# Patient Record
Sex: Male | Born: 1975
Health system: Southern US, Community
[De-identification: ages and names within clinical notes are randomized; demographics above are authoritative.]

## PROBLEM LIST (undated history)

## (undated) ENCOUNTER — Emergency Department (HOSPITAL_COMMUNITY): Admission: EM | Payer: 59 | Source: Home / Self Care

## (undated) DIAGNOSIS — C801 Malignant (primary) neoplasm, unspecified: Secondary | ICD-10-CM

## (undated) DIAGNOSIS — E119 Type 2 diabetes mellitus without complications: Secondary | ICD-10-CM

## (undated) HISTORY — DX: Type 2 diabetes mellitus without complications: E11.9

## (undated) HISTORY — PX: LEG SURGERY: SHX1003

## (undated) HISTORY — DX: Malignant (primary) neoplasm, unspecified: C80.1

---

## 2013-05-28 DIAGNOSIS — D1809 Hemangioma of other sites: Secondary | ICD-10-CM

## 2013-05-28 DIAGNOSIS — E119 Type 2 diabetes mellitus without complications: Secondary | ICD-10-CM

## 2013-05-28 HISTORY — DX: Hemangioma of other sites: D18.09

## 2013-05-28 HISTORY — DX: Type 2 diabetes mellitus without complications: E11.9

## 2014-01-03 ENCOUNTER — Emergency Department (HOSPITAL_COMMUNITY)
Admission: EM | Admit: 2014-01-03 | Discharge: 2014-01-04 | Disposition: A | Discharge status: Home / Self Care | Payer: 59 | Attending: Emergency Medicine | Admitting: Emergency Medicine

## 2014-01-03 ENCOUNTER — Encounter (HOSPITAL_COMMUNITY): Payer: Self-pay | Admitting: Emergency Medicine

## 2014-01-03 DIAGNOSIS — R200 Anesthesia of skin: Secondary | ICD-10-CM

## 2014-01-03 DIAGNOSIS — R209 Unspecified disturbances of skin sensation: Secondary | ICD-10-CM | POA: Diagnosis not present

## 2014-01-03 DIAGNOSIS — M545 Low back pain, unspecified: Secondary | ICD-10-CM

## 2014-01-03 LAB — CBC WITH DIFFERENTIAL/PLATELET
BASOS ABS: 0 10*3/uL (ref 0.0–0.1)
Basophils Relative: 0 % (ref 0–1)
EOS ABS: 0.1 10*3/uL (ref 0.0–0.7)
Eosinophils Relative: 1 % (ref 0–5)
HCT: 42 % (ref 39.0–52.0)
HEMOGLOBIN: 14.1 g/dL (ref 13.0–17.0)
LYMPHS PCT: 22 % (ref 12–46)
Lymphs Abs: 2.2 10*3/uL (ref 0.7–4.0)
MCH: 24.6 pg — ABNORMAL LOW (ref 26.0–34.0)
MCHC: 33.6 g/dL (ref 30.0–36.0)
MCV: 73.3 fL — ABNORMAL LOW (ref 78.0–100.0)
Monocytes Absolute: 0.5 10*3/uL (ref 0.1–1.0)
Monocytes Relative: 5 % (ref 3–12)
Neutro Abs: 7 10*3/uL (ref 1.7–7.7)
Neutrophils Relative %: 72 % (ref 43–77)
PLATELETS: 173 10*3/uL (ref 150–400)
RBC: 5.73 MIL/uL (ref 4.22–5.81)
RDW: 15 % (ref 11.5–15.5)
WBC: 9.8 10*3/uL (ref 4.0–10.5)

## 2014-01-03 LAB — COMPREHENSIVE METABOLIC PANEL
ALBUMIN: 4.1 g/dL (ref 3.5–5.2)
ALK PHOS: 57 U/L (ref 39–117)
ALT: 33 U/L (ref 0–53)
AST: 33 U/L (ref 0–37)
Anion gap: 14 (ref 5–15)
BUN: 11 mg/dL (ref 6–23)
CO2: 26 mEq/L (ref 19–32)
Calcium: 9.4 mg/dL (ref 8.4–10.5)
Chloride: 100 mEq/L (ref 96–112)
Creatinine, Ser: 0.94 mg/dL (ref 0.50–1.35)
GFR calc Af Amer: >90 mL/min (ref >90)
GFR calc non Af Amer: >90 mL/min (ref >90)
Glucose, Bld: 109 mg/dL — ABNORMAL HIGH (ref 70–99)
POTASSIUM: 4.6 meq/L (ref 3.7–5.3)
SODIUM: 140 meq/L (ref 137–147)
TOTAL PROTEIN: 8.3 g/dL (ref 6.0–8.3)
Total Bilirubin: 0.3 mg/dL (ref 0.3–1.2)

## 2014-01-03 NOTE — ED Notes (Signed)
Pt. reports low back pain and bilateral leg numbness for 2 weeks , denies injury / ambulatory . No urinary discomfort / no fever or chills.

## 2014-01-04 ENCOUNTER — Ambulatory Visit (HOSPITAL_COMMUNITY)
Admission: RE | Admit: 2014-01-04 | Discharge: 2014-01-04 | Disposition: A | Discharge status: Home / Self Care | Payer: 59 | Source: Ambulatory Visit | Attending: Emergency Medicine | Admitting: Emergency Medicine

## 2014-01-04 ENCOUNTER — Other Ambulatory Visit (HOSPITAL_COMMUNITY): Payer: Self-pay | Admitting: Emergency Medicine

## 2014-01-04 DIAGNOSIS — M545 Low back pain, unspecified: Secondary | ICD-10-CM | POA: Insufficient documentation

## 2014-01-04 DIAGNOSIS — R209 Unspecified disturbances of skin sensation: Secondary | ICD-10-CM | POA: Diagnosis not present

## 2014-01-04 MED ORDER — NAPROXEN 500 MG PO TABS: 500.0000 mg | ORAL_TABLET | Freq: Two times a day (BID) | ORAL | Status: DC

## 2014-01-04 NOTE — Discharge Instructions (Signed)
Back Pain: ° ° °Your back pain should be treated with medicines such as ibuprofen or aleve and this back pain should get better over the next 2 weeks.  However if you develop severe or worsening pain, low back pain with fever, numbness, weakness or inability to walk or urinate, you should return to the ER immediately.  Please follow up with your doctor this week for a recheck if still having symptoms. °Low back pain is discomfort in the lower back that may be due to injuries to muscles and ligaments around the spine.  Occasionally, it may be caused by a a problem to a part of the spine called a disc.  The pain may last several days or a week;  However, most patients get completely well in 4 weeks. ° °Self - care:  The application of heat can help soothe the pain.  Maintaining your daily activities, including walking, is encourged, as it will help you get better faster than just staying in bed. ° °Medications are also useful to help with pain control.  A commonly prescribed medications includes acetaminophen.  This medication is generally safe, though you should not take more than 8 of the extra strength (500mg) pills a day. ° °Non steroidal anti inflammatory medications including Ibuprofen and naproxen;  These medications help both pain and swelling and are very useful in treating back pain.  They should be taken with food, as they can cause stomach upset, and more seriously, stomach bleeding.   ° °Muscle relaxants:  These medications can help with muscle tightness that is a cause of lower back pain.  Most of these medications can cause drowsiness, and it is not safe to drive or use dangerous machinery while taking them. ° °You will need to follow up with  Your primary healthcare provider in 1-2 weeks for reassessment. ° °Be aware that if you develop new symptoms, such as a fever, leg weakness, difficulty with or loss of control of your urine or bowels, abdominal pain, or more severe pain, you will need to seek  medical attention and  / or return to the Emergency department. ° °If you do not have a doctor see the list below. ° °RESOURCE GUIDE ° °Chronic Pain Problems: °Contact [facility]  [phone] °Patients need to be referred by their primary care doctor. ° °Insufficient Money for Medicine: °Contact United Way:  call "211" or [facility] 271-5999. ° °No Primary Care Doctor: °- Call [facility]  832-8000 - can help you locate a primary care doctor that  accepts your insurance, provides certain services, etc. °- Physician Referral Service- 1-800-533-3463 ° °Agencies that provide inexpensive medical care: °- [facility]  832-8035 °- [facility]  832-7272 °- [facility]  [phone] °- [facility]  832-4777 °- [facility]  373-0678 °- [facility]  272-1050 ° °Medicaid-accepting [place] Providers: °- [facility]- [address] ° [phone], Mon-Fri 9am-7pm, Sat 9am-1pm °- [facility]- [address], [address] ° [phone] °- [facility]- [address], [address] ° [phone] °- [facility]- [address] [place] [address] ° [phone] °- [facility]- [address], [address], [phone] ° Only accepts [place] Medicaid patients after they have their name  applied to their card ° °Self Pay (no insurance) in [place]: °- Sickle Cell Patients: Dr Eric Dean, [facility] °   [address], [phone] °- [facility]- [address] ° [phone] °      -     [facility]- [address] °      -     [facility]- see information above (Speak to Pam H if you do not have insurance) °      -  [facility]- [address], [phone] °      -  [facility] [place]- [address],  [phone] °      -  [facility]- [address] [place] [address],  [phone] °      -  Dr Osei-Bonsu-  [address], [place], [phone] °      -  [facility]- [address], [phone] °      -  [facility]- [address] [place] [address], [phone], also [address], [phone] °      -    [facility]- [address], [phone], 1st & 3rd Saturday   every month, 10am-1pm ° °1) Find a Doctor and Pay Out of Pocket °Although you won't have to find out who is covered by your insurance plan, it is a good idea to ask around and get recommendations. You will then need to call the office and see if the doctor you have chosen will accept you as a new patient and what types of options they offer for patients who are self-pay. Some doctors offer discounts or will set up payment plans for their patients who do not have insurance, but you will need to ask so you aren't surprised when you get to your appointment. ° °2) Contact Your Local Health Department °Not all health departments have doctors that can see patients for sick visits, but many do, so it is worth a call to see if yours does. If you don't know where your local health department is, you can check in your phone book. The CDC also has a tool to help you locate your state's health department, and many state websites also have listings of all of their local health departments. ° °3) Find a Walk-in Clinic °If your illness is not likely to be very severe or complicated, you may want to try a walk in clinic. These are popping up all over the country in pharmacies, drugstores, and shopping centers. They're usually staffed by nurse practitioners or physician assistants that have been trained to treat common illnesses and complaints. They're usually fairly quick and inexpensive. However, if you have serious medical issues or chronic medical problems, these are probably not your best option ° °STD Testing °- [place] [facility] [place], [facility], [address], [place], phone [phone] or 1-877-539-9860.  Monday - Friday, call for an appointment. °- [place] [facility] [place], [facility], [address], [place], phone [phone] or 1-877-539-9860.  Monday - Friday, call for an appointment. ° °Abuse/Neglect: °- [place] Child Abuse Hotline (336) 641-3795 °- [place] Child Abuse Hotline 800-378-5315 (After Hours) ° °Emergency Shelter:  [facility] (336) 271-5985 ° °Maternity Homes: °- [facility] (336) 275-9566 °- [facility] (704) 372-4663 ° °MRSA Hotline #:   832-7006 ° °[place] Resources ° °[facility] [place]  [employer] [place] [facility] °[address]                 [address]         [address]  °[place]                                               [place]                              [place] °Phone:  349-3220                                  Phone:  [phone]                   Phone:  342-8140 ° °[facility], 342-8316 °- [place] [facility] - [facility]- 1-888-581-9988 °      -     [facility] in [place], [address],                                  336-349-4454, Insurance ° °[place] Child Abuse Hotline °(336) 342-1394 or (336) 342-3537 (After Hours) ° ° °Behavioral Health Services ° °Substance Abuse Resources: °- [facility]  336-882-2125 °- [facility] 336-784-9470 °- [facility] 336-285-9073 °- [facility] 336-845-3988 °- Residential & Outpatient Substance Abuse Program  800-659-3381 ° °Psychological Services: °- [facility]  832-9600 °- [facility]  378-7881 °- [facility], [address], [place], ACCESS LINE: 1-800-853-5163 or 336-641-4981, Http://www.[facility]/services/adult.htm ° °Dental Assistance ° °If unable to pay or  uninsured, contact:  [facility] or [facility] to become qualified for the adult dental clinic. ° °Patients with Medicaid: [facility] °[address], [phone] °[address], [phone] ° °If unable to pay, or uninsured, contact [facility] (271-5999) or [place] [facility] (641-3152 in [place], [phone] in [place]) to become qualified for the adult dental clinic ° °Other Low-Cost Community Dental Services: °- [facility]- [address], [place], [place], [address], 723-1848, Ext. 123, 2nd and 4th Thursday of the month at 6:30am.  10 clients each day by appointment, can sometimes see walk-in patients if someone does not show for an appointment. °- [facility]- [address], [place], [place], [address], [phone] °- [facility]- [address], [place], [place], [address], [phone] °- [facility]- 342-8273 °- [facility]- [phone] °- [facility]- [phone] ° ° ° ° ° ° °

## 2014-01-04 NOTE — ED Provider Notes (Signed)
CSN: [id number]     Arrival date & time 01/03/14  2113 History   First MD Initiated Contact with Patient 01/03/14 2349     Chief Complaint  Patient presents with  . Back Pain  . Numbness     (Consider location/radiation/quality/duration/timing/severity/associated sxs/prior Treatment) HPI Comments: 38 year old male, history of no past medical history, no past surgical history other than a remote leg surgery, does not smoke cigarettes. He has no history of IV drug use, no recent fevers, no history of cancer. He reports that over the last one or 2 weeks he has had the lower back tightness which becomes much worse when he gets up and walks and is associated with a gradual onset of bilateral numbness of the thighs anteriorly and medially which is persistent, nothing makes it better or worse including laying down and is not associated with weakness of the legs, urinary retention or incontinence or any other upper extremity or coordination problems. He has never had these symptoms before, nothing seems to make this better or worse, he has had a normal appetite with no abdominal distention, nausea, vomiting, fever, chills, cough, shortness of breath, diarrhea, constipation or blood in his stools. He has been seen at the urgent care for these symptoms, lab work done, patient given reassurance prior to his evaluation today. He does lift 50-75 pound boxes at work regularly, he does not recall an injury to his back  Patient is a 37 y.o. male presenting with back pain. The history is provided by the patient.  Back Pain   History reviewed. No pertinent past medical history. Past Surgical History  Procedure Laterality Date  . Leg surgery     No family history on file. History  Substance Use Topics  . Smoking status: Never Smoker   . Smokeless tobacco: Not on file  . Alcohol Use: Yes    Review of Systems  Musculoskeletal: Positive for back pain.  All other systems reviewed and are  negative.     Allergies  Review of patient's allergies indicates no known allergies.  Home Medications   Prior to Admission medications   Medication Sig Start Date End Date Taking? Authorizing Provider  naproxen (NAPROSYN) 500 MG tablet Take 1 tablet (500 mg total) by mouth 2 (two) times daily with a meal. 01/04/14   Johnna Acosta, MD   BP 137/96  Pulse 107  Temp(Src) 98.5 F (36.9 C) (Oral)  Resp 27  SpO2 98% Physical Exam  Nursing note and vitals reviewed. Constitutional: He appears well-developed and well-nourished. No distress.  HENT:  Head: Normocephalic and atraumatic.  Mouth/Throat: Oropharynx is clear and moist. No oropharyngeal exudate.  Eyes: Conjunctivae and EOM are normal. Pupils are equal, round, and reactive to light. Right eye exhibits no discharge. Left eye exhibits no discharge. No scleral icterus.  Neck: Normal range of motion. Neck supple. No JVD present. No thyromegaly present.  Cardiovascular: Normal rate, regular rhythm, normal heart sounds and intact distal pulses.  Exam reveals no gallop and no friction rub.   No murmur heard. Pulmonary/Chest: Effort normal and breath sounds normal. No respiratory distress. He has no wheezes. He has no rales.  Abdominal: Soft. Bowel sounds are normal. He exhibits no distension and no mass. There is no tenderness.  Musculoskeletal: Normal range of motion. He exhibits no edema and no tenderness.  Lymphadenopathy:    He has no cervical adenopathy.  Neurological: He is alert. Coordination normal.  Neurologic exam:  Speech clear, pupils equal round reactive to  light, extraocular movements intact  Normal peripheral visual fields Cranial nerves III through XII normal including no facial droop Follows commands, moves all extremities x4, normal strength to bilateral upper and lower extremities at all major muscle groups including grip Sensation normal to light touch and pinprick except over the proximal and mid thighs  bilaterally, normal sensation laterally and posteriorly, normal sensation below the knees, normal position sense Coordination intact, no limb ataxia,Rapid alternating movements normal No pronator drift Gait normal   Skin: Skin is warm and dry. No rash noted. No erythema.  Psychiatric: He has a normal mood and affect. His behavior is normal.    ED Course  Procedures (including critical care time) Labs Review Labs Reviewed  CBC WITH DIFFERENTIAL - Abnormal; Notable for the following:    MCV 73.3 (*)    MCH 24.6 (*)    All other components within normal limits  COMPREHENSIVE METABOLIC PANEL - Abnormal; Notable for the following:    Glucose, Bld 109 (*)    All other components within normal limits    Imaging Review No results found.    MDM   Final diagnoses:  Bilateral low back pain without sciatica  Bilateral leg numbness    The patient has no reducible tenderness over his back, he has not use IV drugs, he has normal exam of his lower extremities except for a component of numbness bilaterally. Would consider MRI of the back as an outpatient though emergent imaging is not indicated. The symptoms have been present for 2 weeks. Lab show no anemia, no rectal abnormalities, the patient can followup outpatient and has been given an MRI ordered for the morning and a referral for family doctors.  Pt expresses his understanding  Johnna Acosta, MD 01/04/14 [phone]

## 2014-01-12 ENCOUNTER — Telehealth (HOSPITAL_BASED_OUTPATIENT_CLINIC_OR_DEPARTMENT_OTHER): Payer: Self-pay | Admitting: Emergency Medicine

## 2014-01-22 ENCOUNTER — Institutional Professional Consult (permissible substitution): Payer: Self-pay | Admitting: Medical

## 2014-03-22 ENCOUNTER — Inpatient Hospital Stay (HOSPITAL_COMMUNITY)
Admission: AD | Admit: 2014-03-22 | Discharge: 2014-03-27 | DRG: 029 | Disposition: A | Discharge status: Home Health | Payer: 59 | Source: Ambulatory Visit | Attending: Internal Medicine | Admitting: Internal Medicine

## 2014-03-22 ENCOUNTER — Ambulatory Visit (INDEPENDENT_AMBULATORY_CARE_PROVIDER_SITE_OTHER): Payer: 59 | Admitting: Diagnostic Neuroimaging

## 2014-03-22 ENCOUNTER — Encounter: Payer: Self-pay | Admitting: Diagnostic Neuroimaging

## 2014-03-22 ENCOUNTER — Other Ambulatory Visit: Payer: Self-pay | Admitting: Diagnostic Neuroimaging

## 2014-03-22 ENCOUNTER — Encounter (HOSPITAL_COMMUNITY)
Admission: AD | Disposition: A | Discharge status: Home Health | Payer: Self-pay | Source: Ambulatory Visit | Attending: Internal Medicine

## 2014-03-22 ENCOUNTER — Encounter (HOSPITAL_COMMUNITY): Payer: 59 | Admitting: Certified Registered Nurse Anesthetist

## 2014-03-22 ENCOUNTER — Inpatient Hospital Stay (HOSPITAL_COMMUNITY): Payer: 59 | Admitting: Certified Registered Nurse Anesthetist

## 2014-03-22 VITALS — BP 130/87 | HR 112 | Temp 98.0°F | Resp 22 | Ht 68.0 in | Wt 210.0 lb

## 2014-03-22 DIAGNOSIS — R200 Anesthesia of skin: Secondary | ICD-10-CM

## 2014-03-22 DIAGNOSIS — R202 Paresthesia of skin: Secondary | ICD-10-CM

## 2014-03-22 DIAGNOSIS — C7949 Secondary malignant neoplasm of other parts of nervous system: Secondary | ICD-10-CM | POA: Diagnosis present

## 2014-03-22 DIAGNOSIS — D72829 Elevated white blood cell count, unspecified: Secondary | ICD-10-CM | POA: Diagnosis present

## 2014-03-22 DIAGNOSIS — Z803 Family history of malignant neoplasm of breast: Secondary | ICD-10-CM | POA: Diagnosis not present

## 2014-03-22 DIAGNOSIS — G893 Neoplasm related pain (acute) (chronic): Secondary | ICD-10-CM

## 2014-03-22 DIAGNOSIS — M6281 Muscle weakness (generalized): Secondary | ICD-10-CM | POA: Diagnosis present

## 2014-03-22 DIAGNOSIS — R739 Hyperglycemia, unspecified: Secondary | ICD-10-CM

## 2014-03-22 DIAGNOSIS — T380X5A Adverse effect of glucocorticoids and synthetic analogues, initial encounter: Secondary | ICD-10-CM | POA: Diagnosis present

## 2014-03-22 DIAGNOSIS — E119 Type 2 diabetes mellitus without complications: Secondary | ICD-10-CM | POA: Diagnosis present

## 2014-03-22 DIAGNOSIS — E669 Obesity, unspecified: Secondary | ICD-10-CM | POA: Diagnosis present

## 2014-03-22 DIAGNOSIS — E1169 Type 2 diabetes mellitus with other specified complication: Secondary | ICD-10-CM | POA: Diagnosis present

## 2014-03-22 DIAGNOSIS — M4714 Other spondylosis with myelopathy, thoracic region: Secondary | ICD-10-CM

## 2014-03-22 DIAGNOSIS — G992 Myelopathy in diseases classified elsewhere: Secondary | ICD-10-CM | POA: Diagnosis present

## 2014-03-22 DIAGNOSIS — Z6831 Body mass index (BMI) 31.0-31.9, adult: Secondary | ICD-10-CM

## 2014-03-22 DIAGNOSIS — D1809 Hemangioma of other sites: Secondary | ICD-10-CM | POA: Diagnosis present

## 2014-03-22 DIAGNOSIS — C801 Malignant (primary) neoplasm, unspecified: Secondary | ICD-10-CM

## 2014-03-22 DIAGNOSIS — D334 Benign neoplasm of spinal cord: Secondary | ICD-10-CM | POA: Diagnosis present

## 2014-03-22 DIAGNOSIS — E1165 Type 2 diabetes mellitus with hyperglycemia: Secondary | ICD-10-CM | POA: Diagnosis present

## 2014-03-22 DIAGNOSIS — R29898 Other symptoms and signs involving the musculoskeletal system: Secondary | ICD-10-CM | POA: Insufficient documentation

## 2014-03-22 DIAGNOSIS — IMO0002 Reserved for concepts with insufficient information to code with codable children: Secondary | ICD-10-CM

## 2014-03-22 DIAGNOSIS — G8220 Paraplegia, unspecified: Secondary | ICD-10-CM

## 2014-03-22 HISTORY — DX: Other spondylosis with myelopathy, thoracic region: M47.14

## 2014-03-22 HISTORY — DX: Paraplegia, unspecified: G82.20

## 2014-03-22 HISTORY — DX: Anesthesia of skin: R20.0

## 2014-03-22 HISTORY — PX: LAMINECTOMY: SHX219

## 2014-03-22 LAB — SURGICAL PCR SCREEN
MRSA, PCR: NEGATIVE
Staphylococcus aureus: NEGATIVE

## 2014-03-22 SURGERY — THORACIC LAMINECTOMY FOR TUMOR
Anesthesia: General | Laterality: Bilateral

## 2014-03-22 SURGERY — THORACIC LAMINECTOMY FOR TUMOR
Anesthesia: General | Site: Back

## 2014-03-22 MED ORDER — CEFAZOLIN SODIUM-DEXTROSE 2-3 GM-% IV SOLR
2.0000 g | INTRAVENOUS | Status: DC
  Filled 2014-03-22: qty 50

## 2014-03-22 MED ORDER — GLYCOPYRROLATE 0.2 MG/ML IJ SOLN
INTRAMUSCULAR | Status: AC
  Filled 2014-03-22: qty 3

## 2014-03-22 MED ORDER — ARTIFICIAL TEARS OP OINT
TOPICAL_OINTMENT | OPHTHALMIC | Status: AC
  Filled 2014-03-22: qty 3.5

## 2014-03-22 MED ORDER — ROCURONIUM BROMIDE 50 MG/5ML IV SOLN
INTRAVENOUS | Status: AC
  Filled 2014-03-22: qty 1

## 2014-03-22 MED ORDER — SUCCINYLCHOLINE CHLORIDE 20 MG/ML IJ SOLN
INTRAMUSCULAR | Status: AC
  Filled 2014-03-22: qty 1

## 2014-03-22 MED ORDER — FENTANYL CITRATE 0.05 MG/ML IJ SOLN
INTRAMUSCULAR | Status: AC
  Filled 2014-03-22: qty 5

## 2014-03-22 MED ORDER — DEXAMETHASONE SODIUM PHOSPHATE 10 MG/ML IJ SOLN
10.0000 mg | INTRAMUSCULAR | Status: DC
  Filled 2014-03-22: qty 1

## 2014-03-22 MED ORDER — PROPOFOL 10 MG/ML IV BOLUS
INTRAVENOUS | Status: AC
  Filled 2014-03-22: qty 20

## 2014-03-22 MED ORDER — MIDAZOLAM HCL 2 MG/2ML IJ SOLN
INTRAMUSCULAR | Status: AC
  Filled 2014-03-22: qty 2

## 2014-03-22 MED ORDER — NEOSTIGMINE METHYLSULFATE 10 MG/10ML IV SOLN
INTRAVENOUS | Status: AC
  Filled 2014-03-22: qty 1

## 2014-03-22 MED ORDER — ONDANSETRON HCL 4 MG/2ML IJ SOLN
INTRAMUSCULAR | Status: AC
  Filled 2014-03-22: qty 2

## 2014-03-22 SURGICAL SUPPLY — 71 items
BAG DECANTER FOR FLEXI CONT (MISCELLANEOUS) ×3 IMPLANT
BENZOIN TINCTURE PRP APPL 2/3 (GAUZE/BANDAGES/DRESSINGS) ×3 IMPLANT
BLADE CLIPPER SURG (BLADE) IMPLANT
BLADE SURG 11 STRL SS (BLADE) IMPLANT
BRUSH SCRUB EZ PLAIN DRY (MISCELLANEOUS) ×3 IMPLANT
BUR CUTTER 7.0 ROUND (BURR) ×3 IMPLANT
BUR MATCHSTICK NEURO 3.0 LAGG (BURR) ×3 IMPLANT
CANISTER SUCT 3000ML (MISCELLANEOUS) ×3 IMPLANT
CLOSURE WOUND 1/2 X4 (GAUZE/BANDAGES/DRESSINGS) ×1
CONT SPEC 4OZ CLIKSEAL STRL BL (MISCELLANEOUS) ×6 IMPLANT
DERMABOND ADVANCED (GAUZE/BANDAGES/DRESSINGS) ×2
DERMABOND ADVANCED .7 DNX12 (GAUZE/BANDAGES/DRESSINGS) ×1 IMPLANT
DRAPE LAPAROTOMY 100X72X124 (DRAPES) ×3 IMPLANT
DRAPE LAPAROTOMY T 102X78X121 (DRAPES) IMPLANT
DRAPE MICROSCOPE LEICA (MISCELLANEOUS) ×3 IMPLANT
DRAPE POUCH INSTRU U-SHP 10X18 (DRAPES) ×3 IMPLANT
DRAPE SURG 17X23 STRL (DRAPES) ×6 IMPLANT
DRSG OPSITE POSTOP 4X8 (GAUZE/BANDAGES/DRESSINGS) ×3 IMPLANT
ELECT REM PT RETURN 9FT ADLT (ELECTROSURGICAL) ×3
ELECTRODE REM PT RTRN 9FT ADLT (ELECTROSURGICAL) ×1 IMPLANT
GAUZE SPONGE 4X4 12PLY STRL (GAUZE/BANDAGES/DRESSINGS) ×3 IMPLANT
GAUZE SPONGE 4X4 16PLY XRAY LF (GAUZE/BANDAGES/DRESSINGS) IMPLANT
GLOVE BIO SURGEON STRL SZ 6.5 (GLOVE) ×4 IMPLANT
GLOVE BIO SURGEON STRL SZ7 (GLOVE) ×3 IMPLANT
GLOVE BIO SURGEON STRL SZ7.5 (GLOVE) ×6 IMPLANT
GLOVE BIO SURGEONS STRL SZ 6.5 (GLOVE) ×2
GLOVE ECLIPSE 9.0 STRL (GLOVE) ×3 IMPLANT
GLOVE EXAM NITRILE LRG STRL (GLOVE) IMPLANT
GLOVE EXAM NITRILE MD LF STRL (GLOVE) ×3 IMPLANT
GLOVE EXAM NITRILE XL STR (GLOVE) IMPLANT
GLOVE EXAM NITRILE XS STR PU (GLOVE) IMPLANT
GLOVE INDICATOR 6.5 STRL GRN (GLOVE) ×3 IMPLANT
GOWN STRL REUS W/ TWL LRG LVL3 (GOWN DISPOSABLE) ×2 IMPLANT
GOWN STRL REUS W/ TWL XL LVL3 (GOWN DISPOSABLE) ×1 IMPLANT
GOWN STRL REUS W/TWL 2XL LVL3 (GOWN DISPOSABLE) IMPLANT
GOWN STRL REUS W/TWL LRG LVL3 (GOWN DISPOSABLE) ×4
GOWN STRL REUS W/TWL XL LVL3 (GOWN DISPOSABLE) ×2
HEMOSTAT SURGICEL 2X14 (HEMOSTASIS) IMPLANT
KIT BASIN OR (CUSTOM PROCEDURE TRAY) ×3 IMPLANT
KIT ROOM TURNOVER OR (KITS) ×3 IMPLANT
NEEDLE HYPO 22GX1.5 SAFETY (NEEDLE) ×3 IMPLANT
NEEDLE HYPO 25X1 1.5 SAFETY (NEEDLE) ×3 IMPLANT
NEEDLE SPNL 20GX3.5 QUINCKE YW (NEEDLE) IMPLANT
NS IRRIG 1000ML POUR BTL (IV SOLUTION) ×3 IMPLANT
PACK LAMINECTOMY NEURO (CUSTOM PROCEDURE TRAY) ×3 IMPLANT
PAD EYE OVAL STERILE LF (GAUZE/BANDAGES/DRESSINGS) IMPLANT
PATTIES SURGICAL .5 X.5 (GAUZE/BANDAGES/DRESSINGS) IMPLANT
PATTIES SURGICAL .5 X3 (DISPOSABLE) ×3 IMPLANT
RUBBERBAND STERILE (MISCELLANEOUS) ×6 IMPLANT
SPONGE LAP 4X18 X RAY DECT (DISPOSABLE) ×3 IMPLANT
SPONGE SURGIFOAM ABS GEL 100 (HEMOSTASIS) ×3 IMPLANT
STAPLER VISISTAT 35W (STAPLE) IMPLANT
STRIP CLOSURE SKIN 1/2X4 (GAUZE/BANDAGES/DRESSINGS) ×2 IMPLANT
SUT BONE WAX W31G (SUTURE) IMPLANT
SUT ETHILON 4 0 PS 2 18 (SUTURE) ×3 IMPLANT
SUT NURALON 4 0 TR CR/8 (SUTURE) IMPLANT
SUT PROLENE 6 0 BV (SUTURE) IMPLANT
SUT VIC AB 0 CT1 18XCR BRD8 (SUTURE) ×2 IMPLANT
SUT VIC AB 0 CT1 8-18 (SUTURE) ×4
SUT VIC AB 2-0 CT1 18 (SUTURE) ×3 IMPLANT
SUT VIC AB 3-0 SH 8-18 (SUTURE) ×3 IMPLANT
SUT VICRYL 4-0 PS2 18IN ABS (SUTURE) IMPLANT
SYR 20ML ECCENTRIC (SYRINGE) IMPLANT
SYR CONTROL 10ML LL (SYRINGE) IMPLANT
TAPE STRIPS DRAPE STRL (GAUZE/BANDAGES/DRESSINGS) ×3 IMPLANT
TOWEL OR 17X24 6PK STRL BLUE (TOWEL DISPOSABLE) ×3 IMPLANT
TOWEL OR 17X26 10 PK STRL BLUE (TOWEL DISPOSABLE) ×3 IMPLANT
TRAY FOLEY CATH 14FRSI W/METER (CATHETERS) ×3 IMPLANT
TUBE CONNECTING 12'X1/4 (SUCTIONS)
TUBE CONNECTING 12X1/4 (SUCTIONS) IMPLANT
WATER STERILE IRR 1000ML POUR (IV SOLUTION) ×3 IMPLANT

## 2014-03-22 NOTE — Progress Notes (Addendum)
[facility]  PATIENT: Gary Conway DOB: 13-Oct-1975  REFERRING CLINICIAN: Dumonski HISTORY FROM: patient  REASON FOR VISIT: new consult    HISTORICAL  CHIEF COMPLAINT:  Chief Complaint  Patient presents with  . Establish Care    leg weakness    HISTORY OF PRESENT ILLNESS:   38 year old right-handed male here for evaluation of lower extremity weakness.  Beginning to mid August 2015 patient had onset of numbness and tingling from his mid thoracic region down to his legs. Within 1-2 weeks he had significant numbness and weakness in his lower extremities. His legs have been getting stiffer with more cramps, weakness, spasms. He is having significant difficulty with walking. Patient has been out of work since 01/26/2014. Patient went to the emergency room 8/101/15 and had MRI of the lumbar spine which was notable for mild degenerative changes. Patient followed up with orthopedic surgery, who tried epidural steroid injection, without relief. Patient referred to me for further evaluation of other etiologies of lower extremity weakness.  No bowel or bladder difficulty, upper extremity problems, headache, vision changes, slurred speech, facial numbness. No prodromal infections, vaccines, trauma. Symptoms are progressively worsening.   REVIEW OF SYSTEMS: Full 14 system review of systems performed and notable only for snoring cramps swelling in legs.  ALLERGIES: No Known Allergies  HOME MEDICATIONS: Outpatient Prescriptions Prior to Visit  Medication Sig Dispense Refill  . naproxen (NAPROSYN) 500 MG tablet Take 1 tablet (500 mg total) by mouth 2 (two) times daily with a meal.  30 tablet  0   No facility-administered medications prior to visit.    PAST MEDICAL HISTORY: History reviewed. No pertinent past medical history.  PAST SURGICAL HISTORY: Past Surgical History  Procedure Laterality Date  . Leg surgery      FAMILY HISTORY: Family History  Problem  Relation Age of Onset  . Breast cancer Mother     SOCIAL HISTORY:  History   Social History  . Marital Status: Single    Spouse Name: N/A    Number of Children: N/A  . Years of Education: N/A   Occupational History  . Not on file.   Social History Main Topics  . Smoking status: Never Smoker   . Smokeless tobacco: Not on file  . Alcohol Use: Yes  . Drug Use: No  . Sexual Activity: Not on file   Other Topics Concern  . Not on file   Social History Narrative  . No narrative on file     PHYSICAL EXAM  Filed Vitals:   03/22/14 0900  BP: 130/87  Pulse: 112  Temp: 98 F (36.7 C)  TempSrc: Oral  Resp: 22  Height: 5\' 8"  (1.727 m)  Weight: 210 lb (95.255 kg)    Not recorded   No exam data present   Body mass index is 31.94 kg/(m^2).  GENERAL EXAM: Patient is in no distress; well developed, nourished and groomed; neck is supple; NEG LHERMITTES  CARDIOVASCULAR: Regular rate and rhythm, no murmurs, no carotid bruits  NEUROLOGIC: MENTAL STATUS: awake, alert, oriented to person, place and time, recent and remote memory intact, normal attention and concentration, language fluent, comprehension intact, naming intact, fund of knowledge appropriate CRANIAL NERVE: no papilledema on fundoscopic exam, pupils equal and reactive to light, visual fields full to confrontation, extraocular muscles intact, no nystagmus, facial sensation and strength symmetric, hearing intact, palate elevates symmetrically, uvula midline, shoulder shrug symmetric, tongue midline. MOTOR: normal bulk and tone in BUE; BLE HAVE SIG INCREASED TOE (LEFT  WORSE THAN RIGHT) WITH SIG WEAKNESS (HF 3, KE 4, KF 3, DF RIGHT 3 LEFT 2).  SENSORY: T6 SENSORY LEVEL (DECR PP BELOW T6); DECR IN BLE TO ALL MODALITIES, LEFT LEG MORE AFFECT THAN RIGHT COORDINATION: finger-nose-finger, fine finger movements normal REFLEXES: BUE 2, KNEES 3+ WITH SUPRAPATELLAR AND LEFT CROSSED ADDUCTOR, ANKLES 2, RIGHT TOE UPGOING, LEFT UP  AND DOWN GOING GAIT/STATION: SPASTIC, PARAPARETIC GAIT    DIAGNOSTIC DATA (LABS, IMAGING, TESTING) - I reviewed patient records, labs, notes, testing and imaging myself where available.  Lab Results  Component Value Date   WBC 9.8 01/03/2014   HGB 14.1 01/03/2014   HCT 42.0 01/03/2014   MCV 73.3* 01/03/2014   PLT 173 01/03/2014      Component Value Date/Time   NA 140 01/03/2014 2152   K 4.6 01/03/2014 2152   CL 100 01/03/2014 2152   CO2 26 01/03/2014 2152   GLUCOSE 109* 01/03/2014 2152   BUN 11 01/03/2014 2152   CREATININE 0.94 01/03/2014 2152   CALCIUM 9.4 01/03/2014 2152   PROT 8.3 01/03/2014 2152   ALBUMIN 4.1 01/03/2014 2152   AST 33 01/03/2014 2152   ALT 33 01/03/2014 2152   ALKPHOS 57 01/03/2014 2152   BILITOT 0.3 01/03/2014 2152   GFRNONAA >90 01/03/2014 2152   GFRAA >90 01/03/2014 2152   No results found for this basename: CHOL, HDL, LDLCALC, LDLDIRECT, TRIG, CHOLHDL   No results found for this basename: HGBA1C   No results found for this basename: VITAMINB12   No results found for this basename: TSH    I reviewed images myself and agree with interpretation. Also there is mild biforaminal stenosis at L4-5 and L5-S1. -VRP  01/04/14 MRI lumbar spine - Mild degenerative disc disease at L4-5 with a mild broad-based disc osteophyte complex.    ASSESSMENT AND PLAN  38 y.o. year old male here with 2-3 months progressive numbness and weakness in legs (starting early August 2015), with T6 sensory level. Exam shows increased tone in legs, paraplegia, hyperreflexia, and T6 sensory level. Suspect upper/mid thoracic cord lesion.  Localization: upper thoracic myelopathy  Ddx: CNS autoimmune/inflamm, demyelinating, compressive  PLAN: - stat MRI studies (MRI thoracic; if abnl, then also cervical and brain studies) - possible IV steroids pending results - will consider hospital admission due to severe weakness  Orders Placed This Encounter  Procedures  . MR Brain W Wo Contrast  . MR Cervical Spine W Wo  Contrast  . MR Thoracic Spine W Wo Contrast   Return later this week after MRI scans.  I reviewed images, labs, notes, records myself. I summarized findings and reviewed with patient, for this high risk condition (suspected thoracic myelopathy; transverse myelitis vs compressive lesion ) requiring high complexity decision making. I setup STAT MRI studies and will follow up.   Penni Bombard, MD 02/54/2706, 23:76 AM Certified in Neurology, Neurophysiology and Neuroimaging  [facility] [address] [place], [place] [address] [phone]    ADDENDUM:  I reviewed MRI imaging myself. See reports in EPIC: "At T3 level, there is an isolated, abnormal vertebral body lesion (with enhancement and edema) with extension along the right pedicle and epidural extension encircling the spinal cord. There is resultant severe spinal cord compression (with mild cord edema). Findings concerning for a neoplastic (likely metastatic) process. Infectious process considered but less likely." MRI brain and cervical spine unremarkable.   I spoke with Dr. Annette Stable (Neurosurgery on call) and he agreed with plan. I spoke  with Dr. Shanon Brow who kindly agreed to admit to [facility] service. Patient will need primary malignancy workup (such as CT chest/abdomen/pelvis) and IV steroids. Dr. Annette Stable will see patient and determine need for surgical decompression. Finally I spoke with patient and his girlfriend about hte plan. They have copies of the MRI imaging and will bring to the hospital.    Penni Bombard, MD 99/24/2683, 4:19 PM Certified in Neurology, Neurophysiology and Neuroimaging  [facility] [address] [place], [place] [address] [phone]

## 2014-03-22 NOTE — Anesthesia Preprocedure Evaluation (Signed)
Anesthesia Evaluation  Patient identified by MRN, date of birth, ID band Patient awake    Reviewed: Allergy & Precautions, H&P , NPO status , Patient's Chart, lab work & pertinent test results, Unable to perform ROS - Chart review onlyPreop documentation limited or incomplete due to emergent nature of procedure.  Airway Mallampati: II       Dental  (+) Teeth Intact, Loose,    Pulmonary  breath sounds clear to auscultation        Cardiovascular Rhythm:Regular Rate:Normal     Neuro/Psych    GI/Hepatic   Endo/Other    Renal/GU      Musculoskeletal   Abdominal   Peds  Hematology   Anesthesia Other Findings   Reproductive/Obstetrics                             Anesthesia Physical Anesthesia Plan  ASA: IV and emergent  Anesthesia Plan: General   Post-op Pain Management:    Induction: Intravenous  Airway Management Planned: Oral ETT  Additional Equipment: Arterial line  Intra-op Plan:   Post-operative Plan: Extubation in OR  Informed Consent: I have reviewed the patients History and Physical, chart, labs and discussed the procedure including the risks, benefits and alternatives for the proposed anesthesia with the patient or authorized representative who has indicated his/her understanding and acceptance.   Dental advisory given  Plan Discussed with: CRNA and Anesthesiologist  Anesthesia Plan Comments:         Anesthesia Quick Evaluation

## 2014-03-22 NOTE — Consult Note (Addendum)
Reason for Consult: Thoracic 3 neoplasm with compressive myelopathy Referring Physician: Hospitalist  Gary Conway is an 38 y.o. male.  HPI: A 38 year old male otherwise healthy with a 2 month history of progressive bilateral lower extremity weakness, sensory loss, and incoordination. No history of trauma. No history of malignancy. No history of infectious etiology. Patient with minimal if any pain. Patient has become increasingly bothered by lower extremity spasms and incoordination. He has essentially become nonambulatory independently but still can walk a little bit with a walker as an aide. He denies bowel or bladder dysfunction. He has relative sensory loss from his chest distally.  No past medical history on file.  Past Surgical History  Procedure Laterality Date  . Leg surgery      Family History  Problem Relation Age of Onset  . Breast cancer Mother     Social History:  reports that he has never smoked. He does not have any smokeless tobacco history on file. He reports that he drinks alcohol. He reports that he does not use illicit drugs.  Allergies: No Known Allergies  Medications: I have reviewed the patient's current medications.  No results found for this or any previous visit (from the past 48 hour(s)).  Mr Gary Conway UU Contrast  03/22/2014   [facility]  NEUROIMAGING REPORT   STUDY DATE: 03/22/14 PATIENT NAME: Gary Conway DOB: February 27, 1976 MRN: [id number]  ORDERING CLINICIAN: Andrey Spearman, MD  CLINICAL HISTORY: 38 year old male with lower extremity weakness and hyperreflexia.  EXAM: MRI brain (with and without)  TECHNIQUE: MRI of the brain with and without contrast was obtained utilizing 5 mm axial slices with T1, T2, T2 flair, T2 star gradient echo and diffusion weighted views.  T1 sagittal, T2 coronal and postcontrast views in the axial and coronal plane were obtained. CONTRAST: 29ml gadavist IMAGING SITE: [place] (1.2 Tesla  MRI)   FINDINGS:  No abnormal lesions are seen on diffusion-weighted views to suggest acute ischemia. The cortical sulci, fissures and cisterns are normal in size and appearance. Lateral, third and fourth ventricle are normal in size and appearance. No extra-axial fluid collections are seen. No evidence of mass effect or midline shift.    No abnormal lesions are seen on post contrast views.  On sagittal views the posterior fossa, pituitary gland and corpus callosum are unremarkable. No evidence of intracranial hemorrhage on gradient-echo views. The orbits and their contents, paranasal sinuses and calvarium are unremarkable.  Intracranial flow voids are present.   IMPRESSION:  Normal MRI brain (with and without).     INTERPRETING PHYSICIAN:  Gary Bombard, MD Certified in Neurology, Neurophysiology and Neuroimaging  [facility] [address], [place], [place] [address] [phone]   Mr Cervical Spine Gary Conway Contrast  03/22/2014   [facility]  NEUROIMAGING REPORT   STUDY DATE: 03/22/14 PATIENT NAME: Gary Conway DOB: July 19, 1975 MRN: [id number]  ORDERING CLINICIAN: Andrey Spearman, MD  CLINICAL HISTORY: 38 year old male with lower extremity weakness and hyperreflexia.   EXAM: MRI cervical spine (with and without)  TECHNIQUE: MRI of the cervical spine was obtained utilizing 3 mm sagittal slices from the posterior fossa down to the T3-4 level with T1, T2 and inversion recovery views. In addition 4 mm axial slices from Z6-6 down to T1-2 level were included with T2 and gradient echo views. CONTRAST: 19ml gadavist  IMAGING SITE: [place] (1.2 Tesla MRI)   FINDINGS:  On sagittal views the vertebral  bodies have normal height and alignment.  Again noted is a T3 vertebral body lesion (with edema and enhancement) as noted on MRI thoracic spine study from same day. The spinal cord is notable for severe cord compression at T3 with spinal cord edema at T2-T3.  Remainder of visualized spinal cord is normal in size and appearance. The posterior fossa, pituitary gland and paraspinal soft tissues are unremarkable.    On axial views there is no spinal stenosis or foraminal narrowing. The T3 level is not visualized on this study, but is seen on corresponding thoracic MRI from same day. Limited views of the soft tissues of the head and neck are unremarkable. No abnormal enhancing lesions (except for T3 level abnormalities).    IMPRESSION:  Abnormal MRI cervical spine (with and without) demonstrating: 1. Again seen is the abnormal T3 vertebral body lesion (with edema and enhancement) with resultant severe cord compression at T3 with spinal cord edema at T2-T3. Suspicious for neoplastic (metastatic) etiology. See MRI thoracic spine for further details.  2. The remaining cervical spine levels are unremarkable.     INTERPRETING PHYSICIAN:  Gary Bombard, MD Certified in Neurology, Neurophysiology and Neuroimaging  [facility] [address] [place], [place] [address] [phone]   Mr Thoracic Spine Gary Conway Contrast  03/22/2014   [facility]  NEUROIMAGING REPORT   STUDY DATE: 03/22/14 PATIENT NAME: Gary Conway DOB: 1975/09/10 MRN: [id number]  ORDERING CLINICIAN: Andrey Spearman, MD  CLINICAL HISTORY: 38 year old male with lower extremity weakness, numbness, hyperreflexia.  EXAM: MRI thoracic spine (with and without)  TECHNIQUE: MRI of the thoracic spine was obtained utilizing 3 mm sagittal slices from G3-8 down to the L1-2 level with T1, T2 and inversion recovery views. In addition 4 mm axial slices from V5-I4 down to T12-L1 level were included with T1, T2 and gradient echo views.   CONTRAST: 11ml gadavist  IMAGING SITE: [place] (1.2 Tesla MRI)   FINDINGS:  On sagittal views the vertebral bodies have normal height and alignment.    There is abnormal T2 and STIR hyperintense signal with T3 vertebral body,  extending in the right pedicle. This hypointense on T1 with avid enhancement. There is extension of abnormal enhancement encircling the spinal cord at this level, resulting in severe cord compression. There is subtle increased T2 hyperintense signal within the spinal cord at this level (especially on sagittal T2 and STIR views) suggesting spinal cord edema. Findings concerning for a neoplastic (metastatic) process. Infectious process considered but less likely.   Elsewhere the spinal cord is normal in size and appearance. The paraspinal soft tissues are unremarkable.    On axial views again demonstrating at T3 level, the enhancing vertebral body abnormality with adjacent severe spinal cord compression. At other levels, there is no spinal stenosis or foraminal narrowing. Limited views of the aorta, kidneys, liver, lungs and paraspinal muscles are unremarkable.   IMPRESSION:  Abnormal MRI thoracic spine (with and without) demonstrating: 1. At T3 level, there is an isolated, abnormal vertebral body lesion (with enhancement and edema) with extension along the right pedicle and epidural extension encircling the spinal cord. There is resultant severe spinal cord compression (with mild cord edema). Findings concerning for a neoplastic (likely metastatic) process. Infectious process considered but less likely.  2. Remaining thoracic spine levels unremarkable.     INTERPRETING PHYSICIAN:  Gary Bombard, MD Certified in Neurology, Neurophysiology and Neuroimaging  [facility] [address], [address]  [place], [place] [address] [phone]     Blood pressure 156/105, pulse 123, temperature 98.9 F (37.2 C), temperature source Oral, resp. rate 20, height 5\' 8"  (1.727 m), weight 94.3 kg (207 lb 14.3 oz), SpO2 97.00%. the patient is awake and alert. He is oriented and appropriate. Cranial nerve function is intact. Speech is fluent. Upper extremity strength 5/5 bilaterally. Lower extremity strength  with marked increased tone left greater than right. Right lower extremity strength 4/5. Left lower extremity strength 4 minus/5. Toes upgoing to plantar stimulation. Patient normal reflexive in both upper extremities.hyperreflexia with clonus both lower extremities. Examination head ears eyes and throat is unremarkable neck is supple. Chest is clear. Abdomen benign.  Assessment/Plan: unusual T3 neoplastic lesion in an otherwise healthy 38 year old male. Patient with a significant thoracic myelopathy secondary to epidural tumor spread and compression. I have recommended the patient undergo urgent decompressive surgery both for hopeful preservation and/or improvement of neurologic function and establishment of diagnosis of tumor type. I discussed the risks and benefits involved with surgery including but not limited to the risk of anesthesia, bleeding, infection, CSF leak, nerve root injury, spinal cord injury, later and stability, tumor recurrence, and non-benefit. The patient is aware the risks and wishes to proceed. We will proceed urgently tonight Kirtis Challis A 03/22/2014, 9:46 PM

## 2014-03-22 NOTE — Patient Instructions (Signed)
I will check MRI studies.

## 2014-03-23 ENCOUNTER — Inpatient Hospital Stay (HOSPITAL_COMMUNITY): Payer: 59

## 2014-03-23 ENCOUNTER — Encounter (HOSPITAL_COMMUNITY): Payer: Self-pay | Admitting: *Deleted

## 2014-03-23 DIAGNOSIS — D72829 Elevated white blood cell count, unspecified: Secondary | ICD-10-CM | POA: Diagnosis present

## 2014-03-23 DIAGNOSIS — C7949 Secondary malignant neoplasm of other parts of nervous system: Principal | ICD-10-CM

## 2014-03-23 DIAGNOSIS — R739 Hyperglycemia, unspecified: Secondary | ICD-10-CM

## 2014-03-23 DIAGNOSIS — D1809 Hemangioma of other sites: Secondary | ICD-10-CM | POA: Diagnosis present

## 2014-03-23 DIAGNOSIS — R202 Paresthesia of skin: Secondary | ICD-10-CM

## 2014-03-23 DIAGNOSIS — G893 Neoplasm related pain (acute) (chronic): Secondary | ICD-10-CM

## 2014-03-23 DIAGNOSIS — C801 Malignant (primary) neoplasm, unspecified: Secondary | ICD-10-CM

## 2014-03-23 HISTORY — DX: Hemangioma of other sites: D18.09

## 2014-03-23 LAB — URINALYSIS, ROUTINE W REFLEX MICROSCOPIC
BILIRUBIN URINE: NEGATIVE
GLUCOSE, UA: 100 mg/dL — AB
Ketones, ur: NEGATIVE mg/dL
Nitrite: NEGATIVE
PROTEIN: NEGATIVE mg/dL
SPECIFIC GRAVITY, URINE: 1.015 (ref 1.005–1.030)
UROBILINOGEN UA: 0.2 mg/dL (ref 0.0–1.0)
pH: 6 (ref 5.0–8.0)

## 2014-03-23 LAB — CBC WITH DIFFERENTIAL/PLATELET
BASOS ABS: 0 10*3/uL (ref 0.0–0.1)
Basophils Relative: 0 % (ref 0–1)
EOS PCT: 0 % (ref 0–5)
Eosinophils Absolute: 0 10*3/uL (ref 0.0–0.7)
HCT: 37.6 % — ABNORMAL LOW (ref 39.0–52.0)
HEMOGLOBIN: 13.1 g/dL (ref 13.0–17.0)
LYMPHS ABS: 0.5 10*3/uL — AB (ref 0.7–4.0)
Lymphocytes Relative: 4 % — ABNORMAL LOW (ref 12–46)
MCH: 26.1 pg (ref 26.0–34.0)
MCHC: 34.8 g/dL (ref 30.0–36.0)
MCV: 74.9 fL — ABNORMAL LOW (ref 78.0–100.0)
MONOS PCT: 1 % — AB (ref 3–12)
Monocytes Absolute: 0.2 10*3/uL (ref 0.1–1.0)
NEUTROS ABS: 11.5 10*3/uL — AB (ref 1.7–7.7)
Neutrophils Relative %: 95 % — ABNORMAL HIGH (ref 43–77)
Platelets: 141 10*3/uL — ABNORMAL LOW (ref 150–400)
RBC: 5.02 MIL/uL (ref 4.22–5.81)
RDW: 15.6 % — ABNORMAL HIGH (ref 11.5–15.5)
WBC: 12.2 10*3/uL — AB (ref 4.0–10.5)

## 2014-03-23 LAB — COMPREHENSIVE METABOLIC PANEL
ALT: 25 U/L (ref 0–53)
ANION GAP: 14 (ref 5–15)
AST: 23 U/L (ref 0–37)
Albumin: 3.6 g/dL (ref 3.5–5.2)
Alkaline Phosphatase: 47 U/L (ref 39–117)
BILIRUBIN TOTAL: 0.5 mg/dL (ref 0.3–1.2)
BUN: 11 mg/dL (ref 6–23)
CHLORIDE: 99 meq/L (ref 96–112)
CO2: 23 mEq/L (ref 19–32)
CREATININE: 0.92 mg/dL (ref 0.50–1.35)
Calcium: 8.7 mg/dL (ref 8.4–10.5)
GFR calc Af Amer: >90 mL/min (ref >90)
GFR calc non Af Amer: >90 mL/min (ref >90)
Glucose, Bld: 252 mg/dL — ABNORMAL HIGH (ref 70–99)
POTASSIUM: 5.1 meq/L (ref 3.7–5.3)
Sodium: 136 mEq/L — ABNORMAL LOW (ref 137–147)
TOTAL PROTEIN: 6.9 g/dL (ref 6.0–8.3)

## 2014-03-23 LAB — MAGNESIUM: Magnesium: 1.8 mg/dL (ref 1.5–2.5)

## 2014-03-23 LAB — HEMOGLOBIN A1C
Hgb A1c MFr Bld: 7 % — ABNORMAL HIGH (ref <5.7)
MEAN PLASMA GLUCOSE: 154 mg/dL — AB (ref <117)

## 2014-03-23 LAB — IRON AND TIBC
IRON: 171 ug/dL — AB (ref 42–135)
SATURATION RATIOS: 63 % — AB (ref 20–55)
TIBC: 271 ug/dL (ref 215–435)
UIBC: 100 ug/dL — AB (ref 125–400)

## 2014-03-23 LAB — GLUCOSE, CAPILLARY
Glucose-Capillary: 221 mg/dL — ABNORMAL HIGH (ref 70–99)
Glucose-Capillary: 229 mg/dL — ABNORMAL HIGH (ref 70–99)
Glucose-Capillary: 297 mg/dL — ABNORMAL HIGH (ref 70–99)

## 2014-03-23 LAB — POCT I-STAT 4, (NA,K, GLUC, HGB,HCT)
Glucose, Bld: 151 mg/dL — ABNORMAL HIGH (ref 70–99)
HEMATOCRIT: 26 % — AB (ref 39.0–52.0)
Hemoglobin: 8.8 g/dL — ABNORMAL LOW (ref 13.0–17.0)
Potassium: 3.8 mEq/L (ref 3.7–5.3)
Sodium: 139 mEq/L (ref 137–147)

## 2014-03-23 LAB — URINE MICROSCOPIC-ADD ON

## 2014-03-23 LAB — ABO/RH: ABO/RH(D): O POS

## 2014-03-23 LAB — BLOOD PRODUCT ORDER (VERBAL) VERIFICATION

## 2014-03-23 LAB — FERRITIN: FERRITIN: 84 ng/mL (ref 22–322)

## 2014-03-23 LAB — VITAMIN B12: Vitamin B-12: 382 pg/mL (ref 211–911)

## 2014-03-23 LAB — FOLATE: FOLATE: 18 ng/mL

## 2014-03-23 MED ORDER — SODIUM CHLORIDE 0.9 % IV SOLN
INTRAVENOUS | Status: DC | PRN
  Administered 2014-03-22 – 2014-03-23 (×2): via INTRAVENOUS

## 2014-03-23 MED ORDER — GLYCOPYRROLATE 0.2 MG/ML IJ SOLN
INTRAMUSCULAR | Status: AC
  Filled 2014-03-23: qty 3

## 2014-03-23 MED ORDER — HYDROCODONE-ACETAMINOPHEN 5-325 MG PO TABS: 1.0000 | ORAL_TABLET | ORAL | Status: DC | PRN

## 2014-03-23 MED ORDER — SODIUM CHLORIDE 0.9 % IJ SOLN
3.0000 mL | INTRAMUSCULAR | Status: DC | PRN
  Administered 2014-03-24: 3 mL via INTRAVENOUS

## 2014-03-23 MED ORDER — SODIUM CHLORIDE 0.9 % IR SOLN
Status: DC | PRN
  Administered 2014-03-22

## 2014-03-23 MED ORDER — FENTANYL CITRATE 0.05 MG/ML IJ SOLN
INTRAMUSCULAR | Status: AC
  Filled 2014-03-23: qty 5

## 2014-03-23 MED ORDER — HYDROMORPHONE HCL 1 MG/ML IJ SOLN
0.2500 mg | INTRAMUSCULAR | Status: DC | PRN
  Administered 2014-03-23 (×4): 0.5 mg via INTRAVENOUS

## 2014-03-23 MED ORDER — GLYCOPYRROLATE 0.2 MG/ML IJ SOLN
INTRAMUSCULAR | Status: DC | PRN
  Administered 2014-03-23: .7 mg via INTRAVENOUS

## 2014-03-23 MED ORDER — BISACODYL 10 MG RE SUPP
10.0000 mg | Freq: Every day | RECTAL | Status: DC | PRN
  Filled 2014-03-23: qty 1

## 2014-03-23 MED ORDER — FLEET ENEMA 7-19 GM/118ML RE ENEM
1.0000 | ENEMA | Freq: Once | RECTAL | Status: AC | PRN
  Filled 2014-03-23: qty 1

## 2014-03-23 MED ORDER — ACETAMINOPHEN 325 MG PO TABS: 650.0000 mg | ORAL_TABLET | ORAL | Status: DC | PRN

## 2014-03-23 MED ORDER — ALUM & MAG HYDROXIDE-SIMETH 200-200-20 MG/5ML PO SUSP
30.0000 mL | Freq: Four times a day (QID) | ORAL | Status: DC | PRN
Start: 2014-03-23 — End: 2014-03-27
  Administered 2014-03-24: 30 mL via ORAL
  Filled 2014-03-23: qty 30

## 2014-03-23 MED ORDER — DEXAMETHASONE SODIUM PHOSPHATE 4 MG/ML IJ SOLN
INTRAMUSCULAR | Status: DC | PRN
Start: 2014-03-22 — End: 2014-03-23
  Administered 2014-03-22: 10 mg via INTRAVENOUS

## 2014-03-23 MED ORDER — HYDROMORPHONE HCL 1 MG/ML IJ SOLN
INTRAMUSCULAR | Status: AC
  Filled 2014-03-23: qty 1

## 2014-03-23 MED ORDER — ALBUMIN HUMAN 5 % IV SOLN
INTRAVENOUS | Status: DC | PRN
  Administered 2014-03-23: 01:00:00 via INTRAVENOUS

## 2014-03-23 MED ORDER — OXYCODONE-ACETAMINOPHEN 5-325 MG PO TABS: 1.0000 | ORAL_TABLET | ORAL | Status: DC | PRN

## 2014-03-23 MED ORDER — DEXAMETHASONE SODIUM PHOSPHATE 4 MG/ML IJ SOLN
4.0000 mg | Freq: Four times a day (QID) | INTRAMUSCULAR | Status: DC
  Administered 2014-03-23 – 2014-03-26 (×7): 4 mg via INTRAVENOUS
  Filled 2014-03-23 (×17): qty 1

## 2014-03-23 MED ORDER — SODIUM CHLORIDE 0.9 % IJ SOLN
3.0000 mL | Freq: Two times a day (BID) | INTRAMUSCULAR | Status: DC
  Administered 2014-03-23 – 2014-03-26 (×7): 3 mL via INTRAVENOUS

## 2014-03-23 MED ORDER — 0.9 % SODIUM CHLORIDE (POUR BTL) OPTIME
TOPICAL | Status: DC | PRN
  Administered 2014-03-22: 1000 mL

## 2014-03-23 MED ORDER — PANTOPRAZOLE SODIUM 40 MG PO TBEC
40.0000 mg | DELAYED_RELEASE_TABLET | Freq: Every day | ORAL | Status: DC
  Administered 2014-03-23 – 2014-03-27 (×5): 40 mg via ORAL
  Filled 2014-03-23 (×5): qty 1

## 2014-03-23 MED ORDER — ROCURONIUM BROMIDE 100 MG/10ML IV SOLN
INTRAVENOUS | Status: DC | PRN
  Administered 2014-03-22: 50 mg via INTRAVENOUS
  Administered 2014-03-23 (×4): 10 mg via INTRAVENOUS

## 2014-03-23 MED ORDER — MIDAZOLAM HCL 5 MG/5ML IJ SOLN
INTRAMUSCULAR | Status: DC | PRN
  Administered 2014-03-22: 2 mg via INTRAVENOUS

## 2014-03-23 MED ORDER — ONDANSETRON HCL 4 MG/2ML IJ SOLN: 4.0000 mg | Freq: Once | INTRAMUSCULAR | Status: DC | PRN

## 2014-03-23 MED ORDER — LACTATED RINGERS IV SOLN
INTRAVENOUS | Status: DC | PRN
  Administered 2014-03-22 – 2014-03-23 (×2): via INTRAVENOUS

## 2014-03-23 MED ORDER — PROPOFOL 10 MG/ML IV BOLUS
INTRAVENOUS | Status: DC | PRN
  Administered 2014-03-22: 100 mg via INTRAVENOUS
  Administered 2014-03-22: 40 mg via INTRAVENOUS
  Administered 2014-03-22: 200 mg via INTRAVENOUS

## 2014-03-23 MED ORDER — ONDANSETRON HCL 4 MG/2ML IJ SOLN
4.0000 mg | INTRAMUSCULAR | Status: DC | PRN
  Administered 2014-03-23 (×3): 4 mg via INTRAVENOUS
  Filled 2014-03-23 (×3): qty 2

## 2014-03-23 MED ORDER — ARTIFICIAL TEARS OP OINT
TOPICAL_OINTMENT | OPHTHALMIC | Status: DC | PRN
Start: 2014-03-22 — End: 2014-03-23
  Administered 2014-03-22: 1 via OPHTHALMIC

## 2014-03-23 MED ORDER — THROMBIN 5000 UNITS EX SOLR
OROMUCOSAL | Status: DC | PRN
  Administered 2014-03-23 (×2): via TOPICAL

## 2014-03-23 MED ORDER — BUPIVACAINE HCL (PF) 0.25 % IJ SOLN
INTRAMUSCULAR | Status: DC | PRN
  Administered 2014-03-23: 20 mL

## 2014-03-23 MED ORDER — DEXAMETHASONE 4 MG PO TABS
4.0000 mg | ORAL_TABLET | Freq: Four times a day (QID) | ORAL | Status: DC
  Administered 2014-03-23 – 2014-03-26 (×7): 4 mg via ORAL
  Filled 2014-03-23 (×17): qty 1

## 2014-03-23 MED ORDER — LIDOCAINE HCL (CARDIAC) 20 MG/ML IV SOLN
INTRAVENOUS | Status: DC | PRN
  Administered 2014-03-22: 50 mg via INTRAVENOUS

## 2014-03-23 MED ORDER — FENTANYL CITRATE 0.05 MG/ML IJ SOLN
INTRAMUSCULAR | Status: DC | PRN
  Administered 2014-03-22 (×5): 50 ug via INTRAVENOUS
  Administered 2014-03-22: 100 ug via INTRAVENOUS
  Administered 2014-03-23 (×3): 50 ug via INTRAVENOUS

## 2014-03-23 MED ORDER — ONDANSETRON HCL 4 MG/2ML IJ SOLN
INTRAMUSCULAR | Status: DC | PRN
  Administered 2014-03-23: 4 mg via INTRAVENOUS

## 2014-03-23 MED ORDER — CEFAZOLIN SODIUM-DEXTROSE 2-3 GM-% IV SOLR
INTRAVENOUS | Status: DC | PRN
  Administered 2014-03-22: 2 g via INTRAVENOUS

## 2014-03-23 MED ORDER — PHENOL 1.4 % MT LIQD: 1.0000 | OROMUCOSAL | Status: DC | PRN

## 2014-03-23 MED ORDER — NEOSTIGMINE METHYLSULFATE 10 MG/10ML IV SOLN
INTRAVENOUS | Status: AC
  Filled 2014-03-23: qty 1

## 2014-03-23 MED ORDER — MENTHOL 3 MG MT LOZG: 1.0000 | LOZENGE | OROMUCOSAL | Status: DC | PRN

## 2014-03-23 MED ORDER — SODIUM CHLORIDE 0.9 % IV SOLN
250.0000 mL | INTRAVENOUS | Status: DC
  Administered 2014-03-23: 250 mL via INTRAVENOUS

## 2014-03-23 MED ORDER — INSULIN ASPART 100 UNIT/ML ~~LOC~~ SOLN
0.0000 [IU] | Freq: Three times a day (TID) | SUBCUTANEOUS | Status: DC
  Administered 2014-03-23 (×2): 7 [IU] via SUBCUTANEOUS
  Administered 2014-03-24: 15 [IU] via SUBCUTANEOUS

## 2014-03-23 MED ORDER — METOPROLOL TARTRATE 1 MG/ML IV SOLN
INTRAVENOUS | Status: DC | PRN
Start: 2014-03-22 — End: 2014-03-23
  Administered 2014-03-22: 2 mg via INTRAVENOUS

## 2014-03-23 MED ORDER — KETOROLAC TROMETHAMINE 30 MG/ML IJ SOLN
30.0000 mg | Freq: Four times a day (QID) | INTRAMUSCULAR | Status: DC
  Administered 2014-03-23 – 2014-03-27 (×17): 30 mg via INTRAVENOUS
  Filled 2014-03-23 (×26): qty 1

## 2014-03-23 MED ORDER — HYDROMORPHONE HCL 1 MG/ML IJ SOLN: 0.5000 mg | INTRAMUSCULAR | Status: DC | PRN

## 2014-03-23 MED ORDER — IOHEXOL 300 MG/ML  SOLN
25.0000 mL | INTRAMUSCULAR | Status: AC
  Administered 2014-03-23 (×2): 25 mL via ORAL

## 2014-03-23 MED ORDER — THROMBIN 20000 UNITS EX SOLR
CUTANEOUS | Status: DC | PRN
  Administered 2014-03-22: via TOPICAL

## 2014-03-23 MED ORDER — CEFAZOLIN SODIUM 1-5 GM-% IV SOLN
1.0000 g | Freq: Three times a day (TID) | INTRAVENOUS | Status: AC
  Administered 2014-03-23 (×2): 1 g via INTRAVENOUS
  Filled 2014-03-23 (×3): qty 50

## 2014-03-23 MED ORDER — ACETAMINOPHEN 650 MG RE SUPP: 650.0000 mg | RECTAL | Status: DC | PRN

## 2014-03-23 MED ORDER — SENNA 8.6 MG PO TABS
1.0000 | ORAL_TABLET | Freq: Two times a day (BID) | ORAL | Status: DC
  Administered 2014-03-23 – 2014-03-25 (×5): 8.6 mg via ORAL
  Filled 2014-03-23 (×10): qty 1

## 2014-03-23 MED ORDER — PANTOPRAZOLE SODIUM 40 MG IV SOLR
40.0000 mg | Freq: Every day | INTRAVENOUS | Status: DC
  Filled 2014-03-23: qty 40

## 2014-03-23 MED ORDER — DOCUSATE SODIUM 100 MG PO CAPS
100.0000 mg | ORAL_CAPSULE | Freq: Two times a day (BID) | ORAL | Status: DC
  Administered 2014-03-23 – 2014-03-26 (×8): 100 mg via ORAL
  Filled 2014-03-23 (×13): qty 1

## 2014-03-23 MED ORDER — POLYETHYLENE GLYCOL 3350 17 G PO PACK
17.0000 g | PACK | Freq: Every day | ORAL | Status: DC | PRN
  Filled 2014-03-23: qty 1

## 2014-03-23 MED ORDER — NEOSTIGMINE METHYLSULFATE 10 MG/10ML IV SOLN
INTRAVENOUS | Status: DC | PRN
  Administered 2014-03-23: 5 mg via INTRAVENOUS

## 2014-03-23 MED ORDER — SUCCINYLCHOLINE CHLORIDE 20 MG/ML IJ SOLN
INTRAMUSCULAR | Status: DC | PRN
Start: 2014-03-22 — End: 2014-03-23
  Administered 2014-03-22: 100 mg via INTRAVENOUS

## 2014-03-23 MED ORDER — IOHEXOL 300 MG/ML  SOLN
100.0000 mL | Freq: Once | INTRAMUSCULAR | Status: AC | PRN
  Administered 2014-03-23: 100 mL via INTRAVENOUS

## 2014-03-23 MED ORDER — DIAZEPAM 5 MG PO TABS
5.0000 mg | ORAL_TABLET | Freq: Four times a day (QID) | ORAL | Status: DC | PRN
Start: 2014-03-23 — End: 2014-03-27
  Administered 2014-03-24 (×3): 5 mg via ORAL
  Administered 2014-03-25: 10 mg via ORAL
  Filled 2014-03-23 (×3): qty 1
  Filled 2014-03-23: qty 2

## 2014-03-23 NOTE — Anesthesia Postprocedure Evaluation (Signed)
  Anesthesia Post-op Note  Patient: Animal nutritionist  Procedure(s) Performed: Procedure(s) with comments: THORACIC two - four  LAMINECTOMY FOR TUMOR (N/A) - Throacic two  - four laminectomy for tumor  Patient Location: PACU  Anesthesia Type:General  Level of Consciousness: awake, alert  and oriented  Airway and Oxygen Therapy: Patient Spontanous Breathing and Patient connected to nasal cannula oxygen  Post-op Pain: mild  Post-op Assessment: Post-op Vital signs reviewed, Patient's Cardiovascular Status Stable, Respiratory Function Stable, Patent Airway, No signs of Nausea or vomiting and Pain level controlled  Post-op Vital Signs: stable  Last Vitals:  Filed Vitals:   03/23/14 0525  BP: 127/90  Pulse: 105  Temp: 37.2 C  Resp: 18    Complications: No apparent anesthesia complications

## 2014-03-23 NOTE — Anesthesia Procedure Notes (Signed)
Procedure Name: Intubation Date/Time: 03/22/2014 11:25 PM Performed by: Hollie Salk Z Pre-anesthesia Checklist: Patient identified, Timeout performed, Emergency Drugs available, Suction available and Patient being monitored Patient Re-evaluated:Patient Re-evaluated prior to inductionOxygen Delivery Method: Circle system utilized Preoxygenation: Pre-oxygenation with 100% oxygen Intubation Type: IV induction, Rapid sequence and Cricoid Pressure applied Laryngoscope Size: Mac and 4 Grade View: Grade I Tube type: Subglottic suction tube Tube size: 8.0 mm Number of attempts: 1 Airway Equipment and Method: Stylet Placement Confirmation: ETT inserted through vocal cords under direct vision,  breath sounds checked- equal and bilateral and positive ETCO2 Secured at: 23 cm Tube secured with: Tape Dental Injury: Teeth and Oropharynx as per pre-operative assessment

## 2014-03-23 NOTE — Progress Notes (Signed)
Postoperative day 1. Patient's pain is well-controlled. States lower extremities feel stronger with better sensation.  Afebrile. Vital stable. Awake and alert. Motor significantly better. 4+ over 5 right lower extremity 4 over 5 left lower extremity. Sensation improved bilaterally. Wound clean and dry.  CT scan chest and abdomen negative except for the previously discovered tumor of T3. Pathology remains pending. Begin efforts at rehabilitation. Awaiting pathology with regard to further treatment.

## 2014-03-23 NOTE — Progress Notes (Signed)
PT Cancellation Note  Patient Details Name: Gary Conway MRN: [id number] DOB: [birth year]   Cancelled Treatment:    Reason Eval/Treat Not Completed:  (pt c/o nausea).  Pt declined all mobility 2/2 nausea.  Will try another time.     [name], Thornton Papas 03/23/2014, 12:09 PM

## 2014-03-23 NOTE — Brief Op Note (Signed)
03/22/2014 - 03/23/2014  1:52 AM  PATIENT:  Margretta Ditty  38 y.o. male  PRE-OPERATIVE DIAGNOSIS:  Thoracic Tumor  POST-OPERATIVE DIAGNOSIS:  Thoracic Tumor  PROCEDURE:  Procedure(s) with comments: THORACIC two - four  LAMINECTOMY FOR TUMOR (N/A) - Throacic two  - four laminectomy for tumor  SURGEON:  Surgeon(s) and Role:    * Charlie Pitter, MD - Primary  PHYSICIAN ASSISTANT:   ASSISTANTS:    ANESTHESIA:   general  EBL:  Total I/O In: 3920 [I.V.:3000; Blood:670; IV Piggyback:250] Out: 1300 [Urine:350; Blood:950]  BLOOD ADMINISTERED:none  DRAINS: (Large) Hemovact drain(s) in the Epidural space with  Suction Open   LOCAL MEDICATIONS USED:  MARCAINE     SPECIMEN:  Source of Specimen:  Thoracic epidural space  DISPOSITION OF SPECIMEN:  PATHOLOGY  COUNTS:  YES  TOURNIQUET:  * No tourniquets in log *  DICTATION: .Dragon Dictation  PLAN OF CARE: Admit to inpatient   PATIENT DISPOSITION:  PACU - hemodynamically stable.   Delay start of Pharmacological VTE agent (>24hrs) due to surgical blood loss or risk of bleeding: yes

## 2014-03-23 NOTE — Progress Notes (Signed)
Utilization review completed.  

## 2014-03-23 NOTE — Progress Notes (Signed)
Inpatient Diabetes Program Recommendations  AACE/ADA: New Consensus Statement on Inpatient Glycemic Control (2013)  Target Ranges:  Prepandial:   less than 140 mg/dL      Peak postprandial:   less than 180 mg/dL (1-2 hours)      Critically ill patients:  140 - 180 mg/dL   Results for KEKAI, GETER (MRN [id number]) as of 03/23/2014 09:54  Ref. Range 03/23/2014 01:23 03/23/2014 08:25  Glucose Latest Range: 70-99 mg/dL 151 (H) 252 (H)   Diabetes history: No Outpatient Diabetes medications: NA Current orders for Inpatient glycemic control: None  Inpatient Diabetes Program Recommendations Correction (SSI): Patient has no history of diabetes. Noted fasting lab glucose of 252 mg/dl at 8:25 this morning likely related to Decadron. While inpatient and ordered Decadron, please consider ordering CBGs with Novolog correction scale.  Thanks, Barnie Alderman, RN, MSN, CCRN Diabetes Coordinator Inpatient Diabetes Program [phone] (Team Pager) [phone] ([facility] office) [phone] [facility] office)

## 2014-03-23 NOTE — H&P (Signed)
HPI: A 38 year old male otherwise healthy with a 2 month history of progressive bilateral lower extremity weakness, sensory loss, and incoordination. No history of trauma. No history of malignancy. No history of infectious etiology. Patient with minimal if any pain. Patient has become increasingly bothered by lower extremity spasms and incoordination. He has essentially become nonambulatory independently but still can walk a little bit with a walker as an aide. He denies bowel or bladder dysfunction. He has relative sensory loss from his chest distally.  No past medical history on file.  Past Surgical History  Procedure Laterality Date  . Leg surgery      Family History  Problem Relation Age of Onset  . Breast cancer Mother     Social History:  reports that he has never smoked. He does not have any smokeless tobacco history on file. He reports that he drinks alcohol. He reports that he does not use illicit drugs.  Allergies: No Known Allergies  Medications: I have reviewed the patient's current medications.  No results found for this or any previous visit (from the past 48 hour(s)).  Mr Gary Conway GE Conway  03/22/2014   [facility]  NEUROIMAGING REPORT   STUDY DATE: 03/22/14 PATIENT NAME: Gary Conway DOB: 04/03/1976 MRN: [id number]  ORDERING CLINICIAN: Andrey Spearman, MD  CLINICAL HISTORY: 38 year old male with lower extremity weakness and hyperreflexia.  EXAM: MRI brain (with and without)  TECHNIQUE: MRI of the brain with and without Conway was obtained utilizing 5 mm axial slices with T1, T2, T2 flair, T2 star gradient echo and diffusion weighted views.  T1 sagittal, T2 coronal and postcontrast views in the axial and coronal plane were obtained. Conway: 45ml gadavist IMAGING SITE: [place] (1.2 Tesla MRI)   FINDINGS:  No abnormal lesions are seen on diffusion-weighted views to suggest acute ischemia. The cortical sulci, fissures and cisterns are  normal in size and appearance. Lateral, third and fourth ventricle are normal in size and appearance. No extra-axial fluid collections are seen. No evidence of mass effect or midline shift.    No abnormal lesions are seen on post Conway views.  On sagittal views the posterior fossa, pituitary gland and corpus callosum are unremarkable. No evidence of intracranial hemorrhage on gradient-echo views. The orbits and their contents, paranasal sinuses and calvarium are unremarkable.  Intracranial flow voids are present.   IMPRESSION:  Normal MRI brain (with and without).     INTERPRETING PHYSICIAN:  Penni Bombard, MD Certified in Neurology, Neurophysiology and Neuroimaging  [facility] [address] [place], [place] [address] [phone]   Mr Gary Conway  03/22/2014   [facility]  NEUROIMAGING REPORT   STUDY DATE: 03/22/14 PATIENT NAME: Gary Conway DOB: May 23, 1976 MRN: [id number]  ORDERING CLINICIAN: Andrey Spearman, MD  CLINICAL HISTORY: 38 year old male with lower extremity weakness and hyperreflexia.   EXAM: MRI Gary spine (with and without)  TECHNIQUE: MRI of the Gary spine was obtained utilizing 3 mm sagittal slices from the posterior fossa down to the T3-4 level with T1, T2 and inversion recovery views. In addition 4 mm axial slices from W9-6 down to T1-2 level were included with T2 and gradient echo views. Conway: 46ml gadavist  IMAGING SITE: [place] (1.2 Tesla MRI)   FINDINGS:  On sagittal views the vertebral bodies have normal height and alignment.  Again noted is a T3 vertebral body lesion (with edema and enhancement) as noted  on MRI Gary spine study from same day. The spinal cord is notable for severe cord compression at T3 with spinal cord edema at T2-T3. Remainder of visualized spinal cord is normal in size and appearance. The posterior fossa, pituitary gland and paraspinal soft tissues are  unremarkable.    On axial views there is no spinal stenosis or foraminal narrowing. The T3 level is not visualized on this study, but is seen on corresponding Gary MRI from same day. Limited views of the soft tissues of the head and neck are unremarkable. No abnormal enhancing lesions (except for T3 level abnormalities).    IMPRESSION:  Abnormal MRI Gary spine (with and without) demonstrating: 1. Again seen is the abnormal T3 vertebral body lesion (with edema and enhancement) with resultant severe cord compression at T3 with spinal cord edema at T2-T3. Suspicious for neoplastic (metastatic) etiology. See MRI Gary spine for further details.  2. The remaining Gary spine levels are unremarkable.     INTERPRETING PHYSICIAN:  Penni Bombard, MD Certified in Neurology, Neurophysiology and Neuroimaging  [facility] [address], [place], [place] [address] [phone]   Mr Gary Conway  03/22/2014   [facility]  NEUROIMAGING REPORT   STUDY DATE: 03/22/14 PATIENT NAME: Gary Conway DOB: [birth year] MRN: [id number]  ORDERING CLINICIAN: Andrey Spearman, MD  CLINICAL HISTORY: 38 year old male with lower extremity weakness, numbness, hyperreflexia.  EXAM: MRI Gary spine (with and without)  TECHNIQUE: MRI of the Gary spine was obtained utilizing 3 mm sagittal slices from L4-6 down to the L1-2 level with T1, T2 and inversion recovery views. In addition 4 mm axial slices from T0-P5 down to T12-L1 level were included with T1, T2 and gradient echo views.   Conway: 93ml gadavist  IMAGING SITE: [place] (1.2 Tesla MRI)   FINDINGS:  On sagittal views the vertebral bodies have normal height and alignment.    There is abnormal T2 and STIR hyperintense signal with T3 vertebral body, extending in the right pedicle. This hypointense on T1 with avid enhancement. There is extension of abnormal enhancement encircling the spinal  cord at this level, resulting in severe cord compression. There is subtle increased T2 hyperintense signal within the spinal cord at this level (especially on sagittal T2 and STIR views) suggesting spinal cord edema. Findings concerning for a neoplastic (metastatic) process. Infectious process considered but less likely.   Elsewhere the spinal cord is normal in size and appearance. The paraspinal soft tissues are unremarkable.    On axial views again demonstrating at T3 level, the enhancing vertebral body abnormality with adjacent severe spinal cord compression. At other levels, there is no spinal stenosis or foraminal narrowing. Limited views of the aorta, kidneys, liver, lungs and paraspinal muscles are unremarkable.   IMPRESSION:  Abnormal MRI Gary spine (with and without) demonstrating: 1. At T3 level, there is an isolated, abnormal vertebral body lesion (with enhancement and edema) with extension along the right pedicle and epidural extension encircling the spinal cord. There is resultant severe spinal cord compression (with mild cord edema). Findings concerning for a neoplastic (likely metastatic) process. Infectious process considered but less likely.  2. Remaining Gary spine levels unremarkable.     INTERPRETING PHYSICIAN:  Penni Bombard, MD Certified in Neurology, Neurophysiology and Neuroimaging  [facility] [address] [place], [place] [address] [phone]     Blood pressure 156/105, pulse 123, temperature 98.9 F (37.2 C), temperature source  Oral, resp. rate 20, height 5\' 8"  (1.727 m), weight 94.3 kg (207 lb 14.3 oz), SpO2 97.00%. the patient is awake and alert. He is oriented and appropriate. Cranial nerve function is intact. Speech is fluent. Upper extremity strength 5/5 bilaterally. Lower extremity strength with marked increased tone left greater than right. Right lower extremity strength 4/5. Left lower extremity strength 4 minus/5. Toes upgoing to  plantar stimulation. Patient normal reflexive in both upper extremities.hyperreflexia with clonus both lower extremities. Examination head ears eyes and throat is unremarkable neck is supple. Chest is clear. Abdomen benign.  Assessment/Plan: unusual T3 neoplastic lesion in an otherwise healthy 38 year old male. Patient with a significant Gary myelopathy secondary to epidural tumor spread and compression. I have recommended the patient undergo urgent decompressive surgery both for hopeful preservation and/or improvement of neurologic function and establishment of diagnosis of tumor type. I discussed the risks and benefits involved with surgery including but not limited to the risk of anesthesia, bleeding, infection, CSF leak, nerve root injury, spinal cord injury, later and stability, tumor recurrence, and non-benefit. The patient is aware the risks and wishes to proceed. We will proceed urgently tonight Dasja Brase A 03/22/2014, 9:46 PM

## 2014-03-23 NOTE — Op Note (Signed)
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  Asst. Surgeon: None  Anesthesia: General  Indication: 38 year old male with increasing bilateral lower extremity spastic weakness and sensory changes consistent with a thoracic myelopathy. Workup demonstrates evidence of an enhancing tumor involving the T3 vertebral body extending into the pedicles bilaterally right greater than left with epidural spinal cord compression. Patient presents now for emergent decompressive surgery and biopsy of lesion.  Operative note: After induction of anesthesia, patient position prone onto bolsters with his head fixed in [place] pin headrest in a neutral position. Patient's upper thoracic region was prepped and draped sterilely. Incision was then made overlying T2-T4. This carried down sharply in the midline. Subperiosteal section of performed bilaterally. Retractor placed. X-ray taken. Level was confirmed. Thoracic laminectomy performed using high-speed drill and Kerrison rongeurs to remove the entire lamina of L2, L3, L4. There is no significant dorsal epidural tumor as was expected on imaging. The pedicle on the right at L3 was partially involved with tumor. Tumor was dissected off the pedicle and specimens were obtained. The gutters of the T3 level were cleaned bilaterally and foraminotomies performed on the course of the exiting T3 nerve roots bilaterally. There was no evidence of pathologic instability. Hemostasis was then achieved using Surgifoam. Surgifoam was evacuated and Gelfoam was placed over the laminectomy defect. Wound is then closed in layers with Vicryl sutures. A medium Hemovac drain was left in the epidural space. Steri-Strips and sterile dressing were applied. No  apparent complications. Pathology due to the small amount of tumor was only sent for permanent evaluation.

## 2014-03-23 NOTE — H&P (Signed)
[facility] History and Physical  Gary Conway EXH:[id number] DOB: Jun 03, 1975 DOA: 03/22/2014  Referring physician: Dr Leta Baptist PCP: Elizabeth Palau, MD   Chief Complaint: Lower extremity weakness and numbness  HPI: Gary Conway is a 38 y.o. male  With no significant past medical history who had presented to neurologist office for further evaluation of lower extremity weakness on 03/22/2014. It was noted that patient had been having a two-month history of progressive bilateral lower extremity weakness, decreased sensation and gait abnormalities. Patient noted onset of numbness and tingling in his midthoracic region in the middle of August 2005 which went down to his legs. Within 1-2 weeks of onset it noticed significant numbness and weakness in his lower extremities with his legs getting more stiffer with more cramps weakness and spasms as well as gait difficulty. Patient had presented to the emergency room in January 04 2014 had a MRI of the lumbar spine done which showed degenerative changes. Patient subsequently followed up with orthopedics who tried epidural steroid injection without significant improvement. Patient subsequently presented to neurology for further evaluation. Patient denied any fever, no chills, no nausea, no vomiting, no abdominal pain, no chest pain, no shortness of breath, no diarrhea, no constipation, no bowel or bladder incontinence, no visual changes, no slurred speech, no headaches. Patient was seen in neurologist office further evaluation was done with MRI of the brain which was negative, MRI of the C-spine and T-spine. MRI of the T-spine and C-spine did show an isolated abnormal vertebral body lesion at T3 with enhancement and edema with extension along the right pedicle and epidural extension encircling the spinal cord resulting in severe spinal cord compression. Concern for metastatic disease. Patient was subsequently admitted as a direct admit and  consultation with neurosurgery was obtained. It was recommended per neurosurgery, Dr. pull the patient undergo urgent decompressive surgery which was done on 03/22/2014. Patient currently stable. Patient states he's feeling better. Patient states improved sensation in his lower extremities with improved strength in his lower extremities.   Review of Systems: As per history of present illness otherwise negative. Constitutional:  No weight loss, night sweats, Fevers, chills, fatigue.  HEENT:  No headaches, Difficulty swallowing,Tooth/dental problems,Sore throat,  No sneezing, itching, ear ache, nasal congestion, post nasal drip,  Cardio-vascular:  No chest pain, Orthopnea, PND, swelling in lower extremities, anasarca, dizziness, palpitations  GI:  No heartburn, indigestion, abdominal pain, nausea, vomiting, diarrhea, change in bowel habits, loss of appetite  Resp:  No shortness of breath with exertion or at rest. No excess mucus, no productive cough, No non-productive cough, No coughing up of blood.No change in color of mucus.No wheezing.No chest wall deformity  Skin:  no rash or lesions.  GU:  no dysuria, change in color of urine, no urgency or frequency. No flank pain.  Musculoskeletal:  No joint pain or swelling. No decreased range of motion. No back pain.  Psych:  No change in mood or affect. No depression or anxiety. No memory loss.   History reviewed. No pertinent past medical history. Past Surgical History  Procedure Laterality Date  . Leg surgery     Social History:  reports that he has never smoked. He does not have any smokeless tobacco history on file. He reports that he drinks alcohol. He reports that he does not use illicit drugs.  No Known Allergies  Family History  Problem Relation Age of Onset  . Breast cancer Mother      Prior to Admission medications   Medication Sig  Start Date End Date Taking? Authorizing Provider  cyclobenzaprine (FLEXERIL) 5 MG tablet Take  5 mg by mouth 3 (three) times daily as needed for muscle spasms.   Yes Historical Provider, MD  ibuprofen (ADVIL,MOTRIN) 200 MG tablet Take 200 mg by mouth every 6 (six) hours as needed for mild pain.   Yes Historical Provider, MD  naproxen (NAPROSYN) 500 MG tablet Take 1 tablet (500 mg total) by mouth 2 (two) times daily with a meal. 01/04/14   Johnna Acosta, MD   Physical Exam: Filed Vitals:   03/23/14 0425 03/23/14 0525 03/23/14 0625 03/23/14 1006  BP: 127/82 127/90 122/62   Pulse: 91 105 105 105  Temp: 98.7 F (37.1 C) 99 F (37.2 C) 97.9 F (36.6 C) 98.7 F (37.1 C)  TempSrc: Oral Oral Oral Oral  Resp: 20 18 18 16   Height:      Weight:      SpO2: 94% 97% 99% 95%    Wt Readings from Last 3 Encounters:  03/22/14 94.3 kg (207 lb 14.3 oz)  03/22/14 95.255 kg (210 lb)  03/22/14 94.3 kg (207 lb 14.3 oz)    General:  Well-developed well-nourished in no acute cardiopulmonary distress. Speaking in full sentences. Eyes: PERRLA, EOMI, normal lids, irises & conjunctiva ENT: grossly normal hearing, lips & tongue Neck: no LAD, masses or thyromegaly. Patient with a drain noted in the posterior neck region. Cardiovascular: RRR, no m/r/g. No LE edema. Respiratory: CTA bilaterally, no w/r/r. Normal respiratory effort. Abdomen: soft, ntnd, positive bowel sounds, no rebound, no guarding Skin: no rash or induration seen on limited exam Musculoskeletal: 5 out of 5 bilateral upper extremity strength. 4-5/5 bilateral lower extremity strength. Psychiatric: grossly normal mood and affect, speech fluent and appropriate Neurologic: Alert and oriented 3. Cranial nerves II through XII are grossly intact. Sensation is intact. Visual fields are intact. Unable to elicit reflexes symmetrically and diffusely. Finger-to-nose is intact. Gait not tested secondary to safety.           Labs on Admission:  Basic Metabolic Panel:  Recent Labs Lab 03/23/14 0123 03/23/14 0825  NA 139 136*  K 3.8 5.1  CL   --  99  CO2  --  23  GLUCOSE 151* 252*  BUN  --  11  CREATININE  --  0.92  CALCIUM  --  8.7  MG  --  1.8   Liver Function Tests:  Recent Labs Lab 03/23/14 0825  AST 23  ALT 25  ALKPHOS 47  BILITOT 0.5  PROT 6.9  ALBUMIN 3.6   No results found for this basename: LIPASE, AMYLASE,  in the last 168 hours No results found for this basename: AMMONIA,  in the last 168 hours CBC:  Recent Labs Lab 03/23/14 0123 03/23/14 0825  WBC  --  12.2*  NEUTROABS  --  11.5*  HGB 8.8* 13.1  HCT 26.0* 37.6*  MCV  --  74.9*  PLT  --  141*   Cardiac Enzymes: No results found for this basename: CKTOTAL, CKMB, CKMBINDEX, TROPONINI,  in the last 168 hours  BNP (last 3 results) No results found for this basename: PROBNP,  in the last 8760 hours CBG: No results found for this basename: GLUCAP,  in the last 168 hours  Radiological Exams on Admission: Mr [name] Wo Contrast  03/22/2014   [facility]  NEUROIMAGING REPORT   STUDY DATE: 03/22/14 PATIENT NAME: Floy Riegler DOB: Sep 06, 1975 MRN: [id number]  ORDERING CLINICIAN: Andrey Spearman, MD  CLINICAL HISTORY: 38 year old male with lower extremity weakness and hyperreflexia.  EXAM: MRI brain (with and without)  TECHNIQUE: MRI of the brain with and without contrast was obtained utilizing 5 mm axial slices with T1, T2, T2 flair, T2 star gradient echo and diffusion weighted views.  T1 sagittal, T2 coronal and postcontrast views in the axial and coronal plane were obtained. CONTRAST: 42ml gadavist IMAGING SITE: [facility] (1.2 Tesla MRI)   FINDINGS:  No abnormal lesions are seen on diffusion-weighted views to suggest acute ischemia. The cortical sulci, fissures and cisterns are normal in size and appearance. Lateral, third and fourth ventricle are normal in size and appearance. No extra-axial fluid collections are seen. No evidence of mass effect or midline shift.    No abnormal lesions are seen on post contrast views.   On sagittal views the posterior fossa, pituitary gland and corpus callosum are unremarkable. No evidence of intracranial hemorrhage on gradient-echo views. The orbits and their contents, paranasal sinuses and calvarium are unremarkable.  Intracranial flow voids are present.   IMPRESSION:  Normal MRI brain (with and without).     INTERPRETING PHYSICIAN:  Penni Bombard, MD Certified in Neurology, Neurophysiology and Neuroimaging  [facility] [address] [place], [place] [address] [phone]   Mr Cervical Spine [name] Contrast  03/22/2014   [facility]  NEUROIMAGING REPORT   STUDY DATE: 03/22/14 PATIENT NAME: Natalio Salois DOB: 10/14/1975 MRN: [id number]  ORDERING CLINICIAN: Andrey Spearman, MD  CLINICAL HISTORY: 38 year old male with lower extremity weakness and hyperreflexia.   EXAM: MRI cervical spine (with and without)  TECHNIQUE: MRI of the cervical spine was obtained utilizing 3 mm sagittal slices from the posterior fossa down to the T3-4 level with T1, T2 and inversion recovery views. In addition 4 mm axial slices from O1-3 down to T1-2 level were included with T2 and gradient echo views. CONTRAST: 87ml gadavist  IMAGING SITE: [place] (1.2 Tesla MRI)   FINDINGS:  On sagittal views the vertebral bodies have normal height and alignment.  Again noted is a T3 vertebral body lesion (with edema and enhancement) as noted on MRI thoracic spine study from same day. The spinal cord is notable for severe cord compression at T3 with spinal cord edema at T2-T3. Remainder of visualized spinal cord is normal in size and appearance. The posterior fossa, pituitary gland and paraspinal soft tissues are unremarkable.    On axial views there is no spinal stenosis or foraminal narrowing. The T3 level is not visualized on this study, but is seen on corresponding thoracic MRI from same day. Limited views of the soft tissues of the head and neck are  unremarkable. No abnormal enhancing lesions (except for T3 level abnormalities).    IMPRESSION:  Abnormal MRI cervical spine (with and without) demonstrating: 1. Again seen is the abnormal T3 vertebral body lesion (with edema and enhancement) with resultant severe cord compression at T3 with spinal cord edema at T2-T3. Suspicious for neoplastic (metastatic) etiology. See MRI thoracic spine for further details.  2. The remaining cervical spine levels are unremarkable.     INTERPRETING PHYSICIAN:  Penni Bombard, MD Certified in Neurology, Neurophysiology and Neuroimaging  [facility] [address] [place], [place] [address] [phone]   Mr Thoracic Spine [name] Contrast  03/22/2014   [facility]  NEUROIMAGING REPORT   STUDY DATE: 03/22/14 PATIENT NAME: Mikail Goostree DOB: Jul 03, 1975 MRN:  [id number]  ORDERING CLINICIAN: Andrey Spearman, MD  CLINICAL HISTORY: 37 year old male with lower extremity weakness, numbness, hyperreflexia.  EXAM: MRI thoracic spine (with and without)  TECHNIQUE: MRI of the thoracic spine was obtained utilizing 3 mm sagittal slices from O3-5 down to the L1-2 level with T1, T2 and inversion recovery views. In addition 4 mm axial slices from K0-X3 down to T12-L1 level were included with T1, T2 and gradient echo views.   CONTRAST: 84ml gadavist  IMAGING SITE: [place] (1.2 Tesla MRI)   FINDINGS:  On sagittal views the vertebral bodies have normal height and alignment.    There is abnormal T2 and STIR hyperintense signal with T3 vertebral body, extending in the right pedicle. This hypointense on T1 with avid enhancement. There is extension of abnormal enhancement encircling the spinal cord at this level, resulting in severe cord compression. There is subtle increased T2 hyperintense signal within the spinal cord at this level (especially on sagittal T2 and STIR views) suggesting spinal cord edema. Findings concerning for a  neoplastic (metastatic) process. Infectious process considered but less likely.   Elsewhere the spinal cord is normal in size and appearance. The paraspinal soft tissues are unremarkable.    On axial views again demonstrating at T3 level, the enhancing vertebral body abnormality with adjacent severe spinal cord compression. At other levels, there is no spinal stenosis or foraminal narrowing. Limited views of the aorta, kidneys, liver, lungs and paraspinal muscles are unremarkable.   IMPRESSION:  Abnormal MRI thoracic spine (with and without) demonstrating: 1. At T3 level, there is an isolated, abnormal vertebral body lesion (with enhancement and edema) with extension along the right pedicle and epidural extension encircling the spinal cord. There is resultant severe spinal cord compression (with mild cord edema). Findings concerning for a neoplastic (likely metastatic) process. Infectious process considered but less likely.  2. Remaining thoracic spine levels unremarkable.     INTERPRETING PHYSICIAN:  Penni Bombard, MD Certified in Neurology, Neurophysiology and Neuroimaging  [facility] [address], [place], [place] [address] [phone]   Dg Thoracic Spine 1 View  03/23/2014   CLINICAL DATA:  Thoracic 2 through 4 laminectomy for tumor; history of thoracic metastatic disease.  EXAM: OPERATIVE THORACIC SPINE  VIEW(S)  COMPARISON:  None.  FINDINGS: Limited radiograph of the upper thoracic and lower cervical spine. Extensive hardware overlies the upper chest resulting in obscuration, surgical instruments at the level of T3/4. Endotracheal tube tip projects immediately above the carina.  IMPRESSION: Limited intraoperative radiograph, surgical instruments at the level of T3-4.   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 Patient with clinical improvement. Patient states sensation is improving as well as strengthening lower extremities. PT/OT. Continue IV Decadron. MRI of head was negative. CT of the chest, abdomen and pelvis is pending. Per neurosurgery.  #2 hyperglycemia Likely secondary to steroids. Patient denies any history of diabetes. Check a hemoglobin A1c. Place on sliding scale insulin.  #3 leukocytosis Likely a reactive leukocytosis in addition to steroid induced. CT chest is pending. Check a UA with cultures and sensitivities. Follow.  No need for antibiotics at this time.  #4 bilateral lower extremity weakness/numbness Secondary to problem #1.  #5 prophylaxis PPI for GI prophylaxis. SCDs for DVT prophylaxis.   Code Status: Full DVT Prophylaxis: SCDs Family Communication: Updated patient and girlfriend at bedside. Disposition Plan: Remain inpatient.  Time spent: 2 minutes  THOMPSON,DANIEL M.D. [facility] Pager [phone]

## 2014-03-23 NOTE — Transfer of Care (Signed)
Immediate Anesthesia Transfer of Care Note  Patient: Gary Conway  Procedure(s) Performed: Procedure(s) with comments: THORACIC two - four  LAMINECTOMY FOR TUMOR (N/A) - Throacic two  - four laminectomy for tumor  Patient Location: PACU  Anesthesia Type:General  Level of Consciousness: awake and patient cooperative  Airway & Oxygen Therapy: Patient Spontanous Breathing and Patient connected to face mask oxygen  Post-op Assessment: Report given to PACU RN and Post -op Vital signs reviewed and stable  Post vital signs: Reviewed and stable  Complications: No apparent anesthesia complications

## 2014-03-24 ENCOUNTER — Encounter (HOSPITAL_COMMUNITY): Payer: Self-pay | Admitting: Neurosurgery

## 2014-03-24 ENCOUNTER — Ambulatory Visit: Payer: 59 | Admitting: Diagnostic Neuroimaging

## 2014-03-24 DIAGNOSIS — IMO0002 Reserved for concepts with insufficient information to code with codable children: Secondary | ICD-10-CM | POA: Diagnosis present

## 2014-03-24 DIAGNOSIS — E669 Obesity, unspecified: Secondary | ICD-10-CM | POA: Diagnosis present

## 2014-03-24 DIAGNOSIS — R29898 Other symptoms and signs involving the musculoskeletal system: Secondary | ICD-10-CM

## 2014-03-24 DIAGNOSIS — E1169 Type 2 diabetes mellitus with other specified complication: Secondary | ICD-10-CM | POA: Diagnosis present

## 2014-03-24 DIAGNOSIS — E1165 Type 2 diabetes mellitus with hyperglycemia: Secondary | ICD-10-CM

## 2014-03-24 DIAGNOSIS — E119 Type 2 diabetes mellitus without complications: Secondary | ICD-10-CM | POA: Diagnosis present

## 2014-03-24 LAB — CBC WITH DIFFERENTIAL/PLATELET
BASOS ABS: 0 10*3/uL (ref 0.0–0.1)
BASOS PCT: 0 % (ref 0–1)
EOS ABS: 0 10*3/uL (ref 0.0–0.7)
Eosinophils Relative: 0 % (ref 0–5)
HCT: 35.8 % — ABNORMAL LOW (ref 39.0–52.0)
Hemoglobin: 12 g/dL — ABNORMAL LOW (ref 13.0–17.0)
Lymphocytes Relative: 4 % — ABNORMAL LOW (ref 12–46)
Lymphs Abs: 0.6 10*3/uL — ABNORMAL LOW (ref 0.7–4.0)
MCH: 25.7 pg — AB (ref 26.0–34.0)
MCHC: 33.5 g/dL (ref 30.0–36.0)
MCV: 76.7 fL — ABNORMAL LOW (ref 78.0–100.0)
Monocytes Absolute: 0.7 10*3/uL (ref 0.1–1.0)
Monocytes Relative: 4 % (ref 3–12)
Neutro Abs: 15.7 10*3/uL — ABNORMAL HIGH (ref 1.7–7.7)
Neutrophils Relative %: 92 % — ABNORMAL HIGH (ref 43–77)
PLATELETS: 146 10*3/uL — AB (ref 150–400)
RBC: 4.67 MIL/uL (ref 4.22–5.81)
RDW: 16.2 % — ABNORMAL HIGH (ref 11.5–15.5)
WBC: 17.1 10*3/uL — ABNORMAL HIGH (ref 4.0–10.5)

## 2014-03-24 LAB — COMPREHENSIVE METABOLIC PANEL
ALBUMIN: 3.3 g/dL — AB (ref 3.5–5.2)
ALK PHOS: 51 U/L (ref 39–117)
ALT: 21 U/L (ref 0–53)
ANION GAP: 13 (ref 5–15)
AST: 17 U/L (ref 0–37)
BUN: 20 mg/dL (ref 6–23)
CHLORIDE: 100 meq/L (ref 96–112)
CO2: 22 mEq/L (ref 19–32)
Calcium: 8.9 mg/dL (ref 8.4–10.5)
Creatinine, Ser: 1.07 mg/dL (ref 0.50–1.35)
GFR calc Af Amer: >90 mL/min (ref >90)
GFR calc non Af Amer: 85 mL/min — ABNORMAL LOW (ref >90)
Glucose, Bld: 320 mg/dL — ABNORMAL HIGH (ref 70–99)
Potassium: 4.7 mEq/L (ref 3.7–5.3)
SODIUM: 135 meq/L — AB (ref 137–147)
TOTAL PROTEIN: 6.5 g/dL (ref 6.0–8.3)

## 2014-03-24 LAB — GLUCOSE, CAPILLARY
GLUCOSE-CAPILLARY: 316 mg/dL — AB (ref 70–99)
Glucose-Capillary: 396 mg/dL — ABNORMAL HIGH (ref 70–99)
Glucose-Capillary: 294 mg/dL — ABNORMAL HIGH (ref 70–99)

## 2014-03-24 MED ORDER — INSULIN ASPART 100 UNIT/ML ~~LOC~~ SOLN
0.0000 [IU] | Freq: Every day | SUBCUTANEOUS | Status: DC
  Administered 2014-03-24 – 2014-03-25 (×2): 3 [IU] via SUBCUTANEOUS
  Administered 2014-03-26: 5 [IU] via SUBCUTANEOUS

## 2014-03-24 MED ORDER — LIVING WELL WITH DIABETES BOOK
Freq: Once | Status: AC
  Administered 2014-03-24: 17:00:00
  Filled 2014-03-24: qty 1

## 2014-03-24 MED ORDER — INSULIN ASPART 100 UNIT/ML ~~LOC~~ SOLN
0.0000 [IU] | Freq: Three times a day (TID) | SUBCUTANEOUS | Status: DC
  Administered 2014-03-24: 11 [IU] via SUBCUTANEOUS
  Administered 2014-03-24: 20 [IU] via SUBCUTANEOUS
  Administered 2014-03-25 (×2): 15 [IU] via SUBCUTANEOUS
  Administered 2014-03-25: 7 [IU] via SUBCUTANEOUS
  Administered 2014-03-26: 11 [IU] via SUBCUTANEOUS
  Administered 2014-03-26: 4 [IU] via SUBCUTANEOUS
  Administered 2014-03-26: 15 [IU] via SUBCUTANEOUS
  Administered 2014-03-27: 4 [IU] via SUBCUTANEOUS

## 2014-03-24 MED ORDER — INSULIN GLARGINE 100 UNIT/ML ~~LOC~~ SOLN
5.0000 [IU] | Freq: Every day | SUBCUTANEOUS | Status: DC
  Administered 2014-03-24: 5 [IU] via SUBCUTANEOUS
  Filled 2014-03-24 (×2): qty 0.05

## 2014-03-24 NOTE — Evaluation (Signed)
Occupational Therapy Evaluation Patient Details Name: Gary Conway MRN: [id number] DOB: Jan 08, 1976 Today's Date: 03/24/2014    History of Present Illness Pt admitted for progressive weakness and parasthesias in bil LE's.  Images showed  thoracic mass in area of T3., pt s/p laminectomy and microdiscectomy with resection of tumor at T3.   Clinical Impression   Pt admitted with above. He demonstrates the below listed deficits and will benefit from continued OT to maximize safety and independence with BADLs.  Pt presents to OT with impaired balance and LE spasms.  Currently, he requires supervision with BADLs, anticipate he will progress quickly to mod I with RW.  Will follow acutely       Follow Up Recommendations  No OT follow up;Supervision - Intermittent    Equipment Recommendations  Tub/shower bench    Recommendations for Other Services       Precautions / Restrictions Precautions Precautions: Fall;Back Precaution Booklet Issued: No      Mobility Bed Mobility Overal bed mobility: Needs Assistance Bed Mobility: Sidelying to Sit;Rolling Rolling: Min guard Sidelying to sit: Min guard       General bed mobility comments: cues for best technigue and minimal assist  Transfers Overall transfer level: Needs assistance Equipment used: Rolling walker (2 wheeled) Transfers: Sit to/from [facility] Sit to Stand: Supervision Stand pivot transfers: Supervision       General transfer comment: requires cues for hand placement     Balance Overall balance assessment: Needs assistance Sitting-balance support: Feet supported Sitting balance-Leahy Scale: Good     Standing balance support: Single extremity supported Standing balance-Leahy Scale: Fair                              ADL Overall ADL's : Needs assistance/impaired Eating/Feeding: Independent;Sitting   Grooming: Wash/dry hands;Wash/dry face;Oral care;Brushing  hair;Supervision/safety;Standing   Upper Body Bathing: Set up;Sitting   Lower Body Bathing: Supervison/ safety;Sit to/from stand   Upper Body Dressing : Set up;Sitting   Lower Body Dressing: Supervision/safety;Sit to/from stand   Toilet Transfer: Supervision/safety;Ambulation;Comfort height toilet   Toileting- Clothing Manipulation and Hygiene: Supervision/safety;Sit to/from stand       Functional mobility during ADLs: Supervision/safety;Rolling walker General ADL Comments: Pt able to cross ankles over knees to access feet for LB ADLs.  He requires supervision due to balance secondary to LE spasms.      Vision                     Perception     Praxis      Pertinent Vitals/Pain Pain Assessment: No/denies pain Faces Pain Scale: Hurts little more Pain Location: bil legs--hams and quads Pain Descriptors / Indicators: Cramping;Spasm Pain Intervention(s): Monitored during session     Hand Dominance     Extremity/Trunk Assessment Upper Extremity Assessment Upper Extremity Assessment: Defer to OT evaluation   Lower Extremity Assessment Lower Extremity Assessment: Generalized weakness;Overall WFL for tasks assessed       Communication Communication Communication: No difficulties   Cognition Arousal/Alertness: Awake/alert Behavior During Therapy: WFL for tasks assessed/performed Overall Cognitive Status: Within Functional Limits for tasks assessed                     General Comments       Exercises       Shoulder Instructions      Home Living Family/patient expects to be discharged to:: Other (Comment) (likely going to his  girlfriends home  ) Living Arrangements: Spouse/significant other                               Additional Comments: pt will have assist from girlfriends grown children while she works.      Prior Functioning/Environment Level of Independence: Independent with assistive device(s)        Comments: has  use RW recently, with occasional furniture cruising.    OT Diagnosis:     OT Problem List:     OT Treatment/Interventions:      OT Goals(Current goals can be found in the care plan section) Acute Rehab OT Goals Patient Stated Goal: Dance and play basketball  ADL Goals Pt Will Perform Grooming: with modified independence;standing Pt Will Perform Upper Body Bathing: with modified independence;sitting Pt Will Perform Lower Body Bathing: with modified independence;sit to/from stand Pt Will Perform Upper Body Dressing: with modified independence;sitting Pt Will Perform Lower Body Dressing: with modified independence;sit to/from stand Pt Will Transfer to Toilet: with modified independence;ambulating;regular height toilet;bedside commode;grab bars Pt Will Perform Toileting - Clothing Manipulation and hygiene: with modified independence;sit to/from stand Pt Will Perform Tub/Shower Transfer: Tub transfer;with supervision;ambulating;tub bench;3 in 1;rolling walker  OT Frequency: Min 2X/week   Barriers to D/C:            Co-evaluation              End of Session Equipment Utilized During Treatment: Rolling walker Nurse Communication: Mobility status  Activity Tolerance: Patient tolerated treatment well Patient left: in chair;with call bell/phone within reach;with family/visitor present   Time: 8309-4076 OT Time Calculation (min): 26 min Charges:  OT General Charges $OT Visit: 1 Procedure OT Evaluation $Initial OT Evaluation Tier I: 1 Procedure OT Treatments $Self Care/Home Management : 8-22 mins G-Codes:    Gary Conway 03/25/14, 2:21 PM

## 2014-03-24 NOTE — Evaluation (Signed)
Physical Therapy Evaluation Patient Details Name: Gary Conway MRN: [id number] DOB: [birth year] Today's Date: 03/24/2014   History of Present Illness  Pt admitted for progressive weakness and parasthesias in bil LE's.  Images showed  thoracic mass in area of T3., pt s/p laminectomy and microdiscectomy with resection of tumor at T3.  Clinical Impression  Pt admitted with/for above problems leading to thoracic surger..  Pt currently limited functionally due to the problems listed below.  (see problems list.)  Pt will benefit from PT to maximize function and safety to be able to get home safely with available assist  Of family .     Follow Up Recommendations Supervision for mobility/OOB    Equipment Recommendations  Rolling walker with 5" wheels;3in1 (PT)    Recommendations for Other Services Rehab consult     Precautions / Restrictions Precautions Precautions: Fall;Back Precaution Booklet Issued: No      Mobility  Bed Mobility Overal bed mobility: Needs Assistance Bed Mobility: Sidelying to Sit;Rolling Rolling: Min guard Sidelying to sit: Min guard       General bed mobility comments: cues for best technigue and minimal assist  Transfers Overall transfer level: Needs assistance   Transfers: Sit to/from Stand Sit to Stand: Min assist         General transfer comment: initally pt with difficulty getting upright.  Ambulation/Gait Ambulation/Gait assistance: Min guard Ambulation Distance (Feet): 180 Feet Assistive device: Rolling walker (2 wheeled) Gait Pattern/deviations: Step-through pattern;Decreased step length - right;Decreased step length - left Gait velocity: slow   General Gait Details: stiff-legged gait with heavy use of arms, but smoother gait as progressed.  Stairs            Wheelchair Mobility    Modified Rankin (Stroke Patients Only)       Balance Overall balance assessment: Needs assistance Sitting-balance support: No upper  extremity supported Sitting balance-Leahy Scale: Fair     Standing balance support: Single extremity supported;Bilateral upper extremity supported Standing balance-Leahy Scale: Fair                               Pertinent Vitals/Pain Pain Assessment: Faces Faces Pain Scale: Hurts little more Pain Location: bil legs--hams and quads Pain Descriptors / Indicators: Cramping;Spasm Pain Intervention(s): Monitored during session    Home Living Family/patient expects to be discharged to:: Other (Comment) (likely going to his girlfriends home  ) Living Arrangements: Spouse/significant other               Additional Comments: pt will have assist from girlfriends grown children while she works.    Prior Function Level of Independence: Independent with assistive device(s)         Comments: has use RW recently, with occasional furniture cruising.     Hand Dominance        Extremity/Trunk Assessment   Upper Extremity Assessment: Defer to OT evaluation           Lower Extremity Assessment: Generalized weakness;Overall WFL for tasks assessed         Communication   Communication: No difficulties  Cognition Arousal/Alertness: Awake/alert Behavior During Therapy: WFL for tasks assessed/performed Overall Cognitive Status: Within Functional Limits for tasks assessed                      General Comments General comments (skin integrity, edema, etc.): pt, Mom and girlfriend instructed in advised back care and precautions, log roll, lifting  restrictions and progression of activity.    Exercises        Assessment/Plan    PT Assessment Patient needs continued PT services  PT Diagnosis Difficulty walking;Acute pain   PT Problem List Decreased strength;Decreased activity tolerance;Decreased balance;Decreased mobility;Decreased coordination;Decreased knowledge of use of DME;Decreased knowledge of precautions;Pain  PT Treatment Interventions DME  instruction;Gait training;Stair training;Functional mobility training;Therapeutic activities;Balance training;Patient/family education   PT Goals (Current goals can be found in the Care Plan section) Acute Rehab PT Goals Patient Stated Goal: Get back to normal life, walking well. PT Goal Formulation: With patient Time For Goal Achievement: 04/07/14 Potential to Achieve Goals: Good    Frequency Min 5X/week   Barriers to discharge        Co-evaluation               End of Session   Activity Tolerance: Patient tolerated treatment well Patient left: in chair;with call bell/phone within reach;with family/visitor present Nurse Communication: Mobility status         Time: 1229-1300 PT Time Calculation (min): 31 min   Charges:   PT Evaluation $Initial PT Evaluation Tier I: 1 Procedure PT Treatments $Gait Training: 8-22 mins $Therapeutic Activity: 8-22 mins   PT G Codes:          Gary Conway, Tessie Fass 03/24/2014, 2:04 PM 03/24/2014  Donnella Sham, PT [phone]  (pager)

## 2014-03-24 NOTE — Progress Notes (Signed)
PROGRESS NOTE  Prateek Knipple GEX:[id number] DOB: 12-02-1975 DOA: 03/22/2014 PCP: Elizabeth Palau, MD  HPI/Recap of past 51 hours: 38 year old obese male with no past medical history presented to the hospital after several months of worsening weakness and paresthesias and found to have area concerning for metastatic lesion to the thoracic spine. Patient status post decompression and laminectomy being neurosurgery. Pathology pending. Started on steroids and noted of hyperglycemia with workup revealing A1c of 7.0.  Today patient feels weak, but overall better. No specific pain.  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 Patient and family understandably quite stressed about possibility of cancer. Active Problems:   Numbness and tingling of both legs + weakness, bilateral: Secondary to above. Improved status post surgery. seen by physical and occupational therapy will continue to work with patient.    Leukocytosis: Secondary to stress margination plus steroids. No signs of fever or infection.    Obesity (BMI 30-39.9) :patient meets criteria with BMI greater than 30.    DM (diabetes mellitus), type 2, uncontrolled: New diagnosis. Ordered outpatient education. Currently on Lantus plus sliding scale given hyperglycemia from steroids. Upon discharge, likely will discharge on metformin    Code Status:  full code  Family Communication:  girlfriend and mother at the bedside  Disposition Plan:  awaiting pathology, which will determine further workup   Consultants:   neurosurgery  Procedures:   status post decompression/laminectomy done 10/27  Antibiotics:   none   Objective: BP 138/89  Pulse 117  Temp(Src) 98.1 F (36.7 C) (Oral)  Resp 16  Ht 5\' 8"  (1.727 m)  Wt 94.3 kg (207 lb 14.3 oz)  BMI 31.62 kg/m2  SpO2 99%  Intake/Output Summary (Last 24 hours) at 03/24/14 1605 Last  data filed at 03/24/14 0421  Gross per 24 hour  Intake    243 ml  Output    380 ml  Net   -137 ml   Filed Weights   03/22/14 2007  Weight: 94.3 kg (207 lb 14.3 oz)    Exam:   General:   Alert and oriented 3, no acute distress  Cardiovascular:  regular rate and rhythm, S1-S2  Respiratory:  clear to auscultation bilaterally  Abdomen:  soft, nontender, nondistended, positive bowel sounds  Musculoskeletal:  no clubbing or cyanosis or edema.   Data Reviewed: Basic Metabolic Panel:  Recent Labs Lab 03/23/14 0123 03/23/14 0825 03/24/14 0811  NA 139 136* 135*  K 3.8 5.1 4.7  CL  --  99 100  CO2  --  23 22  GLUCOSE 151* 252* 320*  BUN  --  11 20  CREATININE  --  0.92 1.07  CALCIUM  --  8.7 8.9  MG  --  1.8  --    Liver Function Tests:  Recent Labs Lab 03/23/14 0825 03/24/14 0811  AST 23 17  ALT 25 21  ALKPHOS 47 51  BILITOT 0.5 <0.2*  PROT 6.9 6.5  ALBUMIN 3.6 3.3*   No results found for this basename: LIPASE, AMYLASE,  in the last 168 hours No results found for this basename: AMMONIA,  in the last 168 hours CBC:  Recent Labs Lab 03/23/14 0123 03/23/14 0825 03/24/14 0811  WBC  --  12.2* 17.1*  NEUTROABS  --  11.5* 15.7*  HGB 8.8* 13.1 12.0*  HCT 26.0* 37.6* 35.8*  MCV  --  74.9* 76.7*  PLT  --  141* 146*   Cardiac Enzymes:  No results found for this basename: CKTOTAL, CKMB, CKMBINDEX, TROPONINI,  in the last 168 hours BNP (last 3 results) No results found for this basename: PROBNP,  in the last 8760 hours CBG:  Recent Labs Lab 03/23/14 1200 03/23/14 1728 03/23/14 2144 03/24/14 0757 03/24/14 1149  GLUCAP 221* 229* 297* 316* 396*    Recent Results (from the past 240 hour(s))  SURGICAL PCR SCREEN     Status: None   Collection Time    03/22/14 10:24 PM      Result Value Ref Range Status   MRSA, PCR NEGATIVE  NEGATIVE Final   Staphylococcus aureus NEGATIVE  NEGATIVE Final   Comment:            The Xpert SA Assay (FDA     approved for  NASAL specimens     in patients over 84 years of age),     is one component of     a comprehensive surveillance     program.  Test performance has     been validated by [employer] for patients greater     than or equal to 58 year old.     It is not intended     to diagnose infection nor to     guide or monitor treatment.     Studies: Ct Chest W Contrast  03/23/2014   CLINICAL DATA:  One day post back surgery for tumor, workup for cancer of unknown primary  EXAM: CT CHEST, ABDOMEN, AND PELVIS WITH CONTRAST  TECHNIQUE: Multidetector CT imaging of the chest, abdomen and pelvis was performed following the standard protocol during bolus administration of intravenous contrast. Sagittal and coronal MPR images reconstructed from axial data set.  CONTRAST:  150mL OMNIPAQUE IOHEXOL 300 MG/ML SOLN IV. Dilute oral contrast.  COMPARISON:  None  FINDINGS: CT CHEST FINDINGS  Thoracic vascular structures grossly patent on nondedicated exam.  No thoracic adenopathy.  Tiny pulmonary nodule lingula image 32.  Dependent atelectasis in posterior lungs.  No infiltrate, pleural effusion, pneumothorax or additional mass/nodule.  Destructive lesion within the T3 vertebral body again identified with evidence of interval posterior upper thoracic decompression at T1-T3 and surgical drain at the dorsal aspect of the thecal sac.  CT ABDOMEN AND PELVIS FINDINGS  Malrotated kidneys bilaterally, hila directed slightly anteriorly.  Liver, spleen, pancreas, kidneys, and adrenal glands otherwise unremarkable.  Stomach contains food debris body and is inadequately opacified and distended, no gross mass.  Large and small bowel loops unremarkable.  Normal appendix.  Air in urinary bladder question related to prior instrumentation.  LEFT inguinal hernia containing fat.  No mass, adenopathy, free fluid or inflammatory process.  Umbilical hernia containing fat.  Degenerative disc disease changes at L4-L5.1  IMPRESSION: Destructive  lesion at T3 vertebral body.  Postsurgical changes at T1-T3.  No definite primary tumor identified.  Mildly malrotated kidneys.  Umbilical LEFT inguinal hernias.  Tiny nonspecific lingular pulmonary nodule; due to tumor at T3, recommend followup imaging in 6 months.   Electronically Signed   By: Lavonia Dana M.D.   On: 03/23/2014 16:17   Ct Abdomen Pelvis W Contrast  03/23/2014   CLINICAL DATA:  One day post back surgery for tumor, workup for cancer of unknown primary  EXAM: CT CHEST, ABDOMEN, AND PELVIS WITH CONTRAST  TECHNIQUE: Multidetector CT imaging of the chest, abdomen and pelvis was performed following the standard protocol during bolus administration of intravenous contrast. Sagittal and coronal MPR images reconstructed from axial  data set.  CONTRAST:  163mL OMNIPAQUE IOHEXOL 300 MG/ML SOLN IV. Dilute oral contrast.  COMPARISON:  None  FINDINGS: CT CHEST FINDINGS  Thoracic vascular structures grossly patent on nondedicated exam.  No thoracic adenopathy.  Tiny pulmonary nodule lingula image 32.  Dependent atelectasis in posterior lungs.  No infiltrate, pleural effusion, pneumothorax or additional mass/nodule.  Destructive lesion within the T3 vertebral body again identified with evidence of interval posterior upper thoracic decompression at T1-T3 and surgical drain at the dorsal aspect of the thecal sac.  CT ABDOMEN AND PELVIS FINDINGS  Malrotated kidneys bilaterally, hila directed slightly anteriorly.  Liver, spleen, pancreas, kidneys, and adrenal glands otherwise unremarkable.  Stomach contains food debris body and is inadequately opacified and distended, no gross mass.  Large and small bowel loops unremarkable.  Normal appendix.  Air in urinary bladder question related to prior instrumentation.  LEFT inguinal hernia containing fat.  No mass, adenopathy, free fluid or inflammatory process.  Umbilical hernia containing fat.  Degenerative disc disease changes at L4-L5.1  IMPRESSION: Destructive lesion at  T3 vertebral body.  Postsurgical changes at T1-T3.  No definite primary tumor identified.  Mildly malrotated kidneys.  Umbilical LEFT inguinal hernias.  Tiny nonspecific lingular pulmonary nodule; due to tumor at T3, recommend followup imaging in 6 months.   Electronically Signed   By: Lavonia Dana M.D.   On: 03/23/2014 16:17   Dg Thoracic Spine 1 View  03/23/2014   CLINICAL DATA:  Thoracic 2 through 4 laminectomy for tumor; history of thoracic metastatic disease.  EXAM: OPERATIVE THORACIC SPINE  VIEW(S)  COMPARISON:  None.  FINDINGS: Limited radiograph of the upper thoracic and lower cervical spine. Extensive hardware overlies the upper chest resulting in obscuration, surgical instruments at the level of T3/4. Endotracheal tube tip projects immediately above the carina.  IMPRESSION: Limited intraoperative radiograph, surgical instruments at the level of T3-4.   Electronically Signed   By: Elon Alas   On: 03/23/2014 04:23    Scheduled Meds: . dexamethasone  4 mg Intravenous 4 times per day   Or  . dexamethasone  4 mg Oral 4 times per day  . docusate sodium  100 mg Oral BID  . insulin aspart  0-20 Units Subcutaneous TID WC  . insulin aspart  0-5 Units Subcutaneous QHS  . insulin glargine  5 Units Subcutaneous Daily  . ketorolac  30 mg Intravenous Q6H  . living well with diabetes book   Does not apply Once  . pantoprazole  40 mg Oral Q0600  . senna  1 tablet Oral BID  . sodium chloride  3 mL Intravenous Q12H    Continuous Infusions: . sodium chloride 250 mL (03/23/14 0342)     Time spent: 20 minutes  [facility] Pager [phone]. If 7PM-7AM, please contact night-coverage at [email or web address], password [facility] 03/24/2014, 4:05 PM  LOS: 2 days

## 2014-03-24 NOTE — Progress Notes (Signed)
Overall doing well. No new issues or problems. Pathology still pending. Neuro exam stable. Drain output remains moderately high. Continue drain for 1 more day.

## 2014-03-24 NOTE — Progress Notes (Signed)
Living well with diabetes book given to patient.

## 2014-03-24 NOTE — Progress Notes (Addendum)
Inpatient Diabetes Program Recommendations  AACE/ADA: New Consensus Statement on Inpatient Glycemic Control (2013)  Target Ranges:  Prepandial:   less than 140 mg/dL      Peak postprandial:   less than 180 mg/dL (1-2 hours)      Critically ill patients:  140 - 180 mg/dL    Results for Gary Conway, Gary Conway (MRN [id number]) as of 03/24/2014 12:39  Ref. Range 03/24/2014 07:57 03/24/2014 11:49  Glucose-Capillary Latest Range: 70-99 mg/dL 316 (H) 396 (H)    Results for Gary Conway, Gary Conway (MRN [id number]) as of 03/24/2014 12:39  Ref. Range 03/23/2014 08:25  Hemoglobin A1C Latest Range: <5.7 % 7.0 (H)     POD #2 Laminectomy for thoracic tumor.  Having elevated blood glucose levels.  Current Insulin Orders= Lantus 5 units daily (started today)       Novolog Resistant SSI tid ac + HS (started yesterday at 12 noon)   MD- Note that A1c is 7%.  This is indicative of a positive diagnosis of DM per ADA Standards of Care.  Has anyone addressed patient's elevated A1c with him yet?  If this is a new diagnosis of DM, patient will need basic DM education prior to discharge.   Will follow Wyn Quaker RN, MSN, CDE Diabetes Coordinator Inpatient Diabetes Program Team Pager: [phone] (8a-10p)   Addendum 1530: Spoke with patient about his elevated A1c of 7%.  Explained what an A1c is and what it measures.  Patient told me that Dr. [place] Pink told him (patient) that he has Pre-DM and that he will need to take oral medications at home and change his diet.  Discussed with patient what pre-DM and diabetes is and asked patient about his food intake at home.  Patient stated to me he tries to eat healthy foods but drinks a lot of beverages with sugar (sweet tea, regular mountain dew, lots of fruit juice, etc.).  Encouraged patient to eliminate these beverages from his diet and explained to patient how beverages with sugar adversely affect one's blood sugars.  Patient seemed resistant to stop drinking  beverages with sugar.  Ordered Living Well With DM booklet for patient so he can read about what DM is and how to stay healthy at home.

## 2014-03-25 LAB — GLUCOSE, CAPILLARY
GLUCOSE-CAPILLARY: 302 mg/dL — AB (ref 70–99)
Glucose-Capillary: 261 mg/dL — ABNORMAL HIGH (ref 70–99)
Glucose-Capillary: 211 mg/dL — ABNORMAL HIGH (ref 70–99)
Glucose-Capillary: 306 mg/dL — ABNORMAL HIGH (ref 70–99)
Glucose-Capillary: 311 mg/dL — ABNORMAL HIGH (ref 70–99)
Glucose-Capillary: 267 mg/dL — ABNORMAL HIGH (ref 70–99)

## 2014-03-25 LAB — URINE CULTURE
COLONY COUNT: NO GROWTH
Culture: NO GROWTH

## 2014-03-25 MED ORDER — STARCH (THICKENING) PO POWD
ORAL | Status: DC | PRN
  Filled 2014-03-25: qty 227

## 2014-03-25 MED ORDER — INSULIN GLARGINE 100 UNIT/ML ~~LOC~~ SOLN
7.0000 [IU] | Freq: Every day | SUBCUTANEOUS | Status: DC
Start: 2014-03-25 — End: 2014-03-25
  Administered 2014-03-25: 7 [IU] via SUBCUTANEOUS
  Filled 2014-03-25: qty 0.07

## 2014-03-25 MED ORDER — INSULIN GLARGINE 100 UNIT/ML ~~LOC~~ SOLN
3.0000 [IU] | SUBCUTANEOUS | Status: AC
  Administered 2014-03-25: 3 [IU] via SUBCUTANEOUS
  Filled 2014-03-25 (×3): qty 0.03

## 2014-03-25 MED ORDER — INSULIN GLARGINE 100 UNIT/ML ~~LOC~~ SOLN
10.0000 [IU] | Freq: Every day | SUBCUTANEOUS | Status: DC
  Administered 2014-03-26 – 2014-03-27 (×2): 10 [IU] via SUBCUTANEOUS
  Filled 2014-03-25 (×2): qty 0.1

## 2014-03-25 NOTE — Progress Notes (Signed)
Rehab Admissions Coordinator Note:  Patient was screened by Cleatrice Burke for appropriateness for an Inpatient Acute Rehab Consult per PT recommendation.  At this time, we are recommending HH. P tnot in need of more intense therapies with PT and OT recommends no OT follow up.   Cleatrice Burke 03/25/2014, 2:52 PM  I can be reached at [phone].

## 2014-03-25 NOTE — Progress Notes (Signed)
Physical Therapy Treatment Patient Details Name: Gary Conway MRN: [id number] DOB: [birth year] Today's Date: 03/25/2014    History of Present Illness Pt admitted for progressive weakness and parasthesias in bil LE's.  Images showed  thoracic mass in area of T3., pt s/p laminectomy and microdiscectomy with resection of tumor at T3.    PT Comments    Pt admitted with above. Pt currently with functional limitations due to balance and endurance deficits.  Pt may be able to go home with HHPT Conway/u if continues to progress and not need Rehab.  Pt will benefit from skilled PT to increase their independence and safety with mobility to allow discharge to the venue listed below.   Follow Up Recommendations  CIR;Supervision for mobility/OOB     Equipment Recommendations  Rolling walker with 5" wheels;3in1 (PT)    Recommendations for Other Services Rehab consult     Precautions / Restrictions Precautions Precautions: Fall;Back Precaution Booklet Issued: Yes (comment) Precaution Comments: Educated pt on back precautions and incorporating into ADLs.  Restrictions Weight Bearing Restrictions: No    Mobility  Bed Mobility Overal bed mobility: Needs Assistance Bed Mobility: Sidelying to Sit;Rolling Rolling: Min guard       Sit to sidelying: Min guard General bed mobility comments: VC's for technique.   Transfers Overall transfer level: Needs assistance Equipment used: Rolling walker (2 wheeled) Transfers: Sit to/from Stand Sit to Stand: Supervision         General transfer comment: Supervision for safety due to LE weakess and spasms. Good hand placement  Ambulation/Gait Ambulation/Gait assistance: Min guard Ambulation Distance (Feet): 250 Feet Assistive device: Rolling walker (2 wheeled) Gait Pattern/deviations: Step-through pattern;Decreased step length - right;Decreased step length - left Gait velocity: slow Gait velocity interpretation: Below normal speed for  age/gender General Gait Details: stiff-legged gait with heavy use of arms, but smoother gait as progressed.At times appeared unsteady but pt did not lose balance.  Occasional incr sway to left and right.     Stairs            Wheelchair Mobility    Modified Rankin (Stroke Patients Only)       Balance Overall balance assessment: Needs assistance Sitting-balance support: No upper extremity supported;Feet supported Sitting balance-Leahy Scale: Good     Standing balance support: Bilateral upper extremity supported;During functional activity Standing balance-Leahy Scale: Poor Standing balance comment: Pt requires use of RW for standing.                      Cognition Arousal/Alertness: Awake/alert Behavior During Therapy: WFL for tasks assessed/performed Overall Cognitive Status: Within Functional Limits for tasks assessed                      Exercises      General Comments General comments (skin integrity, edema, etc.): Again educared pt mom and girlfriend re back precautions and log roll.        Pertinent Vitals/Pain Pain Assessment: No/denies pain Faces Pain Scale: Hurts even more Pain Location: upper back between shoulder blades Pain Descriptors / Indicators: Cramping;Spasm Pain Intervention(s): Limited activity within patient's tolerance;Monitored during session;Premedicated before session;Repositioned VSS    Home Living                      Prior Function            PT Goals (current goals can now be found in the care plan section) Progress towards PT goals: Progressing  toward goals    Frequency  Min 5X/week    PT Plan Current plan remains appropriate    Co-evaluation             End of Session Equipment Utilized During Treatment: Gait belt Activity Tolerance: Patient tolerated treatment well Patient left: in chair;with call bell/phone within reach;with family/visitor present     Time: 1222-1235 PT Time  Calculation (min): 13 min  Charges:  $Gait Training: 8-22 mins                    G Codes:      Gary Conway, Gary Conway 04/17/2014, 1:30 PM [employer] [facility] [phone] (pager)

## 2014-03-25 NOTE — Progress Notes (Signed)
Physical medicine and rehabilitation consult requested chart reviewed. Patient is progressing nicely 250 feet minimal assist with a rolling walker. He should continue to progress in all areas of mobility and will not need inpatient rehabilitation services with recommendations of home health therapies. Hold on formal rehabilitation consult this time

## 2014-03-25 NOTE — Progress Notes (Signed)
PROGRESS NOTE  Gary Conway NLG:[id number] DOB: 25-Sep-1975 DOA: 03/22/2014 PCP: Elizabeth Palau, MD  HPI/Recap of past 41 hours: 38 year old obese male with no past medical history presented to the hospital after several months of worsening weakness and paresthesias and found to have area concerning for metastatic lesion to the thoracic spine. Patient status post decompression and laminectomy being neurosurgery. Pathology pending. Started on steroids and noted of hyperglycemia with workup revealing A1c of 7.0. Started on Lantus on 10/28. Drain removed 10/29 morning  Patient feels fatigued, but otherwise okay.  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 Patient and family understandably quite stressed about possibility of cancer.  Spoke with pathology: Tumor itself appears to be vascular in nature and possibly maybe a hemangioma. There are some cells which looked abnormal so pathology is re-reviewing to decide on malignant versus benign. They will keep [place] updated. Active Problems:   Numbness and tingling of both legs + weakness, bilateral: Secondary to above. Improved status post surgery. seen by physical and occupational therapy will continue to work with patient who are recommending inpatient rehabilitation. Will consult    Leukocytosis: Secondary to stress margination plus steroids. No signs of fever or infection.    Obesity (BMI 30-39.9) :patient meets criteria with BMI greater than 30.    DM (diabetes mellitus), type 2, uncontrolled: New diagnosis. Ordered outpatient education. Currently on Lantus plus sliding scale given hyperglycemia from steroids. CBGs still elevated, so increased Lantus.  Code Status:  full code  Family Communication:  girlfriend and mother at the bedside  Disposition Plan:  awaiting pathology, which will determine further workup   Consultants:    neurosurgery  Procedures:   status post decompression/laminectomy done 10/27  Antibiotics:   none   Objective: BP 137/88  Pulse 107  Temp(Src) 98.7 F (37.1 C) (Oral)  Resp 18  Ht 5\' 8"  (1.727 m)  Wt 94.3 kg (207 lb 14.3 oz)  BMI 31.62 kg/m2  SpO2 96%  Intake/Output Summary (Last 24 hours) at 03/25/14 1434 Last data filed at 03/25/14 1015  Gross per 24 hour  Intake    480 ml  Output    450 ml  Net     30 ml   Filed Weights   03/22/14 2007  Weight: 94.3 kg (207 lb 14.3 oz)    Exam: Unchanged from previous day.  General:   Alert and oriented 3, no acute distress  Cardiovascular:  regular rate and rhythm, S1-S2  Respiratory:  clear to auscultation bilaterally  Abdomen:  soft, nontender, nondistended, positive bowel sounds  Musculoskeletal:  no clubbing or cyanosis or edema.   Data Reviewed: Basic Metabolic Panel:  Recent Labs Lab 03/23/14 0123 03/23/14 0825 03/24/14 0811  NA 139 136* 135*  K 3.8 5.1 4.7  CL  --  99 100  CO2  --  23 22  GLUCOSE 151* 252* 320*  BUN  --  11 20  CREATININE  --  0.92 1.07  CALCIUM  --  8.7 8.9  MG  --  1.8  --    Liver Function Tests:  Recent Labs Lab 03/23/14 0825 03/24/14 0811  AST 23 17  ALT 25 21  ALKPHOS 47 51  BILITOT 0.5 <0.2*  PROT 6.9 6.5  ALBUMIN 3.6 3.3*   No results found for this basename: LIPASE, AMYLASE,  in the last 168 hours No results found for this basename: AMMONIA,  in the last 168 hours CBC:  Recent Labs Lab 03/23/14 0123 03/23/14 0825 03/24/14 0811  WBC  --  12.2* 17.1*  NEUTROABS  --  11.5* 15.7*  HGB 8.8* 13.1 12.0*  HCT 26.0* 37.6* 35.8*  MCV  --  74.9* 76.7*  PLT  --  141* 146*   Cardiac Enzymes:   No results found for this basename: CKTOTAL, CKMB, CKMBINDEX, TROPONINI,  in the last 168 hours BNP (last 3 results) No results found for this basename: PROBNP,  in the last 8760 hours CBG:  Recent Labs Lab 03/24/14 1149 03/24/14 1659 03/24/14 2252 03/25/14 0807  03/25/14 1151  GLUCAP 396* 294* 261* 211* 306*    Recent Results (from the past 240 hour(s))  SURGICAL PCR SCREEN     Status: None   Collection Time    03/22/14 10:24 PM      Result Value Ref Range Status   MRSA, PCR NEGATIVE  NEGATIVE Final   Staphylococcus aureus NEGATIVE  NEGATIVE Final   Comment:            The Xpert SA Assay (FDA     approved for NASAL specimens     in patients over 60 years of age),     is one component of     a comprehensive surveillance     program.  Test performance has     been validated by [employer] for patients greater     than or equal to 10 year old.     It is not intended     to diagnose infection nor to     guide or monitor treatment.  URINE CULTURE     Status: None   Collection Time    03/23/14  4:00 PM      Result Value Ref Range Status   Specimen Description URINE, CLEAN CATCH   Final   Special Requests NONE   Final   Culture  Setup Time     Final   Value: 03/23/2014 17:10     Performed at [place]     Final   Value: NO GROWTH     Performed at [facility]   Culture     Final   Value: NO GROWTH     Performed at [facility]   Report Status 03/25/2014 FINAL   Final     Studies: No results found.  Scheduled Meds: . dexamethasone  4 mg Intravenous 4 times per day   Or  . dexamethasone  4 mg Oral 4 times per day  . docusate sodium  100 mg Oral BID  . insulin aspart  0-20 Units Subcutaneous TID WC  . insulin aspart  0-5 Units Subcutaneous QHS  . insulin glargine  7 Units Subcutaneous Daily  . ketorolac  30 mg Intravenous Q6H  . pantoprazole  40 mg Oral Q0600  . senna  1 tablet Oral BID  . sodium chloride  3 mL Intravenous Q12H    Continuous Infusions: . sodium chloride 250 mL (03/23/14 0342)     Time spent: 20 minutes  [place] [facility] Pager [phone]. If 7PM-7AM, please contact night-coverage at [email or web address], password [email or web address] 03/25/2014, 2:34 PM   LOS: 3 days

## 2014-03-25 NOTE — Progress Notes (Signed)
Occupational Therapy Treatment Patient Details Name: Gary Conway MRN: [id number] DOB: [birth year] Today's Date: 03/25/2014    History of present illness Pt admitted for progressive weakness and parasthesias in bil LE's.  Images showed  thoracic mass in area of T3., pt s/p laminectomy and microdiscectomy with resection of tumor at T3.   OT comments  Pt seen today for functional mobility and safety with ADLs. Pt reports that Bil thighs and knees feel swollen and weak which impair his functional mobility. Tub transfer discussed and education provided. Pt will continue to benefit from acute OT to address safety and independence with ADLs.   Follow Up Recommendations  No OT follow up;Supervision - Intermittent    Equipment Recommendations  Tub/shower bench    Recommendations for Other Services      Precautions / Restrictions Precautions Precautions: Fall;Back Precaution Booklet Issued: Yes (comment) Precaution Comments: Educated pt on back precautions and incorporating into ADLs.  Restrictions Weight Bearing Restrictions: No       Mobility Bed Mobility Overal bed mobility: Needs Assistance Bed Mobility: Sit to Sidelying;Rolling Rolling: Supervision       Sit to sidelying: Min guard General bed mobility comments: VC's for technique. Pt required additional time to return LEs to bed during transition.   Transfers Overall transfer level: Needs assistance Equipment used: Rolling walker (2 wheeled) Transfers: Sit to/from Stand Sit to Stand: Supervision         General transfer comment: Supervision for safety due to LE weakess and spasms. Good hand placement        ADL Overall ADL's : Needs assistance/impaired     Grooming: Supervision/safety;Standing                   Toilet Transfer: Supervision/safety;RW       Tub/ Shower Transfer: Min Engineer, drilling Details (indicate cue type and reason): Demonstrated safe tub transfer  technique with pt return demo. Pt reports he has a deep garden tub and encouraged pt to perform sponge bath until he feels that his mobility has improved enough to attempt tub transfer with assistance.  Functional mobility during ADLs: Supervision/safety;Rolling walker General ADL Comments: Pt continues to require Supervision for safety due to reported "shakiness" when walking from Bil LE spasms, weakness, and swelling.                 Cognition  Arousal/Alertness: Awake/Alert Behavior During Therapy: WFL for tasks assessed/performed Overall Cognitive Status: Within Functional Limits for tasks assessed                                    Pertinent Vitals/ Pain       Pain Assessment: No/denies pain         Frequency Min 2X/week     Progress Toward Goals  OT Goals(current goals can now be found in the care plan section)  Progress towards OT goals: Progressing toward goals     Plan Discharge plan remains appropriate       End of Session Equipment Utilized During Treatment: Rolling walker   Activity Tolerance Patient tolerated treatment well   Patient Left in bed;with call bell/phone within reach;with family/visitor present   Nurse Communication          Time: 4098-1191 OT Time Calculation (min): 14 min  Charges: OT General Charges $OT Visit: 1 Procedure OT Treatments $Therapeutic Activity: 8-22 mins  Villa Herb M 03/25/2014, 11:51 AM  Cyndie Chime, OTR/L Occupational Therapist [phone] (pager)

## 2014-03-25 NOTE — Progress Notes (Signed)
Hemovac drain removed with ginger gleason RN per MD order. Dry clean dressing intact. Pt tolerated well. Will continue to monitor site.

## 2014-03-26 LAB — TYPE AND SCREEN
ABO/RH(D): O POS
Antibody Screen: NEGATIVE
UNIT DIVISION: 0
UNIT DIVISION: 0
UNIT DIVISION: 0
Unit division: 0

## 2014-03-26 LAB — GLUCOSE, CAPILLARY
GLUCOSE-CAPILLARY: 184 mg/dL — AB (ref 70–99)
GLUCOSE-CAPILLARY: 325 mg/dL — AB (ref 70–99)
GLUCOSE-CAPILLARY: 295 mg/dL — AB (ref 70–99)
GLUCOSE-CAPILLARY: 366 mg/dL — AB (ref 70–99)

## 2014-03-26 MED ORDER — PREDNISONE 50 MG PO TABS
50.0000 mg | ORAL_TABLET | Freq: Two times a day (BID) | ORAL | Status: DC
  Administered 2014-03-26 – 2014-03-27 (×2): 50 mg via ORAL
  Filled 2014-03-26 (×4): qty 1

## 2014-03-26 NOTE — Progress Notes (Signed)
Physical Therapy Treatment Patient Details Name: Gary Conway MRN: [id number] DOB: 01/28/1976 Today's Date: 03/26/2014    History of Present Illness Pt admitted for progressive weakness and parasthesias in bil LE's.  Images showed  thoracic mass in area of T3., pt s/p laminectomy and microdiscectomy with resection of tumor at T3.    PT Comments    Progressing steadily.  Pt reinforced on all back precautions incl lifting restrictions, bed mobility and progression of activity.   Follow Up Recommendations  Home health PT (not accepted by CIR)     Equipment Recommendations  Rolling walker with 5" wheels;3in1 (PT)    Recommendations for Other Services       Precautions / Restrictions Precautions Precautions: Fall;Back Precaution Booklet Issued: Yes (comment) Precaution Comments: Educated pt on back precautions and incorporating into ADLs.     Mobility  Bed Mobility Overal bed mobility: Needs Assistance Bed Mobility: Rolling;Sidelying to Sit;Sit to Sidelying Rolling: Supervision Sidelying to sit: Supervision     Sit to sidelying: Supervision General bed mobility comments: VC's for technique.   Transfers Overall transfer level: Needs assistance Equipment used: Rolling walker (2 wheeled) Transfers: Sit to/from Stand Sit to Stand: Supervision         General transfer comment: safet technique  Ambulation/Gait Ambulation/Gait assistance: Supervision Ambulation Distance (Feet): 240 Feet Assistive device: Rolling walker (2 wheeled) Gait Pattern/deviations: Step-through pattern Gait velocity: slow   General Gait Details: steadier gait with less use of arms for support.   Stairs Stairs: Yes Stairs assistance: Supervision Stair Management: One rail Right;One rail Left;Step to pattern;Forwards Number of Stairs: 9 General stair comments: steady with rail  Wheelchair Mobility    Modified Rankin (Stroke Patients Only)       Balance Overall balance  assessment: Needs assistance Sitting-balance support: No upper extremity supported Sitting balance-Leahy Scale: Good     Standing balance support: No upper extremity supported;Bilateral upper extremity supported Standing balance-Leahy Scale: Fair Standing balance comment: does not accept challenge.                    Cognition Arousal/Alertness: Awake/alert Behavior During Therapy: WFL for tasks assessed/performed Overall Cognitive Status: Within Functional Limits for tasks assessed                      Exercises      General Comments        Pertinent Vitals/Pain Pain Assessment: No/denies pain Faces Pain Scale: Hurts little more Pain Location: upper back Pain Descriptors / Indicators: Nagging Pain Intervention(s): Monitored during session    Home Living                      Prior Function            PT Goals (current goals can now be found in the care plan section) Acute Rehab PT Goals Patient Stated Goal: Dance and play basketball  PT Goal Formulation: With patient Time For Goal Achievement: 04/07/14 Potential to Achieve Goals: Good Progress towards PT goals: Progressing toward goals    Frequency  Min 5X/week    PT Plan Current plan remains appropriate    Co-evaluation             End of Session Equipment Utilized During Treatment: Gait belt Activity Tolerance: Patient tolerated treatment well Patient left: in bed;with call bell/phone within reach;with family/visitor present     Time: 1251-1305 PT Time Calculation (min): 14 min  Charges:  $Gait Training: 8-22  mins                    G Codes:      Chiquitta Matty, Tessie Fass 03/26/2014, 1:15 PM 03/26/2014  Donnella Sham, PT [phone]  (pager)

## 2014-03-26 NOTE — Progress Notes (Signed)
PROGRESS NOTE  Gary Conway MGQ:[id number] DOB: [birth year] DOA: 03/22/2014 PCP: Elizabeth Palau, MD  HPI/Recap of past 69 hours: 38 year old obese male with no past medical history presented to the hospital after several months of worsening weakness and paresthesias and found to have area concerning for metastatic lesion to the thoracic spine. Patient status post decompression and laminectomy being neurosurgery. Pathology pending. Started on steroids and noted of hyperglycemia with workup revealing A1c of 7.0. Started on Lantus on 10/28. Drain removed 10/29 morning  Patient feels fatigued, but otherwise okay.  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 Patient and family understandably quite stressed about possibility of cancer.  Spoke with pathology: Tumor itself appears to be vascular in nature and possibly maybe a hemangioma. There are some cells which looked abnormal so pathology is re-reviewing to decide on malignant versus benign. They will keep [place] updated. Active Problems:   Numbness and tingling of both legs + weakness, bilateral: Secondary to above. Improved status post surgery. seen by physical and occupational therapy and home health PT plus equipment set up    Leukocytosis: Secondary to stress margination plus steroids. No signs of fever or infection.    Obesity (BMI 30-39.9) :patient meets criteria with BMI greater than 30.    DM (diabetes mellitus), type 2, uncontrolled: New diagnosis. Ordered outpatient education. Currently on Lantus plus sliding scale given hyperglycemia from steroids. CBGs still elevated, so increased Lantus.  Code Status:  full code  Family Communication:  girlfriend and mother at the bedside  Disposition Plan:  awaiting pathology, which will determine further workup   Consultants:   neurosurgery  Procedures:   status post  decompression/laminectomy done 10/27  Antibiotics:   none   Objective: BP 127/75  Pulse 117  Temp(Src) 98.1 F (36.7 C) (Oral)  Resp 20  Ht 5\' 8"  (1.727 m)  Wt 94.3 kg (207 lb 14.3 oz)  BMI 31.62 kg/m2  SpO2 99%  Intake/Output Summary (Last 24 hours) at 03/26/14 1454 Last data filed at 03/26/14 0900  Gross per 24 hour  Intake    600 ml  Output    201 ml  Net    399 ml   Filed Weights   03/22/14 2007  Weight: 94.3 kg (207 lb 14.3 oz)    Exam: Strength continues to improve  General:   Alert and oriented 3, no acute distress  Cardiovascular:  regular rate and rhythm, S1-S2  Respiratory:  clear to auscultation bilaterally  Abdomen:  soft, nontender, nondistended, positive bowel sounds  Musculoskeletal:  no clubbing or cyanosis or edema.   Data Reviewed: Basic Metabolic Panel:  Recent Labs Lab 03/23/14 0123 03/23/14 0825 03/24/14 0811  NA 139 136* 135*  K 3.8 5.1 4.7  CL  --  99 100  CO2  --  23 22  GLUCOSE 151* 252* 320*  BUN  --  11 20  CREATININE  --  0.92 1.07  CALCIUM  --  8.7 8.9  MG  --  1.8  --    Liver Function Tests:  Recent Labs Lab 03/23/14 0825 03/24/14 0811  AST 23 17  ALT 25 21  ALKPHOS 47 51  BILITOT 0.5 <0.2*  PROT 6.9 6.5  ALBUMIN 3.6 3.3*   No results found for this basename: LIPASE, AMYLASE,  in the last 168 hours No results found for this basename: AMMONIA,  in the last 168 hours CBC:  Recent Labs Lab 03/23/14 0123 03/23/14  0825 03/24/14 0811  WBC  --  12.2* 17.1*  NEUTROABS  --  11.5* 15.7*  HGB 8.8* 13.1 12.0*  HCT 26.0* 37.6* 35.8*  MCV  --  74.9* 76.7*  PLT  --  141* 146*   Cardiac Enzymes:   No results found for this basename: CKTOTAL, CKMB, CKMBINDEX, TROPONINI,  in the last 168 hours BNP (last 3 results) No results found for this basename: PROBNP,  in the last 8760 hours CBG:  Recent Labs Lab 03/25/14 1656 03/25/14 2125 03/25/14 2302 03/26/14 0742 03/26/14 1132  GLUCAP 302* 311* 267* 184*  325*    Recent Results (from the past 240 hour(s))  SURGICAL PCR SCREEN     Status: None   Collection Time    03/22/14 10:24 PM      Result Value Ref Range Status   MRSA, PCR NEGATIVE  NEGATIVE Final   Staphylococcus aureus NEGATIVE  NEGATIVE Final   Comment:            The Xpert SA Assay (FDA     approved for NASAL specimens     in patients over 60 years of age),     is one component of     a comprehensive surveillance     program.  Test performance has     been validated by [employer] for patients greater     than or equal to 77 year old.     It is not intended     to diagnose infection nor to     guide or monitor treatment.  URINE CULTURE     Status: None   Collection Time    03/23/14  4:00 PM      Result Value Ref Range Status   Specimen Description URINE, CLEAN CATCH   Final   Special Requests NONE   Final   Culture  Setup Time     Final   Value: 03/23/2014 17:10     Performed at [place]     Final   Value: NO GROWTH     Performed at [facility]   Culture     Final   Value: NO GROWTH     Performed at [facility]   Report Status 03/25/2014 FINAL   Final     Studies: No results found.  Scheduled Meds: . dexamethasone  4 mg Intravenous 4 times per day   Or  . dexamethasone  4 mg Oral 4 times per day  . docusate sodium  100 mg Oral BID  . insulin aspart  0-20 Units Subcutaneous TID WC  . insulin aspart  0-5 Units Subcutaneous QHS  . insulin glargine  10 Units Subcutaneous Daily  . ketorolac  30 mg Intravenous Q6H  . pantoprazole  40 mg Oral Q0600  . senna  1 tablet Oral BID  . sodium chloride  3 mL Intravenous Q12H    Continuous Infusions: . sodium chloride 250 mL (03/23/14 0342)     Time spent: 15 minutes  [place] [facility] Pager [phone]. If 7PM-7AM, please contact night-coverage at [email or web address], password [facility] 03/26/2014, 2:54 PM  LOS: 4 days

## 2014-03-26 NOTE — Progress Notes (Signed)
Overall doing well. No significant pain. Continues to make good progress with regaining lower extremity function.  On examination he is awake and alert. He is oriented and appropriate. Motor function 4+ over 5 in both lower extremities. Still some spasticity left greater than right but overall very much improved from preop. Patient voiding well. Standing and ambulating with therapy. Wound clean and dry.  Pathology still pending. Apparently they're waiting on some additional studies. They definitely feel they have pathologic tissue.  Overall doing well. Awaiting pathology with regard to adjuvant treatment decision. Continue if his immobilization.

## 2014-03-26 NOTE — Progress Notes (Signed)
Inpatient Diabetes Program Recommendations  AACE/ADA: New Consensus Statement on Inpatient Glycemic Control (2013)  Target Ranges:  Prepandial:   less than 140 mg/dL      Peak postprandial:   less than 180 mg/dL (1-2 hours)      Critically ill patients:  140 - 180 mg/dL     Spoke with Dr. Maryland Pink about this patient.  Per Dr. Maryland Pink, patient will be discharged home on Metformin bid.  Spoke with patient about plans for discharging him home on Metformin.  Discussed what Metformin is, how it works, how to take, side effects, etc.  Encouraged patient to follow up with his PCP soon after discharge to further discuss his blood sugar levels and have his A1c rechecked.  Patient wanted to know if he would have to take Metformin the rest of his life.  Explained to patient that if he makes lifestyle changes at home (increases exercise, eliminates beverages with sugar from his diet, monitors carbohydrate intake) that he may not need to take the Metformin, however, explained to patient that he needs to follow closely with his PCP for blood sugar control and that he should not stop the Metformin without consulting his PCP first.   Will follow Wyn Quaker RN, MSN, CDE Diabetes Coordinator Inpatient Diabetes Program Team Pager: [phone] (8a-10p)

## 2014-03-26 NOTE — Care Management Note (Signed)
    Page 1 of 2   03/26/2014     2:40:13 PM CARE MANAGEMENT NOTE 03/26/2014  Patient:  Gary Conway, Gary Conway   Account Number:  [id number]  Date Initiated:  03/26/2014  Documentation initiated by:  Tomi Bamberger  Subjective/Objective Assessment:   dx thoracic epidural tumor with myelopathy.  admit- from home     Action/Plan:   pt eval- rec hhpt   Anticipated DC Date:  03/27/2014   Anticipated DC Plan:  [place]  CM consult      [facility] Choice  HOME HEALTH   Choice offered to / List presented to:  C-1 Patient   DME arranged  Vassie Moselle      DME agency  [place] arranged  [facility]   Status of service:  Completed, signed off Medicare Important Message given?  NO (If response is "NO", the following Medicare IM given date fields will be blank) Date Medicare IM given:   Medicare IM given by:   Date Additional Medicare IM given:   Additional Medicare IM given by:    Discharge Disposition:  [place]  Per UR Regulation:  Reviewed for med. necessity/level of care/duration of stay  If discussed at [place] of Stay Meetings, dates discussed:    Comments:  03/26/14 Ava, BSN [phone] patient for possible dc tomorrow, per pt eval rec hhpt, Patient will be going to stay with Anderson [facility] at [facility] [address] [place] [address], phone [phone]. Patient chose [facility], [facility] at [phone] for hhpt.  They will be able to take patient.  Patient chose [facility] for dme for rolling walker, Jermaine brought up to patient's room.

## 2014-03-27 DIAGNOSIS — D334 Benign neoplasm of spinal cord: Secondary | ICD-10-CM

## 2014-03-27 HISTORY — DX: Benign neoplasm of spinal cord: D33.4

## 2014-03-27 LAB — GLUCOSE, CAPILLARY
GLUCOSE-CAPILLARY: 189 mg/dL — AB (ref 70–99)
Glucose-Capillary: 190 mg/dL — ABNORMAL HIGH (ref 70–99)

## 2014-03-27 MED ORDER — METHYLPREDNISOLONE (PAK) 4 MG PO TABS: ORAL_TABLET | ORAL | Status: DC

## 2014-03-27 MED ORDER — SIMETHICONE 80 MG PO CHEW
80.0000 mg | CHEWABLE_TABLET | Freq: Four times a day (QID) | ORAL | Status: DC | PRN
  Filled 2014-03-27: qty 1

## 2014-03-27 MED ORDER — METFORMIN HCL 500 MG PO TABS: 500.0000 mg | ORAL_TABLET | Freq: Two times a day (BID) | ORAL | Status: DC

## 2014-03-27 MED ORDER — OXYCODONE HCL 5 MG PO TABS: 5.0000 mg | ORAL_TABLET | ORAL | Status: DC | PRN

## 2014-03-27 NOTE — Progress Notes (Signed)
Patient ID: Gary Conway, male   DOB: 01-Jul-1975, 38 y.o.   MRN: [id number] Doing well improved with pain and neurologic function is lower 70s  Incision clean dry and intact DC'd his bandage  Okay to discharge from our perspective

## 2014-03-27 NOTE — Discharge Instructions (Signed)

## 2014-03-27 NOTE — Discharge Summary (Signed)
Discharge Summary  Gary Conway NTZ:[id number] DOB: 09/01/1975  PCP: Elizabeth Palau, MD  Admit date: 03/22/2014 Discharge date: 03/27/2014  Time spent: 25 minutes  Recommendations for Outpatient Follow-up:  1. Patient will follow up with Dr. Tammi Klippel, radiation oncology-appointment to be set up on Monday 11/2 2. Patient will follow up with Dr. Trenton Gammon neurosurgery in the next 2 weeks 3. New medication for new diagnosis of diabetes mellitus: Metformin 500 by mouth twice a day 4. New medication: Medrol dose pack use as directed until completed 5. New medication: Oxy IR 5 mg by mouth every 6 hours when necessary for pain 6. Patient going home with home health physical therapy plus equipment   Discharge Diagnoses:  Active Hospital Problems   Diagnosis Date Noted  . 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 Obesity (BMI 30-39.9) 03/24/2014  . DM (diabetes mellitus), type 2, uncontrolled 03/24/2014  . Leukocytosis 03/23/2014  . Leg weakness, bilateral 03/22/2014  . Numbness and tingling of both legs 03/22/2014    Resolved Hospital Problems   Diagnosis Date Noted Date Resolved  No resolved problems to display.    Discharge Condition: Improved, being discharged home  Diet recommendation: Carb modified  Filed Weights   03/22/14 2007  Weight: 94.3 kg (207 lb 14.3 oz)    History of present illness:  38 year old obese male with no past medical history presented to the hospital after several months of worsening weakness and paresthesias and found to have area concerning for metastatic lesion to the thoracic spine. Patient status post decompression and laminectomy being neurosurgery. Pathology pending. Started on steroids and noted of hyperglycemia    Hospital Course:  Principal Problem:   Benign neoplasm to epidural space with unknown primary site presenting as   Numbness and tingling of both legs   Leg weakness, bilateral:  Pathology sent and after several days of close review, fortunately looked to be more of a benign appearing tumor such as a hemangioma. This was discussed with neurosurgery who were able to get some of the tumor off enough for pathology, but not the entire mass. It was recommended that patient could be discharged home with Medrol dose pack and receive stereotactic radiation to eradicate remaining tumor. Patient evaluated by physical therapy recommended home health PT with no OT follow-up recommended plus equipment. Active Problems:    Leukocytosis: Patient with minimally elevated white blood cell count on admission likely from stress margination. This white count went up dramatically once he was started on steroids. No signs of acute infection.    Obesity (BMI 30-39.9): Patient needs criteria with BMI greater than 30.    DM (diabetes mellitus), type 2, uncontrolled: Patient's A1c at 40.0 indicative of diabetes even prior to being on steroids. Patient required Lantus plus sliding scale during hospitalization because of large dose of steroids. Fortunately, with low A1c, patient will be discharged just on metformin 500 twice a day as well as Medrol dose pack so steroids will be tapered off her sugar should come down nicely. Have encouraged the patient that with exercise and weight loss, he may be able to have his diabetes eventually controlled without medication, but for now needs it. His PCP will follow and manage this.   Procedures:  Status post decompression and laminectomy done 10/27  Consultations:  Neurosurgery  Discharge Exam: BP 133/96  Pulse 9  Temp(Src) 98 F (36.7 C) (Oral)  Resp 17  Ht 5\' 8"  (1.727 m)  Wt 94.3 kg (207 lb 14.3  oz)  BMI 31.62 kg/m2  SpO2 99%  General: Alert and oriented 3, no acute distress Cardiovascular: Regular rate and rhythm, S1-S2 Respiratory: Clear to auscultation bilaterally  Discharge Instructions You were cared for by a hospitalist during your  hospital stay. If you have any questions about your discharge medications or the care you received while you were in the hospital after you are discharged, you can call the unit and asked to speak with the hospitalist on call if the hospitalist that took care of you is not available. Once you are discharged, your primary care physician will handle any further medical issues. Please note that NO REFILLS for any discharge medications will be authorized once you are discharged, as it is imperative that you return to your primary care physician (or establish a relationship with a primary care physician if you do not have one) for your aftercare needs so that they can reassess your need for medications and monitor your lab values.  Discharge Instructions   Ambulatory referral to Nutrition and Diabetic Education    Complete by:  As directed   New diagnosis of DM2 per Dr. Maryland Pink (see Progress note from 10/28).  A1c 7%.  Likely d/c home on Metformin bid.  Please wait to call patient to schedule appointment until after discharge.  Thanks!     Diet Carb Modified    Complete by:  As directed      Increase activity slowly    Complete by:  As directed             Medication List    STOP taking these medications       ibuprofen 200 MG tablet  Commonly known as:  ADVIL,MOTRIN      TAKE these medications       cyclobenzaprine 5 MG tablet  Commonly known as:  FLEXERIL  Take 5 mg by mouth 3 (three) times daily as needed for muscle spasms.     metFORMIN 500 MG tablet  Commonly known as:  GLUCOPHAGE  Take 1 tablet (500 mg total) by mouth 2 (two) times daily with a meal.     methylPREDNIsolone 4 MG tablet  Commonly known as:  MEDROL DOSPACK  follow package directions     naproxen 500 MG tablet  Commonly known as:  NAPROSYN  Take 1 tablet (500 mg total) by mouth 2 (two) times daily with a meal.     oxyCODONE 5 MG immediate release tablet  Commonly known as:  Oxy IR/ROXICODONE  Take 1 tablet (5  mg total) by mouth every 4 (four) hours as needed for severe pain.       No Known Allergies     Follow-up Information   Follow up with Elizabeth Palau, MD. (As needed)    Specialty:  Family Medicine   Contact information:   [place] [address] [phone]       Follow up with POOL,HENRY A, MD. Schedule an appointment as soon as possible for a visit in 2 weeks.   Specialty:  Neurosurgery   Contact information:   [address] [place] [address] [phone]       Follow up with Lora Paula, MD. (They will call you on Monday)    Specialty:  Radiation Oncology   Contact information:   [place] [address] [phone]        The results of significant diagnostics from this hospitalization (including imaging, microbiology, ancillary  and laboratory) are listed below for reference.    Significant Diagnostic Studies: Ct Chest W Contrast  03/23/2014   CLINICAL DATA:  One day post back surgery for tumor, workup for cancer of unknown primary  EXAM: CT CHEST, ABDOMEN, AND PELVIS WITH CONTRAST  TECHNIQUE: Multidetector CT imaging of the chest, abdomen and pelvis was performed following the standard protocol during bolus administration of intravenous contrast. Sagittal and coronal MPR images reconstructed from axial data set.  CONTRAST:  128mL OMNIPAQUE IOHEXOL 300 MG/ML SOLN IV. Dilute oral contrast.  COMPARISON:  None  FINDINGS: CT CHEST FINDINGS  Thoracic vascular structures grossly patent on nondedicated exam.  No thoracic adenopathy.  Tiny pulmonary nodule lingula image 32.  Dependent atelectasis in posterior lungs.  No infiltrate, pleural effusion, pneumothorax or additional mass/nodule.  Destructive lesion within the T3 vertebral body again identified with evidence of interval posterior upper thoracic decompression at T1-T3 and surgical drain at the dorsal aspect of the thecal sac.  CT ABDOMEN AND  PELVIS FINDINGS  Malrotated kidneys bilaterally, hila directed slightly anteriorly.  Liver, spleen, pancreas, kidneys, and adrenal glands otherwise unremarkable.  Stomach contains food debris body and is inadequately opacified and distended, no gross mass.  Large and small bowel loops unremarkable.  Normal appendix.  Air in urinary bladder question related to prior instrumentation.  LEFT inguinal hernia containing fat.  No mass, adenopathy, free fluid or inflammatory process.  Umbilical hernia containing fat.  Degenerative disc disease changes at L4-L5.1  IMPRESSION: Destructive lesion at T3 vertebral body.  Postsurgical changes at T1-T3.  No definite primary tumor identified.  Mildly malrotated kidneys.  Umbilical LEFT inguinal hernias.  Tiny nonspecific lingular pulmonary nodule; due to tumor at T3, recommend followup imaging in 6 months.   Electronically Signed   By: Lavonia Dana M.D.   On: 03/23/2014 16:17   Mr [name] UX Contrast  03/22/2014   [facility]  NEUROIMAGING REPORT   STUDY DATE: 03/22/14 PATIENT NAME: Arjan Strohm DOB: 1976/04/09 MRN: [id number]  ORDERING CLINICIAN: Andrey Spearman, MD  CLINICAL HISTORY: 38 year old male with lower extremity weakness and hyperreflexia.  EXAM: MRI brain (with and without)  TECHNIQUE: MRI of the brain with and without contrast was obtained utilizing 5 mm axial slices with T1, T2, T2 flair, T2 star gradient echo and diffusion weighted views.  T1 sagittal, T2 coronal and postcontrast views in the axial and coronal plane were obtained. CONTRAST: 22ml gadavist IMAGING SITE: [place] (1.2 Tesla MRI)   FINDINGS:  No abnormal lesions are seen on diffusion-weighted views to suggest acute ischemia. The cortical sulci, fissures and cisterns are normal in size and appearance. Lateral, third and fourth ventricle are normal in size and appearance. No extra-axial fluid collections are seen. No evidence of mass effect or midline shift.    No  abnormal lesions are seen on post contrast views.  On sagittal views the posterior fossa, pituitary gland and corpus callosum are unremarkable. No evidence of intracranial hemorrhage on gradient-echo views. The orbits and their contents, paranasal sinuses and calvarium are unremarkable.  Intracranial flow voids are present.   IMPRESSION:  Normal MRI brain (with and without).     INTERPRETING PHYSICIAN:  Penni Bombard, MD Certified in Neurology, Neurophysiology and Neuroimaging  [facility] [address], [place], [place] [address] [phone]   Mr Cervical Spine [name] Contrast  03/22/2014   [facility]  NEUROIMAGING REPORT   STUDY DATE: 03/22/14 PATIENT NAME:  Suyash Amory DOB: 1975/07/23 MRN: [id number]  ORDERING CLINICIAN: Andrey Spearman, MD  CLINICAL HISTORY: 38 year old male with lower extremity weakness and hyperreflexia.   EXAM: MRI cervical spine (with and without)  TECHNIQUE: MRI of the cervical spine was obtained utilizing 3 mm sagittal slices from the posterior fossa down to the T3-4 level with T1, T2 and inversion recovery views. In addition 4 mm axial slices from X2-1 down to T1-2 level were included with T2 and gradient echo views. CONTRAST: 28ml gadavist  IMAGING SITE: [place] (1.2 Tesla MRI)   FINDINGS:  On sagittal views the vertebral bodies have normal height and alignment.  Again noted is a T3 vertebral body lesion (with edema and enhancement) as noted on MRI thoracic spine study from same day. The spinal cord is notable for severe cord compression at T3 with spinal cord edema at T2-T3. Remainder of visualized spinal cord is normal in size and appearance. The posterior fossa, pituitary gland and paraspinal soft tissues are unremarkable.    On axial views there is no spinal stenosis or foraminal narrowing. The T3 level is not visualized on this study, but is seen on corresponding thoracic MRI from same day. Limited views of  the soft tissues of the head and neck are unremarkable. No abnormal enhancing lesions (except for T3 level abnormalities).    IMPRESSION:  Abnormal MRI cervical spine (with and without) demonstrating: 1. Again seen is the abnormal T3 vertebral body lesion (with edema and enhancement) with resultant severe cord compression at T3 with spinal cord edema at T2-T3. Suspicious for neoplastic (metastatic) etiology. See MRI thoracic spine for further details.  2. The remaining cervical spine levels are unremarkable.     INTERPRETING PHYSICIAN:  Penni Bombard, MD Certified in Neurology, Neurophysiology and Neuroimaging  [facility] [address] [place], [place] [address] [phone]   Mr Thoracic Spine [name] Contrast  03/22/2014   [facility]  NEUROIMAGING REPORT   STUDY DATE: 03/22/14 PATIENT NAME: Julus Kelley DOB: [birth year] MRN: [id number]  ORDERING CLINICIAN: Andrey Spearman, MD  CLINICAL HISTORY: 38 year old male with lower extremity weakness, numbness, hyperreflexia.  EXAM: MRI thoracic spine (with and without)  TECHNIQUE: MRI of the thoracic spine was obtained utilizing 3 mm sagittal slices from V7-8 down to the L1-2 level with T1, T2 and inversion recovery views. In addition 4 mm axial slices from H8-I5 down to T12-L1 level were included with T1, T2 and gradient echo views.   CONTRAST: 30ml gadavist  IMAGING SITE: [place] (1.2 Tesla MRI)   FINDINGS:  On sagittal views the vertebral bodies have normal height and alignment.    There is abnormal T2 and STIR hyperintense signal with T3 vertebral body, extending in the right pedicle. This hypointense on T1 with avid enhancement. There is extension of abnormal enhancement encircling the spinal cord at this level, resulting in severe cord compression. There is subtle increased T2 hyperintense signal within the spinal cord at this level (especially on sagittal T2 and STIR views) suggesting spinal  cord edema. Findings concerning for a neoplastic (metastatic) process. Infectious process considered but less likely.   Elsewhere the spinal cord is normal in size and appearance. The paraspinal soft tissues are unremarkable.    On axial views again demonstrating at T3 level, the enhancing vertebral body abnormality with adjacent severe spinal cord compression. At other levels, there is no spinal stenosis or foraminal narrowing. Limited views of the aorta, kidneys,  liver, lungs and paraspinal muscles are unremarkable.   IMPRESSION:  Abnormal MRI thoracic spine (with and without) demonstrating: 1. At T3 level, there is an isolated, abnormal vertebral body lesion (with enhancement and edema) with extension along the right pedicle and epidural extension encircling the spinal cord. There is resultant severe spinal cord compression (with mild cord edema). Findings concerning for a neoplastic (likely metastatic) process. Infectious process considered but less likely.  2. Remaining thoracic spine levels unremarkable.     INTERPRETING PHYSICIAN:  Penni Bombard, MD Certified in Neurology, Neurophysiology and Neuroimaging  [facility] [address] [place], [place] [address] [phone]   Ct Abdomen Pelvis W Contrast  03/23/2014   CLINICAL DATA:  One day post back surgery for tumor, workup for cancer of unknown primary  EXAM: CT CHEST, ABDOMEN, AND PELVIS WITH CONTRAST  TECHNIQUE: Multidetector CT imaging of the chest, abdomen and pelvis was performed following the standard protocol during bolus administration of intravenous contrast. Sagittal and coronal MPR images reconstructed from axial data set.  CONTRAST:  186mL OMNIPAQUE IOHEXOL 300 MG/ML SOLN IV. Dilute oral contrast.  COMPARISON:  None  FINDINGS: CT CHEST FINDINGS  Thoracic vascular structures grossly patent on nondedicated exam.  No thoracic adenopathy.  Tiny pulmonary nodule lingula image 32.  Dependent atelectasis in posterior  lungs.  No infiltrate, pleural effusion, pneumothorax or additional mass/nodule.  Destructive lesion within the T3 vertebral body again identified with evidence of interval posterior upper thoracic decompression at T1-T3 and surgical drain at the dorsal aspect of the thecal sac.  CT ABDOMEN AND PELVIS FINDINGS  Malrotated kidneys bilaterally, hila directed slightly anteriorly.  Liver, spleen, pancreas, kidneys, and adrenal glands otherwise unremarkable.  Stomach contains food debris body and is inadequately opacified and distended, no gross mass.  Large and small bowel loops unremarkable.  Normal appendix.  Air in urinary bladder question related to prior instrumentation.  LEFT inguinal hernia containing fat.  No mass, adenopathy, free fluid or inflammatory process.  Umbilical hernia containing fat.  Degenerative disc disease changes at L4-L5.1  IMPRESSION: Destructive lesion at T3 vertebral body.  Postsurgical changes at T1-T3.  No definite primary tumor identified.  Mildly malrotated kidneys.  Umbilical LEFT inguinal hernias.  Tiny nonspecific lingular pulmonary nodule; due to tumor at T3, recommend followup imaging in 6 months.   Electronically Signed   By: Lavonia Dana M.D.   On: 03/23/2014 16:17   Dg Thoracic Spine 1 View  03/23/2014   CLINICAL DATA:  Thoracic 2 through 4 laminectomy for tumor; history of thoracic metastatic disease.  EXAM: OPERATIVE THORACIC SPINE  VIEW(S)  COMPARISON:  None.  FINDINGS: Limited radiograph of the upper thoracic and lower cervical spine. Extensive hardware overlies the upper chest resulting in obscuration, surgical instruments at the level of T3/4. Endotracheal tube tip projects immediately above the carina.  IMPRESSION: Limited intraoperative radiograph, surgical instruments at the level of T3-4.   Electronically Signed   By: Elon Alas   On: 03/23/2014 04:23    Microbiology: Recent Results (from the past 240 hour(s))  SURGICAL PCR SCREEN     Status: None    Collection Time    03/22/14 10:24 PM      Result Value Ref Range Status   MRSA, PCR NEGATIVE  NEGATIVE Final   Staphylococcus aureus NEGATIVE  NEGATIVE Final   Comment:            The Xpert SA Assay (FDA     approved for NASAL  specimens     in patients over 23 years of age),     is one component of     a comprehensive surveillance     program.  Test performance has     been validated by [employer] for patients greater     than or equal to 31 year old.     It is not intended     to diagnose infection nor to     guide or monitor treatment.  URINE CULTURE     Status: None   Collection Time    03/23/14  4:00 PM      Result Value Ref Range Status   Specimen Description URINE, CLEAN CATCH   Final   Special Requests NONE   Final   Culture  Setup Time     Final   Value: 03/23/2014 17:10     Performed at [place]     Final   Value: NO GROWTH     Performed at [facility]   Culture     Final   Value: NO GROWTH     Performed at [facility]   Report Status 03/25/2014 FINAL   Final     Labs: Basic Metabolic Panel:  Recent Labs Lab 03/23/14 0123 03/23/14 0825 03/24/14 0811  NA 139 136* 135*  K 3.8 5.1 4.7  CL  --  99 100  CO2  --  23 22  GLUCOSE 151* 252* 320*  BUN  --  11 20  CREATININE  --  0.92 1.07  CALCIUM  --  8.7 8.9  MG  --  1.8  --    Liver Function Tests:  Recent Labs Lab 03/23/14 0825 03/24/14 0811  AST 23 17  ALT 25 21  ALKPHOS 47 51  BILITOT 0.5 <0.2*  PROT 6.9 6.5  ALBUMIN 3.6 3.3*   No results found for this basename: LIPASE, AMYLASE,  in the last 168 hours No results found for this basename: AMMONIA,  in the last 168 hours CBC:  Recent Labs Lab 03/23/14 0123 03/23/14 0825 03/24/14 0811  WBC  --  12.2* 17.1*  NEUTROABS  --  11.5* 15.7*  HGB 8.8* 13.1 12.0*  HCT 26.0* 37.6* 35.8*  MCV  --  74.9* 76.7*  PLT  --  141* 146*   Cardiac Enzymes: No results found for this basename: CKTOTAL,  CKMB, CKMBINDEX, TROPONINI,  in the last 168 hours BNP: BNP (last 3 results) No results found for this basename: PROBNP,  in the last 8760 hours CBG:  Recent Labs Lab 03/26/14 1132 03/26/14 1647 03/26/14 2126 03/27/14 0742 03/27/14 1106  GLUCAP 325* 295* 366* 189* 190*       Signed:  Saurav Crumble K  [facility] 03/27/2014, 2:22 PM

## 2014-04-02 ENCOUNTER — Encounter: Payer: Self-pay | Admitting: Radiation Oncology

## 2014-04-02 NOTE — Progress Notes (Signed)
Histology and Location of Primary Cancer: unknown  Sites of Visceral and Bony Metastatic Disease: T3 neoplastic lesion; significant thoracic myelopathy secondary to epidural tumor spread and compression  Location(s) of Symptomatic Metastases: T3  Past/Anticipated chemotherapy by medical oncology, if any: NO  Pain on a scale of 0-10 is:    If Spine Met(s), symptoms, if any, include:  Bowel/Bladder retention or incontinence (please describe): NO  Numbness or weakness in extremities (please describe): prior to surgery patient experienced 2 month history of progressive bilateral lower extremity weakness, sensory loss, and incoordination. He presented to [facility] after becoming nonambulatory. Shortly, after laminectomy done 03/22/2014 patient was discharged home with home health and PT.  Current Decadron regimen, if applicable: Discharged 56/81/2751 with Medrol dose pack to cmplete  Ambulatory status? Walker? Wheelchair?: Ambulatory with Assistance  SAFETY ISSUES:  Prior radiation? no  Pacemaker/ICD? no  Possible current pregnancy? no  Is the patient on methotrexate? no  Current Complaints / other details:  38 year old male. Consult with Dr. Tammi Klippel to receive stereotactic radiation to eradicate remaining tumor.

## 2014-04-05 ENCOUNTER — Encounter: Payer: Self-pay | Admitting: Radiation Oncology

## 2014-04-05 ENCOUNTER — Ambulatory Visit
Admission: RE | Admit: 2014-04-05 | Discharge: 2014-04-05 | Disposition: A | Discharge status: Home / Self Care | Payer: 59 | Source: Ambulatory Visit | Attending: Radiation Oncology | Admitting: Radiation Oncology

## 2014-04-05 DIAGNOSIS — D1809 Hemangioma of other sites: Secondary | ICD-10-CM | POA: Insufficient documentation

## 2014-04-05 DIAGNOSIS — C7951 Secondary malignant neoplasm of bone: Secondary | ICD-10-CM

## 2014-04-05 DIAGNOSIS — Z510 Encounter for antineoplastic radiation therapy: Secondary | ICD-10-CM | POA: Diagnosis present

## 2014-04-05 NOTE — Progress Notes (Signed)
Reports numbness and tingling of lower extremities continue. Patient reports he is able to control his legs, bowel, and bladder. Denies pain. Reports taking oxy IR before bed "to relax enough to sleep." Reports PT comes out twice per week to his home. Patient still has staples at surgical site. Encouraged patient to follow up with Dr. Annette Stable for removal of staples. Denies having a pacemaker. Reports he has a steel plate in his leg. Denies headache, dizziness, nausea, vomiting, diarrhea, diplopia or ringing in the ears.

## 2014-04-05 NOTE — Progress Notes (Signed)
See progress note under physician encounter. 

## 2014-04-05 NOTE — Progress Notes (Signed)
Radiation Oncology         [phone]) [phone] ________________________________  Initial outpatient Consultation  Name: Gary Conway MRN: [id number]  Date: 04/05/2014  DOB: May 11, 1976  UX:LKGMWN,UUVOZ VINCENT, MD  Annita Brod, MD   REFERRING PHYSICIAN: Annita Brod, MD  DIAGNOSIS: 38 yo gentleman with spinal hemangioma    ICD-9-CM ICD-10-CM   1. Spinal hemangioma 228.09 D18.09     HISTORY OF PRESENT ILLNESS::Gary Conway is a 38 y.o. male who presented with a 2 month history of progressive bilateral lower extremity weakness, sensory loss, and incoordination.  He head essentially become nonambulatory independently but still could walk a little bit with a walker as an aide. He denied bowel or bladder dysfunction. He had relative sensory loss from his chest distally.  MRI of the thoracic spine 10/26/2015at [facility] revealed abnormal T2 and STIR hyperintense signal with T3 vertebral body, extending in the right pedicle. This washypointense on T1 with avid enhancement. Therewas extension of abnormal enhancement encircling the spinal cord at this level, resulting in severe cord compression. There was subtle increased T2 hyperintense signal within the spinal cord at this level (especially on sagittal T2 and STIR views) suggesting spinal cord edema. Findings were concerning for a neoplastic (metastatic) process.consequently, the patient proceeded to thoracic laminectomy with tumor microdissection. Postoperatively, the patient's strength has improved. He still ambulatory walker. He is recovering uneventfully from surgery and presents today to discuss potential further treatment options.  PREVIOUS RADIATION THERAPY: No  PAST MEDICAL HISTORY:  has a past medical history of Diabetes mellitus without complication and Cancer.    PAST SURGICAL HISTORY: Past Surgical History  Procedure Laterality Date  . Leg surgery    . Laminectomy N/A 03/22/2014    Procedure:  THORACIC two - four  LAMINECTOMY FOR TUMOR;  Surgeon: Charlie Pitter, MD;  Location: [facility];  Service: Neurosurgery;  Laterality: N/A;  Throacic two  - four laminectomy for tumor    FAMILY HISTORY: family history includes Breast cancer in his mother.  SOCIAL HISTORY:  reports that he has never smoked. He has never used smokeless tobacco. He reports that he drinks alcohol. He reports that he does not use illicit drugs.  ALLERGIES: Review of patient's allergies indicates no known allergies.  MEDICATIONS:  Current Outpatient Prescriptions  Medication Sig Dispense Refill  . metFORMIN (GLUCOPHAGE) 500 MG tablet Take 1 tablet (500 mg total) by mouth 2 (two) times daily with a meal. 60 tablet 1  . oxyCODONE (OXY IR/ROXICODONE) 5 MG immediate release tablet Take 1 tablet (5 mg total) by mouth every 4 (four) hours as needed for severe pain. 30 tablet 0  . cyclobenzaprine (FLEXERIL) 5 MG tablet Take 5 mg by mouth 3 (three) times daily as needed for muscle spasms.    Marland Kitchen HYDROcodone-acetaminophen (NORCO) 10-325 MG per tablet     . methylPREDNIsolone (MEDROL DOSPACK) 4 MG tablet follow package directions 21 tablet 0  . naproxen (NAPROSYN) 500 MG tablet Take 1 tablet (500 mg total) by mouth 2 (two) times daily with a meal. 30 tablet 0   No current facility-administered medications for this encounter.    REVIEW OF SYSTEMS:  A 15 point review of systems is documented in the electronic medical record. This was obtained by the nursing staff. However, I reviewed this with the patient to discuss relevant findings and make appropriate changes.  Reports numbness and tingling of lower extremities continue. Patient reports he is able to control his legs, bowel, and bladder. Denies  pain. Reports taking oxy IR before bed "to relax enough to sleep." Reports PT comes out twice per week to his home. Patient still has staples at surgical site. Encouraged patient to follow up with Dr. Annette Stable for removal of staples. Denies  having a pacemaker. Reports he has a steel plate in his leg. Denies headache, dizziness, nausea, vomiting, diarrhea, diplopia or ringing in the ears.    PHYSICAL EXAM:  height is 5\' 8"  (1.727 m) and weight is 210 lb (95.255 kg). His blood pressure is 127/69 and his pulse is 116. His respiration is 18 and oxygen saturation is 100%.    per Dr. Grandville Silos General: Well-developed well-nourished in no acute cardiopulmonary distress. Speaking in full sentences.  Eyes: PERRLA, EOMI, normal lids, irises & conjunctiva  ENT: grossly normal hearing, lips & tongue  Neck: no LAD, masses or thyromegaly. Patient with a drain noted in the posterior neck region.  Cardiovascular: RRR, no m/r/g. No LE edema.  Respiratory: CTA bilaterally, no w/r/r. Normal respiratory effort.  Abdomen: soft, ntnd, positive bowel sounds, no rebound, no guarding  Skin: no rash or induration seen on limited exam  Musculoskeletal: 5 out of 5 bilateral upper extremity strength. 4-5/5 bilateral lower extremity strength.  Psychiatric: grossly normal mood and affect, speech fluent and appropriate Neurologic: Alert and oriented 3. Cranial nerves II through XII are grossly intact. Sensation is intact. Visual fields are intact. Unable to elicit reflexes symmetrically and diffusely. Finger-to-nose is intact. Gait not tested secondary to safety.   KPS = 80  100 - Normal; no complaints; no evidence of disease. 90   - Able to carry on normal activity; minor signs or symptoms of disease. 80   - Normal activity with effort; some signs or symptoms of disease. 20   - Cares for self; unable to carry on normal activity or to do active work. 60   - Requires occasional assistance, but is able to care for most of his personal needs. 50   - Requires considerable assistance and frequent medical care. 7   - Disabled; requires special care and assistance. 56   - Severely disabled; hospital admission is indicated although death not imminent. 25    - Very sick; hospital admission necessary; active supportive treatment necessary. 10   - Moribund; fatal processes progressing rapidly. 0     - Dead  Karnofsky DA, Abelmann Juana Diaz, Craver LS and Burchenal [facility] [phone]) The use of the nitrogen mustards in the palliative treatment of carcinoma: with particular reference to bronchogenic carcinoma Cancer 1 [phone]  LABORATORY DATA:  Lab Results  Component Value Date   WBC 17.1* 03/24/2014   HGB 12.0* 03/24/2014   HCT 35.8* 03/24/2014   MCV 76.7* 03/24/2014   PLT 146* 03/24/2014   Lab Results  Component Value Date   NA 135* 03/24/2014   K 4.7 03/24/2014   CL 100 03/24/2014   CO2 22 03/24/2014   Lab Results  Component Value Date   ALT 21 03/24/2014   AST 17 03/24/2014   ALKPHOS 51 03/24/2014   BILITOT <0.2* 03/24/2014     RADIOGRAPHY: Ct Chest W Contrast  03/23/2014   CLINICAL DATA:  One day post back surgery for tumor, workup for cancer of unknown primary  EXAM: CT CHEST, ABDOMEN, AND PELVIS WITH CONTRAST  TECHNIQUE: Multidetector CT imaging of the chest, abdomen and pelvis was performed following the standard protocol during bolus administration of intravenous contrast. Sagittal and coronal MPR images reconstructed from axial data set.  CONTRAST:  122mL OMNIPAQUE IOHEXOL 300 MG/ML SOLN IV. Dilute oral contrast.  COMPARISON:  None  FINDINGS: CT CHEST FINDINGS  Thoracic vascular structures grossly patent on nondedicated exam.  No thoracic adenopathy.  Tiny pulmonary nodule lingula image 32.  Dependent atelectasis in posterior lungs.  No infiltrate, pleural effusion, pneumothorax or additional mass/nodule.  Destructive lesion within the T3 vertebral body again identified with evidence of interval posterior upper thoracic decompression at T1-T3 and surgical drain at the dorsal aspect of the thecal sac.  CT ABDOMEN AND PELVIS FINDINGS  Malrotated kidneys bilaterally, hila directed slightly anteriorly.  Liver, spleen, pancreas, kidneys, and adrenal  glands otherwise unremarkable.  Stomach contains food debris body and is inadequately opacified and distended, no gross mass.  Large and small bowel loops unremarkable.  Normal appendix.  Air in urinary bladder question related to prior instrumentation.  LEFT inguinal hernia containing fat.  No mass, adenopathy, free fluid or inflammatory process.  Umbilical hernia containing fat.  Degenerative disc disease changes at L4-L5.1  IMPRESSION: Destructive lesion at T3 vertebral body.  Postsurgical changes at T1-T3.  No definite primary tumor identified.  Mildly malrotated kidneys.  Umbilical LEFT inguinal hernias.  Tiny nonspecific lingular pulmonary nodule; due to tumor at T3, recommend followup imaging in 6 months.   Electronically Signed   By: Lavonia Dana M.D.   On: 03/23/2014 16:17   Mr [name] GL Contrast  03/22/2014   [facility]  NEUROIMAGING REPORT   STUDY DATE: 03/22/14 PATIENT NAME: Burdett Pinzon DOB: [birth year] MRN: [id number]  ORDERING CLINICIAN: Andrey Spearman, MD  CLINICAL HISTORY: 38 year old male with lower extremity weakness and hyperreflexia.  EXAM: MRI brain (with and without)  TECHNIQUE: MRI of the brain with and without contrast was obtained utilizing 5 mm axial slices with T1, T2, T2 flair, T2 star gradient echo and diffusion weighted views.  T1 sagittal, T2 coronal and postcontrast views in the axial and coronal plane were obtained. CONTRAST: 81ml gadavist IMAGING SITE: [place] (1.2 Tesla MRI)   FINDINGS:  No abnormal lesions are seen on diffusion-weighted views to suggest acute ischemia. The cortical sulci, fissures and cisterns are normal in size and appearance. Lateral, third and fourth ventricle are normal in size and appearance. No extra-axial fluid collections are seen. No evidence of mass effect or midline shift.    No abnormal lesions are seen on post contrast views.  On sagittal views the posterior fossa, pituitary gland and corpus callosum are  unremarkable. No evidence of intracranial hemorrhage on gradient-echo views. The orbits and their contents, paranasal sinuses and calvarium are unremarkable.  Intracranial flow voids are present.   IMPRESSION:  Normal MRI brain (with and without).     INTERPRETING PHYSICIAN:  Penni Bombard, MD Certified in Neurology, Neurophysiology and Neuroimaging  [facility] [address] [place], [place] [address] [phone]   Mr Cervical Spine [name] Contrast  03/22/2014   [facility]  NEUROIMAGING REPORT   STUDY DATE: 03/22/14 PATIENT NAME: Candy Ziegler DOB: July 20, 1975 MRN: [id number]  ORDERING CLINICIAN: Andrey Spearman, MD  CLINICAL HISTORY: 38 year old male with lower extremity weakness and hyperreflexia.   EXAM: MRI cervical spine (with and without)  TECHNIQUE: MRI of the cervical spine was obtained utilizing 3 mm sagittal slices from the posterior fossa down to the T3-4 level with T1, T2 and inversion recovery views. In addition 4 mm axial slices from D2-2 down to T1-2 level were included with T2 and gradient  echo views. CONTRAST: 13ml gadavist  IMAGING SITE: [place] (1.2 Tesla MRI)   FINDINGS:  On sagittal views the vertebral bodies have normal height and alignment.  Again noted is a T3 vertebral body lesion (with edema and enhancement) as noted on MRI thoracic spine study from same day. The spinal cord is notable for severe cord compression at T3 with spinal cord edema at T2-T3. Remainder of visualized spinal cord is normal in size and appearance. The posterior fossa, pituitary gland and paraspinal soft tissues are unremarkable.    On axial views there is no spinal stenosis or foraminal narrowing. The T3 level is not visualized on this study, but is seen on corresponding thoracic MRI from same day. Limited views of the soft tissues of the head and neck are unremarkable. No abnormal enhancing lesions (except for T3 level abnormalities).     IMPRESSION:  Abnormal MRI cervical spine (with and without) demonstrating: 1. Again seen is the abnormal T3 vertebral body lesion (with edema and enhancement) with resultant severe cord compression at T3 with spinal cord edema at T2-T3. Suspicious for neoplastic (metastatic) etiology. See MRI thoracic spine for further details.  2. The remaining cervical spine levels are unremarkable.     INTERPRETING PHYSICIAN:  Penni Bombard, MD Certified in Neurology, Neurophysiology and Neuroimaging  [facility] [address], [place], [place] [address] [phone]   Mr Thoracic Spine [name] Contrast  03/22/2014   [facility]  NEUROIMAGING REPORT   STUDY DATE: 03/22/14 PATIENT NAME: Gevon Markus DOB: September 05, 1975 MRN: [id number]  ORDERING CLINICIAN: Andrey Spearman, MD  CLINICAL HISTORY: 37 year old male with lower extremity weakness, numbness, hyperreflexia.  EXAM: MRI thoracic spine (with and without)  TECHNIQUE: MRI of the thoracic spine was obtained utilizing 3 mm sagittal slices from U9-3 down to the L1-2 level with T1, T2 and inversion recovery views. In addition 4 mm axial slices from A3-F5 down to T12-L1 level were included with T1, T2 and gradient echo views.   CONTRAST: 84ml gadavist  IMAGING SITE: [place] (1.2 Tesla MRI)   FINDINGS:  On sagittal views the vertebral bodies have normal height and alignment.    There is abnormal T2 and STIR hyperintense signal with T3 vertebral body, extending in the right pedicle. This hypointense on T1 with avid enhancement. There is extension of abnormal enhancement encircling the spinal cord at this level, resulting in severe cord compression. There is subtle increased T2 hyperintense signal within the spinal cord at this level (especially on sagittal T2 and STIR views) suggesting spinal cord edema. Findings concerning for a neoplastic (metastatic) process. Infectious process considered but less likely.    Elsewhere the spinal cord is normal in size and appearance. The paraspinal soft tissues are unremarkable.    On axial views again demonstrating at T3 level, the enhancing vertebral body abnormality with adjacent severe spinal cord compression. At other levels, there is no spinal stenosis or foraminal narrowing. Limited views of the aorta, kidneys, liver, lungs and paraspinal muscles are unremarkable.   IMPRESSION:  Abnormal MRI thoracic spine (with and without) demonstrating: 1. At T3 level, there is an isolated, abnormal vertebral body lesion (with enhancement and edema) with extension along the right pedicle and epidural extension encircling the spinal cord. There is resultant severe spinal cord compression (with mild cord edema). Findings concerning for a neoplastic (likely metastatic) process. Infectious process considered but less likely.  2. Remaining thoracic spine levels unremarkable.  INTERPRETING PHYSICIAN:  Penni Bombard, MD Certified in Neurology, Neurophysiology and Neuroimaging  [facility] [address], [place], [place] [address] [phone]   Ct Abdomen Pelvis W Contrast  03/23/2014   CLINICAL DATA:  One day post back surgery for tumor, workup for cancer of unknown primary  EXAM: CT CHEST, ABDOMEN, AND PELVIS WITH CONTRAST  TECHNIQUE: Multidetector CT imaging of the chest, abdomen and pelvis was performed following the standard protocol during bolus administration of intravenous contrast. Sagittal and coronal MPR images reconstructed from axial data set.  CONTRAST:  122mL OMNIPAQUE IOHEXOL 300 MG/ML SOLN IV. Dilute oral contrast.  COMPARISON:  None  FINDINGS: CT CHEST FINDINGS  Thoracic vascular structures grossly patent on nondedicated exam.  No thoracic adenopathy.  Tiny pulmonary nodule lingula image 32.  Dependent atelectasis in posterior lungs.  No infiltrate, pleural effusion, pneumothorax or additional mass/nodule.  Destructive lesion within the T3  vertebral body again identified with evidence of interval posterior upper thoracic decompression at T1-T3 and surgical drain at the dorsal aspect of the thecal sac.  CT ABDOMEN AND PELVIS FINDINGS  Malrotated kidneys bilaterally, hila directed slightly anteriorly.  Liver, spleen, pancreas, kidneys, and adrenal glands otherwise unremarkable.  Stomach contains food debris body and is inadequately opacified and distended, no gross mass.  Large and small bowel loops unremarkable.  Normal appendix.  Air in urinary bladder question related to prior instrumentation.  LEFT inguinal hernia containing fat.  No mass, adenopathy, free fluid or inflammatory process.  Umbilical hernia containing fat.  Degenerative disc disease changes at L4-L5.1  IMPRESSION: Destructive lesion at T3 vertebral body.  Postsurgical changes at T1-T3.  No definite primary tumor identified.  Mildly malrotated kidneys.  Umbilical LEFT inguinal hernias.  Tiny nonspecific lingular pulmonary nodule; due to tumor at T3, recommend followup imaging in 6 months.   Electronically Signed   By: Lavonia Dana M.D.   On: 03/23/2014 16:17   Dg Thoracic Spine 1 View  03/23/2014   CLINICAL DATA:  Thoracic 2 through 4 laminectomy for tumor; history of thoracic metastatic disease.  EXAM: OPERATIVE THORACIC SPINE  VIEW(S)  COMPARISON:  None.  FINDINGS: Limited radiograph of the upper thoracic and lower cervical spine. Extensive hardware overlies the upper chest resulting in obscuration, surgical instruments at the level of T3/4. Endotracheal tube tip projects immediately above the carina.  IMPRESSION: Limited intraoperative radiograph, surgical instruments at the level of T3-4.   Electronically Signed   By: Elon Alas   On: 03/23/2014 04:23      IMPRESSION: This patient is a very nice 38 year old gentleman status post laminectomy and microdissection for cord compression caused by hemangioma at the T3 vertebral level.  Symptomatic hemangioma involving the  spine is a relatively rare entity. There are a variety of published treatment approaches including surgical decompression followed by radiotherapy.  PLAN:Today, I talked to the patient and family about the findings and work-up thus far.  We discussed the natural history of spinal hemangioma and general treatment, highlighting the role of radiotherapy in the management.  We discussed the available radiation techniques, and focused on the details of logistics and delivery.  We reviewed the anticipated acute and late sequelae associated with radiation in this setting.  The patient was encouraged to ask questions that I answered to the best of my ability.   I will plan to discuss this case further with neurosurgery.  I have tentatively scheduled him for CT simulation.  I spent 60 minutes minutes face  to face with the patient and more than 50% of that time was spent in counseling and/or coordination of care.    ------------------------------------------------  Sheral Apley. Tammi Klippel, M.D.

## 2014-04-08 ENCOUNTER — Other Ambulatory Visit: Payer: Self-pay | Admitting: Radiation Oncology

## 2014-04-08 ENCOUNTER — Ambulatory Visit
Admission: RE | Admit: 2014-04-08 | Discharge: 2014-04-08 | Disposition: A | Discharge status: Home / Self Care | Payer: 59 | Source: Ambulatory Visit | Attending: Radiation Oncology | Admitting: Radiation Oncology

## 2014-04-08 DIAGNOSIS — C801 Malignant (primary) neoplasm, unspecified: Principal | ICD-10-CM

## 2014-04-08 DIAGNOSIS — C7949 Secondary malignant neoplasm of other parts of nervous system: Secondary | ICD-10-CM

## 2014-04-08 DIAGNOSIS — Z510 Encounter for antineoplastic radiation therapy: Secondary | ICD-10-CM | POA: Diagnosis not present

## 2014-04-08 DIAGNOSIS — D1809 Hemangioma of other sites: Secondary | ICD-10-CM

## 2014-04-08 NOTE — Progress Notes (Signed)
  Radiation Oncology         [phone] ________________________________  Name: Gary Conway MRN: [id number]  Date: 04/08/2014  DOB: 10/03/1975  SIMULATION AND TREATMENT PLANNING NOTE    ICD-9-CM ICD-10-CM   1. Spinal hemangioma 228.09 D18.09     DIAGNOSIS:  38 yo gentleman with spinal hemangioma  NARRATIVE:  The patient was brought to the [facility].  Identity was confirmed.  All relevant records and images related to the planned course of therapy were reviewed.  The patient freely provided informed written consent to proceed with treatment after reviewing the details related to the planned course of therapy. The consent form was witnessed and verified by the simulation staff.  Then, the patient was set-up in a stable reproducible  supine position for radiation therapy.  CT images were obtained.  Surface markings were placed.  The CT images were loaded into the planning software.  Then the target and avoidance structures were contoured.  Treatment planning then occurred.  The radiation prescription was entered and confirmed.  Then, I designed and supervised the construction of a total of 2 medically necessary complex treatment devices in the form of multileaf collimators covering the hemangioma site shielding the lungs and heart.  I have requested : Isodose Plan.  I have ordered:Nutrition Consult  PLAN:  The patient will receive 40 Gy in 20 fractions.  ________________________________  Sheral Apley Tammi Klippel, M.D.      This study supports a dose of 40 Gy: Rades D, Bajrovic A, Alberti W, Rudat V. Is there a dose-effect relationship for the treatment of symptomatic vertebral hemangioma? Int Zada Finders Oncol Biol Phys. 2003 Jan 1;55(1):178-81. PubMed PMID: [id number]

## 2014-04-14 DIAGNOSIS — Z510 Encounter for antineoplastic radiation therapy: Secondary | ICD-10-CM | POA: Diagnosis not present

## 2014-04-15 ENCOUNTER — Ambulatory Visit: Payer: 59 | Admitting: Radiation Oncology

## 2014-04-15 ENCOUNTER — Ambulatory Visit
Admission: RE | Admit: 2014-04-15 | Discharge: 2014-04-15 | Disposition: A | Discharge status: Home / Self Care | Payer: 59 | Source: Ambulatory Visit | Attending: Radiation Oncology | Admitting: Radiation Oncology

## 2014-04-15 DIAGNOSIS — D1809 Hemangioma of other sites: Secondary | ICD-10-CM

## 2014-04-15 DIAGNOSIS — Z510 Encounter for antineoplastic radiation therapy: Secondary | ICD-10-CM | POA: Diagnosis not present

## 2014-04-15 NOTE — Progress Notes (Signed)
  Radiation Oncology         [phone] ________________________________  Name: Gary Conway MRN: [id number]  Date: 04/15/2014  DOB: 24-Apr-1976  Simulation Verification Note    ICD-9-CM ICD-10-CM   1. Spinal hemangioma 228.09 D18.09    Status: outpatient  NARRATIVE: The patient was brought to the treatment unit and placed in the planned treatment position. The clinical setup was verified. Then port films were obtained and uploaded to the radiation oncology medical record software.  The treatment beams were carefully compared against the planned radiation fields. The position location and shape of the radiation fields was reviewed. They targeted volume of tissue appears to be appropriately covered by the radiation beams. Organs at risk appear to be excluded as planned.  Based on my personal review, I approved the simulation verification. The patient's treatment will proceed as planned.  ------------------------------------------------  Sheral Apley Tammi Klippel, M.D.

## 2014-04-16 ENCOUNTER — Ambulatory Visit
Admission: RE | Admit: 2014-04-16 | Discharge: 2014-04-16 | Disposition: A | Discharge status: Home / Self Care | Payer: 59 | Source: Ambulatory Visit | Attending: Radiation Oncology | Admitting: Radiation Oncology

## 2014-04-16 ENCOUNTER — Encounter: Payer: Self-pay | Admitting: Radiation Oncology

## 2014-04-16 ENCOUNTER — Ambulatory Visit: Payer: 59

## 2014-04-16 VITALS — BP 123/83 | HR 115 | Resp 16 | Wt 201.9 lb

## 2014-04-16 DIAGNOSIS — Z510 Encounter for antineoplastic radiation therapy: Secondary | ICD-10-CM | POA: Diagnosis not present

## 2014-04-16 DIAGNOSIS — D1809 Hemangioma of other sites: Secondary | ICD-10-CM

## 2014-04-16 NOTE — Progress Notes (Signed)
  Radiation Oncology         [phone] ________________________________  Name: Gary Conway MRN: [id number]  Date: 04/16/2014  DOB: 08-02-1975  Weekly Radiation Therapy Management    ICD-9-CM ICD-10-CM   1. Spinal hemangioma 228.09 D18.09     Current Dose: 4 Gy     Planned Dose:  40 Gy  Narrative . . . . . . . . The patient presents for routine under treatment assessment.                                   Denies pain at this time. Reports he hasn't needed pain medication in three weeks. Ambulating today with the aid of a walker. Reports right leg is improving faster than his left. Partakes in PT twice per week. Numbness and tingling in both legs continue. Reports swelling in right upper leg. Reports he has normal control of bowel and bladder. Heart rate slightly elevated. Weight stable.                                  Set-up films were reviewed.                                 The chart was checked. Physical Findings. . .  weight is 201 lb 14.4 oz (91.581 kg). His blood pressure is 123/83 and his pulse is 115. His respiration is 16. . Weight essentially stable.  No significant changes. Impression . . . . . . . The patient is tolerating radiation. Plan . . . . . . . . . . . . Continue treatment as planned.  ________________________________  Sheral Apley. Tammi Klippel, M.D.

## 2014-04-16 NOTE — Progress Notes (Signed)
Denies pain at this time. Reports he hasn't needed pain medication in three weeks. Ambulating today with the aid of a walker. Reports right leg is improving faster than his left. Partakes in PT twice per week. Numbness and tingling in both legs continue. Reports swelling in right upper leg. Reports he has normal control of bowel and bladder. Heart rate slightly elevated. Weight stable.

## 2014-04-18 ENCOUNTER — Ambulatory Visit
Admission: RE | Admit: 2014-04-18 | Discharge: 2014-04-18 | Disposition: A | Discharge status: Home / Self Care | Payer: 59 | Source: Ambulatory Visit | Attending: Radiation Oncology | Admitting: Radiation Oncology

## 2014-04-18 DIAGNOSIS — Z510 Encounter for antineoplastic radiation therapy: Secondary | ICD-10-CM | POA: Diagnosis not present

## 2014-04-19 ENCOUNTER — Ambulatory Visit
Admission: RE | Admit: 2014-04-19 | Discharge: 2014-04-19 | Disposition: A | Discharge status: Home / Self Care | Payer: 59 | Source: Ambulatory Visit | Attending: Radiation Oncology | Admitting: Radiation Oncology

## 2014-04-19 DIAGNOSIS — Z510 Encounter for antineoplastic radiation therapy: Secondary | ICD-10-CM | POA: Diagnosis not present

## 2014-04-20 ENCOUNTER — Ambulatory Visit
Admission: RE | Admit: 2014-04-20 | Discharge: 2014-04-20 | Disposition: A | Discharge status: Home / Self Care | Payer: 59 | Source: Ambulatory Visit | Attending: Radiation Oncology | Admitting: Radiation Oncology

## 2014-04-20 DIAGNOSIS — Z510 Encounter for antineoplastic radiation therapy: Secondary | ICD-10-CM | POA: Diagnosis not present

## 2014-04-20 NOTE — Progress Notes (Signed)
  Radiation Oncology         [phone] ________________________________  Name: Gary Conway MRN: [id number]  Date: 04/21/2014  DOB: 1976/04/02  Weekly Radiation Therapy Management    ICD-9-CM ICD-10-CM   1. Spinal hemangioma 228.09 D18.09     Current Dose: 12 Gy     Planned Dose:  40 Gy  Narrative . . . . . . . . The patient presents for routine under treatment assessment.                                   Denies pain at this time. Reports he hasn't needed pain medication in four weeks. Ambulating today with the aid of a walker. Reports right leg is improving faster than his left. Partakes in PT twice per week. Numbness and tingling in both legs continue. Reports swelling in right upper leg. Reports he has normal control of bowel and bladder. Heart rate slightly elevated. Weight stable                                 Set-up films were reviewed.                                 The chart was checked. Physical Findings. . .  weight is 205 lb (92.987 kg). His blood pressure is 136/87 and his pulse is 110. His respiration is 16. . Weight essentially stable.  No significant changes. Impression . . . . . . . The patient is tolerating radiation. Plan . . . . . . . . . . . . Continue treatment as planned.  ________________________________  Sheral Apley. Tammi Klippel, M.D.

## 2014-04-21 ENCOUNTER — Ambulatory Visit
Admission: RE | Admit: 2014-04-21 | Discharge: 2014-04-21 | Disposition: A | Discharge status: Home / Self Care | Payer: 59 | Source: Ambulatory Visit | Attending: Radiation Oncology | Admitting: Radiation Oncology

## 2014-04-21 ENCOUNTER — Encounter: Payer: Self-pay | Admitting: Radiation Oncology

## 2014-04-21 VITALS — BP 136/87 | HR 110 | Resp 16 | Wt 205.0 lb

## 2014-04-21 DIAGNOSIS — Z510 Encounter for antineoplastic radiation therapy: Secondary | ICD-10-CM | POA: Diagnosis not present

## 2014-04-21 DIAGNOSIS — D1809 Hemangioma of other sites: Secondary | ICD-10-CM

## 2014-04-21 NOTE — Progress Notes (Signed)
Denies pain at this time. Reports he hasn't needed pain medication in four weeks. Ambulating today with the aid of a walker. Reports right leg is improving faster than his left. Partakes in PT twice per week. Numbness and tingling in both legs continue. Reports swelling in right upper leg. Reports he has normal control of bowel and bladder. Heart rate slightly elevated. Weight stable.

## 2014-04-23 ENCOUNTER — Ambulatory Visit: Payer: 59

## 2014-04-26 ENCOUNTER — Ambulatory Visit
Admission: RE | Admit: 2014-04-26 | Discharge: 2014-04-26 | Disposition: A | Discharge status: Home / Self Care | Payer: 59 | Source: Ambulatory Visit | Attending: Radiation Oncology | Admitting: Radiation Oncology

## 2014-04-26 DIAGNOSIS — Z510 Encounter for antineoplastic radiation therapy: Secondary | ICD-10-CM | POA: Diagnosis not present

## 2014-04-27 ENCOUNTER — Ambulatory Visit
Admission: RE | Admit: 2014-04-27 | Discharge: 2014-04-27 | Disposition: A | Discharge status: Home / Self Care | Payer: 59 | Source: Ambulatory Visit | Attending: Radiation Oncology | Admitting: Radiation Oncology

## 2014-04-27 DIAGNOSIS — Z510 Encounter for antineoplastic radiation therapy: Secondary | ICD-10-CM | POA: Diagnosis not present

## 2014-04-28 ENCOUNTER — Ambulatory Visit
Admission: RE | Admit: 2014-04-28 | Discharge: 2014-04-28 | Disposition: A | Discharge status: Home / Self Care | Payer: 59 | Source: Ambulatory Visit | Attending: Radiation Oncology | Admitting: Radiation Oncology

## 2014-04-28 DIAGNOSIS — Z510 Encounter for antineoplastic radiation therapy: Secondary | ICD-10-CM | POA: Diagnosis not present

## 2014-04-29 ENCOUNTER — Ambulatory Visit
Admission: RE | Admit: 2014-04-29 | Discharge: 2014-04-29 | Disposition: A | Discharge status: Home / Self Care | Payer: 59 | Source: Ambulatory Visit | Attending: Radiation Oncology | Admitting: Radiation Oncology

## 2014-04-29 DIAGNOSIS — Z510 Encounter for antineoplastic radiation therapy: Secondary | ICD-10-CM | POA: Diagnosis not present

## 2014-04-30 ENCOUNTER — Ambulatory Visit
Admission: RE | Admit: 2014-04-30 | Discharge: 2014-04-30 | Disposition: A | Discharge status: Home / Self Care | Payer: 59 | Source: Ambulatory Visit | Attending: Radiation Oncology | Admitting: Radiation Oncology

## 2014-04-30 ENCOUNTER — Encounter: Payer: Self-pay | Admitting: Radiation Oncology

## 2014-04-30 VITALS — BP 127/77 | HR 117 | Temp 98.4°F | Resp 20 | Ht 68.0 in | Wt 202.3 lb

## 2014-04-30 DIAGNOSIS — D1809 Hemangioma of other sites: Secondary | ICD-10-CM

## 2014-04-30 DIAGNOSIS — Z510 Encounter for antineoplastic radiation therapy: Secondary | ICD-10-CM | POA: Diagnosis not present

## 2014-04-30 NOTE — Progress Notes (Signed)
  Radiation Oncology         [phone] ________________________________  Name: Cory Kitt MRN: [id number]  Date: 04/30/2014  DOB: 1976/02/19  Weekly Radiation Therapy Management    ICD-9-CM ICD-10-CM   1. Spinal hemangioma 228.09 D18.09     Current Dose: 12 Gy     Planned Dose:  40 Gy  Narrative . . . . . . . . The patient presents for routine under treatment assessment.                                   Mr. Cada has completed 11 fractions to his t2-t6 spine.  He denies pain but does have cramping in his upper legs.  He is taking flexeril 1-2 times a day and says he is almost out.  He is using a walker today.  He reports the strength is better in his legs with his right stronger than left.  He reports his upper legs are swollen and his feet swell on and off.  He denies numbness/weakness in his arms.  He denies trouble urinating or with bowel movements.  He denies nausea/trouble swallowing and has a good appetite.  He was discharged from physical therapy Monday. He is not taking decadron.                                 Set-up films were reviewed.                                 The chart was checked. Physical Findings. . .  height is 5\' 8"  (1.727 m) and weight is 202 lb 4.8 oz (91.763 kg). His oral temperature is 98.4 F (36.9 C). His blood pressure is 127/77 and his pulse is 117. His respiration is 20 and oxygen saturation is 99%. . Weight essentially stable.  No significant changes. Impression . . . . . . . The patient is tolerating radiation. Plan . . . . . . . . . . . . Continue treatment as planned.  ________________________________  Sheral Apley. Tammi Klippel, M.D.

## 2014-04-30 NOTE — Progress Notes (Signed)
Gary Conway has completed 11 fractions to his t2-t6 spine.  He denies pain but does have cramping in his upper legs.  He is taking flexeril 1-2 times a day and says he is almost out.  He is using a walker today.  He reports the strength is better in his legs with his right stronger than left.  He reports his upper legs are swollen and his feet swell on and off.  He denies numbness/weakness in his arms.  He denies trouble urinating or with bowel movements.  He denies nausea/trouble swallowing and has a good appetite.  He was discharged from physical therapy Monday. He is not taking decadron.

## 2014-05-03 ENCOUNTER — Ambulatory Visit
Admission: RE | Admit: 2014-05-03 | Discharge: 2014-05-03 | Disposition: A | Discharge status: Home / Self Care | Payer: 59 | Source: Ambulatory Visit | Attending: Radiation Oncology | Admitting: Radiation Oncology

## 2014-05-03 DIAGNOSIS — Z510 Encounter for antineoplastic radiation therapy: Secondary | ICD-10-CM | POA: Diagnosis not present

## 2014-05-04 ENCOUNTER — Ambulatory Visit: Payer: 59

## 2014-05-04 ENCOUNTER — Ambulatory Visit
Admission: RE | Admit: 2014-05-04 | Discharge: 2014-05-04 | Disposition: A | Discharge status: Home / Self Care | Payer: 59 | Source: Ambulatory Visit | Attending: Radiation Oncology | Admitting: Radiation Oncology

## 2014-05-04 DIAGNOSIS — Z510 Encounter for antineoplastic radiation therapy: Secondary | ICD-10-CM | POA: Diagnosis not present

## 2014-05-05 ENCOUNTER — Ambulatory Visit: Payer: 59

## 2014-05-05 ENCOUNTER — Ambulatory Visit
Admission: RE | Admit: 2014-05-05 | Discharge: 2014-05-05 | Disposition: A | Discharge status: Home / Self Care | Payer: 59 | Source: Ambulatory Visit | Attending: Radiation Oncology | Admitting: Radiation Oncology

## 2014-05-05 DIAGNOSIS — Z510 Encounter for antineoplastic radiation therapy: Secondary | ICD-10-CM | POA: Diagnosis not present

## 2014-05-06 ENCOUNTER — Ambulatory Visit: Payer: 59

## 2014-05-06 ENCOUNTER — Ambulatory Visit
Admission: RE | Admit: 2014-05-06 | Discharge: 2014-05-06 | Disposition: A | Discharge status: Home / Self Care | Payer: 59 | Source: Ambulatory Visit | Attending: Radiation Oncology | Admitting: Radiation Oncology

## 2014-05-06 DIAGNOSIS — Z510 Encounter for antineoplastic radiation therapy: Secondary | ICD-10-CM | POA: Diagnosis not present

## 2014-05-07 ENCOUNTER — Encounter: Payer: Self-pay | Admitting: Radiation Oncology

## 2014-05-07 ENCOUNTER — Ambulatory Visit
Admission: RE | Admit: 2014-05-07 | Discharge: 2014-05-07 | Disposition: A | Discharge status: Home / Self Care | Payer: 59 | Source: Ambulatory Visit | Attending: Radiation Oncology | Admitting: Radiation Oncology

## 2014-05-07 VITALS — BP 128/87 | HR 105 | Resp 16 | Wt 203.5 lb

## 2014-05-07 DIAGNOSIS — Z510 Encounter for antineoplastic radiation therapy: Secondary | ICD-10-CM | POA: Diagnosis not present

## 2014-05-07 DIAGNOSIS — K209 Esophagitis, unspecified without bleeding: Secondary | ICD-10-CM

## 2014-05-07 DIAGNOSIS — D1809 Hemangioma of other sites: Secondary | ICD-10-CM

## 2014-05-07 MED ORDER — SUCRALFATE 1 G PO TABS
1.0000 g | ORAL_TABLET | Freq: Three times a day (TID) | ORAL | Status: DC
Start: 2014-05-07 — End: 2019-09-14

## 2014-05-07 NOTE — Progress Notes (Signed)
  Radiation Oncology         [phone] ________________________________  Name: Gary Conway MRN: [id number]  Date: 05/07/2014  DOB: [birth year]  Weekly Radiation Therapy Management    ICD-9-CM ICD-10-CM   1. Acute esophagitis 530.12 K20.9 sucralfate (CARAFATE) 1 G tablet  2. Spinal hemangioma 228.09 D18.09     Current Dose: 32 Gy     Planned Dose:  40 Gy  Narrative . . . . . . . . The patient presents for routine under treatment assessment.                                   Weight and vitals stable. Denies pain. Reports numbness and edema of upper legs continues to improve. Ambulating with the aid a rolling walker. Reports moving bowel and urinating without complication.                                  Set-up films were reviewed.                                 The chart was checked. Physical Findings. . .  weight is 203 lb 8 oz (92.307 kg). His blood pressure is 128/87 and his pulse is 105. His respiration is 16 and oxygen saturation is 100%. . Weight essentially stable.  No significant changes. Impression . . . . . . . The patient is tolerating radiation. Plan . . . . . . . . . . . . Continue treatment as planned.  Added Carafate  ________________________________  Gary Conway, M.D.

## 2014-05-07 NOTE — Progress Notes (Signed)
Weight and vitals stable. Denies pain. Reports numbness and edema of upper legs continues to improve. Ambulating with the aid a rolling walker. Reports moving bowel and urinating without complication.

## 2014-05-10 ENCOUNTER — Ambulatory Visit
Admission: RE | Admit: 2014-05-10 | Discharge: 2014-05-10 | Disposition: A | Discharge status: Home / Self Care | Payer: 59 | Source: Ambulatory Visit | Attending: Radiation Oncology | Admitting: Radiation Oncology

## 2014-05-10 DIAGNOSIS — Z510 Encounter for antineoplastic radiation therapy: Secondary | ICD-10-CM | POA: Diagnosis not present

## 2014-05-11 ENCOUNTER — Ambulatory Visit
Admission: RE | Admit: 2014-05-11 | Discharge: 2014-05-11 | Disposition: A | Discharge status: Home / Self Care | Payer: 59 | Source: Ambulatory Visit | Attending: Radiation Oncology | Admitting: Radiation Oncology

## 2014-05-11 DIAGNOSIS — Z510 Encounter for antineoplastic radiation therapy: Secondary | ICD-10-CM | POA: Diagnosis not present

## 2014-05-12 ENCOUNTER — Ambulatory Visit
Admission: RE | Admit: 2014-05-12 | Discharge: 2014-05-12 | Disposition: A | Discharge status: Home / Self Care | Payer: 59 | Source: Ambulatory Visit | Attending: Radiation Oncology | Admitting: Radiation Oncology

## 2014-05-12 DIAGNOSIS — Z510 Encounter for antineoplastic radiation therapy: Secondary | ICD-10-CM | POA: Diagnosis not present

## 2014-05-13 ENCOUNTER — Encounter: Payer: Self-pay | Admitting: Radiation Oncology

## 2014-05-13 ENCOUNTER — Ambulatory Visit
Admission: RE | Admit: 2014-05-13 | Discharge: 2014-05-13 | Disposition: A | Discharge status: Home / Self Care | Payer: 59 | Source: Ambulatory Visit

## 2014-05-13 ENCOUNTER — Ambulatory Visit: Payer: 59

## 2014-05-13 ENCOUNTER — Ambulatory Visit
Admission: RE | Admit: 2014-05-13 | Discharge: 2014-05-13 | Disposition: A | Discharge status: Home / Self Care | Payer: 59 | Source: Ambulatory Visit | Attending: Radiation Oncology | Admitting: Radiation Oncology

## 2014-05-13 VITALS — BP 140/84 | HR 122 | Resp 16 | Wt 203.7 lb

## 2014-05-13 DIAGNOSIS — Z510 Encounter for antineoplastic radiation therapy: Secondary | ICD-10-CM | POA: Diagnosis not present

## 2014-05-13 DIAGNOSIS — D1809 Hemangioma of other sites: Secondary | ICD-10-CM

## 2014-05-13 NOTE — Progress Notes (Signed)
  Radiation Oncology         [phone] ________________________________  Name: Harel Repetto MRN: [id number]  Date: 05/13/2014  DOB: 02/13/1976  End of Treatment Note  ICD-9-CM    ICD-10-CM   Spinal hemangioma 228.09 D18.09     DIAGNOSIS: 38 yo gentleman with spinal hemangioma  Indication for treatment:  Curative, local control to prevent spinal cord injury       Radiation treatment dates:  04/15/2014-05/13/2014  Site/dose:   The T3-T5 vertebral bodies were treated to a total dose of 40 Gy in 20 fractions.  Beams/energy:   To treat the involved level the spine wall avoiding exposure to the lungs and esophagus and reduce the superficial dose to the patient's recent spinal surgical incision, the patient was treated using 4 field technique employing anterior, posterior, right posterior oblique, and left posterior oblique fields with a combination of 6 and 10 megavolt x-rays.  Narrative: The patient tolerated radiation treatment relatively well.   He remained a meal for through the course of radiation noted some subjective improvement in leg strength. He experienced mild esophageal irritation.  Plan: The patient has completed radiation treatment. The patient will return to radiation oncology clinic for routine followup in one month. I advised them to call or return sooner if they have any questions or concerns related to their recovery or treatment. ________________________________  Sheral Apley. Tammi Klippel, M.D.

## 2014-05-13 NOTE — Progress Notes (Signed)
Weight and vitals stable. Denies pain. Ambulating with rolling walker. Reports numbness and edema of upper legs continues to improve. Confirms he has control of bowel and bladder. Reports sore throat has resolved. One month follow up appointment card given. Understands to contact staff with future needs.

## 2014-05-13 NOTE — Progress Notes (Signed)
  Radiation Oncology         [phone] ________________________________  Name: Gary Conway MRN: [id number]  Date: 05/13/2014  DOB: 1976-05-28  Weekly Radiation Therapy Management    ICD-9-CM ICD-10-CM   1. Spinal hemangioma 228.09 D18.09     Current Dose: 40 Gy     Planned Dose:  40 Gy  Narrative . . . . . . . . The patient presents for routine under treatment assessment.                                   The patient is without complaint.                                 Set-up films were reviewed.                                 The chart was checked. Physical Findings. . .  weight is 203 lb 11.2 oz (92.398 kg). His blood pressure is 140/84 and his pulse is 122. His respiration is 16. . Weight essentially stable.  No significant changes. Impression . . . . . . . The patient is tolerating radiation. Plan . . . . . . . . . . . . Completed, fu in one month  ________________________________  Sheral Apley. Tammi Klippel, M.D.

## 2014-06-07 ENCOUNTER — Encounter: Payer: Self-pay | Admitting: Radiation Oncology

## 2014-06-07 ENCOUNTER — Telehealth: Payer: Self-pay | Admitting: *Deleted

## 2014-06-07 ENCOUNTER — Ambulatory Visit
Admission: RE | Admit: 2014-06-07 | Discharge: 2014-06-07 | Disposition: A | Discharge status: Home / Self Care | Payer: 59 | Source: Ambulatory Visit | Attending: Radiation Oncology | Admitting: Radiation Oncology

## 2014-06-07 VITALS — BP 141/84 | HR 114 | Resp 16 | Wt 204.0 lb

## 2014-06-07 DIAGNOSIS — Z0289 Encounter for other administrative examinations: Secondary | ICD-10-CM

## 2014-06-07 DIAGNOSIS — D1809 Hemangioma of other sites: Secondary | ICD-10-CM

## 2014-06-07 MED ORDER — CYCLOBENZAPRINE HCL 5 MG PO TABS: 5.0000 mg | ORAL_TABLET | Freq: Three times a day (TID) | ORAL | Status: DC | PRN

## 2014-06-07 NOTE — Progress Notes (Signed)
Esophagitis has resolved. Denies pain. Requesting refill of flexeril. Reports legs continue to feel stiff and cramp often. Also, patient reports edema of both legs. Slow steady gait noted with assistance of rolling walker. Weight and vitals stable.

## 2014-06-07 NOTE — Addendum Note (Signed)
Encounter addended by: Lora Paula, MD on: 06/07/2014 11:45 AM<BR>     Documentation filed: [employer], Flowsheet VN, Orders

## 2014-06-07 NOTE — Progress Notes (Signed)
  Radiation Oncology         [phone] ________________________________  Name: Gary Conway MRN: [id number]  Date: 06/07/2014  DOB: [birth year]  Follow-Up Visit Note  CC: Elizabeth Palau, MD  Elizabeth Palau, *  Diagnosis:   39 yo gentleman with spinal hemangioma s/p curative, local radiotherapy  04/15/2014-05/13/2014 where the T3-T5 vertebral bodies were treated to a total dose of 40 Gy in 20 fractions    ICD-9-CM ICD-10-CM   1. Spinal hemangioma 228.09 D18.09     Interval Since Last Radiation:  4  weeks  Narrative:  The patient returns today for routine follow-up.  Esophagitis has resolved. Denies pain. Requesting refill of flexeril. Reports legs continue to feel stiff and cramp often. Also, patient reports edema of both legs. Slow steady gait noted with assistance of rolling walker. Weight and vitals stable.                               ALLERGIES:  has No Known Allergies.  Meds: Current Outpatient Prescriptions  Medication Sig Dispense Refill  . cyclobenzaprine (FLEXERIL) 5 MG tablet Take 5 mg by mouth 3 (three) times daily as needed for muscle spasms.    Marland Kitchen HYDROcodone-acetaminophen (NORCO) 10-325 MG per tablet     . metFORMIN (GLUCOPHAGE) 500 MG tablet Take 1 tablet (500 mg total) by mouth 2 (two) times daily with a meal. (Patient not taking: Reported on 06/07/2014) 60 tablet 1  . methylPREDNIsolone (MEDROL DOSPACK) 4 MG tablet     . naproxen (NAPROSYN) 500 MG tablet Take 1 tablet (500 mg total) by mouth 2 (two) times daily with a meal. (Patient not taking: Reported on 06/07/2014) 30 tablet 0  . oxyCODONE (OXY IR/ROXICODONE) 5 MG immediate release tablet Take 1 tablet (5 mg total) by mouth every 4 (four) hours as needed for severe pain. (Patient not taking: Reported on 06/07/2014) 30 tablet 0  . sucralfate (CARAFATE) 1 G tablet Take 1 tablet (1 g total) by mouth 4 (four) times daily -  with meals and at bedtime. 5 min before eating for esophagitis (Patient not taking:  Reported on 06/07/2014) 120 tablet 0   No current facility-administered medications for this encounter.    Physical Findings: The patient is in no acute distress. Patient is alert and oriented.  weight is 204 lb (92.534 kg). His blood pressure is 141/84 and his pulse is 114. His respiration is 16. .  No significant changes.  Impression:  The patient is recovering from the effects of radiation.   Plan:  Would suggest baseline post-radiation spinal MRI at 2-3 months.  Will defer to Dr. Annette Stable about next imaging.  Follow-up in brain/spine clinic after next MRI.    _____________________________________  Sheral Apley. Tammi Klippel, M.D.

## 2014-06-07 NOTE — Telephone Encounter (Signed)
Form,[employer] to Cassandra and Dr Leta Baptist to be completed 1-44-16.

## 2014-06-07 NOTE — Telephone Encounter (Signed)
CALLED PATIENT TO INFORM OF APPT. WITH DR. TANNENBAUM ON 07/09/14- ARRIVING @ 1 PM, LVM FOR A RETURN CALL

## 2014-06-16 NOTE — Telephone Encounter (Signed)
Form,[employer] received,completed by Dr Leta Baptist and [facility] faxed 06-16-14.

## 2014-07-19 ENCOUNTER — Telehealth: Payer: Self-pay | Admitting: Radiation Oncology

## 2014-07-19 NOTE — Telephone Encounter (Signed)
Received message left by patient. Returned his call. No answer. Left message.

## 2014-07-20 ENCOUNTER — Telehealth: Payer: Self-pay | Admitting: Radiation Oncology

## 2014-07-20 NOTE — Telephone Encounter (Signed)
Returned from lunch today and [employer] paperwork was on my desk for this patient. Janeece Riggers is requesting a copy of Dr. Johny Shears record to continue patient's disability coverage. Phoned patient explaining information has been received and forwarded on the Verdie Drown to fax. Also, explained to the patient that Juliann Pulse understands the information must be faxed by Friday, July 23, 2014. Patient verbalized understanding and expressed appreciation for the call.

## 2014-07-21 ENCOUNTER — Telehealth: Payer: Self-pay | Admitting: *Deleted

## 2014-07-21 NOTE — Telephone Encounter (Signed)
On 07-21-14 fax medical records to [employer] it was consult note, sim & tx planning note, end of tx note, follow up note

## 2014-09-28 ENCOUNTER — Telehealth: Payer: Self-pay | Admitting: *Deleted

## 2014-09-28 NOTE — Telephone Encounter (Signed)
On 09-28-14 fax medical records to [facility] , it was consult note, sim & planning note, end of tx note, follow up note

## 2014-10-20 ENCOUNTER — Ambulatory Visit
Admission: RE | Admit: 2014-10-20 | Discharge: 2014-10-20 | Disposition: A | Discharge status: Home / Self Care | Payer: 59 | Source: Ambulatory Visit | Attending: Radiation Oncology | Admitting: Radiation Oncology

## 2014-10-20 DIAGNOSIS — D1809 Hemangioma of other sites: Secondary | ICD-10-CM

## 2015-01-26 ENCOUNTER — Other Ambulatory Visit (HOSPITAL_COMMUNITY): Payer: Self-pay | Admitting: Neurosurgery

## 2015-01-26 ENCOUNTER — Ambulatory Visit: Payer: Self-pay | Admitting: Radiation Oncology

## 2015-01-26 DIAGNOSIS — D492 Neoplasm of unspecified behavior of bone, soft tissue, and skin: Secondary | ICD-10-CM

## 2015-02-02 ENCOUNTER — Ambulatory Visit (HOSPITAL_COMMUNITY)
Admission: RE | Admit: 2015-02-02 | Discharge: 2015-02-02 | Disposition: A | Discharge status: Home / Self Care | Payer: 59 | Source: Ambulatory Visit | Attending: Neurosurgery | Admitting: Neurosurgery

## 2015-02-02 DIAGNOSIS — M5124 Other intervertebral disc displacement, thoracic region: Secondary | ICD-10-CM | POA: Insufficient documentation

## 2015-02-02 DIAGNOSIS — D492 Neoplasm of unspecified behavior of bone, soft tissue, and skin: Secondary | ICD-10-CM | POA: Insufficient documentation

## 2015-02-02 MED ORDER — GADOBENATE DIMEGLUMINE 529 MG/ML IV SOLN
20.0000 mL | Freq: Once | INTRAVENOUS | Status: AC | PRN
  Administered 2015-02-02: 18 mL via INTRAVENOUS

## 2016-02-14 ENCOUNTER — Telehealth: Payer: Self-pay | Admitting: *Deleted

## 2016-02-14 NOTE — Telephone Encounter (Signed)
On 02-14-16 fax medical records to [facility], [facility], [facility], [facility],  It was consult note, sim and planning note, end of tx note. Follow up note,

## 2016-08-14 IMAGING — CT CT CHEST W/ CM
1 of 5 series · 15 of 36 positions shown, 19 images · IV contrast (Iodine)
Comparison: None

CLINICAL DATA: One day post back surgery for tumor, workup for
cancer of unknown primary

EXAM:
CT CHEST, ABDOMEN, AND PELVIS WITH CONTRAST
TECHNIQUE: Multidetector CT imaging of the chest, abdomen and pelvis was
performed following the standard protocol during bolus
administration of intravenous contrast. Sagittal and coronal MPR
images reconstructed from axial data set.
CONTRAST:  100mL OMNIPAQUE IOHEXOL 300 MG/ML SOLN IV. Dilute oral
contrast.

[Series 201: cap with, idose (2) · axial · 0.92mm/px · z∈[+475,+1015]mm · 15 of 124 slices shown, 19 images]
[im 8/124  mediastinal]
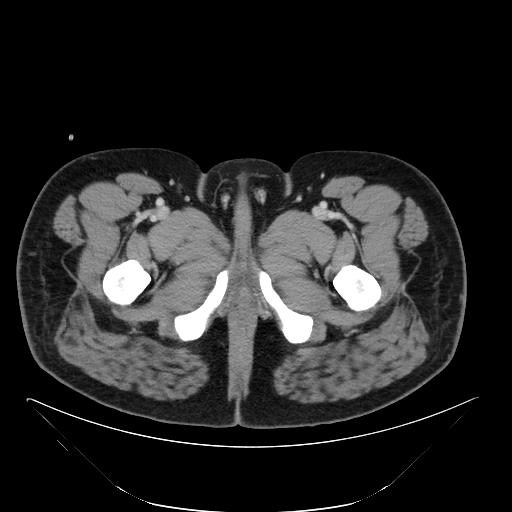
[im 8/124  lung]
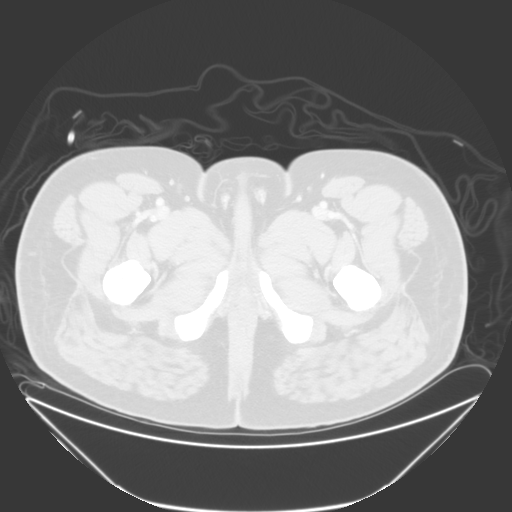
[im 16/124  lung]
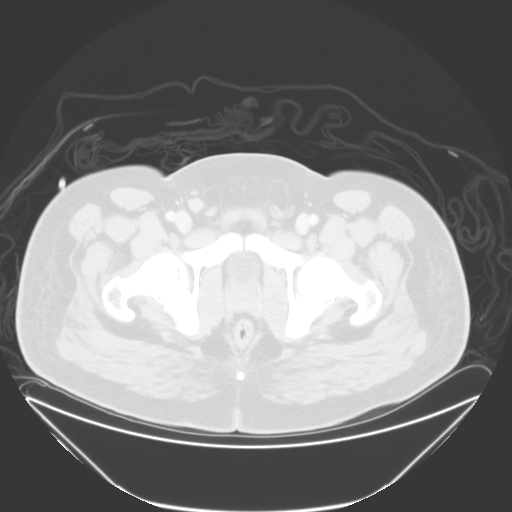
[im 24/124  lung]
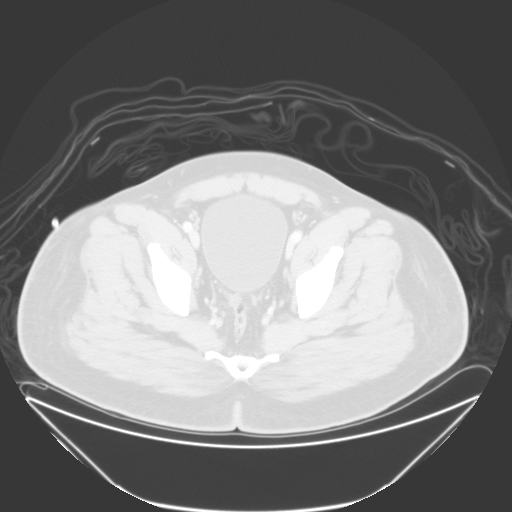
[im 31/124  lung]
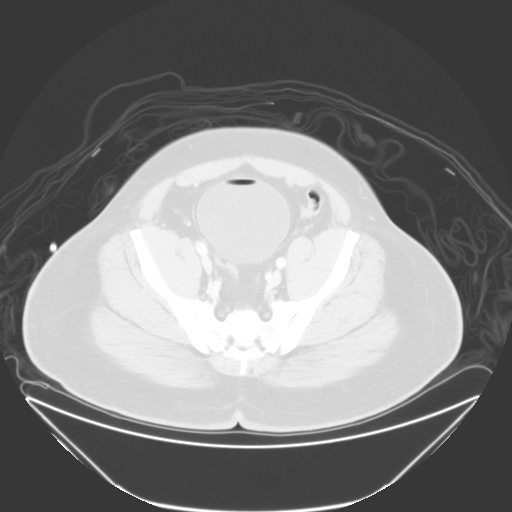
[im 39/124  mediastinal]
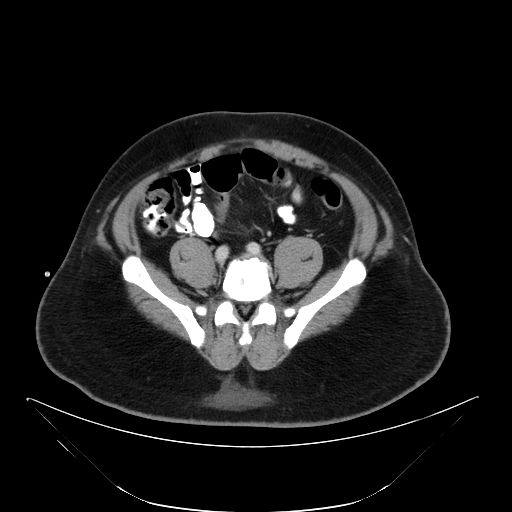
[im 39/124  lung]
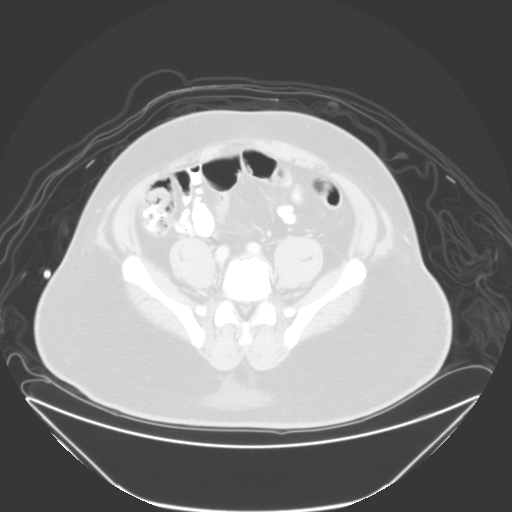
[im 47/124  lung]
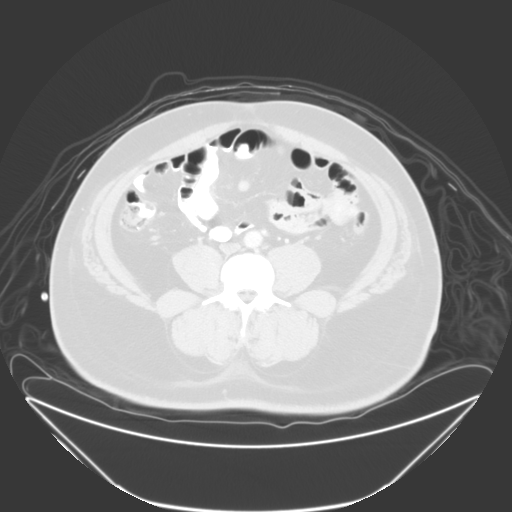
[im 54/124  lung]
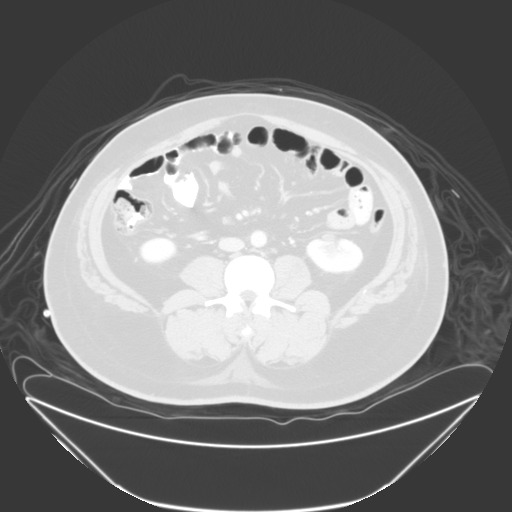
[im 62/124  lung]
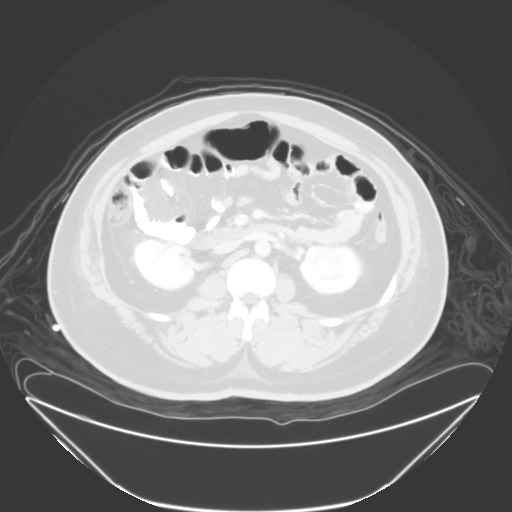
[im 70/124  mediastinal]
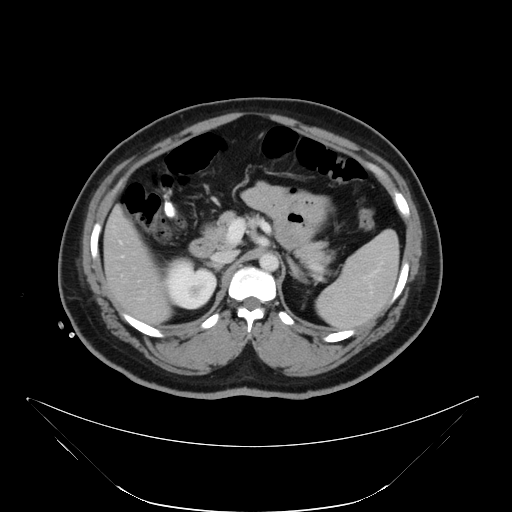
[im 70/124  lung]
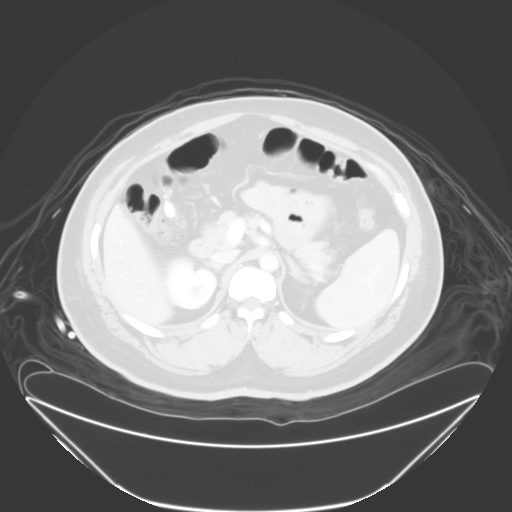
[im 77/124  lung]
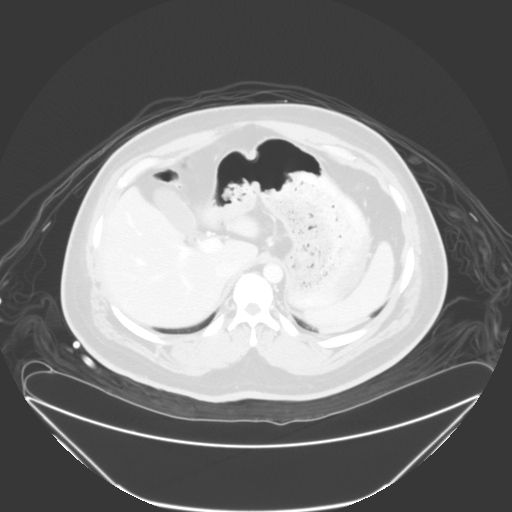
[im 85/124  lung]
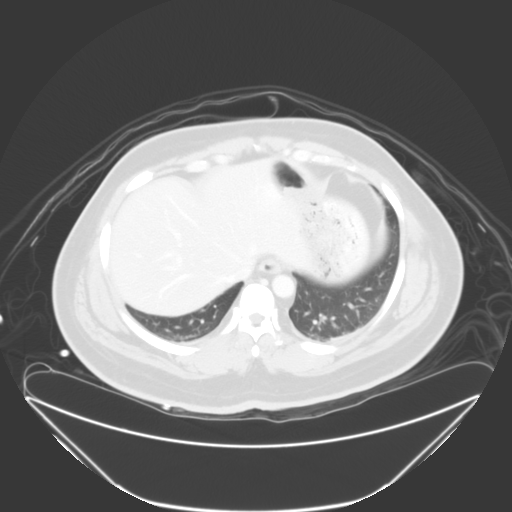
[im 93/124  lung]
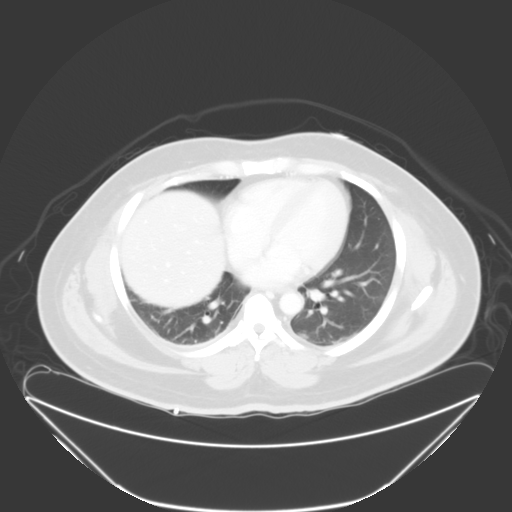
[im 100/124  mediastinal]
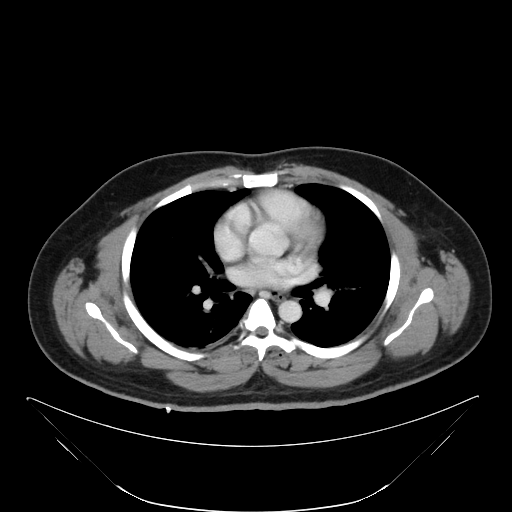
[im 100/124  lung]
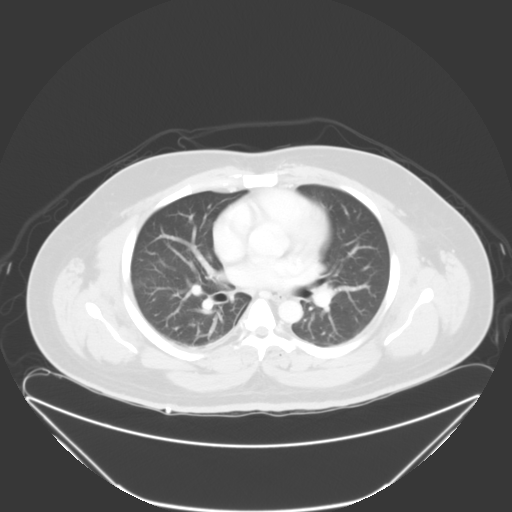
[im 108/124  lung]
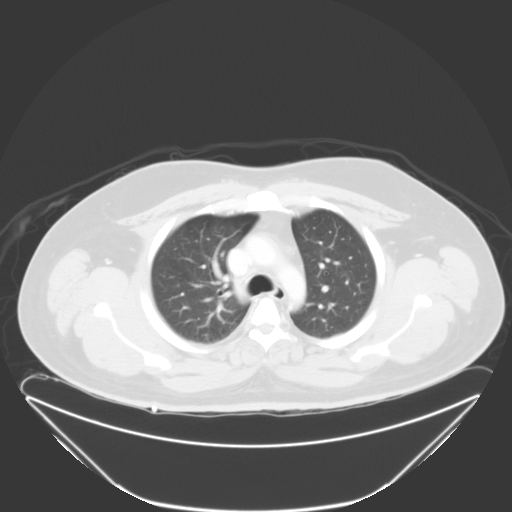
[im 116/124  lung]
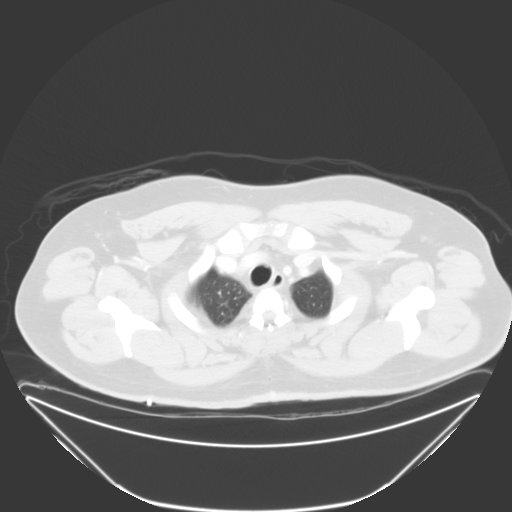

[15 of 36 positions shown; findings below may reference images not displayed]

FINDINGS: CT CHEST FINDINGS

Thoracic vascular structures grossly patent on nondedicated exam.

No thoracic adenopathy.

Tiny pulmonary nodule lingula image 32.

Dependent atelectasis in posterior lungs.

No infiltrate, pleural effusion, pneumothorax or additional
mass/nodule.

Destructive lesion within the T3 vertebral body again identified
with evidence of interval posterior upper thoracic decompression at
T1-T3 and surgical drain at the dorsal aspect of the thecal sac.

CT ABDOMEN AND PELVIS FINDINGS

Malrotated kidneys bilaterally, hila directed slightly anteriorly.

Liver, spleen, pancreas, kidneys, and adrenal glands otherwise
unremarkable.

Stomach contains food debris body and is inadequately opacified and
distended, no gross mass.

Large and small bowel loops unremarkable.

Normal appendix.

Air in urinary bladder question related to prior instrumentation.

LEFT inguinal hernia containing fat.

No mass, adenopathy, free fluid or inflammatory process.

Umbilical hernia containing fat.

Degenerative disc disease changes at L4-L5.1
IMPRESSION: Destructive lesion at T3 vertebral body.

Postsurgical changes at T1-T3.

No definite primary tumor identified.

Mildly malrotated kidneys.

Umbilical LEFT inguinal hernias.

Tiny nonspecific lingular pulmonary nodule; due to tumor at T3,
recommend followup imaging in 6 months.

## 2019-09-12 ENCOUNTER — Other Ambulatory Visit: Payer: Self-pay

## 2019-09-12 ENCOUNTER — Encounter (HOSPITAL_COMMUNITY): Payer: Self-pay | Admitting: Emergency Medicine

## 2019-09-12 ENCOUNTER — Observation Stay (HOSPITAL_COMMUNITY)
Admission: EM | Admit: 2019-09-12 | Discharge: 2019-09-14 | Disposition: A | Discharge status: Home / Self Care | Payer: 59 | Attending: Internal Medicine | Admitting: Internal Medicine

## 2019-09-12 DIAGNOSIS — E669 Obesity, unspecified: Secondary | ICD-10-CM | POA: Insufficient documentation

## 2019-09-12 DIAGNOSIS — E872 Acidosis, unspecified: Secondary | ICD-10-CM | POA: Diagnosis present

## 2019-09-12 DIAGNOSIS — E1110 Type 2 diabetes mellitus with ketoacidosis without coma: Secondary | ICD-10-CM | POA: Diagnosis not present

## 2019-09-12 DIAGNOSIS — E869 Volume depletion, unspecified: Secondary | ICD-10-CM | POA: Insufficient documentation

## 2019-09-12 DIAGNOSIS — N179 Acute kidney failure, unspecified: Secondary | ICD-10-CM | POA: Insufficient documentation

## 2019-09-12 DIAGNOSIS — E1100 Type 2 diabetes mellitus with hyperosmolarity without nonketotic hyperglycemic-hyperosmolar coma (NKHHC): Secondary | ICD-10-CM

## 2019-09-12 DIAGNOSIS — Z20822 Contact with and (suspected) exposure to covid-19: Secondary | ICD-10-CM | POA: Insufficient documentation

## 2019-09-12 DIAGNOSIS — E860 Dehydration: Secondary | ICD-10-CM

## 2019-09-12 LAB — URINALYSIS, ROUTINE W REFLEX MICROSCOPIC
Bacteria, UA: NONE SEEN
Bilirubin Urine: NEGATIVE
Glucose, UA: >=500 mg/dL — AB
Hgb urine dipstick: NEGATIVE
Ketones, ur: 5 mg/dL — AB
Leukocytes,Ua: NEGATIVE
Nitrite: NEGATIVE
Protein, ur: NEGATIVE mg/dL
Specific Gravity, Urine: 1.028 (ref 1.005–1.030)
pH: 6 (ref 5.0–8.0)

## 2019-09-12 LAB — BASIC METABOLIC PANEL
Anion gap: 12 (ref 5–15)
BUN: 12 mg/dL (ref 6–20)
CO2: 25 mmol/L (ref 22–32)
Calcium: 9.2 mg/dL (ref 8.9–10.3)
Chloride: 92 mmol/L — ABNORMAL LOW (ref 98–111)
Creatinine, Ser: 1.43 mg/dL — ABNORMAL HIGH (ref 0.61–1.24)
GFR calc Af Amer: >60 mL/min (ref >60)
GFR calc non Af Amer: 59 mL/min — ABNORMAL LOW (ref >60)
Glucose, Bld: 705 mg/dL (ref 70–99)
Potassium: 4.7 mmol/L (ref 3.5–5.1)
Sodium: 129 mmol/L — ABNORMAL LOW (ref 135–145)

## 2019-09-12 LAB — CBC
HCT: 42.5 % (ref 39.0–52.0)
Hemoglobin: 13.8 g/dL (ref 13.0–17.0)
MCH: 23 pg — ABNORMAL LOW (ref 26.0–34.0)
MCHC: 32.5 g/dL (ref 30.0–36.0)
MCV: 70.8 fL — ABNORMAL LOW (ref 80.0–100.0)
Platelets: 187 10*3/uL (ref 150–400)
RBC: 6 MIL/uL — ABNORMAL HIGH (ref 4.22–5.81)
RDW: 14.4 % (ref 11.5–15.5)
WBC: 6.5 10*3/uL (ref 4.0–10.5)
nRBC: 0 % (ref 0.0–0.2)

## 2019-09-12 LAB — LACTIC ACID, PLASMA: Lactic Acid, Venous: 2 mmol/L (ref 0.5–1.9)

## 2019-09-12 LAB — BETA-HYDROXYBUTYRIC ACID: Beta-Hydroxybutyric Acid: 2.31 mmol/L — ABNORMAL HIGH (ref 0.05–0.27)

## 2019-09-12 LAB — OSMOLALITY: Osmolality: 310 mOsm/kg — ABNORMAL HIGH (ref 275–295)

## 2019-09-12 LAB — CBG MONITORING, ED
Glucose-Capillary: >600 mg/dL (ref 70–99)
Glucose-Capillary: 461 mg/dL — ABNORMAL HIGH (ref 70–99)

## 2019-09-12 MED ORDER — DEXTROSE 50 % IV SOLN: 0.0000 mL | INTRAVENOUS | Status: DC | PRN

## 2019-09-12 MED ORDER — POTASSIUM CHLORIDE 10 MEQ/100ML IV SOLN
10.0000 meq | INTRAVENOUS | Status: AC
  Administered 2019-09-12 – 2019-09-13 (×2): 10 meq via INTRAVENOUS
  Filled 2019-09-12 (×2): qty 100

## 2019-09-12 MED ORDER — DEXTROSE-NACL 5-0.45 % IV SOLN: INTRAVENOUS | Status: DC

## 2019-09-12 MED ORDER — SODIUM CHLORIDE 0.9 % IV SOLN: INTRAVENOUS | Status: DC

## 2019-09-12 MED ORDER — INSULIN REGULAR(HUMAN) IN NACL 100-0.9 UT/100ML-% IV SOLN
INTRAVENOUS | Status: DC
  Administered 2019-09-12: 15 [IU]/h via INTRAVENOUS
  Filled 2019-09-12: qty 100

## 2019-09-12 MED ORDER — SODIUM CHLORIDE 0.9 % IV BOLUS
1000.0000 mL | INTRAVENOUS | Status: AC
  Administered 2019-09-12: 1000 mL via INTRAVENOUS

## 2019-09-12 NOTE — ED Triage Notes (Signed)
Pt reports blurred vision, frequent urination, and increased thirst x 4 days.  Pt denies history of diabetes but PMH showed diabetes.  Pt states he was never told he was diabetic but has a family history.

## 2019-09-13 ENCOUNTER — Encounter (HOSPITAL_COMMUNITY): Payer: Self-pay | Admitting: Internal Medicine

## 2019-09-13 DIAGNOSIS — E872 Acidosis, unspecified: Secondary | ICD-10-CM | POA: Diagnosis present

## 2019-09-13 DIAGNOSIS — E1110 Type 2 diabetes mellitus with ketoacidosis without coma: Secondary | ICD-10-CM | POA: Diagnosis present

## 2019-09-13 HISTORY — DX: Type 2 diabetes mellitus with ketoacidosis without coma: E11.10

## 2019-09-13 LAB — GLUCOSE, CAPILLARY
Glucose-Capillary: 360 mg/dL — ABNORMAL HIGH (ref 70–99)
Glucose-Capillary: 355 mg/dL — ABNORMAL HIGH (ref 70–99)
Glucose-Capillary: 248 mg/dL — ABNORMAL HIGH (ref 70–99)

## 2019-09-13 LAB — CBG MONITORING, ED
Glucose-Capillary: 158 mg/dL — ABNORMAL HIGH (ref 70–99)
Glucose-Capillary: 174 mg/dL — ABNORMAL HIGH (ref 70–99)
Glucose-Capillary: 220 mg/dL — ABNORMAL HIGH (ref 70–99)
Glucose-Capillary: 227 mg/dL — ABNORMAL HIGH (ref 70–99)
Glucose-Capillary: 371 mg/dL — ABNORMAL HIGH (ref 70–99)

## 2019-09-13 LAB — BASIC METABOLIC PANEL
Anion gap: 9 (ref 5–15)
BUN: 10 mg/dL (ref 6–20)
CO2: 22 mmol/L (ref 22–32)
Calcium: 8.3 mg/dL — ABNORMAL LOW (ref 8.9–10.3)
Chloride: 107 mmol/L (ref 98–111)
Creatinine, Ser: 0.96 mg/dL (ref 0.61–1.24)
GFR calc Af Amer: >60 mL/min (ref >60)
GFR calc non Af Amer: >60 mL/min (ref >60)
Glucose, Bld: 133 mg/dL — ABNORMAL HIGH (ref 70–99)
Potassium: 3.8 mmol/L (ref 3.5–5.1)
Sodium: 138 mmol/L (ref 135–145)

## 2019-09-13 LAB — HIV ANTIBODY (ROUTINE TESTING W REFLEX): HIV Screen 4th Generation wRfx: NONREACTIVE

## 2019-09-13 LAB — HEMOGLOBIN A1C
Hgb A1c MFr Bld: 12.9 % — ABNORMAL HIGH (ref 4.8–5.6)
Mean Plasma Glucose: 323.53 mg/dL

## 2019-09-13 LAB — BETA-HYDROXYBUTYRIC ACID
Beta-Hydroxybutyric Acid: 0.53 mmol/L — ABNORMAL HIGH (ref 0.05–0.27)
Beta-Hydroxybutyric Acid: 0.19 mmol/L (ref 0.05–0.27)

## 2019-09-13 LAB — MAGNESIUM: Magnesium: 2.1 mg/dL (ref 1.7–2.4)

## 2019-09-13 LAB — SARS CORONAVIRUS 2 (TAT 6-24 HRS): SARS Coronavirus 2: NEGATIVE

## 2019-09-13 LAB — LACTIC ACID, PLASMA: Lactic Acid, Venous: 1.8 mmol/L (ref 0.5–1.9)

## 2019-09-13 MED ORDER — ACETAMINOPHEN 650 MG RE SUPP: 650.0000 mg | Freq: Four times a day (QID) | RECTAL | Status: DC | PRN

## 2019-09-13 MED ORDER — INSULIN ASPART 100 UNIT/ML ~~LOC~~ SOLN: 0.0000 [IU] | Freq: Three times a day (TID) | SUBCUTANEOUS | Status: DC

## 2019-09-13 MED ORDER — POLYETHYLENE GLYCOL 3350 17 G PO PACK: 17.0000 g | PACK | Freq: Every day | ORAL | Status: DC | PRN

## 2019-09-13 MED ORDER — INSULIN ASPART 100 UNIT/ML ~~LOC~~ SOLN
3.0000 [IU] | Freq: Three times a day (TID) | SUBCUTANEOUS | Status: DC
  Administered 2019-09-13 – 2019-09-14 (×2): 3 [IU] via SUBCUTANEOUS

## 2019-09-13 MED ORDER — ONDANSETRON HCL 4 MG PO TABS: 4.0000 mg | ORAL_TABLET | Freq: Four times a day (QID) | ORAL | Status: DC | PRN

## 2019-09-13 MED ORDER — INSULIN ASPART 100 UNIT/ML ~~LOC~~ SOLN
0.0000 [IU] | Freq: Three times a day (TID) | SUBCUTANEOUS | Status: DC
  Administered 2019-09-13: 12:00:00 9 [IU] via SUBCUTANEOUS
  Administered 2019-09-13: 3 [IU] via SUBCUTANEOUS

## 2019-09-13 MED ORDER — ENOXAPARIN SODIUM 40 MG/0.4ML ~~LOC~~ SOLN
40.0000 mg | SUBCUTANEOUS | Status: DC
  Administered 2019-09-13 – 2019-09-14 (×2): 40 mg via SUBCUTANEOUS
  Filled 2019-09-13 (×3): qty 0.4

## 2019-09-13 MED ORDER — LACTATED RINGERS IV SOLN: INTRAVENOUS | Status: DC

## 2019-09-13 MED ORDER — INSULIN ASPART 100 UNIT/ML ~~LOC~~ SOLN
0.0000 [IU] | Freq: Every day | SUBCUTANEOUS | Status: DC
  Administered 2019-09-13: 23:00:00 2 [IU] via SUBCUTANEOUS

## 2019-09-13 MED ORDER — SODIUM CHLORIDE 0.9 % IV BOLUS
1000.0000 mL | Freq: Once | INTRAVENOUS | Status: AC
  Administered 2019-09-13: 1000 mL via INTRAVENOUS

## 2019-09-13 MED ORDER — ONDANSETRON HCL 4 MG/2ML IJ SOLN: 4.0000 mg | Freq: Four times a day (QID) | INTRAMUSCULAR | Status: DC | PRN

## 2019-09-13 MED ORDER — INSULIN ASPART PROT & ASPART (70-30 MIX) 100 UNIT/ML ~~LOC~~ SUSP
18.0000 [IU] | Freq: Two times a day (BID) | SUBCUTANEOUS | Status: DC
  Administered 2019-09-13: 11:00:00 18 [IU] via SUBCUTANEOUS
  Filled 2019-09-13 (×2): qty 10

## 2019-09-13 MED ORDER — ACETAMINOPHEN 325 MG PO TABS: 650.0000 mg | ORAL_TABLET | Freq: Four times a day (QID) | ORAL | Status: DC | PRN

## 2019-09-13 MED ORDER — INSULIN GLARGINE 100 UNIT/ML ~~LOC~~ SOLN
30.0000 [IU] | Freq: Every day | SUBCUTANEOUS | Status: DC
  Administered 2019-09-13: 30 [IU] via SUBCUTANEOUS
  Filled 2019-09-13 (×2): qty 0.3

## 2019-09-13 MED ORDER — INSULIN ASPART 100 UNIT/ML ~~LOC~~ SOLN
0.0000 [IU] | Freq: Three times a day (TID) | SUBCUTANEOUS | Status: DC
  Administered 2019-09-13: 18:00:00 15 [IU] via SUBCUTANEOUS
  Administered 2019-09-14: 8 [IU] via SUBCUTANEOUS
  Administered 2019-09-14: 11 [IU] via SUBCUTANEOUS

## 2019-09-13 NOTE — ED Provider Notes (Addendum)
[facility] EMERGENCY DEPARTMENT Provider Note   CSN: [id number] Arrival date & time: 09/12/19  1754     History Chief Complaint  Patient presents with  . Urinary Frequency  . Blurred Vision  . Polydipsia    Gary Conway is a 44 y.o. male with a history of diabetes mellitus type 2, benign hemangioma of the spine who presents the emergency department with polyuria, polydipsia, and bilateral blurred vision for the last week.  Symptoms have been constant and worsening since onset.  No known aggravating or alleviating factors.  He is concerned that he is dehydrated.  He has also noticed a metallic taste in his mouth and notes that when he has eaten food over the last few days that it tastes differently.  He denies fever, chills, weakness, numbness, nausea, vomiting, abdominal pain, confusion, dysuria, hematuria, chest pain, or shortness of breath.  No treatment prior to arrival.  Per chart review, the patient has a diagnosis of diabetes mellitus type 2 from a hospital admission in October 2015.  He was treated with insulin during his admission and was discharged home with Metformin.  He has not taken Metformin in several years.  He has not established with a primary care provider.  The history is provided by the patient. No language interpreter was used.       Past Medical History:  Diagnosis Date  . Diabetes mellitus, type II ([place]) 2015   Diagnosed during 2015 admission to [facility]  . Hemangioma of spine 2015   S/P excision at [facility] with pathology favoring benign Hemangioma    Patient Active Problem List   Diagnosis Date Noted  . Diabetic ketoacidosis without coma associated with type 2 diabetes mellitus ([place]) 09/13/2019  . Lactic acidosis 09/13/2019  . Benign neoplasm of spinal cord ([place]) 03/27/2014  . Obesity (BMI 30-39.9) 03/24/2014  . DM (diabetes mellitus), type 2, uncontrolled ([place]) 03/24/2014  . Spinal hemangioma 03/23/2014  . Leukocytosis  03/23/2014  . Paraplegia ([place]) 03/22/2014  . Thoracic myelopathy 03/22/2014  . Numbness and tingling of both legs 03/22/2014  . Leg weakness, bilateral 03/22/2014    Past Surgical History:  Procedure Laterality Date  . LAMINECTOMY N/A 03/22/2014   Procedure: THORACIC two - four  LAMINECTOMY FOR TUMOR;  Surgeon: Charlie Pitter, MD;  Location: [facility] NEURO ORS;  Service: Neurosurgery;  Laterality: N/A;  Throacic two  - four laminectomy for tumor  . LEG SURGERY         Family History  Problem Relation Age of Onset  . Breast cancer Mother     Social History   Tobacco Use  . Smoking status: Never Smoker  . Smokeless tobacco: Never Used  Substance Use Topics  . Alcohol use: Yes  . Drug use: No    Home Medications Prior to Admission medications   Medication Sig Start Date End Date Taking? Authorizing Provider  blood glucose meter kit and supplies KIT Dispense based on patient and insurance preference. Use up to four times daily as directed. (FOR ICD-9 250.00, 250.01). For QAC - HS accuchecks. 09/14/19   Thurnell Lose, MD  insulin glargine (LANTUS) 100 UNIT/ML injection Inject 0.45 mLs (45 Units total) into the skin at bedtime. Dispense insulin pen if approved, if not dispense as needed syringes and needles for 1 month supply. Can switch to Levemir. Diagnosis E 11.65. 09/14/19   Thurnell Lose, MD  insulin lispro (HUMALOG) 100 UNIT/ML injection Substitute to any brand approved like Novalog .Before each  meal 3 times a day, 140-199 - 2 units, 200-250 - 4 units, 251-299 - 6 units,  300-349 - 8 units,  350 or above 10 units. Dispense syringes and needles as needed, Ok to switch to PEN if approved. DX DM2, Code E11.65 09/14/19 09/13/20  Thurnell Lose, MD  Insulin Syringe-Needle U-100 25G X 1" 1 ML MISC For 4 times a day insulin SQ, 1 month supply. Diagnosis E11.65 09/14/19   Thurnell Lose, MD  metFORMIN (GLUCOPHAGE) 500 MG tablet Take 1 tablet (500 mg total) by mouth 2 (two) times daily  with a meal. 09/14/19   Thurnell Lose, MD    Allergies    Patient has no known allergies.  Review of Systems   Review of Systems  Constitutional: Negative for appetite change, chills and fever.  HENT: Negative for congestion and sore throat.   Eyes: Positive for visual disturbance (blurred).  Respiratory: Negative for shortness of breath and wheezing.   Cardiovascular: Negative for chest pain, palpitations and leg swelling.  Gastrointestinal: Negative for abdominal pain, diarrhea, nausea and vomiting.  Endocrine: Positive for polydipsia and polyuria.  Genitourinary: Negative for dysuria, frequency, hematuria and urgency.  Musculoskeletal: Negative for back pain.  Skin: Negative for rash.  Allergic/Immunologic: Negative for immunocompromised state.  Neurological: Negative for dizziness, seizures, syncope, weakness, numbness and headaches.  Psychiatric/Behavioral: Negative for confusion.    Physical Exam Updated Vital Signs BP (!) 132/98 (BP Location: Left Arm)   Pulse (!) 108   Temp 98.8 F (37.1 C) (Oral)   Resp 18   SpO2 100%   Physical Exam Vitals and nursing note reviewed.  Constitutional:      General: He is not in acute distress.    Appearance: He is well-developed. He is not ill-appearing, toxic-appearing or diaphoretic.  HENT:     Head: Normocephalic.     Mouth/Throat:     Mouth: Mucous membranes are dry.     Pharynx: No oropharyngeal exudate or posterior oropharyngeal erythema.     Comments: Lips are dry and cracked.  Tongue is dry. Eyes:     Conjunctiva/sclera: Conjunctivae normal.  Cardiovascular:     Rate and Rhythm: Regular rhythm. Tachycardia present.     Heart sounds: No murmur. No friction rub. No gallop.   Pulmonary:     Effort: Pulmonary effort is normal. No respiratory distress.     Breath sounds: No stridor. No wheezing, rhonchi or rales.  Chest:     Chest wall: No tenderness.  Abdominal:     General: There is no distension.     Palpations:  Abdomen is soft. There is no mass.     Tenderness: There is no abdominal tenderness. There is no right CVA tenderness, left CVA tenderness, guarding or rebound.     Hernia: No hernia is present.     Comments: Abdomen is soft, nontender, nondistended.  Musculoskeletal:     Cervical back: Neck supple.  Skin:    General: Skin is warm and dry.     Capillary Refill: Capillary refill takes 2 to 3 seconds.  Neurological:     General: No focal deficit present.     Mental Status: He is alert.  Psychiatric:        Behavior: Behavior normal.     ED Results / Procedures / Treatments   Labs (all labs ordered are listed, but only abnormal results are displayed) Labs Reviewed  BASIC METABOLIC PANEL - Abnormal; Notable for the following components:  Result Value   Sodium 129 (*)    Chloride 92 (*)    Glucose, Bld 705 (*)    Creatinine, Ser 1.43 (*)    GFR calc non Af Amer 59 (*)    All other components within normal limits  CBC - Abnormal; Notable for the following components:   RBC 6.00 (*)    MCV 70.8 (*)    MCH 23.0 (*)    All other components within normal limits  URINALYSIS, ROUTINE W REFLEX MICROSCOPIC - Abnormal; Notable for the following components:   Color, Urine STRAW (*)    Glucose, UA >=500 (*)    Ketones, ur 5 (*)    All other components within normal limits  OSMOLALITY - Abnormal; Notable for the following components:   Osmolality 310 (*)    All other components within normal limits  BETA-HYDROXYBUTYRIC ACID - Abnormal; Notable for the following components:   Beta-Hydroxybutyric Acid 2.31 (*)    All other components within normal limits  BETA-HYDROXYBUTYRIC ACID - Abnormal; Notable for the following components:   Beta-Hydroxybutyric Acid 0.53 (*)    All other components within normal limits  LACTIC ACID, PLASMA - Abnormal; Notable for the following components:   Lactic Acid, Venous 2.0 (*)    All other components within normal limits  HEMOGLOBIN A1C - Abnormal;  Notable for the following components:   Hgb A1c MFr Bld 12.9 (*)    All other components within normal limits  BASIC METABOLIC PANEL - Abnormal; Notable for the following components:   Glucose, Bld 133 (*)    Calcium 8.3 (*)    All other components within normal limits  GLUCOSE, CAPILLARY - Abnormal; Notable for the following components:   Glucose-Capillary 360 (*)    All other components within normal limits  GLUCOSE, CAPILLARY - Abnormal; Notable for the following components:   Glucose-Capillary 355 (*)    All other components within normal limits  CBC WITH DIFFERENTIAL/PLATELET - Abnormal; Notable for the following components:   Hemoglobin 11.7 (*)    HCT 35.3 (*)    MCV 70.2 (*)    MCH 23.3 (*)    Platelets 149 (*)    All other components within normal limits  COMPREHENSIVE METABOLIC PANEL - Abnormal; Notable for the following components:   Sodium 134 (*)    Glucose, Bld 263 (*)    Calcium 8.6 (*)    Total Protein 5.9 (*)    Albumin 2.7 (*)    All other components within normal limits  GLUCOSE, CAPILLARY - Abnormal; Notable for the following components:   Glucose-Capillary 248 (*)    All other components within normal limits  GLUCOSE, CAPILLARY - Abnormal; Notable for the following components:   Glucose-Capillary 264 (*)    All other components within normal limits  GLUCOSE, CAPILLARY - Abnormal; Notable for the following components:   Glucose-Capillary 311 (*)    All other components within normal limits  GLUCOSE, CAPILLARY - Abnormal; Notable for the following components:   Glucose-Capillary 296 (*)    All other components within normal limits  CBG MONITORING, ED - Abnormal; Notable for the following components:   Glucose-Capillary >600 (*)    All other components within normal limits  CBG MONITORING, ED - Abnormal; Notable for the following components:   Glucose-Capillary 461 (*)    All other components within normal limits  CBG MONITORING, ED - Abnormal; Notable  for the following components:   Glucose-Capillary 371 (*)    All other components  within normal limits  CBG MONITORING, ED - Abnormal; Notable for the following components:   Glucose-Capillary 220 (*)    All other components within normal limits  CBG MONITORING, ED - Abnormal; Notable for the following components:   Glucose-Capillary 158 (*)    All other components within normal limits  CBG MONITORING, ED - Abnormal; Notable for the following components:   Glucose-Capillary 174 (*)    All other components within normal limits  CBG MONITORING, ED - Abnormal; Notable for the following components:   Glucose-Capillary 227 (*)    All other components within normal limits  SARS CORONAVIRUS 2 (TAT 6-24 HRS)  LACTIC ACID, PLASMA  BETA-HYDROXYBUTYRIC ACID  HIV ANTIBODY (ROUTINE TESTING W REFLEX)  MAGNESIUM  MAGNESIUM    EKG None  Radiology No results found.  Procedures .Critical Care Performed by: Joanne Gavel, PA-C Authorized by: Joanne Gavel, PA-C   Critical care provider statement:    Critical care time (minutes):  40   Critical care time was exclusive of:  Separately billable procedures and treating other patients and teaching time   Critical care was necessary to treat or prevent imminent or life-threatening deterioration of the following conditions:  Metabolic crisis   Critical care was time spent personally by me on the following activities:  Obtaining history from patient or surrogate, ordering and review of radiographic studies, ordering and review of laboratory studies, pulse oximetry, re-evaluation of patient's condition, review of old charts, ordering and performing treatments and interventions, examination of patient, evaluation of patient's response to treatment and development of treatment plan with patient or surrogate   I assumed direction of critical care for this patient from another provider in my specialty: no     (including critical care  time)  Medications Ordered in ED Medications  potassium chloride 10 mEq in 100 mL IVPB (0 mEq Intravenous Stopped 09/13/19 0130)  sodium chloride 0.9 % bolus 1,000 mL (0 mLs Intravenous Stopped 09/13/19 0021)  sodium chloride 0.9 % bolus 1,000 mL (0 mLs Intravenous Stopped 09/13/19 1696)    ED Course  I have reviewed the triage vital signs and the nursing notes.  Pertinent labs & imaging results that were available during my care of the patient were reviewed by me and considered in my medical decision making (see chart for details).    MDM Rules/Calculators/A&P                      44 year old male with a history of diabetes mellitus type 2, benign hemangioma of the spine presenting from home with 1 week of polyuria, polydipsia.  He has also developed bilateral blurred vision over the last few days that has persisted.  He has noted a metallic taste in his mouth, and he has noticed a change in taste and food over the last few days.  Mildly tachycardic on arrival.  Vital signs are otherwise unremarkable.  Glucose is significantly elevated at 705.  Anion gap and bicarb are normal.  Beta hydroxybutyric acid is elevated at 2.31.  He appears to be in HHS.  Lactate is 2.0.  He has an AKI with creatinine elevated at 1.43.  UA with significant glucosuria.  Minimal ketonuria.  No evidence of infection.  Will start the patient on Endo tool.  Glucose responded well and improved to 220 after several hours.  The patient was discussed with Dr. Dina Rich, attending physician.  A1c is 12.9.  Given his labs in the ER today and lack  of established outpatient follow-up, the patient would benefit from admission for further management and evaluation to prevent further clinical decompensation.  Discussed the patient with Dr.Shalhoub who is in agreement and will accept the patient for admission.  The patient appears reasonably stabilized for admission considering the current resources, flow, and capabilities available  in the ED at this time, and I doubt any other [facility] requiring further screening and/or treatment in the ED prior to admission.  Final Clinical Impression(s) / ED Diagnoses Final diagnoses:  Hyperosmolar hyperglycemic state (HHS) ([place])  Dehydration    Rx / DC Orders ED Discharge Orders         Ordered    metFORMIN (GLUCOPHAGE) 500 MG tablet  2 times daily with meals,   Status:  Discontinued     09/14/19 0914    insulin glargine (LANTUS) 100 UNIT/ML injection  Daily at bedtime,   Status:  Discontinued    Note to Pharmacy: Can switch to Levemir or any cheaper alternative at the same dose.   09/14/19 0914    insulin aspart (NOVOLOG) 100 UNIT/ML injection  Status:  Discontinued     09/14/19 0914    blood glucose meter kit and supplies KIT     09/14/19 0914    Insulin Syringe-Needle U-100 25G X 1" 1 ML MISC     09/14/19 0914    Increase activity slowly     09/14/19 0914    Discharge instructions    Comments: Follow with Primary MD Kennon Holter, Denton Meek, MD (Inactive) in 7 days   Get CBC, CMPchecked next visit within 1 week by Primary MD   Activity: As tolerated with Full fall precautions use walker/cane & assistance as needed  Disposition Home   Diet: Heart Healthy Low Carb  Accuchecks 4 times/day, Once in AM empty stomach and then before each meal. Log in all results and show them to your Prim.MD in 3 days. If any glucose reading is under 80 or above 300 call your Prim MD immidiately. Follow Low glucose instructions for glucose under 80 as instructed.  Special Instructions: If you have smoked or chewed Tobacco  in the last 2 yrs please stop smoking, stop any regular Alcohol  and or any Recreational drug use.  On your next visit with your primary care physician please Get Medicines reviewed and adjusted.  Please request your Prim.MD to go over all Hospital Tests and Procedure/Radiological results at the follow up, please get all Hospital records sent to your Prim MD by signing  hospital release before you go home.  If you experience worsening of your admission symptoms, develop shortness of breath, life threatening emergency, suicidal or homicidal thoughts you must seek medical attention immediately by calling 911 or calling your MD immediately  if symptoms less severe.  You Must read complete instructions/literature along with all the possible adverse reactions/side effects for all the Medicines you take and that have been prescribed to you. Take any new Medicines after you have completely understood and accpet all the possible adverse reactions/side effects.   Do not drive, operate heavy machinery, perform activities at heights, swimming or participation in water activities or provide baby sitting services if your were admitted for syncope or siezures until you have seen by Primary MD or a Neurologist and advised to do so again.   09/14/19 0914    insulin aspart (NOVOLOG) 100 UNIT/ML injection  Status:  Discontinued     09/14/19 0922    insulin glargine (LANTUS) 100 UNIT/ML injection  Daily at bedtime,   Status:  Discontinued    Note to Pharmacy: Can switch to Levemir or any cheaper alternative at the same dose.   09/14/19 0922    metFORMIN (GLUCOPHAGE) 500 MG tablet  2 times daily with meals     09/14/19 0922    insulin aspart protamine- aspart (NOVOLOG MIX 70/30) (70-30) 100 UNIT/ML injection  2 times daily with meals,   Status:  Discontinued     09/14/19 1030    insulin aspart (NOVOLOG) 100 UNIT/ML injection  Status:  Discontinued     09/14/19 1030    insulin glargine (LANTUS) 100 UNIT/ML injection  Daily at bedtime    Note to Pharmacy: Can switch to Levemir or any cheaper alternative at the same dose.   09/14/19 1241    insulin lispro (HUMALOG) 100 UNIT/ML injection     09/14/19 1241           Thresa Dozier A, PA-C 09/13/19 0915    Merryl Hacker, MD 09/14/19 0229    Joline Maxcy A, PA-C 09/28/19 7353    Merryl Hacker, MD 09/29/19  [phone]

## 2019-09-13 NOTE — ED Notes (Signed)
Breakfast ordered 

## 2019-09-13 NOTE — Progress Notes (Signed)
    Gary Conway, is a 44 y.o. male, DOB - 03-Dec-1975, [id number]  Patient admitted few hrs ago for poorly controlled DM2, place on Lantus + ISS, DM education.   Vitals:   09/13/19 0716 09/13/19 0730 09/13/19 0800 09/13/19 1001  BP: (!) 134/97 (!) 130/95 (!) 135/106 124/78  Pulse: 95 96 88 99  Resp: 20 (!) 22 20 20   Temp:    98.6 F (37 C)  TempSrc:    Oral  SpO2: 99% 99% 100% 99%        Data Review   Micro Results Recent Results (from the past 240 hour(s))  SARS CORONAVIRUS 2 (TAT 6-24 HRS) Nasopharyngeal Nasopharyngeal Swab     Status: None   Collection Time: 09/13/19  3:55 AM   Specimen: Nasopharyngeal Swab  Result Value Ref Range Status   SARS Coronavirus 2 NEGATIVE NEGATIVE Final    Comment: (NOTE) SARS-CoV-2 target nucleic acids are NOT DETECTED. The SARS-CoV-2 RNA is generally detectable in upper and lower respiratory specimens during the acute phase of infection. Negative results do not preclude SARS-CoV-2 infection, do not rule out co-infections with other pathogens, and should not be used as the sole basis for treatment or other patient management decisions. Negative results must be combined with clinical observations, patient history, and epidemiological information. The expected result is Negative. Fact Sheet for Patients: SugarRoll.be Fact Sheet for Healthcare Providers: https://www.woods-mathews.com/ This test is not yet approved or cleared by the [place] FDA and  has been authorized for detection and/or diagnosis of SARS-CoV-2 by FDA under an Emergency Use Authorization (EUA). This EUA will remain  in effect (meaning this test can be used) for the duration of the COVID-19 declaration under Section 56 4(b)(1) of the Act, 21 U.S.C. section 360bbb-3(b)(1), unless the authorization is terminated or revoked sooner. Performed  at [facility], [place] [address] [place], [place] [address]     Radiology Reports No results found.  CBC Recent Labs  Lab 09/12/19 1859  WBC 6.5  HGB 13.8  HCT 42.5  PLT 187  MCV 70.8*  MCH 23.0*  MCHC 32.5  RDW 14.4    Chemistries  Recent Labs  Lab 09/12/19 1859 09/13/19 0604 09/13/19 0924  NA 129* 138  --   K 4.7 3.8  --   CL 92* 107  --   CO2 25 22  --   GLUCOSE 705* 133*  --   BUN 12 10  --   CREATININE 1.43* 0.96  --   CALCIUM 9.2 8.3*  --   MG  --   --  2.1   ------------------------------------------------------------------------------------------------------------------ CrCl cannot be calculated (Unknown ideal weight.). ------------------------------------------------------------------------------------------------------------------ Recent Labs    09/13/19 0226  HGBA1C 12.9*   ------------------------------------------------------------------------------------------------------------------ No results for input(s): CHOL, HDL, LDLCALC, TRIG, CHOLHDL, LDLDIRECT in the last 72 hours. ------------------------------------------------------------------------------------------------------------------ No results for input(s): TSH, T4TOTAL, T3FREE, THYROIDAB in the last 72 hours.  Invalid input(s): FREET3 ------------------------------------------------------------------------------------------------------------------ No results for input(s): VITAMINB12, FOLATE, FERRITIN, TIBC, IRON, RETICCTPCT in the last 72 hours.  Coagulation profile No results for input(s): INR, PROTIME in the last 168 hours.  No results for input(s): DDIMER in the last 72 hours.  Cardiac Enzymes No results for input(s): CKMB, TROPONINI, MYOGLOBIN in the last 168 hours.  Invalid input(s): CK ------------------------------------------------------------------------------------------------------------------ Invalid input(s): POCBNP   Signature  Lala Lund M.D on 09/13/2019  at 1:57 PM   -  To page go to [email or web address]

## 2019-09-13 NOTE — H&P (Signed)
History and Physical    Gary Conway [id number] DOB: January 03, 1976 DOA: 09/12/2019  PCP: Elizabeth Palau, MD (Inactive)  Patient coming from: Home   Chief Complaint:  Chief Complaint  Patient presents with  . Urinary Frequency  . Blurred Vision  . Polydipsia     HPI:  44 year old male with past medical history of diabetes mellitus type 2 diagnosed in 2015 and previous benign hemangioma of the spine who presents to [facility] emergency department with a 1 week history of progressive polydipsia and polyuria with blurred vision.  Patient explains that approximately 7 days ago he began to experience frequent thirst.  This was associated with frequent urination.  Patient noticed that as his symptoms continue to worsen he also began to develop generalized blurry vision.  Patient denies generalized weakness, nausea, vomiting, abdominal pain.  Patient denies fevers, recent travel, sick contacts or confirmed contact with COVID-19.  Of note, review of hospitalization from 2015 reveals notes stating that patient was diagnosed with diabetes mellitus during that hospitalization, the patient denies any knowledge of this.  Patient eventually presented to [facility] emergency department for evaluation due to progressively worsening blurry vision polydipsia and polyuria.  Upon evaluation in the emergency department initial blood sugar was found to be greater than 700.  Found to have a slightly widened anion gap with increased beta hydroxybutyrate concerning for early diabetic ketoacidosis.  Hemoglobin A1c was found to be 12.9% suggestive of poor glycemic control in the outpatient setting.  Patient was initiated on insulin drip as well as intravenous fluids by the emergency department staff and the hospitalist group was then called to assess the patient for mission the hospital.     Review of Systems: A 10-system review of systems has been performed and all systems are  negative with the exception of what is listed in the HPI.    Past Medical History:  Diagnosis Date  . Diabetes mellitus, type II ([place]) 2015   Diagnosed during 2015 admission to [facility]  . Hemangioma of spine 2015   S/P excision at [facility] with pathology favoring benign Hemangioma    Past Surgical History:  Procedure Laterality Date  . LAMINECTOMY N/A 03/22/2014   Procedure: THORACIC two - four  LAMINECTOMY FOR TUMOR;  Surgeon: Charlie Pitter, MD;  Location: [place] NEURO ORS;  Service: Neurosurgery;  Laterality: N/A;  Throacic two  - four laminectomy for tumor  . LEG SURGERY       reports that he has never smoked. He has never used smokeless tobacco. He reports current alcohol use. He reports that he does not use drugs.  No Known Allergies  Family History  Problem Relation Age of Onset  . Breast cancer Mother      Prior to Admission medications   Medication Sig Start Date End Date Taking? Authorizing Provider  ibuprofen (ADVIL) 200 MG tablet Take 800 mg by mouth daily as needed for fever or moderate pain.   Yes [provider]  tetrahydrozoline 0.05 % ophthalmic solution Place 1 drop into both eyes daily as needed (Dry eyes and Allergies).   Yes [provider]  cyclobenzaprine (FLEXERIL) 5 MG tablet Take 1 tablet (5 mg total) by mouth 3 (three) times daily as needed for muscle spasms. Patient not taking: Reported on 09/12/2019 06/07/14   Tyler Pita, MD  metFORMIN (GLUCOPHAGE) 500 MG tablet Take 1 tablet (500 mg total) by mouth 2 (two) times daily with a meal. Patient not taking: Reported on 06/07/2014  03/27/14   Annita Brod, MD  naproxen (NAPROSYN) 500 MG tablet Take 1 tablet (500 mg total) by mouth 2 (two) times daily with a meal. Patient not taking: Reported on 06/07/2014 01/04/14   Noemi Chapel, MD  oxyCODONE (OXY IR/ROXICODONE) 5 MG immediate release tablet Take 1 tablet (5 mg total) by mouth every 4 (four) hours as needed for severe pain. Patient not  taking: Reported on 06/07/2014 03/27/14   Annita Brod, MD  sucralfate (CARAFATE) 1 G tablet Take 1 tablet (1 g total) by mouth 4 (four) times daily -  with meals and at bedtime. 5 min before eating for esophagitis Patient not taking: Reported on 06/07/2014 05/07/14   Tyler Pita, MD    Physical Exam: Vitals:   09/12/19 2211 09/13/19 0100 09/13/19 0130 09/13/19 0200  BP: (!) 150/107 (!) 130/98 111/90 122/89  Pulse: (!) 108 97 (!) 104 98  Resp: 17  20 20   Temp: 98 F (36.7 C)     TempSrc: Oral     SpO2: 100% 100% 98% 100%    Constitutional: Acute alert and oriented x3, no associated distress.  Patient is obese. Skin: no rashes, no lesions, poor skin turgor noted. Eyes: Pupils are equally reactive to light.  No evidence of scleral icterus or conjunctival pallor.  ENMT: Notable dry mucous membranes.  Posterior pharynx clear of any exudate or lesions. Normal dentition.   Neck: normal, supple, no masses, no thyromegaly Respiratory: clear to auscultation bilaterally, no wheezing, no crackles. Normal respiratory effort. No accessory muscle use.  Cardiovascular: Regular rate and rhythm, no murmurs / rubs / gallops. No extremity edema. 2+ pedal pulses. No carotid bruits.  Back:   Nontender without crepitus or deformity. Abdomen: Abdomen is soft and nontender.  No evidence of intra-abdominal masses.  Positive bowel sounds noted in all quadrants.   Musculoskeletal: No joint deformity upper and lower extremities. Good ROM, no contractures. Normal muscle tone.  Neurologic: CN 2-12 grossly intact. Sensation intact, strength noted to be 5 out of 5 in all 4 extremities.  Patient is following all commands.  Patient is responsive to verbal stimuli.   Psychiatric: Patient presents as a normal mood with appropriate affect.  Patient seems to possess insight as to theircurrent situation.     Labs on Admission: I have personally reviewed following labs and imaging studies -   CBC: Recent Labs    Lab 09/12/19 1859  WBC 6.5  HGB 13.8  HCT 42.5  MCV 70.8*  PLT 123XX123   Basic Metabolic Panel: Recent Labs  Lab 09/12/19 1859  NA 129*  K 4.7  CL 92*  CO2 25  GLUCOSE 705*  BUN 12  CREATININE 1.43*  CALCIUM 9.2   GFR: CrCl cannot be calculated (Unknown ideal weight.). Liver Function Tests: No results for input(s): AST, ALT, ALKPHOS, BILITOT, PROT, ALBUMIN in the last 168 hours. No results for input(s): LIPASE, AMYLASE in the last 168 hours. No results for input(s): AMMONIA in the last 168 hours. Coagulation Profile: No results for input(s): INR, PROTIME in the last 168 hours. Cardiac Enzymes: No results for input(s): CKTOTAL, CKMB, CKMBINDEX, TROPONINI in the last 168 hours. BNP (last 3 results) No results for input(s): PROBNP in the last 8760 hours. HbA1C: Recent Labs    09/13/19 0226  HGBA1C 12.9*   CBG: Recent Labs  Lab 09/12/19 1852 09/12/19 2302 09/13/19 0019 09/13/19 0146 09/13/19 0353  GLUCAP >600* 461* 371* 220* 158*   Lipid Profile: No results for input(s): CHOL,  HDL, LDLCALC, TRIG, CHOLHDL, LDLDIRECT in the last 72 hours. Thyroid Function Tests: No results for input(s): TSH, T4TOTAL, FREET4, T3FREE, THYROIDAB in the last 72 hours. Anemia Panel: No results for input(s): VITAMINB12, FOLATE, FERRITIN, TIBC, IRON, RETICCTPCT in the last 72 hours. Urine analysis:    Component Value Date/Time   COLORURINE STRAW (A) 09/12/2019 2212   APPEARANCEUR CLEAR 09/12/2019 2212   LABSPEC 1.028 09/12/2019 2212   PHURINE 6.0 09/12/2019 2212   GLUCOSEU >=500 (A) 09/12/2019 2212   HGBUR NEGATIVE 09/12/2019 2212   BILIRUBINUR NEGATIVE 09/12/2019 2212   KETONESUR 5 (A) 09/12/2019 2212   PROTEINUR NEGATIVE 09/12/2019 2212   UROBILINOGEN 0.2 03/23/2014 1600   NITRITE NEGATIVE 09/12/2019 2212   LEUKOCYTESUR NEGATIVE 09/12/2019 2212    Radiological Exams on Admission: No results found.   Assessment/Plan Principal Problem:   Diabetic ketoacidosis without  coma associated with type 2 diabetes mellitus ([place])  Patient exhibited extreme hyperglycemia on arrival to the emergency department with blood sugars in excess of 700, with mild anion gap of 12 and elevated beta hydroxybutyrate all concerning for mild diabetic ketoacidosis  Patient has been managed in the emergency department throughout the evening with intravenous volume resuscitation and insulin infusion  Sugars have improved substantially  Obtaining repeat chemistry, if anion gap is closed we will discontinue insulin infusion  We will then transition patient to a regimen of NovoLog 70/30 16 units twice daily, based on 0.4 units/kg.  This regimen was chosen as opposed to a basal bolus insulin regimen due to patient's desire to keep injections to a minimum to improve compliance.  We will monitor patient while hospitalized throughout the majority of the day to ensure patient continues to exhibit improved glycemic control  Providing patient with additional intravenous volume resuscitation with normal saline bolus  Once patient is ready for discharge we will arrange for new outpatient primary care provider as well as referral to outpatient diabetic education  We will arrange for patient to receive glucometer teaching during this hospitalization.  Active Problems:   Lactic acidosis    Mild lactic acidosis upon arrival to the facility secondary to volume depletion  Improving with aggressive intravenous volume resuscitation    Code Status:  Full code Family Communication: Deferred  Status is: Observation  The patient remains OBS appropriate and will d/c before 2 midnights.  Dispo: The patient is from: Home              Anticipated d/c is to: Home              Anticipated d/c date is: 1 day              Patient currently is not medically stable to d/c.        Vernelle Emerald MD [facility] Pager [phone]  If 7PM-7AM, please contact  night-coverage [email or web address] Use universal [place] password for that web site. If you do not have the password, please call the hospital operator.  09/13/2019, 5:09 AM

## 2019-09-14 DIAGNOSIS — E1110 Type 2 diabetes mellitus with ketoacidosis without coma: Secondary | ICD-10-CM | POA: Diagnosis not present

## 2019-09-14 LAB — CBC WITH DIFFERENTIAL/PLATELET
Abs Immature Granulocytes: 0.01 10*3/uL (ref 0.00–0.07)
Basophils Absolute: 0 10*3/uL (ref 0.0–0.1)
Basophils Relative: 0 %
Eosinophils Absolute: 0.2 10*3/uL (ref 0.0–0.5)
Eosinophils Relative: 3 %
HCT: 35.3 % — ABNORMAL LOW (ref 39.0–52.0)
Hemoglobin: 11.7 g/dL — ABNORMAL LOW (ref 13.0–17.0)
Immature Granulocytes: 0 %
Lymphocytes Relative: 23 %
Lymphs Abs: 1.2 10*3/uL (ref 0.7–4.0)
MCH: 23.3 pg — ABNORMAL LOW (ref 26.0–34.0)
MCHC: 33.1 g/dL (ref 30.0–36.0)
MCV: 70.2 fL — ABNORMAL LOW (ref 80.0–100.0)
Monocytes Absolute: 0.3 10*3/uL (ref 0.1–1.0)
Monocytes Relative: 6 %
Neutro Abs: 3.4 10*3/uL (ref 1.7–7.7)
Neutrophils Relative %: 68 %
Platelets: 149 10*3/uL — ABNORMAL LOW (ref 150–400)
RBC: 5.03 MIL/uL (ref 4.22–5.81)
RDW: 14.6 % (ref 11.5–15.5)
WBC: 5.1 10*3/uL (ref 4.0–10.5)
nRBC: 0 % (ref 0.0–0.2)

## 2019-09-14 LAB — COMPREHENSIVE METABOLIC PANEL
ALT: 14 U/L (ref 0–44)
AST: 17 U/L (ref 15–41)
Albumin: 2.7 g/dL — ABNORMAL LOW (ref 3.5–5.0)
Alkaline Phosphatase: 59 U/L (ref 38–126)
Anion gap: 8 (ref 5–15)
BUN: 10 mg/dL (ref 6–20)
CO2: 23 mmol/L (ref 22–32)
Calcium: 8.6 mg/dL — ABNORMAL LOW (ref 8.9–10.3)
Chloride: 103 mmol/L (ref 98–111)
Creatinine, Ser: 1.1 mg/dL (ref 0.61–1.24)
GFR calc Af Amer: >60 mL/min (ref >60)
GFR calc non Af Amer: >60 mL/min (ref >60)
Glucose, Bld: 263 mg/dL — ABNORMAL HIGH (ref 70–99)
Potassium: 3.6 mmol/L (ref 3.5–5.1)
Sodium: 134 mmol/L — ABNORMAL LOW (ref 135–145)
Total Bilirubin: 0.7 mg/dL (ref 0.3–1.2)
Total Protein: 5.9 g/dL — ABNORMAL LOW (ref 6.5–8.1)

## 2019-09-14 LAB — GLUCOSE, CAPILLARY
Glucose-Capillary: 264 mg/dL — ABNORMAL HIGH (ref 70–99)
Glucose-Capillary: 311 mg/dL — ABNORMAL HIGH (ref 70–99)
Glucose-Capillary: 296 mg/dL — ABNORMAL HIGH (ref 70–99)

## 2019-09-14 LAB — MAGNESIUM: Magnesium: 1.8 mg/dL (ref 1.7–2.4)

## 2019-09-14 MED ORDER — METFORMIN HCL 500 MG PO TABS: 500.0000 mg | ORAL_TABLET | Freq: Two times a day (BID) | ORAL | 0 refills | Status: DC

## 2019-09-14 MED ORDER — METFORMIN HCL 500 MG PO TABS
500.0000 mg | ORAL_TABLET | Freq: Two times a day (BID) | ORAL | Status: DC
  Administered 2019-09-14: 500 mg via ORAL
  Filled 2019-09-14: qty 1

## 2019-09-14 MED ORDER — INSULIN ASPART 100 UNIT/ML ~~LOC~~ SOLN: SUBCUTANEOUS | 0 refills | Status: DC

## 2019-09-14 MED ORDER — INSULIN GLARGINE 100 UNIT/ML ~~LOC~~ SOLN
45.0000 [IU] | Freq: Every day | SUBCUTANEOUS | Status: DC
  Filled 2019-09-14: qty 0.45

## 2019-09-14 MED ORDER — INSULIN ASPART PROT & ASPART (70-30 MIX) 100 UNIT/ML ~~LOC~~ SUSP
25.0000 [IU] | Freq: Two times a day (BID) | SUBCUTANEOUS | Status: DC
  Filled 2019-09-14: qty 10

## 2019-09-14 MED ORDER — INSULIN GLARGINE 100 UNIT/ML ~~LOC~~ SOLN: 45.0000 [IU] | Freq: Every day | SUBCUTANEOUS | 0 refills | Status: DC

## 2019-09-14 MED ORDER — INSULIN LISPRO 100 UNIT/ML ~~LOC~~ SOLN: SUBCUTANEOUS | 3 refills | Status: DC

## 2019-09-14 MED ORDER — INSULIN ASPART PROT & ASPART (70-30 MIX) 100 UNIT/ML ~~LOC~~ SUSP: 30.0000 [IU] | Freq: Two times a day (BID) | SUBCUTANEOUS | 0 refills | Status: DC

## 2019-09-14 MED ORDER — BLOOD GLUCOSE MONITOR KIT: PACK | 1 refills | Status: DC

## 2019-09-14 MED ORDER — "INSULIN SYRINGE-NEEDLE U-100 25G X 1"" 1 ML MISC": 0 refills | Status: DC

## 2019-09-14 NOTE — Plan of Care (Signed)
  Problem: Education: Goal: Knowledge of General Education information will improve Description Including pain rating scale, medication(s)/side effects and non-pharmacologic comfort measures Outcome: Progressing   

## 2019-09-14 NOTE — Discharge Summary (Addendum)
Gary Conway [id number] DOB: [birth year] DOA: 09/12/2019  PCP: Elizabeth Palau, MD (Inactive)  Admit date: 09/12/2019  Discharge date: 09/14/2019  Admitted From: Home  Disposition:  Home   Recommendations for Outpatient Follow-up:   Follow up with PCP in 1-2 weeks  PCP Please obtain BMP/CBC, 2 view CXR in 1week,  (see Discharge instructions)   PCP Please follow up on the following pending results: Monitor [place]: None   Equipment/Devices: None  Consultations: None  Discharge Condition: Stable    CODE STATUS: Full  Diet Recommendation: Heart Healthy Low Carb    Chief Complaint  Patient presents with   Urinary Frequency   Blurred Vision   Polydipsia     Brief history of present illness from the day of admission and additional interim summary    44 year old male with past medical history of diabetes mellitus type 2 diagnosed in 2015 and previous benign hemangioma of the spine who presents to [facility] emergency department with a 1 week history of progressive polydipsia and polyuria with blurred vision, was diagnosed with early DKA and admitted.                                                                 Hospital Course    DM type II with early DKA.  Was severely dehydrated with sugars above 700, anion gap which was borderline but elevated beta hydroxybutyrate, he was treated with DKA protocol.  Gap has closed and he has been transitioned to subcu insulin, currently at baseline, receiving diabetic and insulin education.  Of note patient was prescribed Glucophage in 2016 by one of the outpatient physicians however he claims he was never told he had diabetes.  Case manager and pharmacist trying to come up with cheapest possible outpatient solution, he will be placed on  Glucophage along with 7030 and sliding scale insulin if he can afford, testing supplies provided.  Mother updated.  Patient is now symptom-free will be discharged home with outpatient PCP follow-up.   Discharge diagnosis     Principal Problem:   Diabetic ketoacidosis without coma associated with type 2 diabetes mellitus ([place]) Active Problems:   Lactic acidosis    Discharge instructions    Discharge Instructions    Discharge instructions   Complete by: As directed    Follow with Primary MD Kennon Holter Denton Meek, MD (Inactive) in 7 days   Get CBC, CMPchecked next visit within 1 week by Primary MD   Activity: As tolerated with Full fall precautions use walker/cane & assistance as needed  Disposition Home   Diet: Heart Healthy Low Carb  Accuchecks 4 times/day, Once in AM empty stomach and then before each meal. Log in all results and show them to your Prim.MD in 3 days. If any glucose reading  is under 80 or above 300 call your Prim MD immidiately. Follow Low glucose instructions for glucose under 80 as instructed.  Special Instructions: If you have smoked or chewed Tobacco  in the last 2 yrs please stop smoking, stop any regular Alcohol  and or any Recreational drug use.  On your next visit with your primary care physician please Get Medicines reviewed and adjusted.  Please request your Prim.MD to go over all Hospital Tests and Procedure/Radiological results at the follow up, please get all Hospital records sent to your Prim MD by signing hospital release before you go home.  If you experience worsening of your admission symptoms, develop shortness of breath, life threatening emergency, suicidal or homicidal thoughts you must seek medical attention immediately by calling 911 or calling your MD immediately  if symptoms less severe.  You Must read complete instructions/literature along with all the possible adverse reactions/side effects for all the Medicines you take and that have  been prescribed to you. Take any new Medicines after you have completely understood and accpet all the possible adverse reactions/side effects.   Do not drive, operate heavy machinery, perform activities at heights, swimming or participation in water activities or provide baby sitting services if your were admitted for syncope or siezures until you have seen by Primary MD or a Neurologist and advised to do so again.   Increase activity slowly   Complete by: As directed       Discharge Medications   Allergies as of 09/14/2019   No Known Allergies     Medication List    STOP taking these medications   cyclobenzaprine 5 MG tablet Commonly known as: FLEXERIL   ibuprofen 200 MG tablet Commonly known as: ADVIL   naproxen 500 MG tablet Commonly known as: Naprosyn   oxyCODONE 5 MG immediate release tablet Commonly known as: Oxy IR/ROXICODONE   sucralfate 1 g tablet Commonly known as: Carafate   tetrahydrozoline 0.05 % ophthalmic solution     TAKE these medications   blood glucose meter kit and supplies Kit Dispense based on patient and insurance preference. Use up to four times daily as directed. (FOR ICD-9 250.00, 250.01). For QAC - HS accuchecks.   insulin glargine 100 UNIT/ML injection Commonly known as: Lantus Inject 0.45 mLs (45 Units total) into the skin at bedtime. Dispense insulin pen if approved, if not dispense as needed syringes and needles for 1 month supply. Can switch to Levemir. Diagnosis E 11.65.   insulin lispro 100 UNIT/ML injection Commonly known as: HUMALOG Substitute to any brand approved like Novalog .Before each meal 3 times a day, 140-199 - 2 units, 200-250 - 4 units, 251-299 - 6 units,  300-349 - 8 units,  350 or above 10 units. Dispense syringes and needles as needed, Ok to switch to PEN if approved. DX DM2, Code E11.65   Insulin Syringe-Needle U-100 25G X 1" 1 ML Misc For 4 times a day insulin SQ, 1 month supply. Diagnosis E11.65   metFORMIN 500 MG  tablet Commonly known as: GLUCOPHAGE Take 1 tablet (500 mg total) by mouth 2 (two) times daily with a meal.       Follow-up Information    Ladell Pier, MD. Go on 10/15/2019.   Specialty: Internal Medicine Why: Your appointment is at 2:30pm, call with any questions and please wear a mask. Contact information: [place] [address] [phone]           Major procedures and Radiology Reports -  PLEASE review detailed and final reports thoroughly  -        No results found.  Micro Results     Recent Results (from the past 240 hour(s))  SARS CORONAVIRUS 2 (TAT 6-24 HRS) Nasopharyngeal Nasopharyngeal Swab     Status: None   Collection Time: 09/13/19  3:55 AM   Specimen: Nasopharyngeal Swab  Result Value Ref Range Status   SARS Coronavirus 2 NEGATIVE NEGATIVE Final    Comment: (NOTE) SARS-CoV-2 target nucleic acids are NOT DETECTED. The SARS-CoV-2 RNA is generally detectable in upper and lower respiratory specimens during the acute phase of infection. Negative results do not preclude SARS-CoV-2 infection, do not rule out co-infections with other pathogens, and should not be used as the sole basis for treatment or other patient management decisions. Negative results must be combined with clinical observations, patient history, and epidemiological information. The expected result is Negative. Fact Sheet for Patients: SugarRoll.be Fact Sheet for Healthcare Providers: https://www.woods-mathews.com/ This test is not yet approved or cleared by the [place] FDA and  has been authorized for detection and/or diagnosis of SARS-CoV-2 by FDA under an Emergency Use Authorization (EUA). This EUA will remain  in effect (meaning this test can be used) for the duration of the COVID-19 declaration under Section 56 4(b)(1) of the Act, 21 U.S.C. section 360bbb-3(b)(1), unless the authorization is terminated or revoked  sooner. Performed at [facility], [place] [address], [place], [place] [address]     Today   Subjective    Gary Conway today has no headache,no chest abdominal pain,no new weakness tingling or numbness, feels much better wants to go home today.     Objective   Blood pressure 130/86, pulse 99, temperature 98.1 F (36.7 C), temperature source Oral, resp. rate 18, SpO2 100 %.   Intake/Output Summary (Last 24 hours) at 09/14/2019 1242 Last data filed at 09/14/2019 1106 Gross per 24 hour  Intake 1151.81 ml  Output --  Net 1151.81 ml    Exam Awake Alert, Oriented x 3, No new F.N deficits, Normal affect [place].AT,PERRAL Supple Neck,No JVD, No cervical lymphadenopathy appriciated.  Symmetrical Chest wall movement, Good air movement bilaterally, CTAB RRR,No Gallops,Rubs or new Murmurs, No Parasternal Heave +ve B.Sounds, Abd Soft, Non tender, No organomegaly appriciated, No rebound -guarding or rigidity. No Cyanosis, Clubbing or edema, No new Rash or bruise   Data Review   CBC w Diff:  Lab Results  Component Value Date   WBC 5.1 09/14/2019   HGB 11.7 (L) 09/14/2019   HCT 35.3 (L) 09/14/2019   PLT 149 (L) 09/14/2019   LYMPHOPCT 23 09/14/2019   MONOPCT 6 09/14/2019   EOSPCT 3 09/14/2019   BASOPCT 0 09/14/2019    CMP:  Lab Results  Component Value Date   NA 134 (L) 09/14/2019   K 3.6 09/14/2019   CL 103 09/14/2019   CO2 23 09/14/2019   BUN 10 09/14/2019   CREATININE 1.10 09/14/2019   PROT 5.9 (L) 09/14/2019   ALBUMIN 2.7 (L) 09/14/2019   BILITOT 0.7 09/14/2019   ALKPHOS 59 09/14/2019   AST 17 09/14/2019   ALT 14 09/14/2019  .   Total Time in preparing paper work, data evaluation and todays exam - 55 minutes  Lala Lund M.D on 09/14/2019 at 12:42 PM  [facility]   Office  [phone]

## 2019-09-14 NOTE — TOC Benefit Eligibility Note (Signed)
Transition of Care [facility]) Benefit Eligibility Note    Patient Details  Name: Mischa Broberg MRN: [id number] Date of Birth: 1975/06/01   Medication/Dose: HUMALOG 100 UNIT / ML  QTL 10 ML  Covered?: Yes     Prescription Coverage Preferred Pharmacy: [facility] Faster , [facility]  Spoke with Person/Company/Phone Number:: JARED   @  [employer] [id number] # [phone]  Co-Pay: Johnsie Kindred  Prior Approval: No  Deductible: Unmet  Additional Notes: NOVOLOG    70/30  and   NOVOLOG  100 UNIT / ML : NON-FORMULARY / EXCLUSION , P/A  YES # [id number]    Memory Argue Phone Number: 09/14/2019, 12:15 PM

## 2019-09-14 NOTE — Progress Notes (Signed)
Called Diabetes Educator to make sure pt is on the radar to be seen today.  Pt called mom to make sure she can be present for the education as well.  Pt is anxious about using needles and states, "I dont know about having to use a needle."

## 2019-09-14 NOTE — Progress Notes (Addendum)
Inpatient Diabetes Program Recommendations  AACE/ADA: New Consensus Statement on Inpatient Glycemic Control (2015)  Target Ranges:  Prepandial:   less than 140 mg/dL      Peak postprandial:   less than 180 mg/dL (1-2 hours)      Critically ill patients:  140 - 180 mg/dL   Lab Results  Component Value Date   GLUCAP 311 (H) 09/14/2019   HGBA1C 12.9 (H) 09/13/2019    Review of Glycemic Control  Diabetes history: New Diabetes Diagnosis  Pt has insurance that is out of network with our [employer]. Pt will need Rx from [facility]. Will attach [employer] product price list to AVS  Pt with insurance however has an appointment with [facility] on 10/15/19 at 2:30 pm.  Pt desires insulin pen  NOVOLIN 70/30 insulin pen 30 units bid (equivalent dose of Lantus 45 units Dr. Candiss Norse prescribed on AVS) ($43 for box of 5) Insulin pen needles Glucose meter kit order # [id number]  Pt [facility] get Dm [facility]  Pt reports new diagnosis. Was on metformin in the past while on steroids but this has been over 6 years ago. Spoke with patient about new diabetes diagnosis.  Discussed A1C results (12.9% this admission) and explained what an A1C is. Discussed basic pathophysiology of DM Type 2, basic home care, importance of checking CBGs and maintaining good CBG control to prevent long-term and short-term complications. Reviewed glucose and A1C goals.  Reviewed signs and symptoms of hyperglycemia and hypoglycemia along with treatment for both. Discussed impact of nutrition, exercise, stress, sickness, and medications on diabetes control. Discussed lifestyle modifications in detail regarding diet changes and exercise.  Informed patient that he will be prescribed Novolin 70/30 since it is more affordable. Informed patient that Novolin 70/30 can be purchased at [facility] for $43 per box of insulin pens. Provided patient with handout information on Reli-On products. Discussed 70/30 insulin in detail (how to take it, when to take  it) and instructed patient he would begin taking it today with supper. Asked patient to check his glucose 2-4 times per day (before meals and at bedtime) and to keep a log book of glucose readings and insulin taken. Explained how the doctor he follows up with can use the log book to continue to make insulin adjustments if needed. Reviewed and demonstrated how to operate insulin pen. RNs to provide ongoing basic DM education at bedside with this patient and engage patient to actively check blood glucose and administer insulin injections.   Thanks, Tama Headings RN, MSN, BC-ADM Inpatient Diabetes Coordinator Team Pager [phone] (8a-5p)

## 2019-09-14 NOTE — Care Management (Addendum)
Entered benefits check for :   Novolog 70/30 inject 0.3 ml BID Qty 10 ml    Novolog 100 unit/ml injection QTY 10 ML     Humalog 100 unit / ml QTY 10 ml   Patient does not have a PCP, will make follow up appointment.   Discussed with patient . Patient voices understanding. His mother is on way to hospital.   Thanks    Magdalen Spatz RN

## 2019-09-14 NOTE — Discharge Instructions (Signed)
Follow with Primary MD Kennon Holter, Denton Meek, MD (Inactive) in 7 days   Get CBC, CMPchecked next visit within 1 week by Primary MD   Activity: As tolerated with Full fall precautions use walker/cane & assistance as needed  Disposition Home   Diet: Heart Healthy Low Carb  Accuchecks 4 times/day, Once in AM empty stomach and then before each meal. Log in all results and show them to your Prim.MD in 3 days. If any glucose reading is under 80 or above 300 call your Prim MD immidiately. Follow Low glucose instructions for glucose under 80 as instructed.  Special Instructions: If you have smoked or chewed Tobacco  in the last 2 yrs please stop smoking, stop any regular Alcohol  and or any Recreational drug use.  On your next visit with your primary care physician please Get Medicines reviewed and adjusted.  Please request your Prim.MD to go over all Hospital Tests and Procedure/Radiological results at the follow up, please get all Hospital records sent to your Prim MD by signing hospital release before you go home.  If you experience worsening of your admission symptoms, develop shortness of breath, life threatening emergency, suicidal or homicidal thoughts you must seek medical attention immediately by calling 911 or calling your MD immediately  if symptoms less severe.  You Must read complete instructions/literature along with all the possible adverse reactions/side effects for all the Medicines you take and that have been prescribed to you. Take any new Medicines after you have completely understood and accpet all the possible adverse reactions/side effects.   Do not drive, operate heavy machinery, perform activities at heights, swimming or participation in water activities or provide baby sitting services if your were admitted for syncope or siezures until you have seen by Primary MD or a Neurologist and advised to do so again.  Glucose Products:  ReliOnT glucose products raise low blood  sugar fast. Tablets are free of fat, caffeine, sodium and gluten. They are portable and easy to carry, making it easier for people with diabetes to BE PREPARED for lows.  Glucose Tablets Available in 6 flavors . 10 ct...................................... $1.00 . 50 ct...................................... $3.98 Glucose Shot..................................$1.48 Glucose Gel....................................$3.44  Alcohol Swabs Alcohol swabs are used to sterilize your injection site. All of our swabs are individually wrapped for maximum safety, convenience and moisture retention. ReliOnT Alcohol Swabs . 100 ct Swabs..............................$1.00 . 400 ct Swabs..............................$3.74  Lancets ReliOnT offers three lancet options conveniently designed to work with almost every lancing device. Each features a protective disk, which guarantees sterility before testing. ReliOnT Lancets . 100 ct Lancets $1.56 . 200 ct Lancets $2.64 Available in Ultra-Thin, Thin & Micro-Thin ReliOnT 2-IN-1 Lancing Device . 50 ct Lancets..................................... $3.44 Available in 30 gauge and 25 gauge ReliOnT Lancing Device....................$5.84  Blood Glucose Monitors ReliOnT offers a full range of blood glucose testing options to provide an accurate, affordable system that meets each person's unique needs and preferences. Prime Meter....................................... $9.00 Prime Test Strips . 25 test strips.................................... $5.00 . 50 test strips.................................... $9.00 . 100 test strips.................................$17.88 Premier BLU Meter  ............  $18.98 Premier Voice Meter  .............  $14.98 Premier Test Strips . 50 test strips.................................... $9.00 . 100 test strips.................................$17.88 Premier [employer]  ............  $19.44 Kit includes: . 50 test strips . 10  lancets . Lancing device . Carry case  Ketone Test Strips . 50 test strips  ................  $6.64  Human Insulin  Novolin/ReliOnT (recombinant DNA origin) is manufactured for [employer] by [employer]  Insulin* with Vial..........$24.88 Available in N, R, 70/30 Novolin/ReliOnT Insulin Pens*  .....  $42.88 Available in N, R, 70/30  Insulin Delivery ReliOnT syringes and pen needles provide precision technology, comfort and accuracy in insulin delivery at affordable prices. ReliOnT Pen Needles* . 50 ct....................................................$9.00 Available in 21m, 651m 57m76m 52m24mliOnT Insulin Syringes* . 100 ct ............ $12.58 Available in 29G, 30G & 31G (3/10cc, 1/2cc & 1cc units)

## 2019-09-14 NOTE — Progress Notes (Signed)
Pt discharged to home.  Reviewed all DC instructions and Rxs with pt. Pt is going to [employer] post DC to get supplies and insulins.  Pt instructed to check CBGs 4 times a day and to record CBGs and amounts of SS insulin administered.  Reviewed informational sheets on hypo and hyper glycemia.  Instructed pt to ask for pen at [facility] as this is his preference.  Pt's mother understands how to administer insulin with the pen so she will be a valuable resource as well as for food selection.  Follow up appointments reviewed with pt.

## 2019-09-14 NOTE — Social Work (Addendum)
Appointment made with Karle Plumber, MD at [facility] and [facility]. Your appointment is at 2:30pm on May 20th, call with any questions and please wear a mask.   Westley Hummer, MSW, [place] Work

## 2019-09-14 NOTE — Progress Notes (Signed)
Mom came to pt's room.  She will be an excellent resource to the pt. She had been on an insulin pen but with weight loss and exercise, she was able to get off all diabetic meds. Pt appears unengaged and overwhelmed. Will educate on checking CBG with the next on done.

## 2019-10-15 ENCOUNTER — Ambulatory Visit: Payer: 59 | Admitting: Internal Medicine

## 2019-10-30 MED FILL — METFORMIN HCL 500 MG TABS: 500 | 30 days supply | Qty: 60 | Fill #0

## 2019-11-04 ENCOUNTER — Inpatient Hospital Stay: Payer: 59

## 2023-03-22 ENCOUNTER — Emergency Department (HOSPITAL_COMMUNITY): Payer: Self-pay

## 2023-03-22 ENCOUNTER — Other Ambulatory Visit: Payer: Self-pay

## 2023-03-22 ENCOUNTER — Encounter (HOSPITAL_COMMUNITY): Payer: Self-pay

## 2023-03-22 ENCOUNTER — Inpatient Hospital Stay (HOSPITAL_COMMUNITY)
Admission: EM | Admit: 2023-03-22 | Discharge: 2023-04-08 | DRG: 871 | Disposition: A | Discharge status: Home / Self Care | Payer: Self-pay | Attending: Internal Medicine | Admitting: Internal Medicine

## 2023-03-22 DIAGNOSIS — R700 Elevated erythrocyte sedimentation rate: Secondary | ICD-10-CM | POA: Diagnosis present

## 2023-03-22 DIAGNOSIS — J189 Pneumonia, unspecified organism: Principal | ICD-10-CM | POA: Insufficient documentation

## 2023-03-22 DIAGNOSIS — R6520 Severe sepsis without septic shock: Secondary | ICD-10-CM

## 2023-03-22 DIAGNOSIS — R509 Fever, unspecified: Secondary | ICD-10-CM

## 2023-03-22 DIAGNOSIS — Z794 Long term (current) use of insulin: Secondary | ICD-10-CM

## 2023-03-22 DIAGNOSIS — A419 Sepsis, unspecified organism: Principal | ICD-10-CM | POA: Diagnosis present

## 2023-03-22 DIAGNOSIS — R042 Hemoptysis: Secondary | ICD-10-CM

## 2023-03-22 DIAGNOSIS — I5033 Acute on chronic diastolic (congestive) heart failure: Secondary | ICD-10-CM | POA: Diagnosis present

## 2023-03-22 DIAGNOSIS — E669 Obesity, unspecified: Secondary | ICD-10-CM | POA: Diagnosis present

## 2023-03-22 DIAGNOSIS — R3129 Other microscopic hematuria: Secondary | ICD-10-CM | POA: Diagnosis present

## 2023-03-22 DIAGNOSIS — R7982 Elevated C-reactive protein (CRP): Secondary | ICD-10-CM | POA: Diagnosis present

## 2023-03-22 DIAGNOSIS — T380X5A Adverse effect of glucocorticoids and synthetic analogues, initial encounter: Secondary | ICD-10-CM | POA: Diagnosis present

## 2023-03-22 DIAGNOSIS — I132 Hypertensive heart and chronic kidney disease with heart failure and with stage 5 chronic kidney disease, or end stage renal disease: Secondary | ICD-10-CM | POA: Diagnosis present

## 2023-03-22 DIAGNOSIS — D509 Iron deficiency anemia, unspecified: Secondary | ICD-10-CM | POA: Diagnosis present

## 2023-03-22 DIAGNOSIS — E1165 Type 2 diabetes mellitus with hyperglycemia: Secondary | ICD-10-CM

## 2023-03-22 DIAGNOSIS — Z803 Family history of malignant neoplasm of breast: Secondary | ICD-10-CM

## 2023-03-22 DIAGNOSIS — Z1152 Encounter for screening for COVID-19: Secondary | ICD-10-CM

## 2023-03-22 DIAGNOSIS — N179 Acute kidney failure, unspecified: Secondary | ICD-10-CM | POA: Diagnosis present

## 2023-03-22 DIAGNOSIS — E1110 Type 2 diabetes mellitus with ketoacidosis without coma: Secondary | ICD-10-CM | POA: Diagnosis present

## 2023-03-22 DIAGNOSIS — J440 Chronic obstructive pulmonary disease with acute lower respiratory infection: Secondary | ICD-10-CM | POA: Diagnosis present

## 2023-03-22 DIAGNOSIS — G8220 Paraplegia, unspecified: Secondary | ICD-10-CM | POA: Diagnosis present

## 2023-03-22 DIAGNOSIS — E119 Type 2 diabetes mellitus without complications: Secondary | ICD-10-CM

## 2023-03-22 DIAGNOSIS — D334 Benign neoplasm of spinal cord: Secondary | ICD-10-CM | POA: Diagnosis present

## 2023-03-22 DIAGNOSIS — E876 Hypokalemia: Secondary | ICD-10-CM | POA: Diagnosis present

## 2023-03-22 DIAGNOSIS — Z7984 Long term (current) use of oral hypoglycemic drugs: Secondary | ICD-10-CM

## 2023-03-22 DIAGNOSIS — D72819 Decreased white blood cell count, unspecified: Secondary | ICD-10-CM | POA: Diagnosis present

## 2023-03-22 DIAGNOSIS — J9601 Acute respiratory failure with hypoxia: Principal | ICD-10-CM

## 2023-03-22 DIAGNOSIS — Z6829 Body mass index (BMI) 29.0-29.9, adult: Secondary | ICD-10-CM

## 2023-03-22 DIAGNOSIS — E871 Hypo-osmolality and hyponatremia: Secondary | ICD-10-CM | POA: Diagnosis present

## 2023-03-22 DIAGNOSIS — N185 Chronic kidney disease, stage 5: Secondary | ICD-10-CM | POA: Diagnosis present

## 2023-03-22 DIAGNOSIS — E1122 Type 2 diabetes mellitus with diabetic chronic kidney disease: Secondary | ICD-10-CM | POA: Diagnosis present

## 2023-03-22 DIAGNOSIS — J80 Acute respiratory distress syndrome: Secondary | ICD-10-CM | POA: Diagnosis present

## 2023-03-22 DIAGNOSIS — E1169 Type 2 diabetes mellitus with other specified complication: Secondary | ICD-10-CM

## 2023-03-22 DIAGNOSIS — Z79899 Other long term (current) drug therapy: Secondary | ICD-10-CM

## 2023-03-22 DIAGNOSIS — R0489 Hemorrhage from other sites in respiratory passages: Secondary | ICD-10-CM

## 2023-03-22 HISTORY — DX: Pneumonia, unspecified organism: J18.9

## 2023-03-22 HISTORY — DX: Sepsis, unspecified organism: R65.20

## 2023-03-22 LAB — I-STAT VENOUS BLOOD GAS, ED
Acid-base deficit: 9 mmol/L — ABNORMAL HIGH (ref 0.0–2.0)
Acid-base deficit: 7 mmol/L — ABNORMAL HIGH (ref 0.0–2.0)
Bicarbonate: 15.5 mmol/L — ABNORMAL LOW (ref 20.0–28.0)
Bicarbonate: 17.3 mmol/L — ABNORMAL LOW (ref 20.0–28.0)
Calcium, Ion: 0.97 mmol/L — ABNORMAL LOW (ref 1.15–1.40)
Calcium, Ion: 0.98 mmol/L — ABNORMAL LOW (ref 1.15–1.40)
HCT: 37 % — ABNORMAL LOW (ref 39.0–52.0)
HCT: 31 % — ABNORMAL LOW (ref 39.0–52.0)
Hemoglobin: 12.6 g/dL — ABNORMAL LOW (ref 13.0–17.0)
Hemoglobin: 10.5 g/dL — ABNORMAL LOW (ref 13.0–17.0)
O2 Saturation: 83 %
O2 Saturation: 62 %
Potassium: 4.1 mmol/L (ref 3.5–5.1)
Potassium: 4.2 mmol/L (ref 3.5–5.1)
Sodium: 125 mmol/L — ABNORMAL LOW (ref 135–145)
Sodium: 126 mmol/L — ABNORMAL LOW (ref 135–145)
TCO2: 16 mmol/L — ABNORMAL LOW (ref 22–32)
TCO2: 18 mmol/L — ABNORMAL LOW (ref 22–32)
pCO2, Ven: 27.9 mm[Hg] — ABNORMAL LOW (ref 44–60)
pCO2, Ven: 31.6 mm[Hg] — ABNORMAL LOW (ref 44–60)
pH, Ven: 7.351 (ref 7.25–7.43)
pH, Ven: 7.347 (ref 7.25–7.43)
pO2, Ven: 49 mm[Hg] — ABNORMAL HIGH (ref 32–45)
pO2, Ven: 33 mm[Hg] (ref 32–45)

## 2023-03-22 LAB — GLUCOSE, CAPILLARY
Glucose-Capillary: 218 mg/dL — ABNORMAL HIGH (ref 70–99)
Glucose-Capillary: 200 mg/dL — ABNORMAL HIGH (ref 70–99)
Glucose-Capillary: 176 mg/dL — ABNORMAL HIGH (ref 70–99)

## 2023-03-22 LAB — BASIC METABOLIC PANEL
Anion gap: 18 — ABNORMAL HIGH (ref 5–15)
Anion gap: 12 (ref 5–15)
BUN: 72 mg/dL — ABNORMAL HIGH (ref 6–20)
BUN: 69 mg/dL — ABNORMAL HIGH (ref 6–20)
CO2: 18 mmol/L — ABNORMAL LOW (ref 22–32)
CO2: 19 mmol/L — ABNORMAL LOW (ref 22–32)
Calcium: 8 mg/dL — ABNORMAL LOW (ref 8.9–10.3)
Calcium: 7.4 mg/dL — ABNORMAL LOW (ref 8.9–10.3)
Chloride: 91 mmol/L — ABNORMAL LOW (ref 98–111)
Chloride: 98 mmol/L (ref 98–111)
Creatinine, Ser: 5.69 mg/dL — ABNORMAL HIGH (ref 0.61–1.24)
Creatinine, Ser: 5.42 mg/dL — ABNORMAL HIGH (ref 0.61–1.24)
GFR, Estimated: 11 mL/min — ABNORMAL LOW (ref >60)
GFR, Estimated: 12 mL/min — ABNORMAL LOW (ref >60)
Glucose, Bld: 441 mg/dL — ABNORMAL HIGH (ref 70–99)
Glucose, Bld: 235 mg/dL — ABNORMAL HIGH (ref 70–99)
Potassium: 4.3 mmol/L (ref 3.5–5.1)
Potassium: 3.8 mmol/L (ref 3.5–5.1)
Sodium: 127 mmol/L — ABNORMAL LOW (ref 135–145)
Sodium: 129 mmol/L — ABNORMAL LOW (ref 135–145)

## 2023-03-22 LAB — COMPREHENSIVE METABOLIC PANEL
ALT: 33 U/L (ref 0–44)
AST: 62 U/L — ABNORMAL HIGH (ref 15–41)
Albumin: 2.4 g/dL — ABNORMAL LOW (ref 3.5–5.0)
Alkaline Phosphatase: 85 U/L (ref 38–126)
Anion gap: 24 — ABNORMAL HIGH (ref 5–15)
BUN: 68 mg/dL — ABNORMAL HIGH (ref 6–20)
CO2: 14 mmol/L — ABNORMAL LOW (ref 22–32)
Calcium: 8.4 mg/dL — ABNORMAL LOW (ref 8.9–10.3)
Chloride: 88 mmol/L — ABNORMAL LOW (ref 98–111)
Creatinine, Ser: 5.65 mg/dL — ABNORMAL HIGH (ref 0.61–1.24)
GFR, Estimated: 12 mL/min — ABNORMAL LOW (ref >60)
Glucose, Bld: 474 mg/dL — ABNORMAL HIGH (ref 70–99)
Potassium: 4.1 mmol/L (ref 3.5–5.1)
Sodium: 126 mmol/L — ABNORMAL LOW (ref 135–145)
Total Bilirubin: 1.8 mg/dL — ABNORMAL HIGH (ref 0.3–1.2)
Total Protein: 7.7 g/dL (ref 6.5–8.1)

## 2023-03-22 LAB — CBC WITH DIFFERENTIAL/PLATELET
Abs Immature Granulocytes: 1.02 10*3/uL — ABNORMAL HIGH (ref 0.00–0.07)
Basophils Absolute: 0.1 10*3/uL (ref 0.0–0.1)
Basophils Relative: 0 %
Eosinophils Absolute: 0 10*3/uL (ref 0.0–0.5)
Eosinophils Relative: 0 %
HCT: 33.7 % — ABNORMAL LOW (ref 39.0–52.0)
Hemoglobin: 10.9 g/dL — ABNORMAL LOW (ref 13.0–17.0)
Immature Granulocytes: 4 %
Lymphocytes Relative: 1 %
Lymphs Abs: 0.3 10*3/uL — ABNORMAL LOW (ref 0.7–4.0)
MCH: 21.8 pg — ABNORMAL LOW (ref 26.0–34.0)
MCHC: 32.3 g/dL (ref 30.0–36.0)
MCV: 67.5 fL — ABNORMAL LOW (ref 80.0–100.0)
Monocytes Absolute: 0.3 10*3/uL (ref 0.1–1.0)
Monocytes Relative: 1 %
Neutro Abs: 22.7 10*3/uL — ABNORMAL HIGH (ref 1.7–7.7)
Neutrophils Relative %: 94 %
Platelets: 187 10*3/uL (ref 150–400)
RBC: 4.99 MIL/uL (ref 4.22–5.81)
RDW: 15.3 % (ref 11.5–15.5)
Smear Review: NORMAL
WBC: 24.5 10*3/uL — ABNORMAL HIGH (ref 4.0–10.5)
nRBC: 0 % (ref 0.0–0.2)

## 2023-03-22 LAB — I-STAT CG4 LACTIC ACID, ED: Lactic Acid, Venous: 4.4 mmol/L (ref 0.5–1.9)

## 2023-03-22 LAB — URINALYSIS, W/ REFLEX TO CULTURE (INFECTION SUSPECTED)
Bilirubin Urine: NEGATIVE
Glucose, UA: >=500 mg/dL — AB
Ketones, ur: 5 mg/dL — AB
Leukocytes,Ua: NEGATIVE
Nitrite: NEGATIVE
Protein, ur: 30 mg/dL — AB
Specific Gravity, Urine: 1.011 (ref 1.005–1.030)
pH: 5 (ref 5.0–8.0)

## 2023-03-22 LAB — CBG MONITORING, ED
Glucose-Capillary: 415 mg/dL — ABNORMAL HIGH (ref 70–99)
Glucose-Capillary: 478 mg/dL — ABNORMAL HIGH (ref 70–99)
Glucose-Capillary: 439 mg/dL — ABNORMAL HIGH (ref 70–99)
Glucose-Capillary: 413 mg/dL — ABNORMAL HIGH (ref 70–99)
Glucose-Capillary: 336 mg/dL — ABNORMAL HIGH (ref 70–99)

## 2023-03-22 LAB — RESP PANEL BY RT-PCR (RSV, FLU A&B, COVID)  RVPGX2
Influenza A by PCR: NEGATIVE
Influenza B by PCR: NEGATIVE
Resp Syncytial Virus by PCR: NEGATIVE
SARS Coronavirus 2 by RT PCR: NEGATIVE

## 2023-03-22 LAB — BETA-HYDROXYBUTYRIC ACID
Beta-Hydroxybutyric Acid: 1.78 mmol/L — ABNORMAL HIGH (ref 0.05–0.27)
Beta-Hydroxybutyric Acid: 0.05 mmol/L (ref 0.05–0.27)

## 2023-03-22 LAB — HIV ANTIBODY (ROUTINE TESTING W REFLEX): HIV Screen 4th Generation wRfx: NONREACTIVE

## 2023-03-22 LAB — HEMOGLOBIN A1C
Hgb A1c MFr Bld: 8.1 % — ABNORMAL HIGH (ref 4.8–5.6)
Mean Plasma Glucose: 185.77 mg/dL

## 2023-03-22 LAB — MAGNESIUM: Magnesium: 2.4 mg/dL (ref 1.7–2.4)

## 2023-03-22 MED ORDER — ACETAMINOPHEN 325 MG PO TABS
975.0000 mg | ORAL_TABLET | Freq: Once | ORAL | Status: AC
  Administered 2023-03-22: 975 mg via ORAL
  Filled 2023-03-22: qty 3

## 2023-03-22 MED ORDER — SODIUM CHLORIDE 0.9 % IV SOLN
2.0000 g | INTRAVENOUS | Status: DC
  Administered 2023-03-23 – 2023-03-24 (×2): 2 g via INTRAVENOUS
  Filled 2023-03-22 (×2): qty 20

## 2023-03-22 MED ORDER — DEXTROSE IN LACTATED RINGERS 5 % IV SOLN
INTRAVENOUS | Status: AC
Start: 2023-03-22 — End: 2023-03-23

## 2023-03-22 MED ORDER — ACETAMINOPHEN 325 MG PO TABS
650.0000 mg | ORAL_TABLET | Freq: Four times a day (QID) | ORAL | Status: DC | PRN
  Administered 2023-03-22 – 2023-03-23 (×2): 650 mg via ORAL
  Filled 2023-03-22 (×2): qty 2

## 2023-03-22 MED ORDER — LACTATED RINGERS IV SOLN: 150.0000 mL/h | INTRAVENOUS | Status: AC

## 2023-03-22 MED ORDER — LACTATED RINGERS IV SOLN: INTRAVENOUS | Status: AC

## 2023-03-22 MED ORDER — ENOXAPARIN SODIUM 30 MG/0.3ML IJ SOSY: 30.0000 mg | PREFILLED_SYRINGE | INTRAMUSCULAR | Status: DC

## 2023-03-22 MED ORDER — LACTATED RINGERS IV BOLUS (SEPSIS)
1000.0000 mL | Freq: Once | INTRAVENOUS | Status: AC
  Administered 2023-03-22: 1000 mL via INTRAVENOUS

## 2023-03-22 MED ORDER — ACETAMINOPHEN 650 MG RE SUPP: 650.0000 mg | Freq: Four times a day (QID) | RECTAL | Status: DC | PRN

## 2023-03-22 MED ORDER — LACTATED RINGERS IV BOLUS
1000.0000 mL | Freq: Once | INTRAVENOUS | Status: AC
  Administered 2023-03-22: 1000 mL via INTRAVENOUS

## 2023-03-22 MED ORDER — SODIUM CHLORIDE 0.9 % IV BOLUS
1000.0000 mL | Freq: Once | INTRAVENOUS | Status: AC
  Administered 2023-03-22: 1000 mL via INTRAVENOUS

## 2023-03-22 MED ORDER — POTASSIUM CHLORIDE 10 MEQ/100ML IV SOLN
10.0000 meq | INTRAVENOUS | Status: AC
  Administered 2023-03-22: 10 meq via INTRAVENOUS
  Filled 2023-03-22: qty 100

## 2023-03-22 MED ORDER — SODIUM CHLORIDE 0.9% FLUSH
3.0000 mL | Freq: Two times a day (BID) | INTRAVENOUS | Status: DC
  Administered 2023-03-23 – 2023-04-08 (×31): 3 mL via INTRAVENOUS

## 2023-03-22 MED ORDER — SODIUM CHLORIDE 0.9 % IV SOLN
500.0000 mg | Freq: Once | INTRAVENOUS | Status: AC
  Administered 2023-03-22: 500 mg via INTRAVENOUS
  Filled 2023-03-22: qty 5

## 2023-03-22 MED ORDER — DEXTROSE 50 % IV SOLN
0.0000 mL | INTRAVENOUS | Status: DC | PRN
Start: 2023-03-22 — End: 2023-03-26

## 2023-03-22 MED ORDER — INSULIN REGULAR(HUMAN) IN NACL 100-0.9 UT/100ML-% IV SOLN
INTRAVENOUS | Status: DC
  Administered 2023-03-22: 11 [IU]/h via INTRAVENOUS
  Filled 2023-03-22: qty 100

## 2023-03-22 MED ORDER — POLYETHYLENE GLYCOL 3350 17 G PO PACK
17.0000 g | PACK | Freq: Every day | ORAL | Status: DC | PRN
Start: 2023-03-22 — End: 2023-04-08

## 2023-03-22 MED ORDER — CEFTRIAXONE SODIUM 1 G IJ SOLR
1.0000 g | Freq: Once | INTRAMUSCULAR | Status: AC
  Administered 2023-03-22: 1 g via INTRAVENOUS
  Filled 2023-03-22: qty 10

## 2023-03-22 MED ORDER — SODIUM CHLORIDE 0.9 % IV SOLN
500.0000 mg | INTRAVENOUS | Status: DC
  Administered 2023-03-23 – 2023-03-26 (×4): 500 mg via INTRAVENOUS
  Filled 2023-03-22 (×6): qty 5

## 2023-03-22 NOTE — ED Notes (Signed)
Phlebotomy to obtain second set of blood cultures.

## 2023-03-22 NOTE — ED Notes (Signed)
ED TO INPATIENT HANDOFF REPORT  ED Nurse Name and Phone #: Marcelino Duster [phone]  S Name/Age/Gender Gary Conway 47 y.o. male Room/Bed: 044C/044C  Code Status   Code Status: Full Code  Home/SNF/Other Home Patient oriented to: self, place, time, and situation Is this baseline? Yes   Triage Complete: Triage complete  Chief Complaint Severe sepsis (HCC) [A41.9, R65.20]  Triage Note Pt states he has had a cough, fever, and feels off balance for past week. Pt states last time he felt like this his blood sugar was high. Pt denies nausea, vomiting, diarrhea or constipation.   Allergies Allergies  Allergen Reactions   Other Hives and Itching    Ketchup & Mustard    Level of Care/Admitting Diagnosis ED Disposition     ED Disposition  Admit   Condition  --   Comment  Hospital Area: [facility] [[id number]]  Level of Care: Progressive [102]  Admit to Progressive based on following criteria: MULTISYSTEM THREATS such as stable sepsis, metabolic/electrolyte imbalance with or without encephalopathy that is responding to early treatment.  Admit to Progressive based on following criteria: GI, ENDOCRINE disease patients with GI bleeding, acute liver failure or pancreatitis, stable with diabetic ketoacidosis or thyrotoxicosis (hypothyroid) state.  May admit patient to [facility] or [facility] if equivalent level of care is available:: No  Covid Evaluation: Confirmed COVID Negative  Diagnosis: Severe sepsis [facility]) [[id number]]  Admitting Physician: Synetta Fail [[id number]]  Attending Physician: Synetta Fail [[id number]]  Certification:: I certify this patient will need inpatient services for at least 2 midnights  Expected Medical Readiness: 03/25/2023          B Medical/Surgery History Past Medical History:  Diagnosis Date   Diabetes mellitus, type II (HCC) 2015   Diagnosed during 2015 admission to [facility]   Hemangioma of spine 2015   S/P excision at [facility]  with pathology favoring benign Hemangioma   Past Surgical History:  Procedure Laterality Date   LAMINECTOMY N/A 03/22/2014   Procedure: THORACIC two - four  LAMINECTOMY FOR TUMOR;  Surgeon: Temple Pacini, MD;  Location: [facility] NEURO ORS;  Service: Neurosurgery;  Laterality: N/A;  Throacic two  - four laminectomy for tumor   LEG SURGERY       A IV Location/Drains/Wounds Patient Lines/Drains/Airways Status     Active Line/Drains/Airways     Name Placement date Placement time Site Days   Peripheral IV 03/22/23 20 G Left Antecubital 03/22/23  1209  Antecubital  less than 1   Peripheral IV 03/22/23 20 G Posterior;Right Hand 03/22/23  1756  Hand  less than 1   Incision (Closed) 03/23/14 Back Other (Comment) 03/23/14  0142  -- 3286            Intake/Output Last 24 hours No intake or output data in the 24 hours ending 03/22/23 1903  Labs/Imaging Results for orders placed or performed during the hospital encounter of 03/22/23 (from the past 48 hour(s))  CBG monitoring, ED     Status: Abnormal   Collection Time: 03/22/23 11:15 AM  Result Value Ref Range   Glucose-Capillary 415 (H) 70 - 99 mg/dL    Comment: Glucose reference range applies only to samples taken after fasting for at least 8 hours.  Comprehensive metabolic panel     Status: Abnormal   Collection Time: 03/22/23 11:24 AM  Result Value Ref Range   Sodium 126 (L) 135 - 145 mmol/L   Potassium 4.1 3.5 - 5.1 mmol/L  Chloride 88 (L) 98 - 111 mmol/L   CO2 14 (L) 22 - 32 mmol/L   Glucose, Bld 474 (H) 70 - 99 mg/dL    Comment: Glucose reference range applies only to samples taken after fasting for at least 8 hours.   BUN 68 (H) 6 - 20 mg/dL   Creatinine, Ser 9.52 (H) 0.61 - 1.24 mg/dL   Calcium 8.4 (L) 8.9 - 10.3 mg/dL   Total Protein 7.7 6.5 - 8.1 g/dL   Albumin 2.4 (L) 3.5 - 5.0 g/dL   AST 62 (H) 15 - 41 U/L   ALT 33 0 - 44 U/L   Alkaline Phosphatase 85 38 - 126 U/L   Total Bilirubin 1.8 (H) 0.3 - 1.2 mg/dL   GFR,  Estimated 12 (L) >60 mL/min    Comment: (NOTE) Calculated using the CKD-EPI Creatinine Equation (2021)    Anion gap 24 (H) 5 - 15    Comment: ELECTROLYTES REPEATED TO VERIFY Performed at [facility], [address], [place], [place] [address]   CBC with Differential     Status: Abnormal   Collection Time: 03/22/23 11:24 AM  Result Value Ref Range   WBC 24.5 (H) 4.0 - 10.5 K/uL    Comment: WHITE COUNT CONFIRMED ON SMEAR   RBC 4.99 4.22 - 5.81 MIL/uL   Hemoglobin 10.9 (L) 13.0 - 17.0 g/dL   HCT 44.0 (L) 10.2 - 72.5 %   MCV 67.5 (L) 80.0 - 100.0 fL   MCH 21.8 (L) 26.0 - 34.0 pg   MCHC 32.3 30.0 - 36.0 g/dL   RDW 36.6 44.0 - 34.7 %   Platelets 187 150 - 400 K/uL    Comment: SPECIMEN CHECKED FOR CLOTS REPEATED TO VERIFY PLATELET COUNT CONFIRMED BY SMEAR    nRBC 0.0 0.0 - 0.2 %   Neutrophils Relative % 94 %   Neutro Abs 22.7 (H) 1.7 - 7.7 K/uL   Lymphocytes Relative 1 %   Lymphs Abs 0.3 (L) 0.7 - 4.0 K/uL   Monocytes Relative 1 %   Monocytes Absolute 0.3 0.1 - 1.0 K/uL   Eosinophils Relative 0 %   Eosinophils Absolute 0.0 0.0 - 0.5 K/uL   Basophils Relative 0 %   Basophils Absolute 0.1 0.0 - 0.1 K/uL   WBC Morphology MORPHOLOGY UNREMARKABLE    RBC Morphology MORPHOLOGY UNREMARKABLE    Smear Review Normal platelet morphology    Immature Granulocytes 4 %   Abs Immature Granulocytes 1.02 (H) 0.00 - 0.07 K/uL    Comment: Performed at [facility], [address] [place], [place] [address]  Urinalysis, w/ Reflex to Culture (Infection Suspected) -Urine, Clean Catch     Status: Abnormal   Collection Time: 03/22/23 11:24 AM  Result Value Ref Range   Specimen Source URINE, CLEAN CATCH    Color, Urine YELLOW YELLOW   APPearance CLOUDY (A) CLEAR   Specific Gravity, Urine 1.011 1.005 - 1.030   pH 5.0 5.0 - 8.0   Glucose, UA >=500 (A) NEGATIVE mg/dL   Hgb urine dipstick MODERATE (A) NEGATIVE   Bilirubin Urine NEGATIVE NEGATIVE   Ketones, ur 5 (A) NEGATIVE mg/dL    Protein, ur 30 (A) NEGATIVE mg/dL   Nitrite NEGATIVE NEGATIVE   Leukocytes,Ua NEGATIVE NEGATIVE   RBC / HPF 0-5 0 - 5 RBC/hpf   WBC, UA 0-5 0 - 5 WBC/hpf    Comment:        Reflex urine culture not performed if WBC <=10, OR  if Squamous epithelial cells >5. If Squamous epithelial cells >5 suggest recollection.    Bacteria, UA RARE (A) NONE SEEN   Squamous Epithelial / HPF 0-5 0 - 5 /HPF   Mucus PRESENT    Hyaline Casts, UA PRESENT    Amorphous Crystal PRESENT     Comment: Performed at [facility], [address], [place], [place] [address]  Beta-hydroxybutyric acid     Status: Abnormal   Collection Time: 03/22/23 11:43 AM  Result Value Ref Range   Beta-Hydroxybutyric Acid 1.78 (H) 0.05 - 0.27 mmol/L    Comment: Performed at [facility], [address] [place], [place] [address]  Resp panel by RT-PCR (RSV, Flu A&B, Covid) Anterior Nasal Swab     Status: None   Collection Time: 03/22/23 11:45 AM   Specimen: Anterior Nasal Swab  Result Value Ref Range   SARS Coronavirus 2 by RT PCR NEGATIVE NEGATIVE   Influenza A by PCR NEGATIVE NEGATIVE   Influenza B by PCR NEGATIVE NEGATIVE    Comment: (NOTE) The Xpert Xpress SARS-CoV-2/FLU/RSV plus assay is intended as an aid in the diagnosis of influenza from Nasopharyngeal swab specimens and should not be used as a sole basis for treatment. Nasal washings and aspirates are unacceptable for Xpert Xpress SARS-CoV-2/FLU/RSV testing.  Fact Sheet for Patients: BloggerCourse.com  Fact Sheet for Healthcare Providers: SeriousBroker.it  This test is not yet approved or cleared by the [place] FDA and has been authorized for detection and/or diagnosis of SARS-CoV-2 by FDA under an Emergency Use Authorization (EUA). This EUA will remain in effect (meaning this test can be used) for the duration of the COVID-19 declaration under Section 564(b)(1) of the Act, 21 U.S.C. section  360bbb-3(b)(1), unless the authorization is terminated or revoked.     Resp Syncytial Virus by PCR NEGATIVE NEGATIVE    Comment: (NOTE) Fact Sheet for Patients: BloggerCourse.com  Fact Sheet for Healthcare Providers: SeriousBroker.it  This test is not yet approved or cleared by the [place] FDA and has been authorized for detection and/or diagnosis of SARS-CoV-2 by FDA under an Emergency Use Authorization (EUA). This EUA will remain in effect (meaning this test can be used) for the duration of the COVID-19 declaration under Section 564(b)(1) of the Act, 21 U.S.C. section 360bbb-3(b)(1), unless the authorization is terminated or revoked.  Performed at [facility], [address], [place], [place] [address]   I-Stat Lactic Acid, ED     Status: Abnormal   Collection Time: 03/22/23 11:49 AM  Result Value Ref Range   Lactic Acid, Venous 4.4 (HH) 0.5 - 1.9 mmol/L   Comment NOTIFIED PHYSICIAN   I-Stat venous blood gas, ([facility] ED, [facility], [facility])     Status: Abnormal   Collection Time: 03/22/23 11:56 AM  Result Value Ref Range   pH, Ven 7.351 7.25 - 7.43   pCO2, Ven 27.9 (L) 44 - 60 mmHg   pO2, Ven 49 (H) 32 - 45 mmHg   Bicarbonate 15.5 (L) 20.0 - 28.0 mmol/L   TCO2 16 (L) 22 - 32 mmol/L   O2 Saturation 83 %   Acid-base deficit 9.0 (H) 0.0 - 2.0 mmol/L   Sodium 125 (L) 135 - 145 mmol/L   Potassium 4.1 3.5 - 5.1 mmol/L   Calcium, Ion 0.97 (L) 1.15 - 1.40 mmol/L   HCT 37.0 (L) 39.0 - 52.0 %   Hemoglobin 12.6 (L) 13.0 - 17.0 g/dL   Sample type VENOUS   CBG monitoring,  ED     Status: Abnormal   Collection Time: 03/22/23 12:05 PM  Result Value Ref Range   Glucose-Capillary 478 (H) 70 - 99 mg/dL    Comment: Glucose reference range applies only to samples taken after fasting for at least 8 hours.   Comment 1 Notify RN    Comment 2 Document in Chart   I-Stat venous blood gas, ED     Status: Abnormal   Collection Time: 03/22/23   1:34 PM  Result Value Ref Range   pH, Ven 7.347 7.25 - 7.43   pCO2, Ven 31.6 (L) 44 - 60 mmHg   pO2, Ven 33 32 - 45 mmHg   Bicarbonate 17.3 (L) 20.0 - 28.0 mmol/L   TCO2 18 (L) 22 - 32 mmol/L   O2 Saturation 62 %   Acid-base deficit 7.0 (H) 0.0 - 2.0 mmol/L   Sodium 126 (L) 135 - 145 mmol/L   Potassium 4.2 3.5 - 5.1 mmol/L   Calcium, Ion 0.98 (L) 1.15 - 1.40 mmol/L   HCT 31.0 (L) 39.0 - 52.0 %   Hemoglobin 10.5 (L) 13.0 - 17.0 g/dL   Sample type VENOUS    Comment NOTIFIED PHYSICIAN   CBG monitoring, ED     Status: Abnormal   Collection Time: 03/22/23  5:54 PM  Result Value Ref Range   Glucose-Capillary 439 (H) 70 - 99 mg/dL    Comment: Glucose reference range applies only to samples taken after fasting for at least 8 hours.  Magnesium     Status: None   Collection Time: 03/22/23  6:06 PM  Result Value Ref Range   Magnesium 2.4 1.7 - 2.4 mg/dL    Comment: Performed at [facility], [address] [place], [place] [address]  Basic metabolic panel     Status: Abnormal   Collection Time: 03/22/23  6:06 PM  Result Value Ref Range   Sodium 127 (L) 135 - 145 mmol/L   Potassium 4.3 3.5 - 5.1 mmol/L   Chloride 91 (L) 98 - 111 mmol/L   CO2 18 (L) 22 - 32 mmol/L   Glucose, Bld 441 (H) 70 - 99 mg/dL    Comment: Glucose reference range applies only to samples taken after fasting for at least 8 hours.   BUN 72 (H) 6 - 20 mg/dL   Creatinine, Ser 5.28 (H) 0.61 - 1.24 mg/dL   Calcium 8.0 (L) 8.9 - 10.3 mg/dL   GFR, Estimated 11 (L) >60 mL/min    Comment: (NOTE) Calculated using the CKD-EPI Creatinine Equation (2021)    Anion gap 18 (H) 5 - 15    Comment: Performed at [facility], [address], [place], [place] [address]   DG Chest 2 View  Result Date: 03/22/2023 CLINICAL DATA:  Cough.  Fever. EXAM: CHEST - 2 VIEW COMPARISON:  None Available. FINDINGS: Low lung volume. There are patchy heterogeneous opacities throughout bilateral lungs with relative sparing of upper  lobes, compatible with multilobar pneumonia versus ARDS. There is small-to-moderate right pleural effusion with probable associated right lower lobe collapse. No left pleural effusion. Normal cardio-mediastinal silhouette. No acute osseous abnormalities. The soft tissues are within normal limits. IMPRESSION: *There are patchy heterogeneous opacities throughout bilateral lungs, compatible with multilobar pneumonia versus ARDS. *There is small-to-moderate right pleural effusion with probable associated right lower lobe collapse. No left pleural effusion. Follow-up to clearing is recommended. Electronically Signed   By: Jules Schick M.D.   On: 03/22/2023 12:45  Pending Labs Unresulted Labs (From admission, onward)     Start     Ordered   03/29/23 0500  Creatinine, serum  (enoxaparin (LOVENOX)    CrCl < 30 ml/min)  Once,   R       Comments: while on enoxaparin therapy.    03/22/23 1408   03/23/23 0500  Protime-INR  Tomorrow morning,   R        03/22/23 1408   03/23/23 0500  Cortisol-am, blood  Tomorrow morning,   R        03/22/23 1408   03/23/23 0500  Procalcitonin  Tomorrow morning,   R       References:    Procalcitonin Lower Respiratory Tract Infection AND Sepsis Procalcitonin Algorithm   03/22/23 1408   03/23/23 0500  CBC  Tomorrow morning,   R        03/22/23 1408   03/22/23 2200  Beta-hydroxybutyric acid  (Diabetes Ketoacidosis (DKA))  Now then every 8 hours,   R (with TIMED occurrences)      03/22/23 1415   03/22/23 1415  Basic metabolic panel  (Diabetes Ketoacidosis (DKA))  STAT Now then every 4 hours ,   R (with STAT occurrences)      03/22/23 1415   03/22/23 1415  Hemoglobin A1c  (Diabetes Ketoacidosis (DKA))  Once,   R       Comments: To assess prior glycemic control.    03/22/23 1415   03/22/23 1351  HIV Antibody (routine testing w rflx)  (HIV Antibody (Routine testing w reflex) panel)  Once,   R        03/22/23 1408   03/22/23 1143  Culture, blood (routine x 2)  BLOOD  CULTURE X 2,   R (with STAT occurrences)      03/22/23 1143            Vitals/Pain Today's Vitals   03/22/23 1208 03/22/23 1215 03/22/23 1345 03/22/23 1408  BP: (!) 149/93 (!) 154/98 137/87   Pulse: (!) 142 (!) 137 (!) 132   Resp: 18 (!) 24 (!) 29   Temp: 99.6 F (37.6 C)     TempSrc: Oral     SpO2: 93% 92% 93%   Weight:      Height:      PainSc:    2     Isolation Precautions Airborne and Contact precautions  Medications Medications  lactated ringers infusion (has no administration in time range)  sodium chloride flush (NS) 0.9 % injection 3 mL (has no administration in time range)  lactated ringers bolus 1,000 mL (has no administration in time range)  cefTRIAXone (ROCEPHIN) 2 g in sodium chloride 0.9 % 100 mL IVPB (has no administration in time range)  azithromycin (ZITHROMAX) 500 mg in sodium chloride 0.9 % 250 mL IVPB (has no administration in time range)  acetaminophen (TYLENOL) tablet 650 mg (has no administration in time range)    Or  acetaminophen (TYLENOL) suppository 650 mg (has no administration in time range)  polyethylene glycol (MIRALAX / GLYCOLAX) packet 17 g (has no administration in time range)  enoxaparin (LOVENOX) injection 30 mg (has no administration in time range)  insulin regular, human (MYXREDLIN) 100 units/ 100 mL infusion (11 Units/hr Intravenous Rate/Dose Verify 03/22/23 1832)  lactated ringers infusion (has no administration in time range)  dextrose 5 % in lactated ringers infusion ( Intravenous Not Given 03/22/23 1901)  dextrose 50 % solution 0-50 mL (has no administration in time range)  potassium  chloride 10 mEq in 100 mL IVPB (10 mEq Intravenous New Bag/Given 03/22/23 1817)  lactated ringers bolus 1,000 mL (0 mLs Intravenous Stopped 03/22/23 1355)  sodium chloride 0.9 % bolus 1,000 mL (1,000 mLs Intravenous New Bag/Given 03/22/23 1811)  cefTRIAXone (ROCEPHIN) 1 g in sodium chloride 0.9 % 100 mL IVPB (1 g Intravenous New Bag/Given 03/22/23  1741)  azithromycin (ZITHROMAX) 500 mg in sodium chloride 0.9 % 250 mL IVPB (500 mg Intravenous New Bag/Given 03/22/23 1357)  acetaminophen (TYLENOL) tablet 975 mg (975 mg Oral Given 03/22/23 1301)    Mobility walks     Focused Assessments Cardiac Assessment Handoff:  Cardiac Rhythm: Sinus tachycardia No results found for: "CKTOTAL", "CKMB", "CKMBINDEX", "TROPONINI" No results found for: "DDIMER" Does the Patient currently have chest pain? No   Resp:  Diminished breath sounds, Dyspnea on Exertion   R Recommendations: See Admitting Provider Note  Report given to:   Additional Notes:

## 2023-03-22 NOTE — ED Notes (Signed)
Patient reports no pain at this time.  States "I have been trying to get comfortable and I can't."

## 2023-03-22 NOTE — ED Notes (Signed)
Pt will be coming to room from xray.

## 2023-03-22 NOTE — H&P (Signed)
History and Physical   Gary Conway XBJ:[id number] DOB: 06-27-1975 DOA: 03/22/2023  PCP: Burtis Junes, MD (Inactive)   Patient coming from: Home  Chief Complaint: Fevers  HPI: Gary Conway is a 47 y.o. male with medical history significant of diabetes, benign spinal cord tumor, paraplegia, obesity presenting with 1 week of fevers.  Patient reporting 1 week of fevers, malaise, lightheadedness.  Reports some associated nausea without vomiting.  States he does feel somewhat similar to previous episode of DKA.  Denies chills, chest pain, shortness of breath, abdominal pain, constipation, diarrhea.  ED Course: Vital signs in the ED notable for fever 1-2.3, blood pressure in the 150s systolic, heart rate in the 130s to 140s, respiratory in the 20s.  Lab workup included CMP with sodium 126 which corrects considering glucose of 474, potassium 4.1, chloride 88, bicarb 14, gap 24, BUN 60, creatinine elevated to 5.65 from baseline of 1.1, calcium 8.4, albumin 2.4, AST 62, T. bili 1.8.  CBC with leukocytosis to 24.5 and hemoglobin stable at 10.9.  Lactic acid initially elevated to 4.4 with repeat pending.  Respiratory panel for flu COVID RSV negative.  Urinalysis pending.  Blood cultures pending.  VBG with normal pH and pCO2 of 27.9.  Beta hydroxybutyric acid elevated at 1.78.  Chest x-ray showed patchy opacities diffusely bilaterally consistent with pneumonia but could also represent ARDS.  Noted to have small to moderate right pleural effusion as well.  Patient received ceftriaxone and azithromycin in the ED, 2 L IV fluids, Tylenol.  Review of Systems: As per HPI otherwise all other systems reviewed and are negative.  Past Medical History:  Diagnosis Date   Diabetes mellitus, type II (HCC) 2015   Diagnosed during 2015 admission to [facility]   Hemangioma of spine 2015   S/P excision at [facility] with pathology favoring benign Hemangioma    Past Surgical History:  Procedure Laterality  Date   LAMINECTOMY N/A 03/22/2014   Procedure: THORACIC two - four  LAMINECTOMY FOR TUMOR;  Surgeon: Temple Pacini, MD;  Location: [facility] OR[facility];  Service: Neurosurgery;  Laterality: N/A;  Throacic two  - four laminectomy for tumor   LEG SURGERY      Social History  reports that he has never smoked. He has never used smokeless tobacco. He reports current alcohol use. He reports that he does not use drugs.  Allergies  Allergen Reactions   Other Hives and Itching    Ketchup & Mustard    Family History  Problem Relation Age of Onset   Breast cancer Mother   Reviewed on admission  Prior to Admission medications   Medication Sig Start Date End Date Taking? Authorizing Provider  amLODipine (NORVASC) 5 MG tablet Take 5 mg by mouth daily.   Yes [provider]  metFORMIN (GLUCOPHAGE) 500 MG tablet Take 1 tablet (500 mg total) by mouth 2 (two) times daily with a meal. 09/14/19  Yes Leroy Sea, MD  blood glucose meter kit and supplies KIT Dispense based on patient and insurance preference. Use up to four times daily as directed. (FOR ICD-9 250.00, 250.01). For QAC - HS accuchecks. 09/14/19   Leroy Sea, MD    Physical Exam: Vitals:   03/22/23 1122 03/22/23 1208 03/22/23 1215 03/22/23 1345  BP:  (!) 149/93 (!) 154/98 137/87  Pulse:  (!) 142 (!) 137 (!) 132  Resp:  18 (!) 24 (!) 29  Temp:  99.6 F (37.6 C)    TempSrc:  Oral  SpO2:  93% 92% 93%  Weight: 86.2 kg     Height: 5\' 8"  (1.727 m)       Physical Exam Constitutional:      General: He is not in acute distress.    Appearance: Normal appearance. He is ill-appearing.  HENT:     Head: Normocephalic and atraumatic.     Mouth/Throat:     Mouth: Mucous membranes are moist.     Pharynx: Oropharynx is clear.  Eyes:     Extraocular Movements: Extraocular movements intact.     Pupils: Pupils are equal, round, and reactive to light.  Cardiovascular:     Rate and Rhythm: Regular rhythm. Tachycardia present.      Pulses: Normal pulses.     Heart sounds: Normal heart sounds.  Pulmonary:     Effort: Pulmonary effort is normal. No respiratory distress.     Breath sounds: Normal breath sounds.  Abdominal:     General: Bowel sounds are normal. There is no distension.     Palpations: Abdomen is soft.     Tenderness: There is no abdominal tenderness.  Musculoskeletal:        General: No swelling or deformity.  Skin:    General: Skin is warm and dry.  Neurological:     General: No focal deficit present.     Mental Status: Mental status is at baseline.    Labs on Admission: I have personally reviewed following labs and imaging studies  CBC: Recent Labs  Lab 03/22/23 1124 03/22/23 1156 03/22/23 1334  WBC 24.5*  --   --   NEUTROABS 22.7*  --   --   HGB 10.9* 12.6* 10.5*  HCT 33.7* 37.0* 31.0*  MCV 67.5*  --   --   PLT 187  --   --     Basic Metabolic Panel: Recent Labs  Lab 03/22/23 1124 03/22/23 1156 03/22/23 1334  NA 126* 125* 126*  K 4.1 4.1 4.2  CL 88*  --   --   CO2 14*  --   --   GLUCOSE 474*  --   --   BUN 68*  --   --   CREATININE 5.65*  --   --   CALCIUM 8.4*  --   --     GFR: Estimated Creatinine Clearance: 16.9 mL/min (A) (by C-G formula based on SCr of 5.65 mg/dL (H)).  Liver Function Tests: Recent Labs  Lab 03/22/23 1124  AST 62*  ALT 33  ALKPHOS 85  BILITOT 1.8*  PROT 7.7  ALBUMIN 2.4*    Urine analysis:    Component Value Date/Time   COLORURINE YELLOW 03/22/2023 1124   APPEARANCEUR CLOUDY (A) 03/22/2023 1124   LABSPEC 1.011 03/22/2023 1124   PHURINE 5.0 03/22/2023 1124   GLUCOSEU >=500 (A) 03/22/2023 1124   HGBUR MODERATE (A) 03/22/2023 1124   BILIRUBINUR NEGATIVE 03/22/2023 1124   KETONESUR 5 (A) 03/22/2023 1124   PROTEINUR 30 (A) 03/22/2023 1124   UROBILINOGEN 0.2 03/23/2014 1600   NITRITE NEGATIVE 03/22/2023 1124   LEUKOCYTESUR NEGATIVE 03/22/2023 1124    Radiological Exams on Admission: DG Chest 2 View  Result Date:  03/22/2023 CLINICAL DATA:  Cough.  Fever. EXAM: CHEST - 2 VIEW COMPARISON:  None Available. FINDINGS: Low lung volume. There are patchy heterogeneous opacities throughout bilateral lungs with relative sparing of upper lobes, compatible with multilobar pneumonia versus ARDS. There is small-to-moderate right pleural effusion with probable associated right lower lobe collapse. No left pleural effusion. Normal cardio-mediastinal  silhouette. No acute osseous abnormalities. The soft tissues are within normal limits. IMPRESSION: *There are patchy heterogeneous opacities throughout bilateral lungs, compatible with multilobar pneumonia versus ARDS. *There is small-to-moderate right pleural effusion with probable associated right lower lobe collapse. No left pleural effusion. Follow-up to clearing is recommended. Electronically Signed   By: Jules Schick M.D.   On: 03/22/2023 12:45    EKG: Independently reviewed.  Sinus tachycardia at 144 bpm.  Minimal baseline wandersevere sepsis Pneumonia.  Assessment/Plan Principal Problem:   Severe sepsis (HCC) Active Problems:   Obesity (BMI 30-39.9)   DM (diabetes mellitus) (HCC)   Benign neoplasm of spinal cord (HCC)   CAP (community acquired pneumonia)   Pneumonia Severe sepsis > Presenting as a week of fevers, malaise, lightheadedness. > Meet severe sepsis criteria with fever to 1-2.3, heart rate in the 130s to 140s, respiratory rate in the 20s, lactic acid 4.4, renal failure/AKI.  Source of pneumonia with bilateral diffuse patchy opacities on chest x-ray. > Urinalysis and blood cultures pending in ED.  Patient has received ceftriaxone and azithromycin and 2 L of IV fluids. - Monitor on progressive unit overnight - Continue with ceftriaxone and azithromycin - Trend fever curve and WBC - Give additional liter IV fluids, and continue fluids overnight as below - Procalcitonin  Diabetes DKA > Has had history of DKA in the past and current symptoms did feel  somewhat similar to that. > Likely secondary to sepsis/pneumonia as above. > Bicarb 14, gap 24.  But hydroxybutyric acid 1.78.  Urinalysis pending.  Likely that lactic acidosis and elevated BUN also contributing. > Has received 2 L thus far in the ED and additional liter has been ordered as above. - Start on insulin drip - 20 mEq IV potassium - LR at 125 mL/hr until CBG less than 250 - Switch to D5-LR when 1 CBG less than 250 - Nothing by mouth  - BMET every 4 hours - CBG Q1H - Once anion gap closed 2, start CM diet and if able to eat, resume sliding scale insulin.  - Continue insulin drip for 1 more hour, then discontinue  - DC fluids if eating, drinking, and off insulin drip  AKI > Insetting of sepsis and DKA as above. > Status post 2 L, third liter ordered. - Continue IV fluids as above - Trend renal function and electrolytes  History of benign spinal cord tumor - Noted  DVT prophylaxis: Lovenox Code Status:   Full Family Communication:  None on admission  Disposition Plan:   Patient is from:  Home  Anticipated DC to:  Home  Anticipated DC date:  2 to 5 days  Anticipated DC barriers: None  Consults called:  None Admission status:  Inpatient, progressive  Severity of Illness: The appropriate patient status for this patient is INPATIENT. Inpatient status is judged to be reasonable and necessary in order to provide the required intensity of service to ensure the patient's safety. The patient's presenting symptoms, physical exam findings, and initial radiographic and laboratory data in the context of their chronic comorbidities is felt to place them at high risk for further clinical deterioration. Furthermore, it is not anticipated that the patient will be medically stable for discharge from the hospital within 2 midnights of admission.   * I certify that at the point of admission it is my clinical judgment that the patient will require inpatient hospital care spanning beyond 2  midnights from the point of admission due to high intensity of service, high  risk for further deterioration and high frequency of surveillance required.Synetta Fail MD [facility]  How to contact the [facility] Attending or Consulting provider 7A - 7P or covering provider during after hours 7P -7A, for this patient?   Check the care team in [facility] and look for a) attending/consulting [facility] provider listed and b) the [facility] team listed Log into [email or web address] and use [facility]'s universal password to access. If you do not have the password, please contact the hospital operator. Locate the [facility] provider you are looking for under [facility] and page to a number that you can be directly reached. If you still have difficulty reaching the provider, please page the [facility] (Director on Call) for the Hospitalists listed on amion for assistance.  03/22/2023, 3:42 PM

## 2023-03-22 NOTE — ED Provider Notes (Signed)
[place] EMERGENCY DEPARTMENT AT [facility] Provider Note   CSN: [id number] Arrival date & time: 03/22/23  1107     History  Chief Complaint  Patient presents with   Fever    Gary Conway is a 47 y.o. male.  With a past history of uncontrolled type 2 diabetes and DKA who presents to the ED for fever.  He reports 1 week of fevers, dry cough, lightheadedness and general malaise.  Patient does note some nausea but denies vomiting, chest pain, abdominal pain and shortness of breath.  Feels similar to prior episodes of DKA.  States he only takes metformin for diabetes at home.   Fever      Home Medications Prior to Admission medications   Medication Sig Start Date End Date Taking? Authorizing Provider  blood glucose meter kit and supplies KIT Dispense based on patient and insurance preference. Use up to four times daily as directed. (FOR ICD-9 250.00, 250.01). For QAC - HS accuchecks. 09/14/19   Leroy Sea, MD  insulin glargine (LANTUS) 100 UNIT/ML injection Inject 0.45 mLs (45 Units total) into the skin at bedtime. Dispense insulin pen if approved, if not dispense as needed syringes and needles for 1 month supply. Can switch to Levemir. Diagnosis E 11.65. 09/14/19   Leroy Sea, MD  insulin lispro (HUMALOG) 100 UNIT/ML injection Substitute to any brand approved like Novalog .Before each meal 3 times a day, 140-199 - 2 units, 200-250 - 4 units, 251-299 - 6 units,  300-349 - 8 units,  350 or above 10 units. Dispense syringes and needles as needed, Ok to switch to PEN if approved. DX DM2, Code E11.65 09/14/19 09/13/20  Leroy Sea, MD  Insulin Syringe-Needle U-100 25G X 1" 1 ML MISC For 4 times a day insulin SQ, 1 month supply. Diagnosis E11.65 09/14/19   Leroy Sea, MD  metFORMIN (GLUCOPHAGE) 500 MG tablet Take 1 tablet (500 mg total) by mouth 2 (two) times daily with a meal. 09/14/19   Leroy Sea, MD      Allergies    Patient has no known  allergies.    Review of Systems   Review of Systems  Constitutional:  Positive for fever.    Physical Exam Updated Vital Signs BP (!) 154/98   Pulse (!) 137   Temp 99.6 F (37.6 C) (Oral)   Resp (!) 24   Ht 5\' 8"  (1.727 m)   Wt 86.2 kg   SpO2 92%   BMI 28.89 kg/m  Physical Exam Vitals and nursing note reviewed.  HENT:     Head: Normocephalic and atraumatic.  Eyes:     Pupils: Pupils are equal, round, and reactive to light.  Cardiovascular:     Rate and Rhythm: Normal rate and regular rhythm.  Pulmonary:     Effort: Pulmonary effort is normal.     Breath sounds: Normal breath sounds.  Abdominal:     Palpations: Abdomen is soft.     Tenderness: There is no abdominal tenderness.  Skin:    General: Skin is warm and dry.  Neurological:     Mental Status: He is alert.  Psychiatric:        Mood and Affect: Mood normal.     ED Results / Procedures / Treatments   Labs (all labs ordered are listed, but only abnormal results are displayed) Labs Reviewed  CBG MONITORING, ED - Abnormal; Notable for the following components:      Result Value  Glucose-Capillary 415 (*)    All other components within normal limits  I-STAT CG4 LACTIC ACID, ED - Abnormal; Notable for the following components:   Lactic Acid, Venous 4.4 (*)    All other components within normal limits  I-STAT VENOUS BLOOD GAS, ED - Abnormal; Notable for the following components:   pCO2, Ven 27.9 (*)    pO2, Ven 49 (*)    Bicarbonate 15.5 (*)    TCO2 16 (*)    Acid-base deficit 9.0 (*)    Sodium 125 (*)    Calcium, Ion 0.97 (*)    HCT 37.0 (*)    Hemoglobin 12.6 (*)    All other components within normal limits  CBG MONITORING, ED - Abnormal; Notable for the following components:   Glucose-Capillary 478 (*)    All other components within normal limits  RESP PANEL BY RT-PCR (RSV, FLU A&B, COVID)  RVPGX2  CULTURE, BLOOD (ROUTINE X 2)  CULTURE, BLOOD (ROUTINE X 2)  COMPREHENSIVE METABOLIC PANEL  CBC  WITH DIFFERENTIAL/PLATELET  URINALYSIS, W/ REFLEX TO CULTURE (INFECTION SUSPECTED)  BETA-HYDROXYBUTYRIC ACID  I-STAT CG4 LACTIC ACID, ED    EKG EKG Interpretation Date/Time:  Friday March 22 2023 12:03:40 EDT Ventricular Rate:  144 PR Interval:  112 QRS Duration:  82 QT Interval:  271 QTC Calculation: 420 R Axis:   74  Text Interpretation: Sinus tachycardia Ventricular premature complex Confirmed by Estelle June [phone]) on 03/22/2023 12:55:29 PM  Radiology DG Chest 2 View  Result Date: 03/22/2023 CLINICAL DATA:  Cough.  Fever. EXAM: CHEST - 2 VIEW COMPARISON:  None Available. FINDINGS: Low lung volume. There are patchy heterogeneous opacities throughout bilateral lungs with relative sparing of upper lobes, compatible with multilobar pneumonia versus ARDS. There is small-to-moderate right pleural effusion with probable associated right lower lobe collapse. No left pleural effusion. Normal cardio-mediastinal silhouette. No acute osseous abnormalities. The soft tissues are within normal limits. IMPRESSION: *There are patchy heterogeneous opacities throughout bilateral lungs, compatible with multilobar pneumonia versus ARDS. *There is small-to-moderate right pleural effusion with probable associated right lower lobe collapse. No left pleural effusion. Follow-up to clearing is recommended. Electronically Signed   By: Jules Schick M.D.   On: 03/22/2023 12:45    Procedures Procedures    Medications Ordered in ED Medications  sodium chloride 0.9 % bolus 1,000 mL (has no administration in time range)  cefTRIAXone (ROCEPHIN) 1 g in sodium chloride 0.9 % 100 mL IVPB (has no administration in time range)  azithromycin (ZITHROMAX) 500 mg in sodium chloride 0.9 % 250 mL IVPB (has no administration in time range)  acetaminophen (TYLENOL) tablet 975 mg (has no administration in time range)  lactated ringers bolus 1,000 mL (1,000 mLs Intravenous New Bag/Given 03/22/23 1210)    ED Course/  Medical Decision Making/ A&P Clinical Course as of 03/22/23 1255  Fri Mar 22, 2023  1251 Laboratory workup not consistent with DKA.  Elevated lactate of 4.4.  Chest x-ray shows multilobar pneumonia.  Will cover with azithromycin and ceftriaxone for community-acquired pneumonia and give another bolus of IV fluids.  Will admit to medicine [MP]    Clinical Course User Index [MP] Royanne Foots, DO                                 Medical Decision Making 47 year old male with history as above presenting for fevers, dry cough, nausea and general malaise for the last week or  so.  Tachypneic on exam.  Vital signs also notable for for fever and tachycardia.  Meeting SIRS criteria.  Likely respiratory source with fever and coughing.  Will obtain infectious workup including blood cultures and lactate.  Will also look for concomitant DKA/HHS and provide IV fluids for rehydration and insulin as necessary.  Amount and/or Complexity of Data Reviewed Labs: ordered. Radiology: ordered.           Final Clinical Impression(s) / ED Diagnoses Final diagnoses:  Community acquired pneumonia, unspecified laterality  Uncontrolled type 2 diabetes mellitus with hyperglycemia (HCC)  Fever, unspecified fever cause    Rx / DC Orders ED Discharge Orders     None         Royanne Foots, DO 03/22/23 1255

## 2023-03-22 NOTE — ED Notes (Signed)
Awaiting patient from lobby 

## 2023-03-22 NOTE — ED Triage Notes (Signed)
Pt states he has had a cough, fever, and feels off balance for past week. Pt states last time he felt like this his blood sugar was high. Pt denies nausea, vomiting, diarrhea or constipation.

## 2023-03-22 NOTE — ED Notes (Signed)
Patient medicated with PRN Tylenol for increased discomfort and increasing temperature.

## 2023-03-22 NOTE — ED Notes (Signed)
Phlebotomy at bedside.

## 2023-03-23 DIAGNOSIS — J189 Pneumonia, unspecified organism: Secondary | ICD-10-CM

## 2023-03-23 DIAGNOSIS — D334 Benign neoplasm of spinal cord: Secondary | ICD-10-CM

## 2023-03-23 DIAGNOSIS — E1110 Type 2 diabetes mellitus with ketoacidosis without coma: Secondary | ICD-10-CM

## 2023-03-23 DIAGNOSIS — E669 Obesity, unspecified: Secondary | ICD-10-CM

## 2023-03-23 LAB — GLUCOSE, CAPILLARY
Glucose-Capillary: 128 mg/dL — ABNORMAL HIGH (ref 70–99)
Glucose-Capillary: 137 mg/dL — ABNORMAL HIGH (ref 70–99)
Glucose-Capillary: 149 mg/dL — ABNORMAL HIGH (ref 70–99)
Glucose-Capillary: 151 mg/dL — ABNORMAL HIGH (ref 70–99)
Glucose-Capillary: 155 mg/dL — ABNORMAL HIGH (ref 70–99)
Glucose-Capillary: 159 mg/dL — ABNORMAL HIGH (ref 70–99)
Glucose-Capillary: 160 mg/dL — ABNORMAL HIGH (ref 70–99)
Glucose-Capillary: 182 mg/dL — ABNORMAL HIGH (ref 70–99)
Glucose-Capillary: 182 mg/dL — ABNORMAL HIGH (ref 70–99)
Glucose-Capillary: 186 mg/dL — ABNORMAL HIGH (ref 70–99)
Glucose-Capillary: 211 mg/dL — ABNORMAL HIGH (ref 70–99)
Glucose-Capillary: 223 mg/dL — ABNORMAL HIGH (ref 70–99)
Glucose-Capillary: 238 mg/dL — ABNORMAL HIGH (ref 70–99)

## 2023-03-23 LAB — BASIC METABOLIC PANEL
Anion gap: 14 (ref 5–15)
Anion gap: 14 (ref 5–15)
BUN: 70 mg/dL — ABNORMAL HIGH (ref 6–20)
BUN: 72 mg/dL — ABNORMAL HIGH (ref 6–20)
CO2: 20 mmol/L — ABNORMAL LOW (ref 22–32)
CO2: 21 mmol/L — ABNORMAL LOW (ref 22–32)
Calcium: 8 mg/dL — ABNORMAL LOW (ref 8.9–10.3)
Calcium: 7.8 mg/dL — ABNORMAL LOW (ref 8.9–10.3)
Chloride: 97 mmol/L — ABNORMAL LOW (ref 98–111)
Chloride: 97 mmol/L — ABNORMAL LOW (ref 98–111)
Creatinine, Ser: 5.25 mg/dL — ABNORMAL HIGH (ref 0.61–1.24)
Creatinine, Ser: 5.27 mg/dL — ABNORMAL HIGH (ref 0.61–1.24)
GFR, Estimated: 13 mL/min — ABNORMAL LOW (ref >60)
GFR, Estimated: 13 mL/min — ABNORMAL LOW (ref >60)
Glucose, Bld: 156 mg/dL — ABNORMAL HIGH (ref 70–99)
Glucose, Bld: 153 mg/dL — ABNORMAL HIGH (ref 70–99)
Potassium: 3.9 mmol/L (ref 3.5–5.1)
Potassium: 3.8 mmol/L (ref 3.5–5.1)
Sodium: 131 mmol/L — ABNORMAL LOW (ref 135–145)
Sodium: 132 mmol/L — ABNORMAL LOW (ref 135–145)

## 2023-03-23 LAB — CBC
HCT: 28.9 % — ABNORMAL LOW (ref 39.0–52.0)
Hemoglobin: 9.8 g/dL — ABNORMAL LOW (ref 13.0–17.0)
MCH: 22.2 pg — ABNORMAL LOW (ref 26.0–34.0)
MCHC: 33.9 g/dL (ref 30.0–36.0)
MCV: 65.5 fL — ABNORMAL LOW (ref 80.0–100.0)
Platelets: 193 10*3/uL (ref 150–400)
RBC: 4.41 MIL/uL (ref 4.22–5.81)
RDW: 14.9 % (ref 11.5–15.5)
WBC: 20.3 10*3/uL — ABNORMAL HIGH (ref 4.0–10.5)
nRBC: 0 % (ref 0.0–0.2)

## 2023-03-23 LAB — LACTIC ACID, PLASMA
Lactic Acid, Venous: 1.7 mmol/L (ref 0.5–1.9)
Lactic Acid, Venous: 1.7 mmol/L (ref 0.5–1.9)

## 2023-03-23 LAB — PROCALCITONIN: Procalcitonin: 141.68 ng/mL

## 2023-03-23 LAB — PROTIME-INR
INR: 1.3 — ABNORMAL HIGH (ref 0.8–1.2)
Prothrombin Time: 16.7 s — ABNORMAL HIGH (ref 11.4–15.2)

## 2023-03-23 LAB — CORTISOL-AM, BLOOD: Cortisol - AM: 47.4 ug/dL — ABNORMAL HIGH (ref 6.7–22.6)

## 2023-03-23 LAB — BETA-HYDROXYBUTYRIC ACID: Beta-Hydroxybutyric Acid: 0.44 mmol/L — ABNORMAL HIGH (ref 0.05–0.27)

## 2023-03-23 LAB — D-DIMER, QUANTITATIVE: D-Dimer, Quant: 11.33 ug{FEU}/mL — ABNORMAL HIGH (ref 0.00–0.50)

## 2023-03-23 MED ORDER — HEPARIN (PORCINE) 25000 UT/250ML-% IV SOLN
INTRAVENOUS | Status: AC
  Administered 2023-03-23: 6000 [IU] via INTRAVENOUS
  Filled 2023-03-23: qty 250

## 2023-03-23 MED ORDER — SODIUM CHLORIDE 0.9 % IV BOLUS
1000.0000 mL | Freq: Once | INTRAVENOUS | Status: AC
  Administered 2023-03-23: 1000 mL via INTRAVENOUS

## 2023-03-23 MED ORDER — METOPROLOL TARTRATE 5 MG/5ML IV SOLN
5.0000 mg | Freq: Once | INTRAVENOUS | Status: AC
  Administered 2023-03-23: 5 mg via INTRAVENOUS
  Filled 2023-03-23: qty 5

## 2023-03-23 MED ORDER — HEPARIN BOLUS VIA INFUSION
6000.0000 [IU] | Freq: Once | INTRAVENOUS | Status: AC
  Filled 2023-03-23: qty 6000

## 2023-03-23 MED ORDER — HEPARIN (PORCINE) 25000 UT/250ML-% IV SOLN
1800.0000 [IU]/h | INTRAVENOUS | Status: DC
  Administered 2023-03-23: 1500 [IU]/h via INTRAVENOUS
  Filled 2023-03-23: qty 250

## 2023-03-23 MED ORDER — INSULIN ASPART 100 UNIT/ML IJ SOLN
0.0000 [IU] | Freq: Three times a day (TID) | INTRAMUSCULAR | Status: DC
  Administered 2023-03-23 (×2): 3 [IU] via SUBCUTANEOUS
  Administered 2023-03-24: 2 [IU] via SUBCUTANEOUS
  Administered 2023-03-24: 3 [IU] via SUBCUTANEOUS

## 2023-03-23 NOTE — Plan of Care (Signed)
  Problem: Fluid Volume: Goal: Hemodynamic stability will improve Outcome: Progressing   Problem: Clinical Measurements: Goal: Diagnostic test results will improve Outcome: Progressing Goal: Signs and symptoms of infection will decrease Outcome: Progressing   Problem: Education: Goal: Ability to describe self-care measures that may prevent or decrease complications (Diabetes Survival Skills Education) will improve Outcome: Progressing   Problem: Coping: Goal: Ability to adjust to condition or change in health will improve Outcome: Progressing

## 2023-03-23 NOTE — Progress Notes (Addendum)
PROGRESS NOTE    Gary Conway  XBM:[id number] DOB: 07/21/1975 DOA: 03/22/2023 PCP: Pcp, No   Brief Narrative: Gary Conway is a 47 y.o. male with a history of diabetes, benign spinal cord tumor, paraplegia, obesity.  Patient presented secondary to fevers and was found to have evidence of sepsis secondary to pneumonia.  Blood cultures obtained.  Empiric antibiotics started.  Patient also found to have evidence of DKA and AKI treated with IV fluids and IV insulin..   Assessment and Plan:  Severe sepsis Present on admission with associated lactic acid of 4.4. Complicated by DKA. Presumed source is pneumonia. Blood cultures obtained. Patient started empirically on Ceftriaxone and azithromycin. Procalcitonin obtained and is significantly elevated at 141.68.  -Continue antibiotics -Follow-up blood culture data -Trend procalcitonin  Multilobar pneumonia  Right pleural effusion Noted on imaging. With persistent fevers, tachypnea and tachycardia, possible this fluid could be infected. -Thoracentesis  DKA Present on admission. Likely precipitated by acute illness. Patient managed on insulin IV with improvement in acidosis and closure of anion gap. Patient transitioned to sliding scale inulin. Long acting insulin not started.  AKI Possibly ATN from sepsis. No known baseline; last creatinine of around 1. Creatinine of 5.65 on admission with some mild improvement on IV fluids. -Strict in/out -Daily BMP -Consult nephrology if patient has worsening function  Diabetes mellitus type 2 Uncontrolled with hyperglycemia and A1C of 8.1%. -SSI  Tachycardia Presumed secondary to sepsis. Obtained a d-dimer which is significantly elevated at 11.33. Significant concern for acute PE. -Heparin IV pending results of V/Q scan -Stat V/Q scan secondary to poor renal function at this time  Obesity Estimated body mass index is 28.94 kg/m as calculated from the following:   Height as of this  encounter: 5\' 8"  (1.727 m).   Weight as of this encounter: 86.3 kg.   DVT prophylaxis: Heparin IV Code Status:   Code Status: Full Code Family Communication: None at bedside Disposition Plan: Discharge pending improvement of sepsis physiology, transition to oral antibiotics   Consultants:  None  Procedures:  None  Antimicrobials: Ceftriaxone Azithromycin    Subjective: Patient reports no specific concerns this morning  Objective: BP (!) 134/94 (BP Location: Left Arm)   Pulse (!) 158   Temp 98.5 F (36.9 C) (Oral)   Resp (!) 35   Ht 5\' 8"  (1.727 m)   Wt 86.3 kg   SpO2 96%   BMI 28.94 kg/m   Examination:  General exam: Appears calm and comfortable Respiratory system: Diminished. Slight tachypnea Cardiovascular system: S1 & S2 heard, tachycardia, normal rhythm. Gastrointestinal system: Abdomen is nondistended, soft and nontender. Normal bowel sounds heard. Central nervous system: Alert and oriented. No focal neurological deficits. Psychiatry: Judgement and insight appear normal. Mood & affect appropriate.    Data Reviewed: I have personally reviewed following labs and imaging studies  CBC Lab Results  Component Value Date   WBC 20.3 (H) 03/23/2023   RBC 4.41 03/23/2023   HGB 9.8 (L) 03/23/2023   HCT 28.9 (L) 03/23/2023   MCV 65.5 (L) 03/23/2023   MCH 22.2 (L) 03/23/2023   PLT 193 03/23/2023   MCHC 33.9 03/23/2023   RDW 14.9 03/23/2023   LYMPHSABS 0.3 (L) 03/22/2023   MONOABS 0.3 03/22/2023   EOSABS 0.0 03/22/2023   BASOSABS 0.1 03/22/2023     Last metabolic panel Lab Results  Component Value Date   NA 132 (L) 03/23/2023   K 3.8 03/23/2023   CL 97 (L) 03/23/2023   CO2 21 (  L) 03/23/2023   BUN 72 (H) 03/23/2023   CREATININE 5.27 (H) 03/23/2023   GLUCOSE 153 (H) 03/23/2023   GFRNONAA 13 (L) 03/23/2023   GFRAA >60 09/14/2019   CALCIUM 7.8 (L) 03/23/2023   PROT 7.7 03/22/2023   ALBUMIN 2.4 (L) 03/22/2023   BILITOT 1.8 (H) 03/22/2023   ALKPHOS  85 03/22/2023   AST 62 (H) 03/22/2023   ALT 33 03/22/2023   ANIONGAP 14 03/23/2023    GFR: Estimated Creatinine Clearance: 18.1 mL/min (A) (by C-G formula based on SCr of 5.27 mg/dL (H)).  Recent Results (from the past 240 hour(s))  Culture, blood (routine x 2)     Status: None (Preliminary result)   Collection Time: 03/22/23 11:43 AM   Specimen: BLOOD  Result Value Ref Range Status   Specimen Description BLOOD BLOOD LEFT FOREARM  Final   Special Requests   Final    BOTTLES DRAWN AEROBIC AND ANAEROBIC Blood Culture adequate volume   Culture   Final    NO GROWTH < 24 HOURS Performed at [facility], [address], [place], [place] [address]    Report Status PENDING  Incomplete  Resp panel by RT-PCR (RSV, Flu A&B, Covid) Anterior Nasal Swab     Status: None   Collection Time: 03/22/23 11:45 AM   Specimen: Anterior Nasal Swab  Result Value Ref Range Status   SARS Coronavirus 2 by RT PCR NEGATIVE NEGATIVE Final   Influenza A by PCR NEGATIVE NEGATIVE Final   Influenza B by PCR NEGATIVE NEGATIVE Final    Comment: (NOTE) The Xpert Xpress SARS-CoV-2/FLU/RSV plus assay is intended as an aid in the diagnosis of influenza from Nasopharyngeal swab specimens and should not be used as a sole basis for treatment. Nasal washings and aspirates are unacceptable for Xpert Xpress SARS-CoV-2/FLU/RSV testing.  Fact Sheet for Patients: BloggerCourse.com  Fact Sheet for Healthcare Providers: SeriousBroker.it  This test is not yet approved or cleared by the [place] FDA and has been authorized for detection and/or diagnosis of SARS-CoV-2 by FDA under an Emergency Use Authorization (EUA). This EUA will remain in effect (meaning this test can be used) for the duration of the COVID-19 declaration under Section 564(b)(1) of the Act, 21 U.S.C. section 360bbb-3(b)(1), unless the authorization is terminated or revoked.     Resp Syncytial  Virus by PCR NEGATIVE NEGATIVE Final    Comment: (NOTE) Fact Sheet for Patients: BloggerCourse.com  Fact Sheet for Healthcare Providers: SeriousBroker.it  This test is not yet approved or cleared by the [place] FDA and has been authorized for detection and/or diagnosis of SARS-CoV-2 by FDA under an Emergency Use Authorization (EUA). This EUA will remain in effect (meaning this test can be used) for the duration of the COVID-19 declaration under Section 564(b)(1) of the Act, 21 U.S.C. section 360bbb-3(b)(1), unless the authorization is terminated or revoked.  Performed at [facility], [address] [place], [place] [address]   Culture, blood (routine x 2)     Status: None (Preliminary result)   Collection Time: 03/22/23  1:25 PM   Specimen: BLOOD  Result Value Ref Range Status   Specimen Description BLOOD RIGHT ANTECUBITAL  Final   Special Requests   Final    BOTTLES DRAWN AEROBIC AND ANAEROBIC Blood Culture results may not be optimal due to an excessive volume of blood received in culture bottles   Culture   Final    NO GROWTH < 24 HOURS Performed at [facility],  [address] [place], [place] [address]    Report Status PENDING  Incomplete      Radiology Studies: DG Chest 2 View  Result Date: 03/22/2023 CLINICAL DATA:  Cough.  Fever. EXAM: CHEST - 2 VIEW COMPARISON:  None Available. FINDINGS: Low lung volume. There are patchy heterogeneous opacities throughout bilateral lungs with relative sparing of upper lobes, compatible with multilobar pneumonia versus ARDS. There is small-to-moderate right pleural effusion with probable associated right lower lobe collapse. No left pleural effusion. Normal cardio-mediastinal silhouette. No acute osseous abnormalities. The soft tissues are within normal limits. IMPRESSION: *There are patchy heterogeneous opacities throughout bilateral lungs, compatible with multilobar  pneumonia versus ARDS. *There is small-to-moderate right pleural effusion with probable associated right lower lobe collapse. No left pleural effusion. Follow-up to clearing is recommended. Electronically Signed   By: Jules Schick M.D.   On: 03/22/2023 12:45      LOS: 1 day    Jacquelin Hawking, MD [facility] 03/23/2023, 1:45 PM   If 7PM-7AM, please contact night-coverage [email or web address]

## 2023-03-23 NOTE — Hospital Course (Addendum)
  Brief Narrative:  47 year old with history of DM 2, spinal cord angioma s/p resection 2015, obesity presented to the ED on 10/25 with 1 week of fever and malaise prior to admission.  Patient was found to have DKA and AKI with signs of sepsis and significant other metabolic abnormalities.  Procalcitonin was ordered 41 and chest x-ray showing significant infiltrate bilaterally.  Initially due to concerns of ARDS development, he was intubated on 10/27 and eventually extubated on 10/30.  In the meantime patient received antibiotics, nephrology team has been following.  There has been concerns of pulmonary renal syndrome therefore currently on steroids.     Assessment & Plan:  Principal Problem:   Severe sepsis (HCC) Active Problems:   Obesity (BMI 30-39.9)   DM (diabetes mellitus) (HCC)   Benign neoplasm of spinal cord (HCC)   CAP (community acquired pneumonia)   Acute hypoxemic respiratory failure (HCC)   Acute respiratory failure secondary to ARDS/DAH Severe sepsis secondary to multifocal community-acquired pneumonia -Initially intubated on 10/27 and extubated 10/30.  Bronchoscopy status post BAL (10/27).  Patient still remains on 13 L high flow.  Will slowly wean off as appropriate.  Currently patient is on steroids, IV Zosyn, last day 11/2.  Will trend procalcitonin, periodically check BNP.  Chest x-ray shows persistent right-sided opacity, will defer need for CT scan/thoracentesis to pulmonary team. -Bronchodilators, I-S/flutter valve.   Acute kidney injury on CKD stage V Uremia -Creatinine 2021 was 1.1.  Admission creatinine 5.7 > 4.35.  Renal ultrasound is negative.  Nephro considering renal biopsy.  Diuretics/fluid therapy per nephro.  Will defer need of sodium bicarb to nephrology.   Diabetic ketoacidosis History of diabetes mellitus type 2 - DKA is now resolved.  Sugars are difficult to control as patient is still on steroids.  Will adjust insulin as necessary.   Essential  hypertension -Norvasc, Coreg.  IV as needed   Microcytic anemia -Low iron saturations.  Will place him on supplements.       DVT prophylaxis: heparin injection 5,000 Units Start: 03/25/23 0600 Code Status: Full code Family Communication:   Status is: Inpatient Remains inpatient appropriate because: Continue hospital stay as patient still significantly hypoxic.       Subjective: Seen at bedside, still slightly tachypneic but overall comfortable at rest.     Examination:   General exam: Appears calm and comfortable, 13 L high flow Respiratory system: Diminished breath sounds on the right side Cardiovascular system: S1 & S2 heard, RRR. No JVD, murmurs, rubs, gallops or clicks. No pedal edema. Gastrointestinal system: Abdomen is nondistended, soft and nontender. No organomegaly or masses felt. Normal bowel sounds heard. Central nervous system: Alert and oriented. No focal neurological deficits. Extremities: Symmetric 5 x 5 power. Skin: No rashes, lesions or ulcers Psychiatry: Judgement and insight appear normal. Mood & affect appropriate.

## 2023-03-23 NOTE — Progress Notes (Signed)
Patient adamantly refuses CHG wipes and skin assessment below his waist. Patient has been educated on the hospital protocol for skin assessments on admission and the need to include in his careplan; he also understands the risks he assumes by refusing skin assessment and CHG wipes. Will continue to monitor and encourage compliance with the careplan.

## 2023-03-23 NOTE — Progress Notes (Signed)
Noted patient with red mews, notified rapid response, charge nurse and on-call physician. Discussed with physician interventions. Will continue to monitor.

## 2023-03-23 NOTE — Progress Notes (Signed)
PHARMACY - ANTICOAGULATION CONSULT NOTE  Pharmacy Consult for heparin Indication: possible pulmonary embolus  Allergies  Allergen Reactions   Other Hives and Itching    Ketchup & Mustard    Patient Measurements: Height: 5\' 8"  (172.7 cm) Weight: 86.3 kg (190 lb 4.8 oz) IBW/kg (Calculated) : 68.4 Heparin Dosing Weight: 85.7  Vital Signs: Temp: 98.5 F (36.9 C) (10/26 1300) Temp Source: Oral (10/26 1300) BP: 134/94 (10/26 1300) Pulse Rate: 158 (10/26 0523)  Labs: Recent Labs    03/22/23 1124 03/22/23 1156 03/22/23 1334 03/22/23 1806 03/22/23 2122 03/23/23 0312 03/23/23 0656  HGB 10.9* 12.6* 10.5*  --   --   --  9.8*  HCT 33.7* 37.0* 31.0*  --   --   --  28.9*  PLT 187  --   --   --   --   --  193  LABPROT  --   --   --   --   --   --  16.7*  INR  --   --   --   --   --   --  1.3*  CREATININE 5.65*  --   --    < > 5.42* 5.25* 5.27*   < > = values in this interval not displayed.    Estimated Creatinine Clearance: 18.1 mL/min (A) (by C-G formula based on SCr of 5.27 mg/dL (H)).   Medical History: Past Medical History:  Diagnosis Date   Diabetes mellitus, type II (HCC) 2015   Diagnosed during 2015 admission to [facility]   Hemangioma of spine 2015   S/P excision at [facility] with pathology favoring benign Hemangioma    Medications:  Scheduled:   insulin aspart  0-9 Units Subcutaneous TID WC   sodium chloride flush  3 mL Intravenous Q12H    Assessment: Patient with tachycardia and pneumonia/sepsis. Patient is pending V/Q scan for PE rule out. Noted anemia (Hb 9.8), platelets WNL. Pharmacy consulted to dose heparin.   Goal of Therapy:  Heparin level 0.3-0.7 units/ml Monitor platelets by anticoagulation protocol: Yes   Plan:  Give 6000 (70 units/kg) units bolus x 1 Start heparin infusion at 1500 units/hr Check anti-Xa level in 8 hours and daily while on heparin Continue to monitor H&H and platelets  Cedric Fishman 03/23/2023,4:31 PM

## 2023-03-24 ENCOUNTER — Inpatient Hospital Stay (HOSPITAL_COMMUNITY): Payer: Self-pay

## 2023-03-24 DIAGNOSIS — A419 Sepsis, unspecified organism: Secondary | ICD-10-CM

## 2023-03-24 DIAGNOSIS — R6520 Severe sepsis without septic shock: Secondary | ICD-10-CM

## 2023-03-24 LAB — URINALYSIS, ROUTINE W REFLEX MICROSCOPIC
Bacteria, UA: NONE SEEN
Bilirubin Urine: NEGATIVE
Glucose, UA: 50 mg/dL — AB
Ketones, ur: NEGATIVE mg/dL
Leukocytes,Ua: NEGATIVE
Nitrite: NEGATIVE
Protein, ur: NEGATIVE mg/dL
Specific Gravity, Urine: 1.008 (ref 1.005–1.030)
pH: 5 (ref 5.0–8.0)

## 2023-03-24 LAB — POCT I-STAT 7, (LYTES, BLD GAS, ICA,H+H)
Acid-base deficit: 8 mmol/L — ABNORMAL HIGH (ref 0.0–2.0)
Acid-base deficit: 8 mmol/L — ABNORMAL HIGH (ref 0.0–2.0)
Bicarbonate: 20.6 mmol/L (ref 20.0–28.0)
Bicarbonate: 18 mmol/L — ABNORMAL LOW (ref 20.0–28.0)
Calcium, Ion: 1.04 mmol/L — ABNORMAL LOW (ref 1.15–1.40)
Calcium, Ion: 1 mmol/L — ABNORMAL LOW (ref 1.15–1.40)
HCT: 31 % — ABNORMAL LOW (ref 39.0–52.0)
HCT: 27 % — ABNORMAL LOW (ref 39.0–52.0)
Hemoglobin: 10.5 g/dL — ABNORMAL LOW (ref 13.0–17.0)
Hemoglobin: 9.2 g/dL — ABNORMAL LOW (ref 13.0–17.0)
O2 Saturation: 99 %
O2 Saturation: 98 %
Patient temperature: 36.2
Patient temperature: 36.2
Potassium: 4 mmol/L (ref 3.5–5.1)
Potassium: 4 mmol/L (ref 3.5–5.1)
Sodium: 131 mmol/L — ABNORMAL LOW (ref 135–145)
Sodium: 131 mmol/L — ABNORMAL LOW (ref 135–145)
TCO2: 22 mmol/L (ref 22–32)
TCO2: 19 mmol/L — ABNORMAL LOW (ref 22–32)
pCO2 arterial: 56.1 mm[Hg] — ABNORMAL HIGH (ref 32–48)
pCO2 arterial: 37 mm[Hg] (ref 32–48)
pH, Arterial: 7.168 — CL (ref 7.35–7.45)
pH, Arterial: 7.29 — ABNORMAL LOW (ref 7.35–7.45)
pO2, Arterial: 202 mm[Hg] — ABNORMAL HIGH (ref 83–108)
pO2, Arterial: 108 mm[Hg] (ref 83–108)

## 2023-03-24 LAB — HIV ANTIBODY (ROUTINE TESTING W REFLEX): HIV Screen 4th Generation wRfx: NONREACTIVE

## 2023-03-24 LAB — CBC
HCT: 29.2 % — ABNORMAL LOW (ref 39.0–52.0)
Hemoglobin: 9.6 g/dL — ABNORMAL LOW (ref 13.0–17.0)
MCH: 21.7 pg — ABNORMAL LOW (ref 26.0–34.0)
MCHC: 32.9 g/dL (ref 30.0–36.0)
MCV: 66.1 fL — ABNORMAL LOW (ref 80.0–100.0)
Platelets: 234 10*3/uL (ref 150–400)
RBC: 4.42 MIL/uL (ref 4.22–5.81)
RDW: 15.5 % (ref 11.5–15.5)
WBC: 28.7 10*3/uL — ABNORMAL HIGH (ref 4.0–10.5)
nRBC: 0 % (ref 0.0–0.2)

## 2023-03-24 LAB — MRSA NEXT GEN BY PCR, NASAL: MRSA by PCR Next Gen: NOT DETECTED

## 2023-03-24 LAB — BASIC METABOLIC PANEL
Anion gap: 16 — ABNORMAL HIGH (ref 5–15)
BUN: 74 mg/dL — ABNORMAL HIGH (ref 6–20)
CO2: 16 mmol/L — ABNORMAL LOW (ref 22–32)
Calcium: 7.7 mg/dL — ABNORMAL LOW (ref 8.9–10.3)
Chloride: 97 mmol/L — ABNORMAL LOW (ref 98–111)
Creatinine, Ser: 4.83 mg/dL — ABNORMAL HIGH (ref 0.61–1.24)
GFR, Estimated: 14 mL/min — ABNORMAL LOW (ref >60)
Glucose, Bld: 193 mg/dL — ABNORMAL HIGH (ref 70–99)
Potassium: 3.8 mmol/L (ref 3.5–5.1)
Sodium: 129 mmol/L — ABNORMAL LOW (ref 135–145)

## 2023-03-24 LAB — FERRITIN: Ferritin: 450 ng/mL — ABNORMAL HIGH (ref 24–336)

## 2023-03-24 LAB — GLUCOSE, CAPILLARY
Glucose-Capillary: 197 mg/dL — ABNORMAL HIGH (ref 70–99)
Glucose-Capillary: 211 mg/dL — ABNORMAL HIGH (ref 70–99)
Glucose-Capillary: 229 mg/dL — ABNORMAL HIGH (ref 70–99)
Glucose-Capillary: 242 mg/dL — ABNORMAL HIGH (ref 70–99)
Glucose-Capillary: 250 mg/dL — ABNORMAL HIGH (ref 70–99)
Glucose-Capillary: 284 mg/dL — ABNORMAL HIGH (ref 70–99)

## 2023-03-24 LAB — HEPARIN LEVEL (UNFRACTIONATED)
Heparin Unfractionated: 0.27 [IU]/mL — ABNORMAL LOW (ref 0.30–0.70)
Heparin Unfractionated: 0.29 [IU]/mL — ABNORMAL LOW (ref 0.30–0.70)

## 2023-03-24 LAB — SEDIMENTATION RATE: Sed Rate: 110 mm/h — ABNORMAL HIGH (ref 0–16)

## 2023-03-24 LAB — CK: Total CK: 377 U/L (ref 49–397)

## 2023-03-24 LAB — C-REACTIVE PROTEIN: CRP: 30 mg/dL — ABNORMAL HIGH (ref <1.0)

## 2023-03-24 LAB — BRAIN NATRIURETIC PEPTIDE: B Natriuretic Peptide: 456.3 pg/mL — ABNORMAL HIGH (ref 0.0–100.0)

## 2023-03-24 LAB — HEPATITIS B CORE ANTIBODY, TOTAL: Hep B Core Total Ab: NONREACTIVE

## 2023-03-24 LAB — PROCALCITONIN: Procalcitonin: 82.45 ng/mL

## 2023-03-24 LAB — TROPONIN I (HIGH SENSITIVITY): Troponin I (High Sensitivity): 19 ng/L — ABNORMAL HIGH (ref <18)

## 2023-03-24 LAB — HEPATITIS B SURFACE ANTIGEN: Hepatitis B Surface Ag: NONREACTIVE

## 2023-03-24 LAB — HEPATITIS C ANTIBODY: HCV Ab: NONREACTIVE

## 2023-03-24 MED ORDER — IPRATROPIUM-ALBUTEROL 0.5-2.5 (3) MG/3ML IN SOLN: 3.0000 mL | Freq: Four times a day (QID) | RESPIRATORY_TRACT | Status: DC | PRN

## 2023-03-24 MED ORDER — SODIUM BICARBONATE 650 MG PO TABS: 650.0000 mg | ORAL_TABLET | Freq: Three times a day (TID) | ORAL | Status: DC

## 2023-03-24 MED ORDER — INSULIN REGULAR(HUMAN) IN NACL 100-0.9 UT/100ML-% IV SOLN: INTRAVENOUS | Status: DC

## 2023-03-24 MED ORDER — IPRATROPIUM-ALBUTEROL 0.5-2.5 (3) MG/3ML IN SOLN
RESPIRATORY_TRACT | Status: AC
  Administered 2023-03-24: 3 mL
  Filled 2023-03-24: qty 3

## 2023-03-24 MED ORDER — CHLORHEXIDINE GLUCONATE CLOTH 2 % EX PADS
6.0000 | MEDICATED_PAD | Freq: Every day | CUTANEOUS | Status: DC
  Administered 2023-03-24 – 2023-04-05 (×8): 6 via TOPICAL

## 2023-03-24 MED ORDER — ROCURONIUM BROMIDE 10 MG/ML (PF) SYRINGE
PREFILLED_SYRINGE | INTRAVENOUS | Status: AC
  Administered 2023-03-24: 80 mg
  Filled 2023-03-24: qty 10

## 2023-03-24 MED ORDER — VANCOMYCIN HCL 1750 MG/350ML IV SOLN
1750.0000 mg | Freq: Once | INTRAVENOUS | Status: AC
  Administered 2023-03-24: 1750 mg via INTRAVENOUS
  Filled 2023-03-24: qty 350

## 2023-03-24 MED ORDER — DEXTROSE IN LACTATED RINGERS 5 % IV SOLN: INTRAVENOUS | Status: DC

## 2023-03-24 MED ORDER — FENTANYL 2500MCG IN NS 250ML (10MCG/ML) PREMIX INFUSION
50.0000 ug/h | INTRAVENOUS | Status: DC
Start: 2023-03-24 — End: 2023-03-27
  Administered 2023-03-24: 50 ug/h via INTRAVENOUS
  Administered 2023-03-25 – 2023-03-27 (×2): 100 ug/h via INTRAVENOUS
  Filled 2023-03-24 (×3): qty 250

## 2023-03-24 MED ORDER — POLYETHYLENE GLYCOL 3350 17 G PO PACK
17.0000 g | PACK | Freq: Every day | ORAL | Status: DC
  Administered 2023-03-24 – 2023-03-26 (×3): 17 g
  Filled 2023-03-24 (×3): qty 1

## 2023-03-24 MED ORDER — FAMOTIDINE 20 MG PO TABS
20.0000 mg | ORAL_TABLET | Freq: Two times a day (BID) | ORAL | Status: DC
  Administered 2023-03-24: 20 mg
  Filled 2023-03-24 (×2): qty 1

## 2023-03-24 MED ORDER — ORAL CARE MOUTH RINSE
15.0000 mL | OROMUCOSAL | Status: DC
Start: 2023-03-24 — End: 2023-03-27
  Administered 2023-03-24 – 2023-03-27 (×39): 15 mL via OROMUCOSAL

## 2023-03-24 MED ORDER — FUROSEMIDE 10 MG/ML IJ SOLN
40.0000 mg | Freq: Once | INTRAMUSCULAR | Status: AC
  Administered 2023-03-24: 40 mg via INTRAVENOUS
  Filled 2023-03-24: qty 4

## 2023-03-24 MED ORDER — FENTANYL CITRATE PF 50 MCG/ML IJ SOSY
50.0000 ug | PREFILLED_SYRINGE | Freq: Once | INTRAMUSCULAR | Status: AC
Start: 2023-03-24 — End: 2023-03-24

## 2023-03-24 MED ORDER — SUCCINYLCHOLINE CHLORIDE 200 MG/10ML IV SOSY
PREFILLED_SYRINGE | INTRAVENOUS | Status: AC
  Filled 2023-03-24: qty 10

## 2023-03-24 MED ORDER — ZOLPIDEM TARTRATE 5 MG PO TABS
5.0000 mg | ORAL_TABLET | Freq: Every evening | ORAL | Status: DC | PRN
  Administered 2023-03-24: 5 mg via ORAL
  Filled 2023-03-24: qty 1

## 2023-03-24 MED ORDER — SODIUM CHLORIDE 0.9 % IV SOLN
1000.0000 mg | INTRAVENOUS | Status: AC
  Administered 2023-03-24 – 2023-03-26 (×3): 1000 mg via INTRAVENOUS
  Filled 2023-03-24 (×3): qty 16

## 2023-03-24 MED ORDER — ORAL CARE MOUTH RINSE: 15.0000 mL | OROMUCOSAL | Status: DC | PRN

## 2023-03-24 MED ORDER — LACTATED RINGERS IV SOLN: INTRAVENOUS | Status: DC

## 2023-03-24 MED ORDER — PIPERACILLIN-TAZOBACTAM IN DEX 2-0.25 GM/50ML IV SOLN
2.2500 g | Freq: Four times a day (QID) | INTRAVENOUS | Status: DC
  Administered 2023-03-24 – 2023-03-29 (×19): 2.25 g via INTRAVENOUS
  Filled 2023-03-24 (×21): qty 50

## 2023-03-24 MED ORDER — DOCUSATE SODIUM 50 MG/5ML PO LIQD
100.0000 mg | Freq: Two times a day (BID) | ORAL | Status: DC
  Administered 2023-03-24 – 2023-03-26 (×5): 100 mg
  Filled 2023-03-24 (×5): qty 10

## 2023-03-24 MED ORDER — METHYLPREDNISOLONE SODIUM SUCC 40 MG IJ SOLR: 40.0000 mg | Freq: Two times a day (BID) | INTRAMUSCULAR | Status: DC

## 2023-03-24 MED ORDER — PANTOPRAZOLE SODIUM 40 MG IV SOLR
40.0000 mg | Freq: Every day | INTRAVENOUS | Status: DC
  Administered 2023-03-24: 40 mg via INTRAVENOUS
  Filled 2023-03-24: qty 10

## 2023-03-24 MED ORDER — FENTANYL CITRATE PF 50 MCG/ML IJ SOSY
PREFILLED_SYRINGE | INTRAMUSCULAR | Status: AC
  Administered 2023-03-24: 100 ug via INTRAVENOUS
  Filled 2023-03-24: qty 2

## 2023-03-24 MED ORDER — ETOMIDATE 2 MG/ML IV SOLN
INTRAVENOUS | Status: AC
  Administered 2023-03-24: 20 mg
  Filled 2023-03-24: qty 20

## 2023-03-24 MED ORDER — PROPOFOL 1000 MG/100ML IV EMUL
0.0000 ug/kg/min | INTRAVENOUS | Status: DC
Start: 2023-03-24 — End: 2023-03-25
  Administered 2023-03-24: 5 ug/kg/min via INTRAVENOUS
  Administered 2023-03-24: 20 ug/kg/min via INTRAVENOUS
  Administered 2023-03-25: 10 ug/kg/min via INTRAVENOUS
  Filled 2023-03-24 (×3): qty 100

## 2023-03-24 MED ORDER — VANCOMYCIN VARIABLE DOSE PER UNSTABLE RENAL FUNCTION (PHARMACIST DOSING): Status: DC

## 2023-03-24 MED ORDER — INSULIN ASPART 100 UNIT/ML IJ SOLN
0.0000 [IU] | INTRAMUSCULAR | Status: DC
  Administered 2023-03-24: 3 [IU] via SUBCUTANEOUS
  Administered 2023-03-24 – 2023-03-25 (×2): 5 [IU] via SUBCUTANEOUS

## 2023-03-24 MED ORDER — INSULIN ASPART 100 UNIT/ML IJ SOLN: 0.0000 [IU] | INTRAMUSCULAR | Status: DC

## 2023-03-24 MED ORDER — MIDAZOLAM HCL 2 MG/2ML IJ SOLN
2.0000 mg | INTRAMUSCULAR | Status: DC | PRN
  Administered 2023-03-27: 2 mg via INTRAVENOUS
  Filled 2023-03-24: qty 2

## 2023-03-24 MED ORDER — FENTANYL BOLUS VIA INFUSION
50.0000 ug | INTRAVENOUS | Status: DC | PRN
  Administered 2023-03-24: 100 ug via INTRAVENOUS
  Administered 2023-03-25 – 2023-03-27 (×3): 50 ug via INTRAVENOUS
  Administered 2023-03-27: 100 ug via INTRAVENOUS

## 2023-03-24 MED ORDER — MIDAZOLAM HCL 2 MG/2ML IJ SOLN
INTRAMUSCULAR | Status: AC
  Administered 2023-03-24: 2 mg
  Filled 2023-03-24: qty 2

## 2023-03-24 NOTE — Procedures (Signed)
Central Venous Catheter Insertion Procedure Note  Gary Conway  [id number]  02/21/1976  Date:03/24/23  Time:1:32 PM   Provider Performing:Shaquandra Galano Erby Pian   Procedure: Insertion of Non-tunneled Central Venous [phone]) with US guidance ([id number])   Indication(s) Medication administration and Difficult access  Consent Verbal from family/patient  Anesthesia Topical only with 1% lidocaine   Timeout Verified patient identification, verified procedure, site/side was marked, verified correct patient position, special equipment/implants available, medications/allergies/relevant history reviewed, required imaging and test results available.  Sterile Technique Maximal sterile technique including full sterile barrier drape, hand hygiene, sterile gown, sterile gloves, mask, hair covering, sterile ultrasound probe cover (if used).  Procedure Description Area of catheter insertion was cleaned with chlorhexidine and draped in sterile fashion.  With real-time ultrasound guidance a central venous catheter was placed into the left internal jugular vein. Nonpulsatile blood flow and easy flushing noted in all ports.  The catheter was sutured in place and sterile dressing applied.  Complications/Tolerance None; patient tolerated the procedure well. Chest X-ray is ordered to verify placement for internal jugular or subclavian cannulation.   Chest x-ray is not ordered for femoral cannulation.  EBL Minimal  Specimen(s) None

## 2023-03-24 NOTE — Consult Note (Signed)
Reason for Consult: Renal failure Referring Physician:  Dr. Myrla Halsted  Chief Complaint: Fevers  Assessment/Plan: Acute renal failure - patient with normal kidney function in 09/14/2019 and now with AKI which is slowly improving with fluids. Patient most likely has prerenal azotemia vs ATN from DKA + sepsis but concern was for pulmonary renal syndrome with bloody return with BAL that was not clearing in the LLL on bronchoscopy. - I will send serologies to confirm but urine microscopy does not reveal an active sediment. I did not see any RBC's or dysmorphic RBC's, 0-1 WBC per HPF. - Renal ultrasound.  - Will follow closely with you and determine if he needs further workup or renal replacement. He is nonoliguric and renal function is slowly improving with isotonic fluids.  -Monitor Daily I/Os, Daily weight  -Maintain MAP>65 for optimal renal perfusion.  - Avoid nephrotoxic agents such as IV contrast, NSAIDs, and phosphate containing bowel preps (FLEETS)  Sepsis from multilobar pneumonia on antibiotics currently intubated for respiratory distress with bloody BAL return with bronchoscope (serial lavage of LLL without clearning).  Right side pleural effusion but not enough fluid on ultrasound to tap per VIR. DKA - treated initially with isotonic fluids and insulin almost certainly caused by the PNA/ sepsis. DM   HPI: Gary Conway is an 47 y.o. male DM, benign spinal cord tumor, paraplegia, obesity presenting with fevers for a week associated with malaise, lightheadedness, nausea but no vomiting. He denies chills, myalgias, chest pain or shortness of breath. Patient was found to have leukocytosis with a WBC count of 24.5 Na, glucose of 474, HR 130-140's, COVID neg, pCO2 28. CXR showed diffuse bilateral patchy opacities; pt was treated fro CAP with rocephin and azithromycin.  Procalcitonin elevated at 142 as well. Respiratory status worsened and eventually the patient had a respiratory rate in the  40's despite BiPAP and was subsequently intubated. Urinalysis microscopy did not show any RBC's or WBC's.  ROS Pertinent items are noted in HPI.  Chemistry and CBC: Creatinine, Ser  Date/Time Value Ref Range Status  03/24/2023 03:52 AM 4.83 (H) 0.61 - 1.24 mg/dL Final  16/02/9603 54:09 AM 5.27 (H) 0.61 - 1.24 mg/dL Final  81/19/1478 29:56 AM 5.25 (H) 0.61 - 1.24 mg/dL Final  21/30/8657 84:69 PM 5.42 (H) 0.61 - 1.24 mg/dL Final  62/95/2841 32:44 PM 5.69 (H) 0.61 - 1.24 mg/dL Final  05/30/7251 66:44 AM 5.65 (H) 0.61 - 1.24 mg/dL Final  03/47/4259 56:38 AM 1.10 0.61 - 1.24 mg/dL Final  75/64/3329 51:88 AM 0.96 0.61 - 1.24 mg/dL Final  41/66/0630 16:01 PM 1.43 (H) 0.61 - 1.24 mg/dL Final  09/32/3557 32:20 AM 1.07 0.50 - 1.35 mg/dL Final  25/42/7062 37:62 AM 0.92 0.50 - 1.35 mg/dL Final  83/15/1761 60:73 PM 0.94 0.50 - 1.35 mg/dL Final   Recent Labs  Lab 03/22/23 1124 03/22/23 1156 03/22/23 1334 03/22/23 1806 03/22/23 2122 03/23/23 0312 03/23/23 0656 03/24/23 0352  NA 126* 125* 126* 127* 129* 131* 132* 129*  K 4.1 4.1 4.2 4.3 3.8 3.9 3.8 3.8  CL 88*  --   --  91* 98 97* 97* 97*  CO2 14*  --   --  18* 19* 20* 21* 16*  GLUCOSE 474*  --   --  441* 235* 156* 153* 193*  BUN 68*  --   --  72* 69* 70* 72* 74*  CREATININE 5.65*  --   --  5.69* 5.42* 5.25* 5.27* 4.83*  CALCIUM 8.4*  --   --  8.0* 7.4* 8.0* 7.8* 7.7*   Recent Labs  Lab 03/22/23 1124 03/22/23 1156 03/22/23 1334 03/23/23 0656 03/24/23 0352  WBC 24.5*  --   --  20.3* 28.7*  NEUTROABS 22.7*  --   --   --   --   HGB 10.9* 12.6* 10.5* 9.8* 9.6*  HCT 33.7* 37.0* 31.0* 28.9* 29.2*  MCV 67.5*  --   --  65.5* 66.1*  PLT 187  --   --  193 234   Liver Function Tests: Recent Labs  Lab 03/22/23 1124  AST 62*  ALT 33  ALKPHOS 85  BILITOT 1.8*  PROT 7.7  ALBUMIN 2.4*   No results for input(s): "LIPASE", "AMYLASE" in the last 168 hours. No results for input(s): "AMMONIA" in the last 168 hours. Cardiac Enzymes: No  results for input(s): "CKTOTAL", "CKMB", "CKMBINDEX", "TROPONINI" in the last 168 hours. Iron Studies: No results for input(s): "IRON", "TIBC", "TRANSFERRIN", "FERRITIN" in the last 72 hours. PT/INR: @LABRCNTIP (inr:5)  Xrays/Other Studies: ) Results for orders placed or performed during the hospital encounter of 03/22/23 (from the past 48 hour(s))  CBG monitoring, ED     Status: Abnormal   Collection Time: 03/22/23  5:54 PM  Result Value Ref Range   Glucose-Capillary 439 (H) 70 - 99 mg/dL    Comment: Glucose reference range applies only to samples taken after fasting for at least 8 hours.  HIV Antibody (routine testing w rflx)     Status: None   Collection Time: 03/22/23  6:06 PM  Result Value Ref Range   HIV Screen 4th Generation wRfx Non Reactive Non Reactive    Comment: Performed at [facility], [address], [place], [place] [address]  Magnesium     Status: None   Collection Time: 03/22/23  6:06 PM  Result Value Ref Range   Magnesium 2.4 1.7 - 2.4 mg/dL    Comment: Performed at [facility], [address], [place], [place] [address]  Basic metabolic panel     Status: Abnormal   Collection Time: 03/22/23  6:06 PM  Result Value Ref Range   Sodium 127 (L) 135 - 145 mmol/L   Potassium 4.3 3.5 - 5.1 mmol/L   Chloride 91 (L) 98 - 111 mmol/L   CO2 18 (L) 22 - 32 mmol/L   Glucose, Bld 441 (H) 70 - 99 mg/dL    Comment: Glucose reference range applies only to samples taken after fasting for at least 8 hours.   BUN 72 (H) 6 - 20 mg/dL   Creatinine, Ser 0.98 (H) 0.61 - 1.24 mg/dL   Calcium 8.0 (L) 8.9 - 10.3 mg/dL   GFR, Estimated 11 (L) >60 mL/min    Comment: (NOTE) Calculated using the CKD-EPI Creatinine Equation (2021)    Anion gap 18 (H) 5 - 15    Comment: Performed at [facility], [address] [place], [place] [address]  Hemoglobin A1c     Status: Abnormal   Collection Time: 03/22/23  6:06 PM  Result Value Ref Range   Hgb A1c MFr Bld 8.1 (H) 4.8 -  5.6 %    Comment: (NOTE) Pre diabetes:          5.7%-6.4%  Diabetes:              >6.4%  Glycemic control for   <7.0% adults with diabetes    Mean Plasma Glucose 185.77 mg/dL    Comment: Performed at [facility], [address], [place], [place] [address]  CBG monitoring, ED     Status: Abnormal   Collection Time: 03/22/23  6:31 PM  Result Value Ref Range   Glucose-Capillary 413 (H) 70 - 99 mg/dL    Comment: Glucose reference range applies only to samples taken after fasting for at least 8 hours.  CBG monitoring, ED     Status: Abnormal   Collection Time: 03/22/23  7:30 PM  Result Value Ref Range   Glucose-Capillary 336 (H) 70 - 99 mg/dL    Comment: Glucose reference range applies only to samples taken after fasting for at least 8 hours.  Glucose, capillary     Status: Abnormal   Collection Time: 03/22/23  9:06 PM  Result Value Ref Range   Glucose-Capillary 218 (H) 70 - 99 mg/dL    Comment: Glucose reference range applies only to samples taken after fasting for at least 8 hours.   Comment 1 Notify RN    Comment 2 Document in Chart   Basic metabolic panel     Status: Abnormal   Collection Time: 03/22/23  9:22 PM  Result Value Ref Range   Sodium 129 (L) 135 - 145 mmol/L   Potassium 3.8 3.5 - 5.1 mmol/L   Chloride 98 98 - 111 mmol/L   CO2 19 (L) 22 - 32 mmol/L   Glucose, Bld 235 (H) 70 - 99 mg/dL    Comment: Glucose reference range applies only to samples taken after fasting for at least 8 hours.   BUN 69 (H) 6 - 20 mg/dL   Creatinine, Ser 1.91 (H) 0.61 - 1.24 mg/dL   Calcium 7.4 (L) 8.9 - 10.3 mg/dL   GFR, Estimated 12 (L) >60 mL/min    Comment: (NOTE) Calculated using the CKD-EPI Creatinine Equation (2021)    Anion gap 12 5 - 15    Comment: Performed at [facility], [address] [place], [place] [address]  Beta-hydroxybutyric acid     Status: None   Collection Time: 03/22/23  9:22 PM  Result Value Ref Range   Beta-Hydroxybutyric Acid 0.05 0.05 - 0.27  mmol/L    Comment: Performed at [facility], [address], [place], [place] [address]  Glucose, capillary     Status: Abnormal   Collection Time: 03/22/23  9:49 PM  Result Value Ref Range   Glucose-Capillary 200 (H) 70 - 99 mg/dL    Comment: Glucose reference range applies only to samples taken after fasting for at least 8 hours.  Glucose, capillary     Status: Abnormal   Collection Time: 03/22/23 10:52 PM  Result Value Ref Range   Glucose-Capillary 176 (H) 70 - 99 mg/dL    Comment: Glucose reference range applies only to samples taken after fasting for at least 8 hours.   Comment 1 Notify RN    Comment 2 Document in Chart   Glucose, capillary     Status: Abnormal   Collection Time: 03/22/23 11:57 PM  Result Value Ref Range   Glucose-Capillary 182 (H) 70 - 99 mg/dL    Comment: Glucose reference range applies only to samples taken after fasting for at least 8 hours.  Glucose, capillary     Status: Abnormal   Collection Time: 03/23/23  1:05 AM  Result Value Ref Range   Glucose-Capillary 211 (H) 70 - 99 mg/dL    Comment: Glucose reference range applies only to samples taken after fasting for at least 8 hours.  Glucose, capillary  Status: Abnormal   Collection Time: 03/23/23  2:01 AM  Result Value Ref Range   Glucose-Capillary 182 (H) 70 - 99 mg/dL    Comment: Glucose reference range applies only to samples taken after fasting for at least 8 hours.   Comment 1 Notify RN    Comment 2 Document in Chart   Basic metabolic panel     Status: Abnormal   Collection Time: 03/23/23  3:12 AM  Result Value Ref Range   Sodium 131 (L) 135 - 145 mmol/L   Potassium 3.9 3.5 - 5.1 mmol/L   Chloride 97 (L) 98 - 111 mmol/L   CO2 20 (L) 22 - 32 mmol/L   Glucose, Bld 156 (H) 70 - 99 mg/dL    Comment: Glucose reference range applies only to samples taken after fasting for at least 8 hours.   BUN 70 (H) 6 - 20 mg/dL   Creatinine, Ser 8.41 (H) 0.61 - 1.24 mg/dL   Calcium 8.0 (L) 8.9 - 10.3  mg/dL   GFR, Estimated 13 (L) >60 mL/min    Comment: (NOTE) Calculated using the CKD-EPI Creatinine Equation (2021)    Anion gap 14 5 - 15    Comment: Performed at [facility], [address], [place], [place] [address]  Glucose, capillary     Status: Abnormal   Collection Time: 03/23/23  3:12 AM  Result Value Ref Range   Glucose-Capillary 151 (H) 70 - 99 mg/dL    Comment: Glucose reference range applies only to samples taken after fasting for at least 8 hours.  Glucose, capillary     Status: Abnormal   Collection Time: 03/23/23  4:04 AM  Result Value Ref Range   Glucose-Capillary 128 (H) 70 - 99 mg/dL    Comment: Glucose reference range applies only to samples taken after fasting for at least 8 hours.   Comment 1 Notify RN    Comment 2 Document in Chart   Glucose, capillary     Status: Abnormal   Collection Time: 03/23/23  5:19 AM  Result Value Ref Range   Glucose-Capillary 160 (H) 70 - 99 mg/dL    Comment: Glucose reference range applies only to samples taken after fasting for at least 8 hours.  Glucose, capillary     Status: Abnormal   Collection Time: 03/23/23  6:22 AM  Result Value Ref Range   Glucose-Capillary 159 (H) 70 - 99 mg/dL    Comment: Glucose reference range applies only to samples taken after fasting for at least 8 hours.  Protime-INR     Status: Abnormal   Collection Time: 03/23/23  6:56 AM  Result Value Ref Range   Prothrombin Time 16.7 (H) 11.4 - 15.2 seconds   INR 1.3 (H) 0.8 - 1.2    Comment: (NOTE) INR goal varies based on device and disease states. Performed at [facility], [address] [place], [place] [address]   Cortisol-am, blood     Status: Abnormal   Collection Time: 03/23/23  6:56 AM  Result Value Ref Range   Cortisol - AM 47.4 (H) 6.7 - 22.6 ug/dL    Comment: Performed at [facility], [address] [place], [place] [address]  Procalcitonin     Status: None   Collection Time: 03/23/23  6:56 AM  Result Value Ref  Range   Procalcitonin 141.68 ng/mL    Comment:        Interpretation: PCT >= 10 ng/mL: Important systemic inflammatory response, almost  exclusively due to severe bacterial sepsis or septic shock. (NOTE)       Sepsis PCT Algorithm           Lower Respiratory Tract                                      Infection PCT Algorithm    ----------------------------     ----------------------------         PCT < 0.25 ng/mL                PCT < 0.10 ng/mL          Strongly encourage             Strongly discourage   discontinuation of antibiotics    initiation of antibiotics    ----------------------------     -----------------------------       PCT 0.25 - 0.50 ng/mL            PCT 0.10 - 0.25 ng/mL               OR       >80% decrease in PCT            Discourage initiation of                                            antibiotics      Encourage discontinuation           of antibiotics    ----------------------------     -----------------------------         PCT >= 0.50 ng/mL              PCT 0.26 - 0.50 ng/mL                AND       <80% decrease in PCT             Encourage initiation of                                             antibiotics       Encourage continuation           of antibiotics    ----------------------------     -----------------------------        PCT >= 0.50 ng/mL                  PCT > 0.50 ng/mL               AND         increase in PCT                  Strongly encourage                                      initiation of antibiotics    Strongly encourage escalation           of antibiotics                                     -----------------------------  PCT <= 0.25 ng/mL                                                 OR                                        > 80% decrease in PCT                                      Discontinue / Do not initiate                                             antibiotics  Performed at  [facility], [address] [place], [place] [address]   CBC     Status: Abnormal   Collection Time: 03/23/23  6:56 AM  Result Value Ref Range   WBC 20.3 (H) 4.0 - 10.5 K/uL   RBC 4.41 4.22 - 5.81 MIL/uL   Hemoglobin 9.8 (L) 13.0 - 17.0 g/dL   HCT 60.4 (L) 54.0 - 98.1 %   MCV 65.5 (L) 80.0 - 100.0 fL   MCH 22.2 (L) 26.0 - 34.0 pg   MCHC 33.9 30.0 - 36.0 g/dL   RDW 19.1 47.8 - 29.5 %   Platelets 193 150 - 400 K/uL   nRBC 0.0 0.0 - 0.2 %    Comment: Performed at [facility], [address], [place], [place] [address]  Beta-hydroxybutyric acid     Status: Abnormal   Collection Time: 03/23/23  6:56 AM  Result Value Ref Range   Beta-Hydroxybutyric Acid 0.44 (H) 0.05 - 0.27 mmol/L    Comment: Performed at [facility], [address] [place], [place] [address]  Basic metabolic panel     Status: Abnormal   Collection Time: 03/23/23  6:56 AM  Result Value Ref Range   Sodium 132 (L) 135 - 145 mmol/L   Potassium 3.8 3.5 - 5.1 mmol/L   Chloride 97 (L) 98 - 111 mmol/L   CO2 21 (L) 22 - 32 mmol/L   Glucose, Bld 153 (H) 70 - 99 mg/dL    Comment: Glucose reference range applies only to samples taken after fasting for at least 8 hours.   BUN 72 (H) 6 - 20 mg/dL   Creatinine, Ser 4.69 (H) 0.61 - 1.24 mg/dL   Calcium 7.8 (L) 8.9 - 10.3 mg/dL   GFR, Estimated 13 (L) >60 mL/min    Comment: (NOTE) Calculated using the CKD-EPI Creatinine Equation (2021)    Anion gap 14 5 - 15    Comment: Performed at [facility], [address] [place], [place] [address]  Lactic acid, plasma     Status: None   Collection Time: 03/23/23  6:56 AM  Result Value Ref Range   Lactic Acid, Venous 1.7 0.5 - 1.9 mmol/L    Comment: Performed at [facility], [address] [place], [place] [address]  Glucose, capillary     Status: Abnormal   Collection Time: 03/23/23  7:38 AM  Result Value Ref Range   Glucose-Capillary 155 (H) 70 - 99 mg/dL    Comment: Glucose reference range  applies only to samples taken after fasting for at least 8 hours.  Glucose, capillary     Status: Abnormal   Collection Time: 03/23/23  8:15 AM  Result Value Ref Range   Glucose-Capillary 149 (H) 70 - 99 mg/dL    Comment: Glucose reference range applies only to samples taken after fasting for at least 8 hours.  Glucose, capillary     Status: Abnormal   Collection Time: 03/23/23  9:30 AM  Result Value Ref Range   Glucose-Capillary 137 (H) 70 - 99 mg/dL    Comment: Glucose reference range applies only to samples taken after fasting for at least 8 hours.  Lactic acid, plasma     Status: None   Collection Time: 03/23/23 11:26 AM  Result Value Ref Range   Lactic Acid, Venous 1.7 0.5 - 1.9 mmol/L    Comment: Performed at [facility], [address] [place], [place] [address]  Glucose, capillary     Status: Abnormal   Collection Time: 03/23/23  1:03 PM  Result Value Ref Range   Glucose-Capillary 223 (H) 70 - 99 mg/dL    Comment: Glucose reference range applies only to samples taken after fasting for at least 8 hours.  D-dimer, quantitative     Status: Abnormal   Collection Time: 03/23/23  3:13 PM  Result Value Ref Range   D-Dimer, Quant 11.33 (H) 0.00 - 0.50 ug/mL-FEU    Comment: (NOTE) At the manufacturer cut-off value of 0.5 g/mL FEU, this assay has a negative predictive value of 95-100%.This assay is intended for use in conjunction with a clinical pretest probability (PTP) assessment model to exclude pulmonary embolism (PE) and deep venous thrombosis (DVT) in outpatients suspected of PE or DVT. Results should be correlated with clinical presentation. Performed at [facility], [address], [place], [place] [address]   Glucose, capillary     Status: Abnormal   Collection Time: 03/23/23  5:12 PM  Result Value Ref Range   Glucose-Capillary 238 (H) 70 - 99 mg/dL    Comment: Glucose reference range applies only to samples taken after fasting for at least 8 hours.   Glucose, capillary     Status: Abnormal   Collection Time: 03/23/23  8:55 PM  Result Value Ref Range   Glucose-Capillary 186 (H) 70 - 99 mg/dL    Comment: Glucose reference range applies only to samples taken after fasting for at least 8 hours.   Comment 1 Notify RN    Comment 2 Document in Chart   Heparin level (unfractionated)     Status: Abnormal   Collection Time: 03/24/23 12:54 AM  Result Value Ref Range   Heparin Unfractionated 0.27 (L) 0.30 - 0.70 IU/mL    Comment: (NOTE) The clinical reportable range upper limit is being lowered to >1.10 to align with the FDA approved guidance for the current laboratory assay.  If heparin results are below expected values, and patient dosage has  been confirmed, suggest follow up testing of antithrombin III levels. Performed at [facility], [address], [place], [place] [address]   Basic metabolic panel     Status: Abnormal   Collection Time: 03/24/23  3:52 AM  Result Value Ref Range   Sodium 129 (L) 135 - 145 mmol/L   Potassium 3.8 3.5 - 5.1 mmol/L   Chloride 97 (L) 98 -  111 mmol/L   CO2 16 (L) 22 - 32 mmol/L   Glucose, Bld 193 (H) 70 - 99 mg/dL    Comment: Glucose reference range applies only to samples taken after fasting for at least 8 hours.   BUN 74 (H) 6 - 20 mg/dL   Creatinine, Ser 1.61 (H) 0.61 - 1.24 mg/dL   Calcium 7.7 (L) 8.9 - 10.3 mg/dL   GFR, Estimated 14 (L) >60 mL/min    Comment: (NOTE) Calculated using the CKD-EPI Creatinine Equation (2021)    Anion gap 16 (H) 5 - 15    Comment: Performed at [facility], [address], [place], [place] [address]  CBC     Status: Abnormal   Collection Time: 03/24/23  3:52 AM  Result Value Ref Range   WBC 28.7 (H) 4.0 - 10.5 K/uL   RBC 4.42 4.22 - 5.81 MIL/uL   Hemoglobin 9.6 (L) 13.0 - 17.0 g/dL   HCT 54.0 (L) 98.1 - 19.1 %   MCV 66.1 (L) 80.0 - 100.0 fL   MCH 21.7 (L) 26.0 - 34.0 pg   MCHC 32.9 30.0 - 36.0 g/dL   RDW 47.8 29.5 - 62.1 %   Platelets 234 150  - 400 K/uL    Comment: REPEATED TO VERIFY   nRBC 0.0 0.0 - 0.2 %    Comment: Performed at [facility], [address], [place], [place] [address]  Procalcitonin     Status: None   Collection Time: 03/24/23  3:52 AM  Result Value Ref Range   Procalcitonin 82.45 ng/mL    Comment:        Interpretation: PCT >= 10 ng/mL: Important systemic inflammatory response, almost exclusively due to severe bacterial sepsis or septic shock. (NOTE)       Sepsis PCT Algorithm           Lower Respiratory Tract                                      Infection PCT Algorithm    ----------------------------     ----------------------------         PCT < 0.25 ng/mL                PCT < 0.10 ng/mL          Strongly encourage             Strongly discourage   discontinuation of antibiotics    initiation of antibiotics    ----------------------------     -----------------------------       PCT 0.25 - 0.50 ng/mL            PCT 0.10 - 0.25 ng/mL               OR       >80% decrease in PCT            Discourage initiation of                                            antibiotics      Encourage discontinuation           of antibiotics    ----------------------------     -----------------------------         PCT >= 0.50 ng/mL  PCT 0.26 - 0.50 ng/mL                AND       <80% decrease in PCT             Encourage initiation of                                             antibiotics       Encourage continuation           of antibiotics    ----------------------------     -----------------------------        PCT >= 0.50 ng/mL                  PCT > 0.50 ng/mL               AND         increase in PCT                  Strongly encourage                                      initiation of antibiotics    Strongly encourage escalation           of antibiotics                                     -----------------------------                                           PCT <= 0.25 ng/mL                                                  OR                                        > 80% decrease in PCT                                      Discontinue / Do not initiate                                             antibiotics  Performed at [facility], [address] [place], [place] [address]   Brain natriuretic peptide     Status: Abnormal   Collection Time: 03/24/23  3:52 AM  Result Value Ref Range   B Natriuretic Peptide 456.3 (H) 0.0 - 100.0 pg/mL    Comment: Performed at [facility], [address], [place], [place] [address]  Glucose, capillary     Status: Abnormal   Collection Time: 03/24/23  6:06 AM  Result Value Ref Range   Glucose-Capillary 197 (H) 70 - 99 mg/dL    Comment: Glucose reference range applies only to samples taken after fasting for at least 8 hours.   Comment 1 Notify RN    Comment 2 Document in Chart   Heparin level (unfractionated)     Status: Abnormal   Collection Time: 03/24/23 10:20 AM  Result Value Ref Range   Heparin Unfractionated 0.29 (L) 0.30 - 0.70 IU/mL    Comment: (NOTE) The clinical reportable range upper limit is being lowered to >1.10 to align with the FDA approved guidance for the current laboratory assay.  If heparin results are below expected values, and patient dosage has  been confirmed, suggest follow up testing of antithrombin III levels. Performed at [facility], [address], [place], [place] [address]   Glucose, capillary     Status: Abnormal   Collection Time: 03/24/23 12:03 PM  Result Value Ref Range   Glucose-Capillary 211 (H) 70 - 99 mg/dL    Comment: Glucose reference range applies only to samples taken after fasting for at least 8 hours.   DG CHEST PORT 1 VIEW  Result Date: 03/24/2023 CLINICAL DATA:  [address] Tachypnea [id number] Dyspnea [id number] EXAM: PORTABLE CHEST 1 VIEW COMPARISON:  March 22, 2023 FINDINGS: The cardiomediastinal silhouette is unchanged in contour. Small RIGHT pleural  effusion. No pneumothorax. Increased LEFT-sided airspace opacities compared to prior. Similar to mildly increased RIGHT-sided heterogeneous opacities. IMPRESSION: 1. Increased LEFT-sided airspace opacities compared to prior. Similar to mildly increased RIGHT-sided heterogeneous opacities. Findings are concerning for multifocal pneumonia. 2. Small RIGHT pleural effusion. Electronically Signed   By: Meda Klinefelter M.D.   On: 03/24/2023 12:05    PMH:   Past Medical History:  Diagnosis Date   Diabetes mellitus, type II (HCC) 2015   Diagnosed during 2015 admission to [facility]   Hemangioma of spine 2015   S/P excision at [facility] with pathology favoring benign Hemangioma    PSH:   Past Surgical History:  Procedure Laterality Date   LAMINECTOMY N/A 03/22/2014   Procedure: THORACIC two - four  LAMINECTOMY FOR TUMOR;  Surgeon: Temple Pacini, MD;  Location: [facility] NEUR[facility] ORS;  Service: Neurosurgery;  Laterality: N/A;  Throacic two  - four laminectomy for tumor   LEG SURGERY      Allergies:  Allergies  Allergen Reactions   Other Hives and Itching    Ketchup & Mustard    Medications:   Prior to Admission medications   Medication Sig Start Date End Date Taking? Authorizing Provider  amLODipine (NORVASC) 5 MG tablet Take 5 mg by mouth daily.   Yes [provider]  metFORMIN (GLUCOPHAGE) 500 MG tablet Take 1 tablet (500 mg total) by mouth 2 (two) times daily with a meal. 09/14/19  Yes Leroy Sea, MD  blood glucose meter kit and supplies KIT Dispense based on patient and insurance preference. Use up to four times daily as directed. (FOR ICD-9 250.00, 250.01). For QAC - HS accuchecks. 09/14/19   Leroy Sea, MD    Discontinued Meds:   Medications Discontinued During This Encounter  Medication Reason   Insulin Syringe-Needle U-100 25G X 1" 1 ML MISC    insulin glargine (LANTUS) 100 UNIT/ML injection Patient Preference   insulin lispro (HUMALOG) 100 UNIT/ML injection Patient  Preference   insulin regular, human (MYXREDLIN) 100 units/ 100 mL infusion    enoxaparin (LOVENOX) injection 30 mg  cefTRIAXone (ROCEPHIN) 2 g in sodium chloride 0.9 % 100 mL IVPB    methylPREDNISolone sodium succinate (SOLU-MEDROL) 40 mg/mL injection 40 mg    sodium bicarbonate tablet 650 mg    heparin ADULT infusion 100 units/mL (25000 units/241mL) Discontinued by provider   insulin aspart (novoLOG) injection 0-9 Units    insulin aspart (novoLOG) injection 0-15 Units     Social History:  reports that he has never smoked. He has never used smokeless tobacco. He reports current alcohol use. He reports that he does not use drugs.  Family History:   Family History  Problem Relation Age of Onset   Breast cancer Mother     Blood pressure (!) 161/111, pulse (!) 124, temperature 99.2 F (37.3 C), temperature source Axillary, resp. rate (!) 44, height 5\' 8"  (1.727 m), weight 86.3 kg, SpO2 97%. General appearance: intubated on vent Head: Normocephalic, without obvious abnormality, atraumatic Eyes: negative Throat: lips, mucosa, and tongue normal; teeth and gums normal Neck: no adenopathy, no carotid bruit, no JVD, supple, symmetrical, trachea midline, and thyroid not enlarged, symmetric, no tenderness/mass/nodules Back: symmetric, no curvature. ROM normal. No CVA tenderness. Resp: rales anterior - bilateral and mechanical BS Cardio: regular rate and rhythm GI: soft, non-tender; bowel sounds normal; no masses,  no organomegaly Extremities: edema none Pulses: 2+ and symmetric Skin: Skin color, texture, turgor normal. No purpura, petehiae, rashes or ulcers noted        Rakia Frayne, Len Blalock, MD 03/24/2023, 2:22 PM

## 2023-03-24 NOTE — Progress Notes (Signed)
Pharmacy Antibiotic Note  Gary Conway is a 47 y.o. male admitted on 03/22/2023 with sepsis secondary to aspiration pneumonia. Patient presented with hyperglycemia and AKI. Pharmacy has been consulted for Zosyn (piperacillin-tazobactam) and Vancomycin dosing.  Patient was initiated on ceftriaxone and azithromycin from 10/25 >> 10/27.  WBC up to 28.7, Tmax 99.2 SCr trending down to 4.83 (unknown baseline, last Scr ~1 from 2021)  Plan: Start Zosyn 2.25g IV q6h for CrCl <20 Initiate loading dose of Vancomycin 1750mg  IV x 1, followed by  Vancomycin dosing per levels per unstable renal function    > Goal trough level 15-20    > Check vancomycin levels as needed Monitor daily CBC, temp, SCr, and for clinical signs of improvement  F/u cultures and de-escalate antibiotics as able   Height: 5\' 8"  (172.7 cm) Weight: 86.3 kg (190 lb 4.8 oz) IBW/kg (Calculated) : 68.4  Temp (24hrs), Avg:99 F (37.2 C), Min:98.2 F (36.8 C), Max:99.2 F (37.3 C)  Recent Labs  Lab 03/22/23 1124 03/22/23 1149 03/22/23 1806 03/22/23 2122 03/23/23 0312 03/23/23 0656 03/23/23 1126 03/24/23 0352  WBC 24.5*  --   --   --   --  20.3*  --  28.7*  CREATININE 5.65*  --  5.69* 5.42* 5.25* 5.27*  --  4.83*  LATICACIDVEN  --  4.4*  --   --   --  1.7 1.7  --     Estimated Creatinine Clearance: 19.8 mL/min (A) (by C-G formula based on SCr of 4.83 mg/dL (H)).    Allergies  Allergen Reactions   Other Hives and Itching    Ketchup & Mustard    Antimicrobials this admission: Azithromycin 10/25 >> 10/27 Ceftriaxone 10/25 >> 10/27 Zosyn 10/27 >>  Vancomycin 10/27 >>   Dose adjustments this admission: N/A  Microbiology results: 10/25 BCx: NGTD x2 days 10/27 BAL Cx: sent  10/27 MRSA PCR: sent  Thank you for allowing pharmacy to be a part of this patient's care.  Wilburn Cornelia, PharmD, BCPS Clinical Pharmacist 03/24/2023 1:52 PM   Please refer to [facility] for pharmacy phone number

## 2023-03-24 NOTE — Procedures (Signed)
Intubation Procedure Note  Gary Conway  [id number]  03/05/1976  Date:03/24/23  Time:1:33 PM   Provider Performing:Giankarlo Leamer Erby Pian    Procedure: Intubation (31500)  Indication(s) Respiratory Failure  Consent Verbal patient/family  Anesthesia Etomidate, Versed, Fentanyl, and Rocuronium   Time Out Verified patient identification, verified procedure, site/side was marked, verified correct patient position, special equipment/implants available, medications/allergies/relevant history reviewed, required imaging and test results available.   Sterile Technique Usual hand hygeine, masks, and gloves were used   Procedure Description Patient positioned in bed supine.  Sedation given as noted above.  Patient was intubated with endotracheal tube using Glidescope.  View was Grade 1 full glottis .  Number of attempts was 1.  Colorimetric CO2 detector was consistent with tracheal placement.   Complications/Tolerance None; patient tolerated the procedure well. Chest X-ray is ordered to verify placement.   EBL Minimal   Specimen(s) None

## 2023-03-24 NOTE — Progress Notes (Signed)
PHARMACY - ANTICOAGULATION CONSULT NOTE  Pharmacy Consult for heparin Indication: possible pulmonary embolus  Allergies  Allergen Reactions   Other Hives and Itching    Ketchup & Mustard    Patient Measurements: Height: 5\' 8"  (172.7 cm) Weight: 86.3 kg (190 lb 4.8 oz) IBW/kg (Calculated) : 68.4 Heparin Dosing Weight: 85.7 kg  Vital Signs: Temp: 99.1 F (37.3 C) (10/26 2246) Temp Source: Oral (10/26 2246) BP: 136/107 (10/26 2246) Pulse Rate: 126 (10/26 2246)  Labs: Recent Labs    03/22/23 1124 03/22/23 1156 03/22/23 1334 03/22/23 1806 03/22/23 2122 03/23/23 0312 03/23/23 0656 03/24/23 0054  HGB 10.9* 12.6* 10.5*  --   --   --  9.8*  --   HCT 33.7* 37.0* 31.0*  --   --   --  28.9*  --   PLT 187  --   --   --   --   --  193  --   LABPROT  --   --   --   --   --   --  16.7*  --   INR  --   --   --   --   --   --  1.3*  --   HEPARINUNFRC  --   --   --   --   --   --   --  0.27*  CREATININE 5.65*  --   --    < > 5.42* 5.25* 5.27*  --    < > = values in this interval not displayed.    Estimated Creatinine Clearance: 18.1 mL/min (A) (by C-G formula based on SCr of 5.27 mg/dL (H)).   Medical History: Past Medical History:  Diagnosis Date   Diabetes mellitus, type II (HCC) 2015   Diagnosed during 2015 admission to [facility]   Hemangioma of spine 2015   S/P excision at [facility] with pathology favoring benign Hemangioma    Medications:  Scheduled:   insulin aspart  0-9 Units Subcutaneous TID WC   sodium chloride flush  3 mL Intravenous Q12H    Assessment: Patient with tachycardia and pneumonia/sepsis. Patient is pending V/Q scan for PE rule out. Noted anemia (Hb 9.8), platelets WNL. Pharmacy consulted to dose heparin.   10/26 AM: heparin level 0.26 on 1500 units/hr (subtherapeutic). Per RN, reported heparin had to be switched and was off for some time prior to shift change. Unsure of timing and how long before started running again or signs/symptoms of  bleeding.  Goal of Therapy:  Heparin level 0.3-0.7 units/ml Monitor platelets by anticoagulation protocol: Yes   Plan:  Increase heparin infusion to 1650 units/hr Check anti-Xa level in 8 hours and daily while on heparin Continue to monitor H&H and platelets  Arabella Merles, PharmD. Clinical Pharmacist 03/24/2023 1:36 AM

## 2023-03-24 NOTE — Progress Notes (Addendum)
03/24/2023 Echo shows normal RV function. With suspected alveolitis DC heparin gtt for now Can finish VTE workup with LE duplex. Do not see role for VQ. Start pulse steroids and insulin gtt Will ask nephro to see.  Myrla Halsted MD PCCM

## 2023-03-24 NOTE — Progress Notes (Signed)
PHARMACY - ANTICOAGULATION CONSULT NOTE  Pharmacy Consult for heparin Indication: possible pulmonary embolus  Allergies  Allergen Reactions   Other Hives and Itching    Ketchup & Mustard    Patient Measurements: Height: 5\' 8"  (172.7 cm) Weight: 86.3 kg (190 lb 4.8 oz) IBW/kg (Calculated) : 68.4 Heparin Dosing Weight: 85.7 kg  Vital Signs: Temp: 98.2 F (36.8 C) (10/27 0836) BP: 150/105 (10/27 0318) Pulse Rate: 124 (10/27 0318)  Labs: Recent Labs    03/22/23 1124 03/22/23 1156 03/22/23 1334 03/22/23 1806 03/23/23 0312 03/23/23 0656 03/24/23 0054 03/24/23 0352 03/24/23 1020  HGB 10.9*   < > 10.5*  --   --  9.8*  --  9.6*  --   HCT 33.7*   < > 31.0*  --   --  28.9*  --  29.2*  --   PLT 187  --   --   --   --  193  --  234  --   LABPROT  --   --   --   --   --  16.7*  --   --   --   INR  --   --   --   --   --  1.3*  --   --   --   HEPARINUNFRC  --   --   --   --   --   --  0.27*  --  0.29*  CREATININE 5.65*  --   --    < > 5.25* 5.27*  --  4.83*  --    < > = values in this interval not displayed.    Estimated Creatinine Clearance: 19.8 mL/min (A) (by C-G formula based on SCr of 4.83 mg/dL (H)).   Medical History: Past Medical History:  Diagnosis Date   Diabetes mellitus, type II (HCC) 2015   Diagnosed during 2015 admission to [facility]   Hemangioma of spine 2015   S/P excision at [facility] with pathology favoring benign Hemangioma    Medications:  Scheduled:   insulin aspart  0-9 Units Subcutaneous TID WC   sodium bicarbonate  650 mg Oral TID   sodium chloride flush  3 mL Intravenous Q12H    Assessment: Patient with tachycardia and pneumonia/sepsis. Patient is pending V/Q scan for PE rule out. Pharmacy consulted to dose heparin.   Heparin level subtherapeutic (0.29) on 1650 units/hr. Hgb has slowly downtrended to 9.6, platelets wnl. No issues with infusion or s/sx of bleeding per RN.  Goal of Therapy:  Heparin level 0.3-0.7 units/ml Monitor platelets by  anticoagulation protocol: Yes   Plan:  Increase heparin infusion to 1800 units/hr Check heparin level in 8 hours and daily while on heparin Continue to monitor CBC and s/sx of bleeding  Nicole Kindred, PharmD PGY1 Pharmacy Resident 03/24/2023 11:18 AM

## 2023-03-24 NOTE — Consult Note (Signed)
NAME:  Gary Conway, MRN:  [id number], DOB:  11/29/1975, LOS: 2 ADMISSION DATE:  03/22/2023, CONSULTATION DATE:  03/24/23 REFERRING MD:  Caleb Popp, CHIEF COMPLAINT:  SOB   History of Present Illness:  47 year old man w/ hx of DM presenting with fevers, DKA, AKI.  Respiratory status has worsened over past 24h prompting ICU evaluation.  Currently RR 40s on BIPAP, shallow effort.  Feels tired.  Pertinent  Medical History  DM2 Hemangioma of spine excised 6 years ago  Significant Hospital Events: Including procedures, antibiotic start and stop dates in addition to other pertinent events   10/25 admit 10/27 CCM consult  Interim History / Subjective:  N/A  Objective   Blood pressure (!) 161/111, pulse (!) 124, temperature 98.2 F (36.8 C), resp. rate (!) 44, height 5\' 8"  (1.727 m), weight 86.3 kg, SpO2 97%.    Vent Mode: PSV;BIPAP FiO2 (%):  [40 %] 40 % PEEP:  [6 cmH20] 6 cmH20 Pressure Support:  [8 cmH20] 8 cmH20   Intake/Output Summary (Last 24 hours) at 03/24/2023 1222 Last data filed at 03/24/2023 1130 Gross per 24 hour  Intake 419.93 ml  Output 1300 ml  Net -880.07 ml   Filed Weights   03/22/23 1122 03/22/23 2056  Weight: 86.2 kg 86.3 kg    Examination: General: Ill appearing man on BIPAP HENT: BIPAP with good seal Lungs: crackles, diminished sounds R base, +accessory muscle use Cardiovascular: Tachy, ext warm, sinus on monitor Abdomen: soft, +BS Extremities: no edema Neuro: Moves to command Skin: no rashes  WBC increases Pct downtrending  Resolved Hospital Problem list   N/A  Assessment & Plan:  Acute hypoxemic respiratory failure with developing ARDS History of diabetes Acute renal failure Severe sepsis from PNA Diabetes w/ possible mild DKA- A1c 8.1%  With rapid decline with bring to ICU, intubate, bronch, broaden abx (vanc/zosyn/azithromycin)  Will check bedside echo and [place] R chest although I think he just has R hemidiaphragm paresis misread as  a big effusion.  SSI  Don't really think he has PE given CXR changes, will get LE duplex and echo for now, hold on VQ scan  Family updated at bedside  Best Practice (right click and "Reselect all SmartList Selections" daily)   Diet/type: NPO DVT prophylaxis: systemic heparin GI prophylaxis: H2B Lines: N/A Foley:  Yes, and it is still needed Code Status:  full code Last date of multidisciplinary goals of care discussion [updated at bedside]  Labs   CBC: Recent Labs  Lab 03/22/23 1124 03/22/23 1156 03/22/23 1334 03/23/23 0656 03/24/23 0352  WBC 24.5*  --   --  20.3* 28.7*  NEUTROABS 22.7*  --   --   --   --   HGB 10.9* 12.6* 10.5* 9.8* 9.6*  HCT 33.7* 37.0* 31.0* 28.9* 29.2*  MCV 67.5*  --   --  65.5* 66.1*  PLT 187  --   --  193 234    Basic Metabolic Panel: Recent Labs  Lab 03/22/23 1806 03/22/23 2122 03/23/23 0312 03/23/23 0656 03/24/23 0352  NA 127* 129* 131* 132* 129*  K 4.3 3.8 3.9 3.8 3.8  CL 91* 98 97* 97* 97*  CO2 18* 19* 20* 21* 16*  GLUCOSE 441* 235* 156* 153* 193*  BUN 72* 69* 70* 72* 74*  CREATININE 5.69* 5.42* 5.25* 5.27* 4.83*  CALCIUM 8.0* 7.4* 8.0* 7.8* 7.7*  MG 2.4  --   --   --   --    GFR: Estimated Creatinine Clearance: 19.8 mL/min (  A) (by C-G formula based on SCr of 4.83 mg/dL (H)). Recent Labs  Lab 03/22/23 1124 03/22/23 1149 03/23/23 0656 03/23/23 1126 03/24/23 0352  PROCALCITON  --   --  141.68  --  82.45  WBC 24.5*  --  20.3*  --  28.7*  LATICACIDVEN  --  4.4* 1.7 1.7  --     Liver Function Tests: Recent Labs  Lab 03/22/23 1124  AST 62*  ALT 33  ALKPHOS 85  BILITOT 1.8*  PROT 7.7  ALBUMIN 2.4*   No results for input(s): "LIPASE", "AMYLASE" in the last 168 hours. No results for input(s): "AMMONIA" in the last 168 hours.  ABG    Component Value Date/Time   HCO3 17.3 (L) 03/22/2023 1334   TCO2 18 (L) 03/22/2023 1334   ACIDBASEDEF 7.0 (H) 03/22/2023 1334   O2SAT 62 03/22/2023 1334     Coagulation  Profile: Recent Labs  Lab 03/23/23 0656  INR 1.3*    Cardiac Enzymes: No results for input(s): "CKTOTAL", "CKMB", "CKMBINDEX", "TROPONINI" in the last 168 hours.  HbA1C: Hgb A1c MFr Bld  Date/Time Value Ref Range Status  03/22/2023 06:06 PM 8.1 (H) 4.8 - 5.6 % Final    Comment:    (NOTE) Pre diabetes:          5.7%-6.4%  Diabetes:              >6.4%  Glycemic control for   <7.0% adults with diabetes   09/13/2019 02:26 AM 12.9 (H) 4.8 - 5.6 % Final    Comment:    (NOTE) Pre diabetes:          5.7%-6.4% Diabetes:              >6.4% Glycemic control for   <7.0% adults with diabetes     CBG: Recent Labs  Lab 03/23/23 1303 03/23/23 1712 03/23/23 2055 03/24/23 0606 03/24/23 1203  GLUCAP 223* 238* 186* 197* 211*    Review of Systems:   Limited due to WOB  Past Medical History:  He,  has a past medical history of Diabetes mellitus, type II (HCC) (2015) and Hemangioma of spine (2015).   Surgical History:   Past Surgical History:  Procedure Laterality Date   LAMINECTOMY N/A 03/22/2014   Procedure: THORACIC two - four  LAMINECTOMY FOR TUMOR;  Surgeon: Temple Pacini, MD;  Location: [facility];  Service: Neurosurgery;  Laterality: N/A;  Throacic two  - four laminectomy for tumor   LEG SURGERY       Social History:   reports that he has never smoked. He has never used smokeless tobacco. He reports current alcohol use. He reports that he does not use drugs.   Family History:  His family history includes Breast cancer in his mother.   Allergies Allergies  Allergen Reactions   Other Hives and Itching    Ketchup & Mustard     Home Medications  Prior to Admission medications   Medication Sig Start Date End Date Taking? Authorizing Provider  amLODipine (NORVASC) 5 MG tablet Take 5 mg by mouth daily.   Yes [provider]  metFORMIN (GLUCOPHAGE) 500 MG tablet Take 1 tablet (500 mg total) by mouth 2 (two) times daily with a meal. 09/14/19  Yes Leroy Sea, MD  blood glucose meter kit and supplies KIT Dispense based on patient and insurance preference. Use up to four times daily as directed. (FOR ICD-9 250.00, 250.01). For QAC - HS accuchecks. 09/14/19  Leroy Sea, MD     Critical care time: 34 mins independent of procedures

## 2023-03-24 NOTE — Procedures (Signed)
Bronchoscopy Procedure Note  Gary Conway  [id number]  21-Sep-1975  Date:03/24/23  Time:1:17 PM   Provider Performing:Federica Allport C Katrinka Blazing   Procedure(s):  Flexible bronchoscopy with bronchial alveolar lavage (62130)  Indication(s) ARDS  Consent Verbal family and patient  Anesthesia In place for intubation   Time Out Verified patient identification, verified procedure, site/side was marked, verified correct patient position, special equipment/implants available, medications/allergies/relevant history reviewed, required imaging and test results available.   Sterile Technique Usual hand hygiene, masks, gowns, and gloves were used   Procedure Description Bronchoscope advanced through endotracheal tube and into airway.  Airways were examined down to subsegmental level with findings noted below.   Following diagnostic evaluation, serial lavage LLL without clearing could be c/w alveolitis (pic)  Findings:  - ETT low, repositioned - Bloody BAL return that did not clear ?alveolitis   Complications/Tolerance None; patient tolerated the procedure well. Chest X-ray is not needed post procedure.   EBL Minimal   Specimen(s) LLL BAL   Serial lavage LLL

## 2023-03-24 NOTE — Progress Notes (Signed)
CCM notified of critical ABG results.

## 2023-03-24 NOTE — Significant Event (Addendum)
Rapid Response Event Note   Reason for Call :  Respiratory distress  Initial Focused Assessment:  Pt lying in bed, alert. Breathing is labored. He is diaphoretic. Lung sounds with crackles throughout.   VS: T 99.105F, BP 161/111, HR 122, RR 44, SpO2 97% 15L HFNC CBG: 211  Interventions:  -BiPAP  Plan of Care:  -BiPAP, lasix, check BNP -PCCM consult- transfer to ICU  Call rapid response for additional needs  Event Summary:  MD Notified: Dr. Caleb Popp Call Time: 1153 Arrival Time: 1155 End Time: 1210  Jennye Moccasin, RN

## 2023-03-24 NOTE — Progress Notes (Signed)
Patient presented to radiology for thoracentesis today.   Bilateral pleural space visualized with ultrasound.  No fluid collection for safe approach found.  Ultrasound images saved for documentation purpose.   Thoracentesis was NOT performed.    Lynann Bologna Bettina Warn PA-C 03/24/2023 10:55 AM

## 2023-03-24 NOTE — Progress Notes (Signed)
RT transported pt on bipap from 4E21 to 2M02 without complications. RN at bedside.

## 2023-03-24 NOTE — Progress Notes (Signed)
PROGRESS NOTE    Gary Conway  WUJ:[id number] DOB: 1976-01-30 DOA: 03/22/2023 PCP: Pcp, No   Brief Narrative: Gary Conway is a 47 y.o. male with a history of diabetes, benign spinal cord tumor, paraplegia, obesity.  Patient presented secondary to fevers and was found to have evidence of sepsis secondary to pneumonia.  Blood cultures obtained.  Empiric antibiotics started.  Patient also found to have evidence of DKA and AKI treated with IV fluids and IV insulin. Heparin IV and VQ scan ordered for possible acute PE. Respiratory status continued to worsening with repeat chest x-ray showing worsening disease. Patient started on Lasix IV, BiPAP ordered and PCCM consulted with plan for transfer to ICU for intubation and mechanical ventilation.   Assessment and Plan:  Severe sepsis Present on admission with associated lactic acid of 4.4. Complicated by DKA. Presumed source is pneumonia. Blood cultures obtained. Patient started empirically on Ceftriaxone and azithromycin. Procalcitonin obtained and is significantly elevated at 141.68 but has improved to 82.45 today on antibiotics. Leukocytosis worsened which is unexpected; patient is afebrile for over 24 hours. -Continue antibiotics -Follow-up blood culture data -Trend procalcitonin  Multilobar pneumonia Noted on imaging with presentation correlating with acute infection. Patient started empirically on Ceftriaxone and azithromycin.   Right pleural effusion Noted on imaging. With fevers, tachypnea and tachycardia, possible this fluid could be infected. Thoracentesis attempted but not performed secondary to lack of fluid seen on ultrasound.  Acute respiratory failure with hypoxia Presumed secondary to pneumonia, although ARDS was a consideration. Patient has worsening of respiratory effort and increased oxygen use concerning for development of fluid overload after IV fluids for DKA treatment. Repeat chest x-ray appears to show worsening  disease. -Lasix IV 40 mg x1 -BNP -PCCM consult -Transthoracic Echocardiogram  DKA Present on admission. Likely precipitated by acute illness. Patient managed on insulin IV with improvement in acidosis and closure of anion gap. Patient transitioned to sliding scale inulin. Long acting insulin not started.  AKI Possibly ATN from sepsis. No known baseline; last creatinine of around 1. Creatinine of 5.65 on admission with some mild improvement on IV fluids. Creatinine down to 4.83 today. Patient continues to be non-oliguric.  -Strict in/out -Daily BMP -Consult nephrology if patient has worsening function or urine output decreases  Hyponatremia Initially more related to pseudohyponatremia from hyperglycemia. Now with hyponatremia possibly related to fluid overload. Hopefully will improve with Lasix.  Metabolic acidosis Likely a combination of illness and kidney impairment. Anion gap trended up slightly. Avoiding extra fluid and patient is able to take PO. -Sodium bicarbonate PO  Diabetes mellitus type 2 Uncontrolled with hyperglycemia and A1C of 8.1%. -SSI  Tachycardia Presumed secondary to sepsis. Obtained a d-dimer which is significantly elevated at 11.33. Significant concern for acute PE. -Heparin IV pending results of V/Q scan -Stat V/Q scan secondary to poor renal function at this time (still pending)  Obesity Estimated body mass index is 28.94 kg/m as calculated from the following:   Height as of this encounter: 5\' 8"  (1.727 m).   Weight as of this encounter: 86.3 kg.   DVT prophylaxis: Heparin IV Code Status:   Code Status: Full Code Family Communication: Mother and sister at bedside at bedside Disposition Plan: Discharge pending improvement of sepsis physiology, transition to oral antibiotics   Consultants:  PCCM  Procedures:  None  Antimicrobials: Ceftriaxone Azithromycin    Subjective: Patient with worsening dyspnea this morning. Worried about his  breathing.  Objective: BP (!) 150/105 (BP Location: Left Arm)  Pulse (!) 124   Temp 98.2 F (36.8 C)   Resp 20   Ht 5\' 8"  (1.727 m)   Wt 86.3 kg   SpO2 94%   BMI 28.94 kg/m   Examination:  General exam: Appears calm and comfortable Respiratory system: Diminished. Tachypnea. Cardiovascular system: S1 & S2 heard, RRR. Gastrointestinal system: Abdomen is nondistended, soft and non-tender. No organomegaly or masses felt. Normal bowel sounds heard. Central nervous system: Alert and oriented. No focal neurological deficits. Psychiatry: Judgement and insight appear normal. Mood & affect appropriate.    Data Reviewed: I have personally reviewed following labs and imaging studies  CBC Lab Results  Component Value Date   WBC 28.7 (H) 03/24/2023   RBC 4.42 03/24/2023   HGB 9.6 (L) 03/24/2023   HCT 29.2 (L) 03/24/2023   MCV 66.1 (L) 03/24/2023   MCH 21.7 (L) 03/24/2023   PLT 234 03/24/2023   MCHC 32.9 03/24/2023   RDW 15.5 03/24/2023   LYMPHSABS 0.3 (L) 03/22/2023   MONOABS 0.3 03/22/2023   EOSABS 0.0 03/22/2023   BASOSABS 0.1 03/22/2023     Last metabolic panel Lab Results  Component Value Date   NA 129 (L) 03/24/2023   K 3.8 03/24/2023   CL 97 (L) 03/24/2023   CO2 16 (L) 03/24/2023   BUN 74 (H) 03/24/2023   CREATININE 4.83 (H) 03/24/2023   GLUCOSE 193 (H) 03/24/2023   GFRNONAA 14 (L) 03/24/2023   GFRAA >60 09/14/2019   CALCIUM 7.7 (L) 03/24/2023   PROT 7.7 03/22/2023   ALBUMIN 2.4 (L) 03/22/2023   BILITOT 1.8 (H) 03/22/2023   ALKPHOS 85 03/22/2023   AST 62 (H) 03/22/2023   ALT 33 03/22/2023   ANIONGAP 16 (H) 03/24/2023    GFR: Estimated Creatinine Clearance: 19.8 mL/min (A) (by C-G formula based on SCr of 4.83 mg/dL (H)).  Recent Results (from the past 240 hour(s))  Culture, blood (routine x 2)     Status: None (Preliminary result)   Collection Time: 03/22/23 11:43 AM   Specimen: BLOOD  Result Value Ref Range Status   Specimen Description BLOOD BLOOD  LEFT FOREARM  Final   Special Requests   Final    BOTTLES DRAWN AEROBIC AND ANAEROBIC Blood Culture adequate volume   Culture   Final    NO GROWTH 2 DAYS Performed at [facility], [address], [place], [place] [address]    Report Status PENDING  Incomplete  Resp panel by RT-PCR (RSV, Flu A&B, Covid) Anterior Nasal Swab     Status: None   Collection Time: 03/22/23 11:45 AM   Specimen: Anterior Nasal Swab  Result Value Ref Range Status   SARS Coronavirus 2 by RT PCR NEGATIVE NEGATIVE Final   Influenza A by PCR NEGATIVE NEGATIVE Final   Influenza B by PCR NEGATIVE NEGATIVE Final    Comment: (NOTE) The Xpert Xpress SARS-CoV-2/FLU/RSV plus assay is intended as an aid in the diagnosis of influenza from Nasopharyngeal swab specimens and should not be used as a sole basis for treatment. Nasal washings and aspirates are unacceptable for Xpert Xpress SARS-CoV-2/FLU/RSV testing.  Fact Sheet for Patients: BloggerCourse.com  Fact Sheet for Healthcare Providers: SeriousBroker.it  This test is not yet approved or cleared by the [place] FDA and has been authorized for detection and/or diagnosis of SARS-CoV-2 by FDA under an Emergency Use Authorization (EUA). This EUA will remain in effect (meaning this test can be used) for the duration of the COVID-19 declaration under Section 564(b)(1) of  the Act, 21 U.S.C. section 360bbb-3(b)(1), unless the authorization is terminated or revoked.     Resp Syncytial Virus by PCR NEGATIVE NEGATIVE Final    Comment: (NOTE) Fact Sheet for Patients: BloggerCourse.com  Fact Sheet for Healthcare Providers: SeriousBroker.it  This test is not yet approved or cleared by the [place] FDA and has been authorized for detection and/or diagnosis of SARS-CoV-2 by FDA under an Emergency Use Authorization (EUA). This EUA will remain in effect  (meaning this test can be used) for the duration of the COVID-19 declaration under Section 564(b)(1) of the Act, 21 U.S.C. section 360bbb-3(b)(1), unless the authorization is terminated or revoked.  Performed at [facility], [address] [place], [place] [address]   Culture, blood (routine x 2)     Status: None (Preliminary result)   Collection Time: 03/22/23  1:25 PM   Specimen: BLOOD  Result Value Ref Range Status   Specimen Description BLOOD RIGHT ANTECUBITAL  Final   Special Requests   Final    BOTTLES DRAWN AEROBIC AND ANAEROBIC Blood Culture results may not be optimal due to an excessive volume of blood received in culture bottles   Culture   Final    NO GROWTH 2 DAYS Performed at [facility], [address] [place], [place] [address]    Report Status PENDING  Incomplete      Radiology Studies: No results found.    LOS: 2 days    Jacquelin Hawking, MD [facility] 03/24/2023, 11:56 AM   If 7PM-7AM, please contact night-coverage [email or web address]

## 2023-03-25 ENCOUNTER — Inpatient Hospital Stay (HOSPITAL_COMMUNITY): Payer: Self-pay

## 2023-03-25 DIAGNOSIS — R609 Edema, unspecified: Secondary | ICD-10-CM

## 2023-03-25 DIAGNOSIS — R578 Other shock: Secondary | ICD-10-CM

## 2023-03-25 DIAGNOSIS — J9601 Acute respiratory failure with hypoxia: Secondary | ICD-10-CM

## 2023-03-25 HISTORY — DX: Acute respiratory failure with hypoxia: J96.01

## 2023-03-25 LAB — BODY FLUID CELL COUNT WITH DIFFERENTIAL
Eos, Fluid: 0 %
Lymphs, Fluid: 4 %
Monocyte-Macrophage-Serous Fluid: 26 % — ABNORMAL LOW (ref 50–90)
Neutrophil Count, Fluid: 70 % — ABNORMAL HIGH (ref 0–25)
Total Nucleated Cell Count, Fluid: 750 uL (ref 0–1000)

## 2023-03-25 LAB — CBC WITH DIFFERENTIAL/PLATELET
Abs Immature Granulocytes: 0.29 10*3/uL — ABNORMAL HIGH (ref 0.00–0.07)
Basophils Absolute: 0 10*3/uL (ref 0.0–0.1)
Basophils Relative: 0 %
Eosinophils Absolute: 0 10*3/uL (ref 0.0–0.5)
Eosinophils Relative: 0 %
HCT: 27.1 % — ABNORMAL LOW (ref 39.0–52.0)
Hemoglobin: 8.6 g/dL — ABNORMAL LOW (ref 13.0–17.0)
Immature Granulocytes: 2 %
Lymphocytes Relative: 4 %
Lymphs Abs: 0.8 10*3/uL (ref 0.7–4.0)
MCH: 21.6 pg — ABNORMAL LOW (ref 26.0–34.0)
MCHC: 31.7 g/dL (ref 30.0–36.0)
MCV: 67.9 fL — ABNORMAL LOW (ref 80.0–100.0)
Monocytes Absolute: 0.2 10*3/uL (ref 0.1–1.0)
Monocytes Relative: 1 %
Neutro Abs: 18.5 10*3/uL — ABNORMAL HIGH (ref 1.7–7.7)
Neutrophils Relative %: 93 %
Platelets: UNDETERMINED 10*3/uL (ref 150–400)
RBC: 3.99 MIL/uL — ABNORMAL LOW (ref 4.22–5.81)
RDW: 16.3 % — ABNORMAL HIGH (ref 11.5–15.5)
Smear Review: UNDETERMINED
WBC: 19.8 10*3/uL — ABNORMAL HIGH (ref 4.0–10.5)
nRBC: 0 % (ref 0.0–0.2)

## 2023-03-25 LAB — BASIC METABOLIC PANEL
Anion gap: 17 — ABNORMAL HIGH (ref 5–15)
BUN: 91 mg/dL — ABNORMAL HIGH (ref 6–20)
CO2: 18 mmol/L — ABNORMAL LOW (ref 22–32)
Calcium: 7.3 mg/dL — ABNORMAL LOW (ref 8.9–10.3)
Chloride: 95 mmol/L — ABNORMAL LOW (ref 98–111)
Creatinine, Ser: 4.72 mg/dL — ABNORMAL HIGH (ref 0.61–1.24)
GFR, Estimated: 14 mL/min — ABNORMAL LOW (ref >60)
Glucose, Bld: 332 mg/dL — ABNORMAL HIGH (ref 70–99)
Potassium: 4.2 mmol/L (ref 3.5–5.1)
Sodium: 130 mmol/L — ABNORMAL LOW (ref 135–145)

## 2023-03-25 LAB — CBC
HCT: 26.1 % — ABNORMAL LOW (ref 39.0–52.0)
HCT: 27 % — ABNORMAL LOW (ref 39.0–52.0)
Hemoglobin: 8.3 g/dL — ABNORMAL LOW (ref 13.0–17.0)
Hemoglobin: 8.7 g/dL — ABNORMAL LOW (ref 13.0–17.0)
MCH: 21.6 pg — ABNORMAL LOW (ref 26.0–34.0)
MCH: 21.8 pg — ABNORMAL LOW (ref 26.0–34.0)
MCHC: 31.8 g/dL (ref 30.0–36.0)
MCHC: 32.2 g/dL (ref 30.0–36.0)
MCV: 67.8 fL — ABNORMAL LOW (ref 80.0–100.0)
MCV: 67.5 fL — ABNORMAL LOW (ref 80.0–100.0)
Platelets: 237 10*3/uL (ref 150–400)
Platelets: 252 10*3/uL (ref 150–400)
RBC: 3.85 MIL/uL — ABNORMAL LOW (ref 4.22–5.81)
RBC: 4 MIL/uL — ABNORMAL LOW (ref 4.22–5.81)
RDW: 16.2 % — ABNORMAL HIGH (ref 11.5–15.5)
RDW: 16 % — ABNORMAL HIGH (ref 11.5–15.5)
WBC: 16.8 10*3/uL — ABNORMAL HIGH (ref 4.0–10.5)
WBC: 19.2 10*3/uL — ABNORMAL HIGH (ref 4.0–10.5)
nRBC: 0 % (ref 0.0–0.2)
nRBC: 0 % (ref 0.0–0.2)

## 2023-03-25 LAB — RETICULOCYTES
RBC.: 4.12 MIL/uL — ABNORMAL LOW (ref 4.22–5.81)
Retic Ct Pct: <0.4 % — ABNORMAL LOW (ref 0.4–3.1)

## 2023-03-25 LAB — LACTATE DEHYDROGENASE: LDH: 240 U/L — ABNORMAL HIGH (ref 98–192)

## 2023-03-25 LAB — ECHOCARDIOGRAM COMPLETE
AR max vel: 1.85 cm2
AV Area VTI: 1.78 cm2
AV Area mean vel: 1.73 cm2
AV Mean grad: 2 mm[Hg]
AV Peak grad: 3.6 mm[Hg]
Ao pk vel: 0.94 m/s
Area-P 1/2: 5.62 cm2
Height: 68 in
S' Lateral: 3.8 cm
Weight: 3044.8 [oz_av]

## 2023-03-25 LAB — URINALYSIS, ROUTINE W REFLEX MICROSCOPIC
Bilirubin Urine: NEGATIVE
Glucose, UA: NEGATIVE mg/dL
Ketones, ur: NEGATIVE mg/dL
Leukocytes,Ua: NEGATIVE
Nitrite: NEGATIVE
Protein, ur: NEGATIVE mg/dL
Specific Gravity, Urine: 1.015 (ref 1.005–1.030)
pH: 5 (ref 5.0–8.0)

## 2023-03-25 LAB — STREP PNEUMONIAE URINARY ANTIGEN: Strep Pneumo Urinary Antigen: NEGATIVE

## 2023-03-25 LAB — PHOSPHORUS
Phosphorus: 5.2 mg/dL — ABNORMAL HIGH (ref 2.5–4.6)
Phosphorus: 5.1 mg/dL — ABNORMAL HIGH (ref 2.5–4.6)

## 2023-03-25 LAB — IRON AND TIBC
Iron: 6 ug/dL — ABNORMAL LOW (ref 45–182)
Saturation Ratios: 4 % — ABNORMAL LOW (ref 17.9–39.5)
TIBC: 155 ug/dL — ABNORMAL LOW (ref 250–450)
UIBC: 149 ug/dL

## 2023-03-25 LAB — CREATININE, SERUM
Creatinine, Ser: 4.91 mg/dL — ABNORMAL HIGH (ref 0.61–1.24)
GFR, Estimated: 14 mL/min — ABNORMAL LOW (ref >60)

## 2023-03-25 LAB — PROCALCITONIN: Procalcitonin: 58.82 ng/mL

## 2023-03-25 LAB — PROTEIN / CREATININE RATIO, URINE
Creatinine, Urine: 78 mg/dL
Protein Creatinine Ratio: 0.74 mg/mg{creat} — ABNORMAL HIGH (ref 0.00–0.15)
Total Protein, Urine: 58 mg/dL

## 2023-03-25 LAB — GLUCOSE, CAPILLARY
Glucose-Capillary: 290 mg/dL — ABNORMAL HIGH (ref 70–99)
Glucose-Capillary: 313 mg/dL — ABNORMAL HIGH (ref 70–99)
Glucose-Capillary: 271 mg/dL — ABNORMAL HIGH (ref 70–99)
Glucose-Capillary: 262 mg/dL — ABNORMAL HIGH (ref 70–99)
Glucose-Capillary: 273 mg/dL — ABNORMAL HIGH (ref 70–99)
Glucose-Capillary: 301 mg/dL — ABNORMAL HIGH (ref 70–99)

## 2023-03-25 LAB — MAGNESIUM: Magnesium: 2.8 mg/dL — ABNORMAL HIGH (ref 1.7–2.4)

## 2023-03-25 LAB — TECHNOLOGIST SMEAR REVIEW

## 2023-03-25 LAB — HEPARIN LEVEL (UNFRACTIONATED): Heparin Unfractionated: <0.1 [IU]/mL — ABNORMAL LOW (ref 0.30–0.70)

## 2023-03-25 LAB — TRIGLYCERIDES: Triglycerides: 229 mg/dL — ABNORMAL HIGH (ref <150)

## 2023-03-25 LAB — BETA-HYDROXYBUTYRIC ACID: Beta-Hydroxybutyric Acid: 0.61 mmol/L — ABNORMAL HIGH (ref 0.05–0.27)

## 2023-03-25 MED ORDER — VITAL AF 1.2 CAL PO LIQD
1000.0000 mL | ORAL | Status: DC
  Administered 2023-03-25 – 2023-03-26 (×2): 1000 mL

## 2023-03-25 MED ORDER — INSULIN ASPART 100 UNIT/ML IJ SOLN
0.0000 [IU] | INTRAMUSCULAR | Status: DC
  Administered 2023-03-25: 3 [IU] via SUBCUTANEOUS
  Administered 2023-03-25: 8 [IU] via SUBCUTANEOUS
  Administered 2023-03-25: 11 [IU] via SUBCUTANEOUS

## 2023-03-25 MED ORDER — INSULIN ASPART 100 UNIT/ML IJ SOLN
0.0000 [IU] | INTRAMUSCULAR | Status: DC
  Administered 2023-03-25: 11 [IU] via SUBCUTANEOUS
  Administered 2023-03-25: 15 [IU] via SUBCUTANEOUS
  Administered 2023-03-25 – 2023-03-26 (×3): 11 [IU] via SUBCUTANEOUS

## 2023-03-25 MED ORDER — FAMOTIDINE 20 MG PO TABS
10.0000 mg | ORAL_TABLET | Freq: Every day | ORAL | Status: DC
  Administered 2023-03-25 – 2023-03-27 (×3): 10 mg
  Filled 2023-03-25 (×3): qty 1

## 2023-03-25 MED ORDER — HEPARIN SODIUM (PORCINE) 5000 UNIT/ML IJ SOLN
5000.0000 [IU] | Freq: Three times a day (TID) | INTRAMUSCULAR | Status: DC
  Administered 2023-03-25 – 2023-04-01 (×24): 5000 [IU] via SUBCUTANEOUS
  Filled 2023-03-25 (×24): qty 1

## 2023-03-25 MED ORDER — INSULIN GLARGINE-YFGN 100 UNIT/ML ~~LOC~~ SOLN
10.0000 [IU] | Freq: Every day | SUBCUTANEOUS | Status: DC
  Administered 2023-03-25: 10 [IU] via SUBCUTANEOUS
  Filled 2023-03-25 (×2): qty 0.1

## 2023-03-25 NOTE — Progress Notes (Signed)
Initial Nutrition Assessment  DOCUMENTATION CODES:  Not applicable  INTERVENTION:  Initiate tube feeding via OGT: Vital AF at 70 ml/h (1680 ml per day) Start at 20 and increase by 10 q8h to goal Provides 2016 kcal, 126 gm protein, 1362 ml free water daily  NUTRITION DIAGNOSIS:  Inadequate oral intake related to inability to eat as evidenced by NPO status.  GOAL:  Patient will meet greater than or equal to 90% of their needs  MONITOR:  TF tolerance, I & O's, Labs, Weight trends, Vent status  REASON FOR ASSESSMENT:  Ventilator, Consult Enteral/tube feeding initiation and management  ASSESSMENT:  Pt with hx of DM type 2 presented to ED with ongoing fevers and was found to have sepsis secondary to pneumonia.   10/27 - bronchoscopy, intubated   Patient is currently intubated on ventilator support. OGT in place noted to terminate in the stomach on imaging. No family at bedside at the time of assessment to provide a nutrition hx. On exam, pt appears well nourished. Limited recent weight hx available in chart.  Discussed in rounds. Sedation off but pt not weaning on vent today. TF to be initiated. Noted CBGs are still quite elevated, may need additional insulin to cover TF. RN reports pt having bowel function and BM today.  MV: 14.9 L/min Temp (24hrs), Avg:97.5 F (36.4 C), Min:96.6 F (35.9 C), Max:99.2 F (37.3 C)  Admit / Current weight: 86.3 kg   Intake/Output Summary (Last 24 hours) at 03/25/2023 1122 Last data filed at 03/25/2023 1100 Gross per 24 hour  Intake 1785.4 ml  Output 1895 ml  Net -109.6 ml  Net IO Since Admission: 151.18 mL [03/25/23 1122]  Nutritionally Relevant Medications: Scheduled Meds:  docusate  100 mg BID   famotidine  10 mg Daily   insulin aspart  0-15 Units Q4H   pantoprazole IV  40 mg Daily   polyethylene glycol  17 g Daily   vancomycin    See admin instructions   Continuous Infusions:  azithromycin Stopped (03/24/23 1229)   fentaNYL  infusion INTRAVENOUS 40 mcg/hr (03/25/23 0700)   insulin     piperacillin-tazobactam (ZOSYN)  IV 2.25 g (03/25/23 0714)   propofol (DIPRIVAN) infusion Stopped (03/25/23 0542)   PRN Meds: dextrose, polyethylene glycol  Labs Reviewed: Na 130, chloride 95 BUN 91, creatinine 4.91 CBG ranges from 197-313 mg/dL over the last 24 hours HgbA1c 8.1%  NUTRITION - FOCUSED PHYSICAL EXAM: Flowsheet Row Most Recent Value  Orbital Region No depletion  Upper Arm Region No depletion  Thoracic and Lumbar Region No depletion  Buccal Region No depletion  Temple Region No depletion  Clavicle Bone Region No depletion  Clavicle and Acromion Bone Region No depletion  Scapular Bone Region No depletion  Dorsal Hand Unable to assess  [mittens]  Patellar Region No depletion  Anterior Thigh Region No depletion  Posterior Calf Region No depletion  Edema (RD Assessment) None  Hair Reviewed  Eyes Reviewed  Mouth Reviewed  Skin Reviewed  Nails Reviewed   Diet Order:   Diet Order     None       EDUCATION NEEDS:  Not appropriate for education at this time  Skin:  Skin Assessment: Reviewed RN Assessment  Last BM:  10/28 - type 6  Height:  Ht Readings from Last 1 Encounters:  03/22/23 5\' 8"  (1.727 m)    Weight:  Wt Readings from Last 1 Encounters:  03/22/23 86.3 kg    Ideal Body Weight:  70 kg  BMI:  Body mass index is 28.94 kg/m.  Estimated Nutritional Needs:  Kcal:  2000-2200 kcal/d Protein:  105-120 g/d Fluid:  2L    Greig Castilla, RD, LDN Clinical Dietitian RD pager # available in AMION  After hours/weekend pager # available in [facility]

## 2023-03-25 NOTE — Progress Notes (Signed)
eLink Physician-Brief Progress Note Patient Name: Gary Conway DOB: Jan 04, 1976 MRN: [id number]   Date of Service  03/25/2023  HPI/Events of Note  Notified of elevated glucose on low dose SSI Also inquiring about DVT prophylaxis, previously on heparin drip which has been discontinued EGFR 14  eICU Interventions  Increase SSI to moderate dose and adjust as needed Start heparin 5000 q 8 for VTE prophylaxis     Intervention Category Intermediate Interventions: Hyperglycemia - evaluation and treatment;Best-practice therapies (e.g. DVT, beta blocker, etc.)  Rosalie Gums Jeraline Marcinek 03/25/2023, 4:19 AM

## 2023-03-25 NOTE — Progress Notes (Signed)
*  PRELIMINARY RESULTS* Echocardiogram 2D Echocardiogram has been performed.  Gary Conway 03/25/2023, 2:47 PM

## 2023-03-25 NOTE — Inpatient Diabetes Management (Signed)
Inpatient Diabetes Program Recommendations  AACE/ADA: New Consensus Statement on Inpatient Glycemic Control (2015)  Target Ranges:  Prepandial:   less than 140 mg/dL      Peak postprandial:   less than 180 mg/dL (1-2 hours)      Critically ill patients:  140 - 180 mg/dL   Lab Results  Component Value Date   GLUCAP 313 (H) 03/25/2023   HGBA1C 8.1 (H) 03/22/2023    Review of Glycemic Control  Latest Reference Range & Units 03/24/23 06:06 03/24/23 12:03 03/24/23 14:59 03/24/23 18:01 03/24/23 19:42 03/24/23 23:08 03/25/23 03:23 03/25/23 07:10  Glucose-Capillary 70 - 99 mg/dL 213 (H) 086 (H) 578 (H) 242 (H) 250 (H) 284 (H) 290 (H) 313 (H)   Diabetes history: DM 2 Outpatient Diabetes medications: Metformin 500 mg bid Current orders for Inpatient glycemic control:  Novolog 0-15 units Q4 hours  Solumedrol 1000 mg Q24 hours  Inpatient Diabetes Program Recommendations:    -   start Semglee 10 units  Thanks,  Christena Deem RN, MSN, BC-ADM Inpatient Diabetes Coordinator Team Pager [phone] (8a-5p)

## 2023-03-25 NOTE — Progress Notes (Signed)
Bilateral lower extremity venous duplex has been completed. Preliminary results can be found in CV Proc through chart review.   03/25/23 1:15 PM Olen Cordial RVT

## 2023-03-25 NOTE — Progress Notes (Signed)
NAME:  Pauline Mitro, MRN:  [id number], DOB:  1975-10-18, LOS: 3 ADMISSION DATE:  03/22/2023, CONSULTATION DATE:  03/24/2023 REFERRING MD:  Caleb Popp, CHIEF COMPLAINT:  Acute hypoxemic respiratory failure   History of Present Illness:  47 year old male with PMH of T2DM, spinal cord angioma s/p resection 2015, and obesity who presented to the ED on 10/25 with 1 week of fevers and malaise and was admitted for DKA and severe AKI.  Presented febrile, tachycardic, hypertensive, tachypneic, and hypoxic responsive to 5 L nasal cannula with labs showing hyponatremia, anion gap metabolic acidosis with a gap of 24, creatinine 5.65 from a baseline of 1.1 (2021), leukocytosis of 24, hemoglobin 10.9, lactic acid 4.4, Pro-Cal 141.  He was found to have bilateral opacities chest x-ray and small right pleural effusion.  Treated for DKA and community-acquired pneumonia.  On 10/27 respiratory status worsened and patient was intubated and transferred to the ICU.  Pertinent  Medical History  T2DM Spinal Hemangioma s/p excision 2015  Significant Hospital Events: Including procedures, antibiotic start and stop dates in addition to other pertinent events   10/25: Admitted with DKA, AKI, bilateral pneumonia w/ possible developing ARDS 10/27: Intubated for worsening respiratory status, bronchoscopy w/ alveolitis, POCUS TTE w/ normal RV function, renal ultrasound WNL.  Antibiotics broadened and pulsed dose steroids added  Interim History / Subjective:  Patient remains on the ventilator but is awake and alert.  Follows commands.  No pain in abdomen or lower extremities.  Objective   Blood pressure 105/77, pulse 95, temperature (!) 97.3 F (36.3 C), temperature source Bladder, resp. rate (!) 25, height 5\' 8"  (1.727 m), weight 86.3 kg, SpO2 94%. CVP:  [5 mmHg-17 mmHg] 5 mmHg  Vent Mode: PRVC FiO2 (%):  [40 %-100 %] 40 % Set Rate:  [16 bmp-22 bmp] 22 bmp Vt Set:  [540 mL-550 mL] 550 mL PEEP:  [5 cmH20-6 cmH20] 5  cmH20 Pressure Support:  [5 cmH20-8 cmH20] 5 cmH20 Plateau Pressure:  [23 cmH20-30 cmH20] 23 cmH20   Intake/Output Summary (Last 24 hours) at 03/25/2023 0643 Last data filed at 03/25/2023 8657 Gross per 24 hour  Intake 1321.06 ml  Output 1670 ml  Net -348.94 ml   Filed Weights   03/22/23 1122 03/22/23 2056  Weight: 86.2 kg 86.3 kg    Examination: Constitutional: intubated middle-age male. HENT: Normocephalic, atraumatic,  Eyes: Sclera non-icteric, moderately constricted pupils bilaterally, PERRL, EOM intact Cardio: Tachycardic with regular rhythm. No murmurs, rubs, or gallops. 2+ bilateral radial and dorsalis pedis  pulses. Pulm: Decreased breath sounds on the right compared to the left.  Intubated and ventilated Abdomen: Soft, non-tender, mildly distended, positive bowel sounds. QIO:[id number] for extremity edema. Skin:Warm and dry. Neuro:Alert and follows commands. No focal deficit noted.  Resolved Hospital Problem list   DKA  Assessment & Plan:  Acute hypoxemic respiratory failure with ARDS-likely 2/2 pneumonia.  Intubated 10/27, bronchoscopy with BAL concerning for alveolitis, initial CXR with bilateral infiltrates, stable on repeat chest x-ray from 10/28, PaO2/FiO2 initially 180 (up to 240), Pro-Cal 141-60, BNP 450, bedside TTE WNL Severe AKI, nonoliguric-creatinine 5.7 on admission down to 4.9.  Renal ultrasound without significant abnormality.  1700 mL of urine output over the last 24 hours.  Appreciate nephrology's input. 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  Currently pending autoimmune labs.  Empirically started pulsed dose steroids with methylprednisolone 1 g daily 10/27.  Will consider renal biopsy if  current workup unrevealing. Severe sepsis-likely 2/2 bilateral pneumonia.  Given 3 days of azithromycin and ceftriaxone prior to transfer to ICU.   Improving on vancomycin and Zosyn started 10/27.  BAL cultures preliminarily with without any organisms.  Pro-Cal and leukocytosis trending down without recurrence of fevers.  Admission blood cultures negative at 2 days.  MRSA nasal swab negative T2DM- A1c 8.1 on admission, treated for DKA on admission, rising glucoses with addition of steroids but beta hydroxybutyrate 0.61 this morning and not in DKA again Microcytic anemia-admitted with hemoglobin of 10.5 which is come down to 8.7 today.  With concern for vasculitis above we will further evaluate for hemolytic causes  Plan Continue on ventilator with VAP bundle Spontaneous breathing trials today, likely will need to stay on ventilator for another couple days Follow-up BAL labs Continue methylprednisolone 1 g daily for 3 doses (10/27 - 10/29) Follow-up lower extremity venous dopplers Continue strict ins and outs Follow autoimmune labs, consider renal biopsy if unrevealing Continue Zosyn and discontinue vancomycin, follow respiratory and blood cultures Start Semglee 10 units daily and resistant SSI, discontinue Endo tool orders and connected IV fluids RD consult for tube feeds Check LDH, iron/TIBC, reticulocytes, and blood smear  Follow BMP, Mg, Phos, CBC daily/bid  Best Practice (right click and "Reselect all SmartList Selections" daily)   Diet/type: tubefeeds DVT prophylaxis: prophylactic heparin  GI prophylaxis: H2B Lines: Central line Foley:  Yes, and it is still needed Code Status:  full code Last date of multidisciplinary goals of care discussion [03/25/2023]  Labs   CBC: Recent Labs  Lab 03/22/23 1124 03/22/23 1156 03/23/23 0656 03/24/23 0352 03/24/23 1516 03/24/23 1719 03/25/23 0144 03/25/23 0437  WBC 24.5*  --  20.3* 28.7*  --   --  16.8* 19.2*  NEUTROABS 22.7*  --   --   --   --   --   --   --   HGB 10.9*   < > 9.8* 9.6* 10.5* 9.2* 8.3* 8.7*  HCT 33.7*   < > 28.9* 29.2* 31.0* 27.0* 26.1* 27.0*  MCV 67.5*  --   65.5* 66.1*  --   --  67.8* 67.5*  PLT 187  --  193 234  --   --  237 252   < > = values in this interval not displayed.    Basic Metabolic Panel: Recent Labs  Lab 03/22/23 1806 03/22/23 2122 03/23/23 0312 03/23/23 0656 03/24/23 0352 03/24/23 1516 03/24/23 1719 03/25/23 0144 03/25/23 0437  NA 127* 129* 131* 132* 129* 131* 131* 130*  --   K 4.3 3.8 3.9 3.8 3.8 4.0 4.0 4.2  --   CL 91* 98 97* 97* 97*  --   --  95*  --   CO2 18* 19* 20* 21* 16*  --   --  18*  --   GLUCOSE 441* 235* 156* 153* 193*  --   --  332*  --   BUN 72* 69* 70* 72* 74*  --   --  91*  --   CREATININE 5.69* 5.42* 5.25* 5.27* 4.83*  --   --  4.72* 4.91*  CALCIUM 8.0* 7.4* 8.0* 7.8* 7.7*  --   --  7.3*  --   MG 2.4  --   --   --   --   --   --   --   --    GFR: Estimated Creatinine Clearance: 19.5 mL/min (A) (by C-G formula based on SCr of 4.91 mg/dL (H)). Recent Labs  Lab 03/22/23 1149 03/23/23 0656 03/23/23 1126 03/24/23 0352 03/25/23 0144 03/25/23 0437  PROCALCITON  --  141.68  --  82.45 58.82  --   WBC  --  20.3*  --  28.7* 16.8* 19.2*  LATICACIDVEN 4.4* 1.7 1.7  --   --   --     Liver Function Tests: Recent Labs  Lab 03/22/23 1124  AST 62*  ALT 33  ALKPHOS 85  BILITOT 1.8*  PROT 7.7  ALBUMIN 2.4*   No results for input(s): "LIPASE", "AMYLASE" in the last 168 hours. No results for input(s): "AMMONIA" in the last 168 hours.  ABG    Component Value Date/Time   PHART 7.290 (L) 03/24/2023 1719   PCO2ART 37.0 03/24/2023 1719   PO2ART 108 03/24/2023 1719   HCO3 18.0 (L) 03/24/2023 1719   TCO2 19 (L) 03/24/2023 1719   ACIDBASEDEF 8.0 (H) 03/24/2023 1719   O2SAT 98 03/24/2023 1719     Coagulation Profile: Recent Labs  Lab 03/23/23 0656  INR 1.3*    Cardiac Enzymes: Recent Labs  Lab 03/24/23 1830  CKTOTAL 377    HbA1C: Hgb A1c MFr Bld  Date/Time Value Ref Range Status  03/22/2023 06:06 PM 8.1 (H) 4.8 - 5.6 % Final    Comment:    (NOTE) Pre diabetes:           5.7%-6.4%  Diabetes:              >6.4%  Glycemic control for   <7.0% adults with diabetes   09/13/2019 02:26 AM 12.9 (H) 4.8 - 5.6 % Final    Comment:    (NOTE) Pre diabetes:          5.7%-6.4% Diabetes:              >6.4% Glycemic control for   <7.0% adults with diabetes     CBG: Recent Labs  Lab 03/24/23 1459 03/24/23 1801 03/24/23 1942 03/24/23 2308 03/25/23 0323  GLUCAP 229* 242* 250* 284* 290*    Review of Systems:   No pain.  Past Medical History:  He,  has a past medical history of Diabetes mellitus, type II (HCC) (2015) and Hemangioma of spine (2015).   Surgical History:   Past Surgical History:  Procedure Laterality Date   LAMINECTOMY N/A 03/22/2014   Procedure: THORACIC two - four  LAMINECTOMY FOR TUMOR;  Surgeon: Temple Pacini, MD;  Location: [facility];  Service: Neurosurgery;  Laterality: N/A;  Throacic two  - four laminectomy for tumor   LEG SURGERY       Social History:   reports that he has never smoked. He has never used smokeless tobacco. He reports current alcohol use. He reports that he does not use drugs.   Family History:  His family history includes Breast cancer in his mother.   Allergies Allergies  Allergen Reactions   Other Hives and Itching    Ketchup & Mustard     Home Medications  Prior to Admission medications   Medication Sig Start Date End Date Taking? Authorizing Provider  amLODipine (NORVASC) 5 MG tablet Take 5 mg by mouth daily.   Yes [provider]  metFORMIN (GLUCOPHAGE) 500 MG tablet Take 1 tablet (500 mg total) by mouth 2 (two) times daily with a meal. 09/14/19  Yes Leroy Sea, MD  blood glucose meter kit and supplies KIT Dispense based on patient and insurance preference. Use up to four times daily as directed. (FOR ICD-9 250.00, 250.01). For  QAC - HS accuchecks. 09/14/19   Leroy Sea, MD     Critical care time: 80    Rocky Morel, DO Internal Medicine Resident, PGY-2 Pager#  [phone] [facility] 03/25/2023 6:43 AM  For contact information, see Amion. If no response to pager, please call PCCM consult pager. After hours, 7PM- 7AM, please call Elink.

## 2023-03-25 NOTE — Progress Notes (Signed)
Nephrology Follow-Up Consult note   Assessment/Recommendations:  5M with renal failure of unclera chronicity, AHRF with concern for DAH based on bronchoscopy 10/27 and likely pneumonia; admit with DKA  Renal failure with unclear time course; nonoliguric Normal creatinine 2021 and then presented with creatinine of 5.7, 4.9 this morning.  Concerning for [facility] and possible small vessel vasculitis as below;  Not overtly nephritic presentation, but also not completely incompatible with it. I would expect more proteinuria and hematuria. Serologies are pending Started 3-day 1000 mg Solu-Medrol pulse 10/27 renal US shows no hydronephrosis, and likely foley balloon with decompressed bladder.  Might require kidney biopsy with stable No indication for dialysis  Anemia Transfuse for Hgb<7 g/dL Iron and TIBC ordered today by primary team  Uncontrolled Diabetes Mellitus Type 2 with Hyperglycemia; DKA at presentation -Managed by primary team  Sepsis from multilobar pneumonia on antibiotics currently intubated for respiratory distress with bloody BAL return with bronchoscope (serial lavage of LLL without clearning).  -Managed with abx by primary team. -Blood cultures no growth after 3 days.  Right side pleural effusion but not enough fluid on ultrasound to tap per VIR.    Recommendations conveyed to primary service.    Arita Miss, MD  [facility] 03/25/2023 9:38 AM  ___________________________________________________________  CC: Fevers  Interval History/Subjective:   Patient intubated and patient exam completed with pertinent history provided by critical care team. No significant overnight events. Patient with UOP that is yellow in color. Eyes remain closed during assessment.   Medications:  Current Facility-Administered Medications  Medication Dose Route Frequency Provider Last Rate Last Admin   acetaminophen (TYLENOL) tablet 650 mg  650 mg Oral Q6H PRN Synetta Fail, MD   650 mg at 03/23/23 0424   Or   acetaminophen (TYLENOL) suppository 650 mg  650 mg Rectal Q6H PRN Synetta Fail, MD       azithromycin (ZITHROMAX) 500 mg in sodium chloride 0.9 % 250 mL IVPB  500 mg Intravenous Q24H Synetta Fail, MD 250 mL/hr at 03/25/23 0920 500 mg at 03/25/23 0920   Chlorhexidine Gluconate Cloth 2 % PADS 6 each  6 each Topical Daily Lorin Glass, MD   6 each at 03/25/23 0914   dextrose 50 % solution 0-50 mL  0-50 mL Intravenous PRN Synetta Fail, MD       docusate (COLACE) 50 MG/5ML liquid 100 mg  100 mg Per Tube BID Lorin Glass, MD   100 mg at 03/25/23 0912   famotidine (PEPCID) tablet 10 mg  10 mg Per Tube Daily Rocky Morel, DO   10 mg at 03/25/23 0912   fentaNYL (SUBLIMAZE) bolus via infusion 50-100 mcg  50-100 mcg Intravenous Q15 min PRN Lorin Glass, MD   100 mcg at 03/24/23 2114   fentaNYL in NS (51mcg/ml) infusion-PREMIX  50-200 mcg/hr Intravenous Continuous Lorin Glass, MD   Stopped at 03/25/23 0719   heparin injection 5,000 Units  5,000 Units Subcutaneous Q8H Jeannette Corpus T, MD   5,000 Units at 03/25/23 0506   insulin aspart (novoLOG) injection 0-15 Units  0-15 Units Subcutaneous Q4H Darl Pikes, MD   11 Units at 03/25/23 0630   insulin glargine-yfgn (SEMGLEE) injection 10 Units  10 Units Subcutaneous Daily Rocky Morel, DO       ipratropium-albuterol (DUONEB) 0.5-2.5 (3) MG/3ML nebulizer solution 3 mL  3 mL Nebulization Q6H PRN Narda Bonds, MD       methylPREDNISolone sodium  succinate (SOLU-MEDROL) 1,000 mg in sodium chloride 0.9 % 50 mL IVPB  1,000 mg Intravenous Q24H Lorin Glass, MD   Paused at 03/24/23 1730   midazolam (VERSED) injection 2 mg  2 mg Intravenous Q4H PRN Lorin Glass, MD       Oral care mouth rinse  15 mL Mouth Rinse Q2H Lorin Glass, MD   15 mL at 03/25/23 0913   Oral care mouth rinse  15 mL Mouth Rinse PRN Lorin Glass, MD       piperacillin-tazobactam  (ZOSYN) IVPB 2.25 g  2.25 g Intravenous Q6H Doristine Counter, [place]   Stopped at 03/25/23 0744   polyethylene glycol (MIRALAX / GLYCOLAX) packet 17 g  17 g Oral Daily PRN Synetta Fail, MD       polyethylene glycol (MIRALAX / GLYCOLAX) packet 17 g  17 g Per Tube Daily Lorin Glass, MD   17 g at 03/25/23 0912   propofol (DIPRIVAN) 1000 MG/100ML infusion  0-50 mcg/kg/min Intravenous Continuous Lorin Glass, MD   Stopped at 03/25/23 0542   sodium chloride flush (NS) 0.9 % injection 3 mL  3 mL Intravenous Q12H Synetta Fail, MD   3 mL at 03/25/23 0913   zolpidem (AMBIEN) tablet 5 mg  5 mg Oral QHS PRN Hillary Bow, DO   5 mg at 03/24/23 0211      Review of Systems: 10 systems reviewed and negative except per interval history/subjective  Physical Exam: Vitals:   03/25/23 0830 03/25/23 0900  BP: 116/82 111/84  Pulse: 95 100  Resp: (!) 33 (!) 34  Temp:    SpO2: (!) 88% 94%   Total I/O In: 201.3 [I.V.:1.3; NG/GT:150; IV Piggyback:50] Out: 125 [Urine:125]  Intake/Output Summary (Last 24 hours) at 03/25/2023 2536 Last data filed at 03/25/2023 0920 Gross per 24 hour  Intake 1526.36 ml  Output 1795 ml  Net -268.64 ml   General appearance: Intubated on ventilator Head: Normocephalic, without obvious abnormality, atraumatic Eyes: negative Neck: no carotid bruit, no JVD, supple, symmetrical, trachea midline, symmetric, no tenderness/mass/nodules Resp: rales bilaterally to anterior exam and mechanical BS, tachypnea Cardio: Tachycardia and regular rhythm GI: soft, non-tender; bowel sounds normal; no masses,  no organomegaly Extremities: No LE edema Pulses: 2+ and symmetric PT and DP Skin: Skin color, turgor normal. Dry LE. No purpura, petehiae, rashes or ulcers noted.   Test Results I personally reviewed new and old clinical labs and radiology tests Lab Results  Component Value Date   NA 130 (L) 03/25/2023   K 4.2 03/25/2023   CL 95 (L) 03/25/2023   CO2 18 (L)  03/25/2023   BUN 91 (H) 03/25/2023   CREATININE 4.91 (H) 03/25/2023   CALCIUM 7.3 (L) 03/25/2023   ALBUMIN 2.4 (L) 03/22/2023    CBC Recent Labs  Lab 03/22/23 1124 03/22/23 1156 03/24/23 0352 03/24/23 1516 03/24/23 1719 03/25/23 0144 03/25/23 0437  WBC 24.5*   < > 28.7*  --   --  16.8* 19.2*  NEUTROABS 22.7*  --   --   --   --   --   --   HGB 10.9*   < > 9.6*   < > 9.2* 8.3* 8.7*  HCT 33.7*   < > 29.2*   < > 27.0* 26.1* 27.0*  MCV 67.5*   < > 66.1*  --   --  67.8* 67.5*  PLT 187   < > 234  --   --  237  252   < > = values in this interval not displayed.

## 2023-03-26 ENCOUNTER — Inpatient Hospital Stay (HOSPITAL_COMMUNITY): Payer: Self-pay

## 2023-03-26 LAB — GLUCOSE, CAPILLARY
Glucose-Capillary: 162 mg/dL — ABNORMAL HIGH (ref 70–99)
Glucose-Capillary: 169 mg/dL — ABNORMAL HIGH (ref 70–99)
Glucose-Capillary: 179 mg/dL — ABNORMAL HIGH (ref 70–99)
Glucose-Capillary: 181 mg/dL — ABNORMAL HIGH (ref 70–99)
Glucose-Capillary: 182 mg/dL — ABNORMAL HIGH (ref 70–99)
Glucose-Capillary: 184 mg/dL — ABNORMAL HIGH (ref 70–99)
Glucose-Capillary: 188 mg/dL — ABNORMAL HIGH (ref 70–99)
Glucose-Capillary: 192 mg/dL — ABNORMAL HIGH (ref 70–99)
Glucose-Capillary: 201 mg/dL — ABNORMAL HIGH (ref 70–99)
Glucose-Capillary: 240 mg/dL — ABNORMAL HIGH (ref 70–99)
Glucose-Capillary: 257 mg/dL — ABNORMAL HIGH (ref 70–99)
Glucose-Capillary: 268 mg/dL — ABNORMAL HIGH (ref 70–99)
Glucose-Capillary: 283 mg/dL — ABNORMAL HIGH (ref 70–99)
Glucose-Capillary: 286 mg/dL — ABNORMAL HIGH (ref 70–99)
Glucose-Capillary: 291 mg/dL — ABNORMAL HIGH (ref 70–99)
Glucose-Capillary: 293 mg/dL — ABNORMAL HIGH (ref 70–99)
Glucose-Capillary: 296 mg/dL — ABNORMAL HIGH (ref 70–99)
Glucose-Capillary: 297 mg/dL — ABNORMAL HIGH (ref 70–99)

## 2023-03-26 LAB — CBC
HCT: 27.9 % — ABNORMAL LOW (ref 39.0–52.0)
Hemoglobin: 9.4 g/dL — ABNORMAL LOW (ref 13.0–17.0)
MCH: 22.2 pg — ABNORMAL LOW (ref 26.0–34.0)
MCHC: 33.7 g/dL (ref 30.0–36.0)
MCV: 65.8 fL — ABNORMAL LOW (ref 80.0–100.0)
Platelets: 309 10*3/uL (ref 150–400)
RBC: 4.24 MIL/uL (ref 4.22–5.81)
RDW: 16.1 % — ABNORMAL HIGH (ref 11.5–15.5)
WBC: 38.3 10*3/uL — ABNORMAL HIGH (ref 4.0–10.5)
nRBC: 0.1 % (ref 0.0–0.2)

## 2023-03-26 LAB — BASIC METABOLIC PANEL
Anion gap: 18 — ABNORMAL HIGH (ref 5–15)
BUN: 110 mg/dL — ABNORMAL HIGH (ref 6–20)
CO2: 16 mmol/L — ABNORMAL LOW (ref 22–32)
Calcium: 7.8 mg/dL — ABNORMAL LOW (ref 8.9–10.3)
Chloride: 100 mmol/L (ref 98–111)
Creatinine, Ser: 4.86 mg/dL — ABNORMAL HIGH (ref 0.61–1.24)
GFR, Estimated: 14 mL/min — ABNORMAL LOW (ref >60)
Glucose, Bld: 319 mg/dL — ABNORMAL HIGH (ref 70–99)
Potassium: 3.4 mmol/L — ABNORMAL LOW (ref 3.5–5.1)
Sodium: 134 mmol/L — ABNORMAL LOW (ref 135–145)

## 2023-03-26 LAB — ANCA PROFILE
Anti-MPO Antibodies: <0.2 U (ref 0.0–0.9)
Anti-MPO Antibodies: <0.2 U (ref 0.0–0.9)
Anti-PR3 Antibodies: 0.6 U (ref 0.0–0.9)
Anti-PR3 Antibodies: 0.6 U (ref 0.0–0.9)
Atypical P-ANCA titer: <1:20 {titer}
Atypical P-ANCA titer: <1:20 {titer}
C-ANCA: <1:20 {titer}
C-ANCA: <1:20 {titer}
P-ANCA: <1:20 {titer}
P-ANCA: <1:20 {titer}

## 2023-03-26 LAB — ANTINUCLEAR ANTIBODIES, IFA: ANA Ab, IFA: NEGATIVE

## 2023-03-26 LAB — CYCLIC CITRUL PEPTIDE ANTIBODY, IGG/IGA: CCP Antibodies IgG/IgA: 6 U (ref 0–19)

## 2023-03-26 LAB — C3 COMPLEMENT
C3 Complement: 112 mg/dL (ref 82–167)
C3 Complement: 120 mg/dL (ref 82–167)

## 2023-03-26 LAB — C4 COMPLEMENT: Complement C4, Body Fluid: 36 mg/dL (ref 12–38)

## 2023-03-26 LAB — ANTI-DNA ANTIBODY, DOUBLE-STRANDED: ds DNA Ab: <1 [IU]/mL (ref 0–9)

## 2023-03-26 LAB — ANTI-SCLERODERMA ANTIBODY: Scleroderma (Scl-70) (ENA) Antibody, IgG: <0.2 AI (ref 0.0–0.9)

## 2023-03-26 LAB — ANTISTREPTOLYSIN O TITER: ASO: 25 [IU]/mL (ref 0.0–200.0)

## 2023-03-26 LAB — MAGNESIUM
Magnesium: 3.1 mg/dL — ABNORMAL HIGH (ref 1.7–2.4)
Magnesium: 3 mg/dL — ABNORMAL HIGH (ref 1.7–2.4)

## 2023-03-26 LAB — PHOSPHORUS
Phosphorus: 5.3 mg/dL — ABNORMAL HIGH (ref 2.5–4.6)
Phosphorus: 4.3 mg/dL (ref 2.5–4.6)

## 2023-03-26 LAB — RHEUMATOID FACTOR: Rheumatoid fact SerPl-aCnc: 23.9 [IU]/mL — ABNORMAL HIGH (ref <14.0)

## 2023-03-26 LAB — ALDOLASE: Aldolase: 18.2 U/L — ABNORMAL HIGH (ref 3.3–10.3)

## 2023-03-26 LAB — ANA W/REFLEX IF POSITIVE: Anti Nuclear Antibody (ANA): NEGATIVE

## 2023-03-26 LAB — GLOMERULAR BASEMENT MEMBRANE ANTIBODIES: GBM Ab: <0.2 U (ref 0.0–0.9)

## 2023-03-26 MED ORDER — INSULIN GLARGINE-YFGN 100 UNIT/ML ~~LOC~~ SOLN
20.0000 [IU] | Freq: Every day | SUBCUTANEOUS | Status: DC
  Administered 2023-03-26: 20 [IU] via SUBCUTANEOUS
  Filled 2023-03-26: qty 0.2

## 2023-03-26 MED ORDER — FUROSEMIDE 10 MG/ML IJ SOLN
120.0000 mg | Freq: Once | INTRAVENOUS | Status: AC
  Administered 2023-03-26: 120 mg via INTRAVENOUS
  Filled 2023-03-26: qty 10

## 2023-03-26 MED ORDER — DEXMEDETOMIDINE HCL IN NACL 400 MCG/100ML IV SOLN
0.0000 ug/kg/h | INTRAVENOUS | Status: DC
  Administered 2023-03-26: 0.4 ug/kg/h via INTRAVENOUS
  Administered 2023-03-26: 0.6 ug/kg/h via INTRAVENOUS
  Administered 2023-03-27: 0.7 ug/kg/h via INTRAVENOUS
  Administered 2023-03-27: 0.6 ug/kg/h via INTRAVENOUS
  Filled 2023-03-26 (×4): qty 100

## 2023-03-26 MED ORDER — INSULIN REGULAR(HUMAN) IN NACL 100-0.9 UT/100ML-% IV SOLN
INTRAVENOUS | Status: DC
  Administered 2023-03-26: 13 [IU]/h via INTRAVENOUS
  Administered 2023-03-27: 4.2 [IU]/h via INTRAVENOUS
  Filled 2023-03-26 (×2): qty 100

## 2023-03-26 MED ORDER — POTASSIUM CHLORIDE 20 MEQ PO PACK
20.0000 meq | PACK | Freq: Once | ORAL | Status: AC
  Administered 2023-03-26: 20 meq
  Filled 2023-03-26: qty 1

## 2023-03-26 MED ORDER — INSULIN ASPART 100 UNIT/ML IJ SOLN
3.0000 [IU] | Freq: Once | INTRAMUSCULAR | Status: AC
  Administered 2023-03-26: 3 [IU] via SUBCUTANEOUS

## 2023-03-26 MED ORDER — POTASSIUM CHLORIDE 20 MEQ PO PACK
40.0000 meq | PACK | Freq: Once | ORAL | Status: AC
  Administered 2023-03-26: 40 meq
  Filled 2023-03-26: qty 2

## 2023-03-26 MED ORDER — DEXTROSE 50 % IV SOLN: 0.0000 mL | INTRAVENOUS | Status: DC | PRN

## 2023-03-26 NOTE — Progress Notes (Signed)
NAME:  Gary Conway, MRN:  [id number], DOB:  20-Jun-1975, LOS: 4 ADMISSION DATE:  03/22/2023, CONSULTATION DATE:  03/24/2023 REFERRING MD:  Caleb Popp, CHIEF COMPLAINT:  Acute hypoxemic respiratory failure   History of Present Illness:  47 year old male with PMH of T2DM, spinal cord angioma s/p resection 2015, and obesity who presented to the ED on 10/25 with 1 week of fevers and malaise and was admitted for DKA and severe AKI.  Presented febrile, tachycardic, hypertensive, tachypneic, and hypoxic responsive to 5 L nasal cannula with labs showing hyponatremia, anion gap metabolic acidosis with a gap of 24, creatinine 5.65 from a baseline of 1.1 (2021), leukocytosis of 24, hemoglobin 10.9, lactic acid 4.4, Pro-Cal 141.  He was found to have bilateral opacities chest x-ray and small right pleural effusion.  Treated for DKA and community-acquired pneumonia.  On 10/27 respiratory status worsened and patient was intubated and transferred to the ICU.  Pertinent  Medical History  T2DM Spinal Hemangioma s/p excision 2015  Significant Hospital Events: Including procedures, antibiotic start and stop dates in addition to other pertinent events   10/25: Admitted with DKA, AKI, bilateral pneumonia w/ possible developing ARDS 10/27: Intubated for worsening respiratory status, bronchoscopy w/ alveolitis, POCUS TTE w/ normal RV function, renal ultrasound WNL.  Antibiotics broadened and pulsed dose steroids added 10/28: passing SBTs  Interim History / Subjective:  Stable this morning. Awake and alert on the ventilator (PS/CPAP). Not in pain. Answers questions in writing.  Objective   Blood pressure 110/78, pulse 97, temperature (!) 97.3 F (36.3 C), temperature source Bladder, resp. rate (!) 21, height 5\' 8"  (1.727 m), weight 86.3 kg, SpO2 95%. CVP:  [5 mmHg-9 mmHg] 9 mmHg  Vent Mode: PRVC FiO2 (%):  [40 %] 40 % Set Rate:  [22 bmp] 22 bmp Vt Set:  [550 mL] 550 mL PEEP:  [5 cmH20] 5 cmH20 Plateau  Pressure:  [20 cmH20-28 cmH20] 20 cmH20   Intake/Output Summary (Last 24 hours) at 03/26/2023 4098 Last data filed at 03/26/2023 0500 Gross per 24 hour  Intake 1294.06 ml  Output 615 ml  Net 679.06 ml   Filed Weights   03/22/23 1122 03/22/23 2056  Weight: 86.2 kg 86.3 kg    Examination: Constitutional: intubated middle-age male. HENT: Normocephalic, atraumatic,  Eyes: Sclera non-icteric, moderately constricted pupils bilaterally, PERRL, EOM intact Cardio: Tachycardic with regular rhythm. No murmurs, rubs, or gallops. 2+ bilateral radial and dorsalis pedis  pulses. Pulm: Decreased breath sounds on the right compared to the left.  Intubated and ventilated Abdomen: Soft, non-tender, mildly distended, positive bowel sounds. JXB:JYNWGNFA for extremity edema. Skin:Warm and dry. Neuro:Alert and follows commands. No focal deficit noted.  Resolved Hospital Problem list   DKA  Assessment & Plan:  Acute hypoxemic respiratory failure with ARDS and DAH-likely 2/2 pneumonia vs pulmonary renal syndrome.  Intubated 10/27, bronchoscopy with BAL consistent with DAH, BAL analysis with neutrophil predominance, initial CXR with bilateral infiltrates, stable on repeat chest x-ray from 10/28, PaO2/FiO2 initially 180 (up to 240), Pro-Cal 141-60, BNP 450, TTE without significant abnormality Severe AKI, nonoliguric-creatinine 5.7 on admission down to 4.9 and stable over 2 days, with rising BUN to 110.  Renal ultrasound without significant abnormality.  1200 mL of urine output over the last 24 hours. Will discuss diuresis with nephrology prior to extubation. 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  Currently  pending ANCA and antiGBM but otherwise RF is elevated.  Empirically started pulsed dose steroids with  methylprednisolone 1 g daily for 3 days on 10/27.  Will consider renal biopsy if current workup unrevealing. Severe sepsis-likely 2/2 bilateral pneumonia.  Given 3 days of azithromycin and ceftriaxone prior to transfer to ICU.  Improved on vancomycin and Zosyn started 10/27.  BAL cultures preliminarily with without any organisms.  Pro-Cal and leukocytosis trending down without recurrence of fevers.  Admission blood cultures negative at 3 days.  MRSA nasal swab negative, vancomycin removed. T2DM- A1c 8.1 on admission, treated for DKA on admission, difficult to control sugar on high dose steroids with plans for long term steroid taper Microcytic anemia-admitted with hemoglobin of 10.5, down to 8.7, and stable at 9.4 today. LDH elevated at 240 but blood smear normal, reticulocyte index low, and iron labs consistent with inflammatory anemia (could be IDA with ferritin elevated as acute phase reactant)  Plan Continue on ventilator with VAP bundle Spontaneous breathing trial successful today, likely will need to stay on ventilator until cause is determined, hopefully tomorrow (pending autoimmune labs) (RF high, pending GBM/anca) Discuss diuresis with nephrology Continue methylprednisolone 1 g daily for 3 doses (10/27 - 10/29), transition to prednisone 80 mg daily with plans to slowly taper Continue strict ins and outs Follow autoimmune labs, consider renal biopsy if unrevealing Continue Zosyn, follow respiratory and blood cultures Insulin drip Follow BMP, Mg, Phos, CBC daily/bid  Best Practice (right click and "Reselect all SmartList Selections" daily)   Diet/type: tubefeeds DVT prophylaxis: prophylactic heparin  GI prophylaxis: H2B Lines: Central line 10/27 Foley:  Yes, and it is still needed Code Status:  full code Last date of multidisciplinary goals of care discussion [03/26/2023]  Labs   CBC: Recent Labs  Lab 03/22/23 1124 03/22/23 1156 03/23/23 0656 03/24/23 0352 03/24/23 1516  03/24/23 1719 03/25/23 0144 03/25/23 0437 03/26/23 0335  WBC 24.5*  --  20.3* 28.7*  --   --  16.8* 19.8*  19.2* 38.3*  NEUTROABS 22.7*  --   --   --   --   --   --  18.5*  --   HGB 10.9*   < > 9.8* 9.6* 10.5* 9.2* 8.3* 8.6*  8.7* 9.4*  HCT 33.7*   < > 28.9* 29.2* 31.0* 27.0* 26.1* 27.1*  27.0* 27.9*  MCV 67.5*  --  65.5* 66.1*  --   --  67.8* 67.9*  67.5* 65.8*  PLT 187  --  193 234  --   --  237 PLATELET CLUMPS NOTED ON SMEAR, UNABLE TO ESTIMATE  252 309   < > = values in this interval not displayed.    Basic Metabolic Panel: Recent Labs  Lab 03/22/23 1806 03/22/23 2122 03/23/23 0312 03/23/23 0656 03/24/23 0352 03/24/23 1516 03/24/23 1719 03/25/23 0144 03/25/23 0437 03/25/23 1212 03/25/23 1738 03/26/23 0335  NA 127*   < > 131* 132* 129* 131* 131* 130*  --   --   --  134*  K 4.3   < > 3.9 3.8 3.8 4.0 4.0 4.2  --   --   --  3.4*  CL 91*   < > 97* 97* 97*  --   --  95*  --   --   --  100  CO2 18*   < > 20* 21* 16*  --   --  18*  --   --   --  16*  GLUCOSE 441*   < > 156* 153*  193*  --   --  332*  --   --   --  319*  BUN 72*   < > 70* 72* 74*  --   --  91*  --   --   --  110*  CREATININE 5.69*   < > 5.25* 5.27* 4.83*  --   --  4.72* 4.91*  --   --  4.86*  CALCIUM 8.0*   < > 8.0* 7.8* 7.7*  --   --  7.3*  --   --   --  7.8*  MG 2.4  --   --   --   --   --   --   --   --   --  2.8* 3.1*  PHOS  --   --   --   --   --   --   --   --   --  5.2* 5.1* 5.3*   < > = values in this interval not displayed.   GFR: Estimated Creatinine Clearance: 19.7 mL/min (A) (by C-G formula based on SCr of 4.86 mg/dL (H)). Recent Labs  Lab 03/22/23 1149 03/23/23 0656 03/23/23 1126 03/24/23 0352 03/25/23 0144 03/25/23 0437 03/26/23 0335  PROCALCITON  --  141.68  --  82.45 58.82  --   --   WBC  --  20.3*  --  28.7* 16.8* 19.8*  19.2* 38.3*  LATICACIDVEN 4.4* 1.7 1.7  --   --   --   --     Liver Function Tests: Recent Labs  Lab 03/22/23 1124  AST 62*  ALT 33  ALKPHOS 85   BILITOT 1.8*  PROT 7.7  ALBUMIN 2.4*   No results for input(s): "LIPASE", "AMYLASE" in the last 168 hours. No results for input(s): "AMMONIA" in the last 168 hours.  ABG    Component Value Date/Time   PHART 7.290 (L) 03/24/2023 1719   PCO2ART 37.0 03/24/2023 1719   PO2ART 108 03/24/2023 1719   HCO3 18.0 (L) 03/24/2023 1719   TCO2 19 (L) 03/24/2023 1719   ACIDBASEDEF 8.0 (H) 03/24/2023 1719   O2SAT 98 03/24/2023 1719     Coagulation Profile: Recent Labs  Lab 03/23/23 0656  INR 1.3*    Cardiac Enzymes: Recent Labs  Lab 03/24/23 1830  CKTOTAL 377    HbA1C: Hgb A1c MFr Bld  Date/Time Value Ref Range Status  03/22/2023 06:06 PM 8.1 (H) 4.8 - 5.6 % Final    Comment:    (NOTE) Pre diabetes:          5.7%-6.4%  Diabetes:              >6.4%  Glycemic control for   <7.0% adults with diabetes   09/13/2019 02:26 AM 12.9 (H) 4.8 - 5.6 % Final    Comment:    (NOTE) Pre diabetes:          5.7%-6.4% Diabetes:              >6.4% Glycemic control for   <7.0% adults with diabetes     CBG: Recent Labs  Lab 03/25/23 2325 03/26/23 0037 03/26/23 0138 03/26/23 0237 03/26/23 0339  GLUCAP 301* 286* 296* 297* 291*    Review of Systems:   Unable to obtain.  Past Medical History:  He,  has a past medical history of Diabetes mellitus, type II (HCC) (2015) and Hemangioma of spine (2015).   Surgical History:   Past Surgical History:  Procedure Laterality Date   LAMINECTOMY N/A  03/22/2014   Procedure: THORACIC two - four  LAMINECTOMY FOR TUMOR;  Surgeon: Temple Pacini, MD;  Location: [facility];  Service: Neurosurgery;  Laterality: N/A;  Throacic two  - four laminectomy for tumor   LEG SURGERY       Social History:   reports that he has never smoked. He has never used smokeless tobacco. He reports current alcohol use. He reports that he does not use drugs.   Family History:  His family history includes Breast cancer in his mother.   Allergies Allergies   Allergen Reactions   Other Hives and Itching    Ketchup & Mustard     Home Medications  Prior to Admission medications   Medication Sig Start Date End Date Taking? Authorizing Provider  amLODipine (NORVASC) 5 MG tablet Take 5 mg by mouth daily.   Yes [provider]  metFORMIN (GLUCOPHAGE) 500 MG tablet Take 1 tablet (500 mg total) by mouth 2 (two) times daily with a meal. 09/14/19  Yes Leroy Sea, MD  blood glucose meter kit and supplies KIT Dispense based on patient and insurance preference. Use up to four times daily as directed. (FOR ICD-9 250.00, 250.01). For QAC - HS accuchecks. 09/14/19   Leroy Sea, MD     Critical care time: 11    Rocky Morel, DO Internal Medicine Resident, PGY-2 Pager# [phone] [facility] 03/26/2023 6:07 AM  For contact information, see Amion. If no response to pager, please call PCCM consult pager. After hours, 7PM- 7AM, please call Elink.

## 2023-03-26 NOTE — Inpatient Diabetes Management (Signed)
Inpatient Diabetes Program Recommendations  AACE/ADA: New Consensus Statement on Inpatient Glycemic Control (2015)  Target Ranges:  Prepandial:   less than 140 mg/dL      Peak postprandial:   less than 180 mg/dL (1-2 hours)      Critically ill patients:  140 - 180 mg/dL   Lab Results  Component Value Date   GLUCAP 293 (H) 03/26/2023   HGBA1C 8.1 (H) 03/22/2023    Review of Glycemic Control  Latest Reference Range & Units 03/26/23 00:37 03/26/23 01:38 03/26/23 02:37 03/26/23 03:39 03/26/23 07:12  Glucose-Capillary 70 - 99 mg/dL 098 (H) 119 (H) 147 (H) 291 (H) 293 (H)  Diabetes history: DM 2 Outpatient Diabetes medications: Metformin 500 mg bid Current orders for Inpatient glycemic control:  Novolog 0-20 units Q4 hours Semglee 20 units daily Vital 70 ml/hr Solumedrol 1000 mg daily (66 ml/hr)  Inpatient Diabetes Program Recommendations:    Note that basal insulin increased today.  Please consider adding Novolog tube feed coverage 3 units q 4 hours.   Thanks,  Beryl Meager, RN, BC-ADM Inpatient Diabetes Coordinator Pager [phone]  (8a-5p)

## 2023-03-26 NOTE — Progress Notes (Signed)
Nephrology Follow-Up Consult note   Assessment/Recommendations: Gary Conway is a/an 47 y.o. male with a past medical history significant for renal failure of unclear chronicity, AHRF with concern for DAH based on bronchoscopy findings 10/27 and likely PNA, and he was admitted for DKA.    Renal failure with unclear time course; Nonoliguric Normal creatinine 2021 and then presented with creatinine of 5.7, 4.86 this morning.  Presentation, symptoms and bronchoscopy results are concerning for [facility] and possible small vessel vasculitis, as below;  Not overtly nephritic presentation, but also not completely incompatible with it. I would expect more proteinuria and hematuria. Serologies are pending. Negative ANA resulted today.  Started 3-day 1000 mg Solu-Medrol pulse 10/27. Likely will continue with 60 mg PO prednisone treatment starting tomorrow.  Renal US shows no hydronephrosis, and likely foley balloon with decompressed bladder.  Might require kidney biopsy when patient is stable/transitioned from ICU to floor. No indication for dialysis at this time. Patient continues with urine output.   Hypokalemia:   -K of 3.4 today and potassium chloride provided by critical care team.  -Plan for diuresis today 120 mg IV lasix one time dose.   Anemia, inflammatory: Transfuse for Hgb<7 g/dL Iron and TIBC ordered by primary team with iron: 6, TIBC 155 and saturation ratio of 4. UIBC 149.   Uncontrolled Diabetes Mellitus Type 2 with Hyperglycemia; DKA at presentation -Managed by primary team   Sepsis from multilobar pneumonia on antibiotics currently intubated for respiratory distress with bloody BAL return with bronchoscope (serial lavage of LLL without clearning).  -Managed with abx by primary team. -Blood cultures no growth after 4 days.  -Plan to extubate patient tomorrow per critical care team.   Right side pleural effusion but not enough fluid on ultrasound to tap per VIR.      Recommendations conveyed to primary service.    Theador Hawthorne Yolanda Dockendorf PA student [facility] 03/26/2023 8:22 AM  ___________________________________________________________  CC: Fevers  Interval History/Subjective:   Patient intubated and patient exam completed with pertinent history provided by critical care team. No significant overnight events. Mits removed today. He continues with no pulmonary sputum collection by suction. Patient with UOP that is yellow in color. Patient opens eyes during assessment and able to nod in agreement. Critical care teams with plan to extubate patient tomorrow.  Medications:  Current Facility-Administered Medications  Medication Dose Route Frequency Provider Last Rate Last Admin   acetaminophen (TYLENOL) tablet 650 mg  650 mg Oral Q6H PRN Synetta Fail, MD   650 mg at 03/23/23 0424   Or   acetaminophen (TYLENOL) suppository 650 mg  650 mg Rectal Q6H PRN Synetta Fail, MD       azithromycin (ZITHROMAX) 500 mg in sodium chloride 0.9 % 250 mL IVPB  500 mg Intravenous Q24H Synetta Fail, MD   Stopped at 03/25/23 1020   Chlorhexidine Gluconate Cloth 2 % PADS 6 each  6 each Topical Daily Lorin Glass, MD   6 each at 03/25/23 0914   dextrose 50 % solution 0-50 mL  0-50 mL Intravenous PRN Synetta Fail, MD       docusate (COLACE) 50 MG/5ML liquid 100 mg  100 mg Per Tube BID Lorin Glass, MD   100 mg at 03/25/23 0912   famotidine (PEPCID) tablet 10 mg  10 mg Per Tube Daily Rocky Morel, DO   10 mg at 03/25/23 0912   feeding supplement (VITAL AF 1.2 CAL) liquid 1,000 mL  1,000 mL  Per Tube Continuous Charlott Holler, MD 40 mL/hr at 03/26/23 0800 Infusion Verify at 03/26/23 0800   fentaNYL (SUBLIMAZE) bolus via infusion 50-100 mcg  50-100 mcg Intravenous Q15 min PRN Lorin Glass, MD   50 mcg at 03/25/23 2332   fentaNYL in NS (42mcg/ml) infusion-PREMIX  50-200 mcg/hr Intravenous Continuous Lorin Glass, MD    Stopped at 03/26/23 0711   heparin injection 5,000 Units  5,000 Units Subcutaneous Q8H Darl Pikes, MD   5,000 Units at 03/26/23 6578   insulin aspart (novoLOG) injection 0-20 Units  0-20 Units Subcutaneous Q4H Charlott Holler, MD   11 Units at 03/26/23 0715   insulin glargine-yfgn (SEMGLEE) injection 20 Units  20 Units Subcutaneous Daily Rocky Morel, DO       ipratropium-albuterol (DUONEB) 0.5-2.5 (3) MG/3ML nebulizer solution 3 mL  3 mL Nebulization Q6H PRN Narda Bonds, MD       methylPREDNISolone sodium succinate (SOLU-MEDROL) 1,000 mg in sodium chloride 0.9 % 50 mL IVPB  1,000 mg Intravenous Q24H Lorin Glass, MD   Stopped at 03/25/23 1617   midazolam (VERSED) injection 2 mg  2 mg Intravenous Q4H PRN Lorin Glass, MD       Oral care mouth rinse  15 mL Mouth Rinse Q2H Lorin Glass, MD   15 mL at 03/26/23 4696   Oral care mouth rinse  15 mL Mouth Rinse PRN Lorin Glass, MD       piperacillin-tazobactam (ZOSYN) IVPB 2.25 g  2.25 g Intravenous Q6H Doristine Counter, [place]   Stopped at 03/26/23 0748   polyethylene glycol (MIRALAX / GLYCOLAX) packet 17 g  17 g Oral Daily PRN Synetta Fail, MD       polyethylene glycol (MIRALAX / GLYCOLAX) packet 17 g  17 g Per Tube Daily Lorin Glass, MD   17 g at 03/25/23 0912   sodium chloride flush (NS) 0.9 % injection 3 mL  3 mL Intravenous Q12H Synetta Fail, MD   3 mL at 03/25/23 2151   zolpidem (AMBIEN) tablet 5 mg  5 mg Oral QHS PRN Hillary Bow, DO   5 mg at 03/24/23 0211      Review of Systems: 10 systems reviewed and negative except per interval history/subjective  Physical Exam: Vitals:   03/26/23 0730 03/26/23 0800  BP:  (!) 124/93  Pulse: 99 100  Resp: (!) 23 (!) 25  Temp:    SpO2: 91% 90%   Total I/O In: 92 [I.V.:2; NG/GT:40; IV Piggyback:50] Out: -   Intake/Output Summary (Last 24 hours) at 03/26/2023 2952 Last data filed at 03/26/2023 0800 Gross per 24 hour  Intake 1420.6 ml  Output 1225 ml   Net 195.6 ml   General appearance: Intubated on ventilator Head: Normocephalic, without obvious abnormality, atraumatic Eyes: negative Neck: no carotid bruit, no JVD, supple, symmetrical, trachea midline, symmetric, no tenderness/mass/nodules Resp: rales bilaterally to anterior exam and mechanical BS Cardio: Tachycardia and regular rhythm GI: soft, non-tender; bowel sounds normal; no masses,  no organomegaly Extremities: No LE edema Pulses: 2+ and symmetric PT and DP Skin: Skin color, turgor normal. Dry LE. No purpura, petehiae, rashes or ulcers noted.   Test Results I personally reviewed new and old clinical labs and radiology tests Lab Results  Component Value Date   NA 134 (L) 03/26/2023   K 3.4 (L) 03/26/2023   CL 100 03/26/2023   CO2 16 (L) 03/26/2023   BUN 110 (  H) 03/26/2023   CREATININE 4.86 (H) 03/26/2023   CALCIUM 7.8 (L) 03/26/2023   ALBUMIN 2.4 (L) 03/22/2023   PHOS 5.3 (H) 03/26/2023    CBC Recent Labs  Lab 03/22/23 1124 03/22/23 1156 03/25/23 0144 03/25/23 0437 03/26/23 0335  WBC 24.5*   < > 16.8* 19.8*  19.2* 38.3*  NEUTROABS 22.7*  --   --  18.5*  --   HGB 10.9*   < > 8.3* 8.6*  8.7* 9.4*  HCT 33.7*   < > 26.1* 27.1*  27.0* 27.9*  MCV 67.5*   < > 67.8* 67.9*  67.5* 65.8*  PLT 187   < > 237 PLATELET CLUMPS NOTED ON SMEAR, UNABLE TO ESTIMATE  252 309   < > = values in this interval not displayed.

## 2023-03-27 LAB — CULTURE, BLOOD (ROUTINE X 2)
Culture: NO GROWTH
Culture: NO GROWTH
Special Requests: ADEQUATE

## 2023-03-27 LAB — GLUCOSE, CAPILLARY
Glucose-Capillary: 149 mg/dL — ABNORMAL HIGH (ref 70–99)
Glucose-Capillary: 159 mg/dL — ABNORMAL HIGH (ref 70–99)
Glucose-Capillary: 160 mg/dL — ABNORMAL HIGH (ref 70–99)
Glucose-Capillary: 184 mg/dL — ABNORMAL HIGH (ref 70–99)
Glucose-Capillary: 188 mg/dL — ABNORMAL HIGH (ref 70–99)
Glucose-Capillary: 189 mg/dL — ABNORMAL HIGH (ref 70–99)
Glucose-Capillary: 195 mg/dL — ABNORMAL HIGH (ref 70–99)
Glucose-Capillary: 198 mg/dL — ABNORMAL HIGH (ref 70–99)
Glucose-Capillary: 199 mg/dL — ABNORMAL HIGH (ref 70–99)
Glucose-Capillary: 200 mg/dL — ABNORMAL HIGH (ref 70–99)
Glucose-Capillary: 200 mg/dL — ABNORMAL HIGH (ref 70–99)
Glucose-Capillary: 202 mg/dL — ABNORMAL HIGH (ref 70–99)
Glucose-Capillary: 208 mg/dL — ABNORMAL HIGH (ref 70–99)
Glucose-Capillary: 215 mg/dL — ABNORMAL HIGH (ref 70–99)
Glucose-Capillary: 225 mg/dL — ABNORMAL HIGH (ref 70–99)
Glucose-Capillary: 435 mg/dL — ABNORMAL HIGH (ref 70–99)
Glucose-Capillary: 472 mg/dL — ABNORMAL HIGH (ref 70–99)
Glucose-Capillary: 477 mg/dL — ABNORMAL HIGH (ref 70–99)

## 2023-03-27 LAB — RENAL FUNCTION PANEL
Albumin: 1.6 g/dL — ABNORMAL LOW (ref 3.5–5.0)
Anion gap: 16 — ABNORMAL HIGH (ref 5–15)
BUN: 140 mg/dL — ABNORMAL HIGH (ref 6–20)
CO2: 17 mmol/L — ABNORMAL LOW (ref 22–32)
Calcium: 7.6 mg/dL — ABNORMAL LOW (ref 8.9–10.3)
Chloride: 110 mmol/L (ref 98–111)
Creatinine, Ser: 4.72 mg/dL — ABNORMAL HIGH (ref 0.61–1.24)
GFR, Estimated: 14 mL/min — ABNORMAL LOW (ref >60)
Glucose, Bld: 209 mg/dL — ABNORMAL HIGH (ref 70–99)
Phosphorus: 4.2 mg/dL (ref 2.5–4.6)
Potassium: 3.3 mmol/L — ABNORMAL LOW (ref 3.5–5.1)
Sodium: 143 mmol/L (ref 135–145)

## 2023-03-27 LAB — BASIC METABOLIC PANEL
Anion gap: 16 — ABNORMAL HIGH (ref 5–15)
BUN: 138 mg/dL — ABNORMAL HIGH (ref 6–20)
CO2: 18 mmol/L — ABNORMAL LOW (ref 22–32)
Calcium: 7.7 mg/dL — ABNORMAL LOW (ref 8.9–10.3)
Chloride: 109 mmol/L (ref 98–111)
Creatinine, Ser: 5.06 mg/dL — ABNORMAL HIGH (ref 0.61–1.24)
GFR, Estimated: 13 mL/min — ABNORMAL LOW (ref >60)
Glucose, Bld: 160 mg/dL — ABNORMAL HIGH (ref 70–99)
Potassium: 3.2 mmol/L — ABNORMAL LOW (ref 3.5–5.1)
Sodium: 143 mmol/L (ref 135–145)

## 2023-03-27 LAB — PROTEIN ELECTROPHORESIS, SERUM
A/G Ratio: 0.5 — ABNORMAL LOW (ref 0.7–1.7)
Albumin ELP: 1.6 g/dL — ABNORMAL LOW (ref 2.9–4.4)
Alpha-1-Globulin: 0.5 g/dL — ABNORMAL HIGH (ref 0.0–0.4)
Alpha-2-Globulin: 1 g/dL (ref 0.4–1.0)
Beta Globulin: 0.7 g/dL (ref 0.7–1.3)
Gamma Globulin: 1.1 g/dL (ref 0.4–1.8)
Globulin, Total: 3.2 g/dL (ref 2.2–3.9)
Total Protein ELP: 4.8 g/dL — ABNORMAL LOW (ref 6.0–8.5)

## 2023-03-27 LAB — CBC
HCT: 26.4 % — ABNORMAL LOW (ref 39.0–52.0)
Hemoglobin: 8.6 g/dL — ABNORMAL LOW (ref 13.0–17.0)
MCH: 21.7 pg — ABNORMAL LOW (ref 26.0–34.0)
MCHC: 32.6 g/dL (ref 30.0–36.0)
MCV: 66.5 fL — ABNORMAL LOW (ref 80.0–100.0)
Platelets: 269 10*3/uL (ref 150–400)
RBC: 3.97 MIL/uL — ABNORMAL LOW (ref 4.22–5.81)
RDW: 16.2 % — ABNORMAL HIGH (ref 11.5–15.5)
WBC: 32.6 10*3/uL — ABNORMAL HIGH (ref 4.0–10.5)
nRBC: 0.4 % — ABNORMAL HIGH (ref 0.0–0.2)

## 2023-03-27 LAB — PHOSPHORUS: Phosphorus: 4.9 mg/dL — ABNORMAL HIGH (ref 2.5–4.6)

## 2023-03-27 LAB — CULTURE, RESPIRATORY W GRAM STAIN: Culture: NO GROWTH

## 2023-03-27 LAB — MAGNESIUM: Magnesium: 2.9 mg/dL — ABNORMAL HIGH (ref 1.7–2.4)

## 2023-03-27 MED ORDER — INSULIN GLARGINE-YFGN 100 UNIT/ML ~~LOC~~ SOLN
10.0000 [IU] | Freq: Every day | SUBCUTANEOUS | Status: DC
  Administered 2023-03-27: 10 [IU] via SUBCUTANEOUS
  Filled 2023-03-27 (×2): qty 0.1

## 2023-03-27 MED ORDER — DEXTROSE 50 % IV SOLN: 0.0000 mL | INTRAVENOUS | Status: DC | PRN

## 2023-03-27 MED ORDER — INSULIN REGULAR(HUMAN) IN NACL 100-0.9 UT/100ML-% IV SOLN
INTRAVENOUS | Status: DC
Start: 2023-03-27 — End: 2023-03-28
  Administered 2023-03-27: 14 [IU]/h via INTRAVENOUS
  Administered 2023-03-28: 7 [IU]/h via INTRAVENOUS
  Filled 2023-03-27 (×2): qty 100

## 2023-03-27 MED ORDER — INSULIN ASPART 100 UNIT/ML IJ SOLN: 0.0000 [IU] | Freq: Three times a day (TID) | INTRAMUSCULAR | Status: DC

## 2023-03-27 MED ORDER — INSULIN GLARGINE-YFGN 100 UNIT/ML ~~LOC~~ SOLN
10.0000 [IU] | Freq: Every day | SUBCUTANEOUS | Status: DC
  Filled 2023-03-27: qty 0.1

## 2023-03-27 MED ORDER — METHYLPREDNISOLONE SODIUM SUCC 125 MG IJ SOLR
60.0000 mg | INTRAMUSCULAR | Status: DC
  Administered 2023-03-27 – 2023-03-28 (×2): 60 mg via INTRAVENOUS
  Filled 2023-03-27 (×2): qty 2

## 2023-03-27 MED ORDER — POTASSIUM CHLORIDE 20 MEQ PO PACK
20.0000 meq | PACK | Freq: Once | ORAL | Status: AC
  Administered 2023-03-27: 20 meq
  Filled 2023-03-27: qty 1

## 2023-03-27 MED ORDER — ORAL CARE MOUTH RINSE: 15.0000 mL | OROMUCOSAL | Status: DC | PRN

## 2023-03-27 MED ORDER — LABETALOL HCL 5 MG/ML IV SOLN
20.0000 mg | INTRAVENOUS | Status: DC | PRN
  Administered 2023-03-27: 20 mg via INTRAVENOUS
  Filled 2023-03-27: qty 4

## 2023-03-27 MED ORDER — AMLODIPINE BESYLATE 5 MG PO TABS
5.0000 mg | ORAL_TABLET | Freq: Every day | ORAL | Status: DC
  Administered 2023-03-27 – 2023-03-31 (×5): 5 mg via ORAL
  Filled 2023-03-27 (×5): qty 1

## 2023-03-27 MED ORDER — POTASSIUM CHLORIDE 10 MEQ/50ML IV SOLN
10.0000 meq | INTRAVENOUS | Status: AC
  Administered 2023-03-27 (×2): 10 meq via INTRAVENOUS
  Filled 2023-03-27 (×2): qty 50

## 2023-03-27 NOTE — Progress Notes (Signed)
Nutrition Follow-up  DOCUMENTATION CODES:  Not applicable  INTERVENTION:  Monitor for diet advancement If pt unable to pass swallow evaluation, recommend the following: Vital AF at 70 ml/h (1680 ml per day) Provides 2016 kcal, 126 gm protein, 1362 ml free water daily  NUTRITION DIAGNOSIS:  Inadequate oral intake related to inability to eat as evidenced by NPO status. - remains applicable  GOAL:  Patient will meet greater than or equal to 90% of their needs - progressing, being met with TF at goal  MONITOR:  TF tolerance, I & O's, Labs, Weight trends, Vent status  REASON FOR ASSESSMENT:  Ventilator, Consult Enteral/tube feeding initiation and management  ASSESSMENT:  Pt with hx of DM type 2 presented to ED with ongoing fevers and was found to have sepsis secondary to pneumonia.   10/27 - bronchoscopy, intubated  10/28 - TF initiated 10/30 - extubated  Pt still intubated this AM, but with plans to extubate after rounds. TF were held after pt had emesis but are now being titrated back up to goal. Currently running at 60. Will monitor for diet advancement after swallow screen. TF recommendations above if needed in the event pt cannot swallow.   Admit weight: 86.3 kg Current weight: 89 kg   Intake/Output Summary (Last 24 hours) at 03/27/2023 0832 Last data filed at 03/27/2023 1027 Gross per 24 hour  Intake 1861.15 ml  Output 2790 ml  Net -928.85 ml  Net IO Since Admission: -766.11 mL [03/27/23 0832]  Nutritionally Relevant Medications: Scheduled Meds:  docusate  100 mg Per Tube BID   famotidine  10 mg Per Tube Daily   polyethylene glycol  17 g Per Tube Daily   potassium chloride  20 mEq Per Tube Once   Continuous Infusions:  dexmedetomidine (PRECEDEX) IV infusion 0.7 mcg/kg/hr (03/27/23 0627)   feeding supplement (VITAL AF 1.2 CAL) 60 mL/hr at 03/27/23 0600   insulin 3 Units/hr (03/27/23 0600)   piperacillin-tazobactam (ZOSYN)  IV Stopped (03/27/23 0243)    potassium chloride 10 mEq (03/27/23 0738)   PRN Meds: polyethylene glycol  Labs Reviewed: Na 130, chloride 95 BUN 91, creatinine 4.91 CBG ranges from 197-313 mg/dL over the last 24 hours HgbA1c 8.1%  NUTRITION - FOCUSED PHYSICAL EXAM: Flowsheet Row Most Recent Value  Orbital Region No depletion  Upper Arm Region No depletion  Thoracic and Lumbar Region No depletion  Buccal Region No depletion  Temple Region No depletion  Clavicle Bone Region No depletion  Clavicle and Acromion Bone Region No depletion  Scapular Bone Region No depletion  Dorsal Hand Unable to assess  [mittens]  Patellar Region No depletion  Anterior Thigh Region No depletion  Posterior Calf Region No depletion  Edema (RD Assessment) None  Hair Reviewed  Eyes Reviewed  Mouth Reviewed  Skin Reviewed  Nails Reviewed   Diet Order:   Diet Order     None       EDUCATION NEEDS:  Not appropriate for education at this time  Skin:  Skin Assessment: Reviewed RN Assessment  Last BM:  10/30 - type 7  Height:  Ht Readings from Last 1 Encounters:  03/22/23 5\' 8"  (1.727 m)   Weight:  Wt Readings from Last 1 Encounters:  03/26/23 89 kg    Ideal Body Weight:  70 kg  BMI:  Body mass index is 29.83 kg/m.  Estimated Nutritional Needs:  Kcal:  2000-2200 kcal/d Protein:  105-120 g/d Fluid:  2L    Greig Castilla, RD, LDN Clinical Dietitian RD  pager # available in [facility]  After hours/weekend pager # available in [facility]

## 2023-03-27 NOTE — Progress Notes (Signed)
IR aware of request for random renal biopsy as part of workup. Chart reviewed. Sepsis 2/2 PNA WBC spike to 38k but also on steroids, now with slight downtrend. Pt currently intubated but anticipate extubation, possible later today.  IR will follow along and plan renal biopsy accordingly with pt stability.  Brayton El PA-C Interventional Radiology 03/27/2023 3:27 PM

## 2023-03-27 NOTE — Progress Notes (Signed)
Pharmacy Antibiotic Note  Gary Conway is a 47 y.o. male admitted on 03/22/2023 with sepsis secondary to aspiration pneumonia. Patient presented with hyperglycemia and AKI. Pharmacy has been consulted for Zosyn (piperacillin-tazobactam) dosing.  WBC up to 32.6 (likely from 1000 mg methylprednisolone x3 days), Tmax 98.4 SCr @ 5.06, good urine output with Lasix 120 mg yesterday (unknown baseline, last Scr ~1 from 2021)  Plan: Continue Zosyn 2.25g IV q6h for CrCl <20 Monitor daily CBC, temp, SCr, and for clinical signs of improvement  F/u cultures and de-escalate antibiotics as able   Height: 5\' 8"  (172.7 cm) Weight: 89 kg (196 lb 3.4 oz) IBW/kg (Calculated) : 68.4  Temp (24hrs), Avg:98.1 F (36.7 C), Min:97.3 F (36.3 C), Max:99 F (37.2 C)  Recent Labs  Lab 03/22/23 1149 03/22/23 1806 03/23/23 0656 03/23/23 1126 03/24/23 0352 03/25/23 0144 03/25/23 0437 03/26/23 0335 03/27/23 0433  WBC  --   --  20.3*  --  28.7* 16.8* 19.8*  19.2* 38.3* 32.6*  CREATININE  --    < > 5.27*  --  4.83* 4.72* 4.91* 4.86* 5.06*  LATICACIDVEN 4.4*  --  1.7 1.7  --   --   --   --   --    < > = values in this interval not displayed.    Estimated Creatinine Clearance: 19.1 mL/min (A) (by C-G formula based on SCr of 5.06 mg/dL (H)).    Allergies  Allergen Reactions   Other Hives and Itching    Ketchup & Mustard    Antimicrobials this admission: Azithromycin 10/25 >> 10/29 Ceftriaxone 10/25 >> 10/27 Zosyn 10/27 >>  Vancomycin 10/27 >> 10/28  Dose adjustments this admission: N/A  Microbiology results: 10/25 BCx: NGTD x5d 10/27 BAL Cx: NGTD x 2d 10/27 MRSA PCR: negative  Thank you for allowing pharmacy to be a part of this patient's care.  Cedric Fishman, PharmD, BCPS, BCCCP Clinical Pharmacist   Please refer to [facility] for pharmacy phone number

## 2023-03-27 NOTE — Evaluation (Signed)
Physical Therapy Evaluation Patient Details Name: Gary Conway MRN: [id number] DOB: Aug 27, 1975 Today's Date: 03/27/2023  History of Present Illness  Pt is a 47 y/o male presenting to the ED on 10 25 with 1 week of fevers and malaise and was admitted for DKA and severe AKI.  Imaging found bil opacities and a small right pleual effusion, so treated for bil PNA  Intubated 10/27,  extubated 10/30.  PMHx:  DMII, hemangioma of spine resected with laminectomy T2-4.  Clinical Impression  Pt admitted with/for s/s of bil PNA.  Pt is needing CG to min assist for basic mobility at time of the evaluation.  Pt currently limited functionally due to the problems listed below.  (see problems list.)  Pt will benefit from PT to maximize function and safety to be able to get home safely with available assist .         If plan is discharge home, recommend the following: A little help with walking and/or transfers;A little help with bathing/dressing/bathroom;Assistance with cooking/housework;Assist for transportation;Help with stairs or ramp for entrance   Can travel by private vehicle        Equipment Recommendations Other (comment) (TBD)  Recommendations for Other Services       Functional Status Assessment Patient has had a recent decline in their functional status and demonstrates the ability to make significant improvements in function in a reasonable and predictable amount of time.     Precautions / Restrictions Precautions Precautions: None      Mobility  Bed Mobility Overal bed mobility: Needs Assistance Bed Mobility: Supine to Sit, Sit to Supine     Supine to sit: Min assist Sit to supine: Min assist   General bed mobility comments: minimal assist LE's and line management    Transfers Overall transfer level: Needs assistance   Transfers: Sit to/from Stand Sit to Stand: Contact guard assist           General transfer comment: cues for hand placement    Ambulation/Gait                General Gait Details: NT due to fecal incontinence and lines not on IV poles  Stairs            Wheelchair Mobility     Tilt Bed    Modified Rankin (Stroke Patients Only)       Balance Overall balance assessment: Needs assistance Sitting-balance support: No upper extremity supported, Single extremity supported, Feet supported Sitting balance-Leahy Scale: Fair     Standing balance support: Single extremity supported, No upper extremity supported, During functional activity Standing balance-Leahy Scale: Fair                               Pertinent Vitals/Pain Pain Assessment Pain Assessment: Faces Faces Pain Scale: Hurts a little bit Pain Location: general, back, rectal pouch, foley Pain Descriptors / Indicators: Burning, Discomfort Pain Intervention(s): Monitored during session    Home Living Family/patient expects to be discharged to:: Unsure (Maybe back to friend's apartment) Living Arrangements: Alone   Type of Home: Apartment Home Access: Stairs to enter   Entrance Stairs-Number of Steps: several   Home Layout: One level Home Equipment: None      Prior Function Prior Level of Function : Independent/Modified Independent                     Extremity/Trunk Assessment   Upper Extremity Assessment  Upper Extremity Assessment: Overall WFL for tasks assessed (mild truncal and large muscle group weakness.)    Lower Extremity Assessment Lower Extremity Assessment: Generalized weakness;Overall [facility] for tasks assessed    Cervical / Trunk Assessment Cervical / Trunk Assessment: Normal  Communication   Communication Communication: No apparent difficulties  Cognition Arousal: Alert Behavior During Therapy: WFL for tasks assessed/performed Overall Cognitive Status: No family/caregiver present to determine baseline cognitive functioning                                          General Comments  General comments (skin integrity, edema, etc.): Sats on 6L [place] dropping into the low to mid 80's,  HR in the 90's,  Pt coughing up thicker secretions.  Incentive spirometer given, showed how to use.    Exercises     Assessment/Plan    PT Assessment Patient needs continued PT services  PT Problem List Decreased strength;Decreased activity tolerance;Decreased mobility;Decreased knowledge of use of DME;Cardiopulmonary status limiting activity       PT Treatment Interventions DME instruction;Gait training;Functional mobility training;Therapeutic activities;Patient/family education    PT Goals (Current goals can be found in the Care Plan section)  Acute Rehab PT Goals Patient Stated Goal: feel better and get out of here independent PT Goal Formulation: With patient Time For Goal Achievement: 04/10/23 Potential to Achieve Goals: Good    Frequency Min 1X/week     Co-evaluation               AM-PAC PT "6 Clicks" Mobility  Outcome Measure Help needed turning from your back to your side while in a flat bed without using bedrails?: A Little Help needed moving from lying on your back to sitting on the side of a flat bed without using bedrails?: A Little Help needed moving to and from a bed to a chair (including a wheelchair)?: A Little Help needed standing up from a chair using your arms (e.g., wheelchair or bedside chair)?: A Little Help needed to walk in hospital room?: A Lot Help needed climbing 3-5 steps with a railing? : A Lot 6 Click Score: 16    End of Session Equipment Utilized During Treatment: Oxygen Activity Tolerance: Patient tolerated treatment well;Patient limited by fatigue Patient left: in bed;with call bell/phone within reach Nurse Communication: Mobility status PT Visit Diagnosis: Other abnormalities of gait and mobility (R26.89)    Time: 1610-9604 PT Time Calculation (min) (ACUTE ONLY): 32 min   Charges:   PT Evaluation $PT Eval Moderate Complexity: 1  Mod PT Treatments $Therapeutic Activity: 8-22 mins PT General Charges $$ ACUTE PT VISIT: 1 Visit         03/27/2023  Jacinto Halim., PT Acute Rehabilitation Services [phone]  (office)  Eliseo Gum Darcella Shiffman 03/27/2023, 3:46 PM

## 2023-03-27 NOTE — Progress Notes (Signed)
NAME:  Gary Conway, MRN:  [id number], DOB:  1976/03/09, LOS: 5 ADMISSION DATE:  03/22/2023, CONSULTATION DATE:  03/24/2023 REFERRING MD:  Caleb Popp, CHIEF COMPLAINT:  Acute hypoxemic respiratory failure   History of Present Illness:  47 year old male with PMH of T2DM, spinal cord angioma s/p resection 2015, and obesity who presented to the ED on 10/25 with 1 week of fevers and malaise and was admitted for DKA and severe AKI.  Presented febrile, tachycardic, hypertensive, tachypneic, and hypoxic responsive to 5 L nasal cannula with labs showing hyponatremia, anion gap metabolic acidosis with a gap of 24, creatinine 5.65 from a baseline of 1.1 (2021), leukocytosis of 24, hemoglobin 10.9, lactic acid 4.4, Pro-Cal 141.  He was found to have bilateral opacities chest x-ray and small right pleural effusion.  Treated for DKA and community-acquired pneumonia.  On 10/27 respiratory status worsened and patient was intubated and transferred to the ICU.  Pertinent  Medical History  T2DM Spinal Hemangioma s/p excision 2015  Significant Hospital Events: Including procedures, antibiotic start and stop dates in addition to other pertinent events   10/25: Admitted with DKA, AKI, bilateral pneumonia w/ possible developing ARDS 10/27: Intubated for worsening respiratory status, bronchoscopy w/ alveolitis, POCUS TTE w/ normal RV function, renal ultrasound WNL.  Antibiotics broadened and pulsed dose steroids added 10/28: passing SBTs  Interim History / Subjective:  More sedated today but continues to do well on the ventilator.   Objective   Blood pressure 111/82, pulse 87, temperature 98.4 F (36.9 C), temperature source Bladder, resp. rate 20, height 5\' 8"  (1.727 m), weight 89 kg, SpO2 97%. CVP:  [9 mmHg] 9 mmHg  Vent Mode: PRVC FiO2 (%):  [40 %] 40 % Set Rate:  [22 bmp] 22 bmp Vt Set:  [550 mL] 550 mL PEEP:  [5 cmH20] 5 cmH20 Pressure Support:  [5 cmH20] 5 cmH20 Plateau Pressure:  [18 cmH20-36 cmH20]  18 cmH20   Intake/Output Summary (Last 24 hours) at 03/27/2023 0604 Last data filed at 03/27/2023 0500 Gross per 24 hour  Intake 1910.55 ml  Output 1515 ml  Net 395.55 ml   Filed Weights   03/22/23 1122 03/22/23 2056 03/26/23 0701  Weight: 86.2 kg 86.3 kg 89 kg    Examination: Constitutional: intubated middle-age male. HENT: Normocephalic, atraumatic,  Eyes: Sclera non-icteric, PERRL, EOM intact Cardio: regular rate and rhythm. 2+ bilateral radial and dorsalis pedis  pulses. Pulm: Decreased breath sounds on the right compared to the left.  Intubated and ventilated Abdomen: Soft, non-tender, mildly distended, positive bowel sounds. OZD:GUYQIHKV for extremity edema. Skin:Warm and dry. Neuro:Alert and follows commands. No focal deficit noted.  Resolved Hospital Problem list   DKA  Assessment & Plan:  Acute hypoxemic respiratory failure with ARDS and DAH-likely 2/2 pneumonia vs pulmonary renal syndrome (negative workup so far).  Intubated 10/27, bronchoscopy with BAL consistent with DAH, BAL analysis with neutrophil predominance, CXR with bilateral infiltrates, PaO2/FiO2 initially 180, Pro-Cal 141-60, BNP 450, TTE without significant abnormality. Passing SBTs Severe AKI, nonoliguric-creatinine 5.7 on admission down to 4.9-5 and stable over 3 days, BUN up to 138.  Renal ultrasound without significant abnormality. 2300 mL of urine output over the last 24 hours with 120 mg IV lasix.  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  Workup positive for elevated ESR and CRP as well as RF and aldolase but otherwise negative including ANCA and GBM.  Empirically started pulsed  dose steroids with methylprednisolone 1 g daily for 3 days on 10/27 completed 10/29.  Now on 60 mg IV methylprednisolone daily.  Will plan for renal biopsy. Severe sepsis-likely 2/2 bilateral pneumonia.  Given 3  days of azithromycin and ceftriaxone prior to transfer to ICU.  Improved on vancomycin and Zosyn started 10/27.  Vancomycin removed and continued on Zosyn and azithromycin.  BAL and blood cultures negative to date.  Pro-Cal and leukocytosis trending down without recurrence of fevers.  T2DM- A1c 8.1 on admission, treated for DKA on admission, difficult to control sugar on high dose steroids with plans for long term steroid taper Microcytic anemia-admitted with hemoglobin of 10.5, down to 8.7, and stable at 8.6 today. LDH elevated at 240 but blood smear normal, reticulocyte index low, and iron labs consistent with inflammatory anemia (could be IDA with ferritin elevated as acute phase reactant)  Plan Continue on ventilator with VAP bundle Plan for SBT today and possible extubation Completed methylprednisolone 1 g daily for 3 doses (10/27 - 10/29) Start methylprednisolone 60 mg IV daily  Renal biopsy Continue strict ins and outs Continue Zosyn, follow respiratory and blood cultures Insulin drip, transition to basal bolus when eating  Follow BMP, Mg, Phos, CBC daily/bid  Best Practice (right click and "Reselect all SmartList Selections" daily)   Diet/type: tubefeeds DVT prophylaxis: prophylactic heparin  GI prophylaxis: H2B Lines: Central line 10/27 Foley:  Yes, and it is still needed Code Status:  full code Last date of multidisciplinary goals of care discussion [03/27/2023]  Labs   CBC: Recent Labs  Lab 03/22/23 1124 03/22/23 1156 03/24/23 0352 03/24/23 1516 03/24/23 1719 03/25/23 0144 03/25/23 0437 03/26/23 0335 03/27/23 0433  WBC 24.5*   < > 28.7*  --   --  16.8* 19.8*  19.2* 38.3* 32.6*  NEUTROABS 22.7*  --   --   --   --   --  18.5*  --   --   HGB 10.9*   < > 9.6*   < > 9.2* 8.3* 8.6*  8.7* 9.4* 8.6*  HCT 33.7*   < > 29.2*   < > 27.0* 26.1* 27.1*  27.0* 27.9* 26.4*  MCV 67.5*   < > 66.1*  --   --  67.8* 67.9*  67.5* 65.8* 66.5*  PLT 187   < > 234  --   --  237  PLATELET CLUMPS NOTED ON SMEAR, UNABLE TO ESTIMATE  252 309 269   < > = values in this interval not displayed.    Basic Metabolic Panel: Recent Labs  Lab 03/22/23 1806 03/22/23 2122 03/23/23 0656 03/24/23 0352 03/24/23 1516 03/24/23 1719 03/25/23 0144 03/25/23 0437 03/25/23 1212 03/25/23 1738 03/26/23 0335 03/26/23 1702 03/27/23 0433  NA 127*   < > 132* 129* 131* 131* 130*  --   --   --  134*  --  143  K 4.3   < > 3.8 3.8 4.0 4.0 4.2  --   --   --  3.4*  --  3.2*  CL 91*   < > 97* 97*  --   --  95*  --   --   --  100  --  109  CO2 18*   < > 21* 16*  --   --  18*  --   --   --  16*  --  18*  GLUCOSE 441*   < > 153* 193*  --   --  332*  --   --   --  319*  --  160*  BUN 72*   < > 72* 74*  --   --  91*  --   --   --  110*  --  138*  CREATININE 5.69*   < > 5.27* 4.83*  --   --  4.72* 4.91*  --   --  4.86*  --  5.06*  CALCIUM 8.0*   < > 7.8* 7.7*  --   --  7.3*  --   --   --  7.8*  --  7.7*  MG 2.4  --   --   --   --   --   --   --   --  2.8* 3.1* 3.0* 2.9*  PHOS  --   --   --   --   --   --   --   --  5.2* 5.1* 5.3* 4.3 4.9*   < > = values in this interval not displayed.   GFR: Estimated Creatinine Clearance: 19.1 mL/min (A) (by C-G formula based on SCr of 5.06 mg/dL (H)). Recent Labs  Lab 03/22/23 1149 03/23/23 0656 03/23/23 1126 03/24/23 0352 03/25/23 0144 03/25/23 0437 03/26/23 0335 03/27/23 0433  PROCALCITON  --  141.68  --  82.45 58.82  --   --   --   WBC  --  20.3*  --  28.7* 16.8* 19.8*  19.2* 38.3* 32.6*  LATICACIDVEN 4.4* 1.7 1.7  --   --   --   --   --     Liver Function Tests: Recent Labs  Lab 03/22/23 1124  AST 62*  ALT 33  ALKPHOS 85  BILITOT 1.8*  PROT 7.7  ALBUMIN 2.4*   No results for input(s): "LIPASE", "AMYLASE" in the last 168 hours. No results for input(s): "AMMONIA" in the last 168 hours.  ABG    Component Value Date/Time   PHART 7.290 (L) 03/24/2023 1719   PCO2ART 37.0 03/24/2023 1719   PO2ART 108 03/24/2023 1719   HCO3 18.0 (L)  03/24/2023 1719   TCO2 19 (L) 03/24/2023 1719   ACIDBASEDEF 8.0 (H) 03/24/2023 1719   O2SAT 98 03/24/2023 1719     Coagulation Profile: Recent Labs  Lab 03/23/23 0656  INR 1.3*    Cardiac Enzymes: Recent Labs  Lab 03/24/23 1830  CKTOTAL 377    HbA1C: Hgb A1c MFr Bld  Date/Time Value Ref Range Status  03/22/2023 06:06 PM 8.1 (H) 4.8 - 5.6 % Final    Comment:    (NOTE) Pre diabetes:          5.7%-6.4%  Diabetes:              >6.4%  Glycemic control for   <7.0% adults with diabetes   09/13/2019 02:26 AM 12.9 (H) 4.8 - 5.6 % Final    Comment:    (NOTE) Pre diabetes:          5.7%-6.4% Diabetes:              >6.4% Glycemic control for   <7.0% adults with diabetes     CBG: Recent Labs  Lab 03/27/23 0131 03/27/23 0237 03/27/23 0344 03/27/23 0437 03/27/23 0533  GLUCAP 198* 200* 215* 149* 160*    Review of Systems:   Unable to obtain.  Past Medical History:  He,  has a past medical history of Diabetes mellitus, type II (HCC) (2015) and Hemangioma of spine (2015).   Surgical History:   Past Surgical History:  Procedure Laterality Date  LAMINECTOMY N/A 03/22/2014   Procedure: THORACIC two - four  LAMINECTOMY FOR TUMOR;  Surgeon: Temple Pacini, MD;  Location: [facility];  Service: Neurosurgery;  Laterality: N/A;  Throacic two  - four laminectomy for tumor   LEG SURGERY       Social History:   reports that he has never smoked. He has never used smokeless tobacco. He reports current alcohol use. He reports that he does not use drugs.   Family History:  His family history includes Breast cancer in his mother.   Allergies Allergies  Allergen Reactions   Other Hives and Itching    Ketchup & Mustard     Home Medications  Prior to Admission medications   Medication Sig Start Date End Date Taking? Authorizing Provider  amLODipine (NORVASC) 5 MG tablet Take 5 mg by mouth daily.   Yes [provider]  metFORMIN (GLUCOPHAGE) 500 MG tablet Take  1 tablet (500 mg total) by mouth 2 (two) times daily with a meal. 09/14/19  Yes Leroy Sea, MD  blood glucose meter kit and supplies KIT Dispense based on patient and insurance preference. Use up to four times daily as directed. (FOR ICD-9 250.00, 250.01). For QAC - HS accuchecks. 09/14/19   Leroy Sea, MD     Critical care time: 56    Rocky Morel, DO Internal Medicine Resident, PGY-2 Pager# [phone] [facility] 03/27/2023 6:04 AM  For contact information, see Amion. If no response to pager, please call PCCM consult pager. After hours, 7PM- 7AM, please call Elink.

## 2023-03-27 NOTE — Progress Notes (Signed)
eLink Physician-Brief Progress Note Patient Name: Gary Conway DOB: October 31, 1975 MRN: [id number]   Date of Service  03/27/2023  HPI/Events of Note  Blood sugar 477 mg / dl  eICU Interventions  Insulin gtt ordered.        Thomasene Lot Avyonna Wagoner 03/27/2023, 9:59 PM

## 2023-03-27 NOTE — Plan of Care (Signed)
  Problem: Fluid Volume: Goal: Hemodynamic stability will improve Outcome: Progressing   Problem: Clinical Measurements: Goal: Diagnostic test results will improve Outcome: Progressing Goal: Signs and symptoms of infection will decrease Outcome: Progressing   Problem: Respiratory: Goal: Ability to maintain adequate ventilation will improve Outcome: Progressing   Problem: Fluid Volume: Goal: Ability to maintain a balanced intake and output will improve Outcome: Progressing

## 2023-03-27 NOTE — Procedures (Signed)
Extubation Procedure Note  Patient Details:   Name: Gary Conway DOB: Mar 25, 1976 MRN: [id number]   Airway Documentation:    Vent end date: 03/27/23 Vent end time: 1204   Evaluation  O2 sats: stable throughout Complications: No apparent complications Patient did tolerate procedure well. Bilateral Breath Sounds: Rhonchi, Diminished   Yes, pt could speak post extubation.  Pt extubated to 6 l/m [employer] without difficulty.  [name] 03/27/2023, 12:04 PM

## 2023-03-27 NOTE — Progress Notes (Signed)
Nephrology Follow-Up Consult note   Assessment/Recommendations:   Gary Conway is a/an 48 y.o. male with a past medical history significant for renal failure of unclear chronicity, AHRF with concern for DAH based on bronchoscopy findings 10/27 and likely PNA, and he was admitted for DKA.    Renal failure with unclear time course; Nonoliguric Normal creatinine 2021 and then presented with creatinine of 5.7, 4.86, 5.06 this morning.  Presentation, symptoms and bronchoscopy results are concerning for [facility]. Serologies ordered for suspicion of small vessel vasculitis. Labs resulted including negative ANA, ds-DNA, C3 and C4, ANCA panel, and GBM all negative.  Not overtly nephritic presentation, but also not completely incompatible with it. I would expect more proteinuria and hematuria. Started 3-day 1000 mg Solu-Medrol pulse 10/27. With most serologies resulted as negative, no recommendations for continued steroids from a renal perspective. Pulm/critical care can determine if steroids should be continued from a pulmonary perspective. Renal US shows no hydronephrosis, and likely foley balloon with decompressed bladder.  Renal biopsy ordered today. The renal biopsy may not provide clear etiology, but will give insight into patient's overall renal function, scarring/fibrosis, etc.  Patient likely has some degree of CKD with unknown chronicity/baseline. No indication for dialysis at this time. Patient continues with urine output -928 mL. BUN likely elevated due to pulse steroids.  Okay with re-diuresis today 120 mg IV lasix since he had good UOP yesterday with this dose.    Hypokalemia:   -K of 3.2 today and potassium chloride provided by critical care team.    Anemia, inflammatory: Transfuse for Hgb<7 g/dL Iron and TIBC ordered by primary team with iron: 6, TIBC 155 and saturation ratio of 4. UIBC 149.   Uncontrolled Diabetes Mellitus Type 2 with Hyperglycemia; DKA at presentation -Managed by  primary team   Sepsis from multilobar pneumonia on antibiotics currently intubated for respiratory distress with bloody BAL return with bronchoscope (serial lavage of LLL without clearning).  -Managed with abx by primary team. -Blood cultures no growth after 5 days.  -Plan to extubate patient today after weaning off fentanyl, managed per critical care team.   Right side pleural effusion but not enough fluid on ultrasound to tap per VIR.      Recommendations conveyed to primary service.    Theador Hawthorne Andrus Sharp PA student [facility] 03/27/2023 8:12 AM  ___________________________________________________________  CC: Fevers  Interval History/Subjective:   Patient intubated and patient exam completed with pertinent history provided by critical care team. No significant overnight events. Yesterday was not able to be extubated. He continues with no pulmonary sputum collection by suction. Patient with UOP that is yellow in color. Patient opens eyes during assessment and able to nod in agreement. Critical care teams with plan to extubate patient today once he weans off of sedation.   Medications:  Current Facility-Administered Medications  Medication Dose Route Frequency Provider Last Rate Last Admin   acetaminophen (TYLENOL) tablet 650 mg  650 mg Oral Q6H PRN Synetta Fail, MD   650 mg at 03/23/23 0424   Or   acetaminophen (TYLENOL) suppository 650 mg  650 mg Rectal Q6H PRN Synetta Fail, MD       Chlorhexidine Gluconate Cloth 2 % PADS 6 each  6 each Topical Daily Lorin Glass, MD   6 each at 03/27/23 0430   dexmedetomidine (PRECEDEX) 400 MCG/100ML (4 mcg/mL) infusion  0-1.2 mcg/kg/hr Intravenous Titrated Rocky Morel, DO 15.58 mL/hr at 03/27/23 0627 0.7 mcg/kg/hr at 03/27/23 0627   dextrose  50 % solution 0-50 mL  0-50 mL Intravenous PRN Rocky Morel, DO       docusate (COLACE) 50 MG/5ML liquid 100 mg  100 mg Per Tube BID Lorin Glass, MD   100 mg at  03/26/23 2203   famotidine (PEPCID) tablet 10 mg  10 mg Per Tube Daily Rocky Morel, DO   10 mg at 03/26/23 0853   feeding supplement (VITAL AF 1.2 CAL) liquid 1,000 mL  1,000 mL Per Tube Continuous Charlott Holler, MD 60 mL/hr at 03/27/23 0600 Infusion Verify at 03/27/23 0600   fentaNYL (SUBLIMAZE) bolus via infusion 50-100 mcg  50-100 mcg Intravenous Q15 min PRN Lorin Glass, MD   100 mcg at 03/27/23 0415   fentaNYL in NS (34mcg/ml) infusion-PREMIX  50-200 mcg/hr Intravenous Continuous Lorin Glass, MD 10 mL/hr at 03/27/23 0737 100 mcg/hr at 03/27/23 0737   heparin injection 5,000 Units  5,000 Units Subcutaneous Q8H Jeannette Corpus T, MD   5,000 Units at 03/27/23 0535   insulin regular, human (MYXREDLIN) 100 units/ 100 mL infusion   Intravenous Continuous Rocky Morel, DO 3 mL/hr at 03/27/23 0600 3 Units/hr at 03/27/23 0600   ipratropium-albuterol (DUONEB) 0.5-2.5 (3) MG/3ML nebulizer solution 3 mL  3 mL Nebulization Q6H PRN Narda Bonds, MD       midazolam (VERSED) injection 2 mg  2 mg Intravenous Q4H PRN Lorin Glass, MD   2 mg at 03/27/23 2130   Oral care mouth rinse  15 mL Mouth Rinse Q2H Lorin Glass, MD   15 mL at 03/27/23 8657   Oral care mouth rinse  15 mL Mouth Rinse PRN Lorin Glass, MD       piperacillin-tazobactam (ZOSYN) IVPB 2.25 g  2.25 g Intravenous Q6H Doristine Counter, [place]   Stopped at 03/27/23 0243   polyethylene glycol (MIRALAX / GLYCOLAX) packet 17 g  17 g Oral Daily PRN Synetta Fail, MD       polyethylene glycol (MIRALAX / GLYCOLAX) packet 17 g  17 g Per Tube Daily Lorin Glass, MD   17 g at 03/26/23 0853   potassium chloride (KLOR-CON) packet 20 mEq  20 mEq Per Tube Once Rocky Morel, DO       potassium chloride 10 mEq in 50 mL *CENTRAL LINE* IVPB  10 mEq Intravenous Q1 Hr x 2 Migdalia Dk, MD 50 mL/hr at 03/27/23 0738 10 mEq at 03/27/23 0738   sodium chloride flush (NS) 0.9 % injection 3 mL  3 mL Intravenous Q12H Synetta Fail, MD   3 mL at 03/26/23 2206   zolpidem (AMBIEN) tablet 5 mg  5 mg Oral QHS PRN Hillary Bow, DO   5 mg at 03/24/23 0211     Review of Systems: 10 systems reviewed and negative except per interval history/subjective  Physical Exam: Vitals:   03/27/23 0704 03/27/23 0726  BP:    Pulse:    Resp:  (!) 26  Temp: (!) 97.3 F (36.3 C)   SpO2:     No intake/output data recorded.  Intake/Output Summary (Last 24 hours) at 03/27/2023 8469 Last data filed at 03/27/2023 6295 Gross per 24 hour  Intake 1861.15 ml  Output 2790 ml  Net -928.85 ml   General appearance: Intubated on ventilator Head: Normocephalic, without obvious abnormality, atraumatic Eyes: Negative Neck: no carotid bruit, no JVD, supple, symmetrical, trachea midline, symmetric, no tenderness/mass/nodules Resp: rales bilaterally to anterior exam and mechanical BS  Cardio: Regular rate and regular rhythm GI: Soft, non-tender; bowel sounds normal; no masses,  no organomegaly Extremities: No LE edema Pulses: 2+ and symmetric PT and DP Skin: Skin color, turgor normal. Dry LE. No purpura, petehiae, rashes or ulcers noted.  Test Results I personally reviewed new and old clinical labs and radiology tests Lab Results  Component Value Date   NA 143 03/27/2023   K 3.2 (L) 03/27/2023   CL 109 03/27/2023   CO2 18 (L) 03/27/2023   BUN 138 (H) 03/27/2023   CREATININE 5.06 (H) 03/27/2023   CALCIUM 7.7 (L) 03/27/2023   ALBUMIN 2.4 (L) 03/22/2023   PHOS 4.9 (H) 03/27/2023    CBC Recent Labs  Lab 03/22/23 1124 03/22/23 1156 03/25/23 0437 03/26/23 0335 03/27/23 0433  WBC 24.5*   < > 19.8*  19.2* 38.3* 32.6*  NEUTROABS 22.7*  --  18.5*  --   --   HGB 10.9*   < > 8.6*  8.7* 9.4* 8.6*  HCT 33.7*   < > 27.1*  27.0* 27.9* 26.4*  MCV 67.5*   < > 67.9*  67.5* 65.8* 66.5*  PLT 187   < > PLATELET CLUMPS NOTED ON SMEAR, UNABLE TO ESTIMATE  252 309 269   < > = values in this interval not displayed.

## 2023-03-28 ENCOUNTER — Inpatient Hospital Stay (HOSPITAL_COMMUNITY): Payer: Self-pay

## 2023-03-28 LAB — POCT I-STAT 7, (LYTES, BLD GAS, ICA,H+H)
Acid-base deficit: 9 mmol/L — ABNORMAL HIGH (ref 0.0–2.0)
Bicarbonate: 15 mmol/L — ABNORMAL LOW (ref 20.0–28.0)
Calcium, Ion: 1.06 mmol/L — ABNORMAL LOW (ref 1.15–1.40)
HCT: 29 % — ABNORMAL LOW (ref 39.0–52.0)
Hemoglobin: 9.9 g/dL — ABNORMAL LOW (ref 13.0–17.0)
O2 Saturation: 88 %
Potassium: 3.4 mmol/L — ABNORMAL LOW (ref 3.5–5.1)
Sodium: 137 mmol/L (ref 135–145)
TCO2: 16 mmol/L — ABNORMAL LOW (ref 22–32)
pCO2 arterial: 24.8 mm[Hg] — ABNORMAL LOW (ref 32–48)
pH, Arterial: 7.392 (ref 7.35–7.45)
pO2, Arterial: 54 mm[Hg] — ABNORMAL LOW (ref 83–108)

## 2023-03-28 LAB — CBC
HCT: 28 % — ABNORMAL LOW (ref 39.0–52.0)
Hemoglobin: 9.2 g/dL — ABNORMAL LOW (ref 13.0–17.0)
MCH: 21.7 pg — ABNORMAL LOW (ref 26.0–34.0)
MCHC: 32.9 g/dL (ref 30.0–36.0)
MCV: 66.2 fL — ABNORMAL LOW (ref 80.0–100.0)
Platelets: 347 10*3/uL (ref 150–400)
RBC: 4.23 MIL/uL (ref 4.22–5.81)
RDW: 16.7 % — ABNORMAL HIGH (ref 11.5–15.5)
WBC: 64.1 10*3/uL (ref 4.0–10.5)
nRBC: 1.3 % — ABNORMAL HIGH (ref 0.0–0.2)

## 2023-03-28 LAB — RENAL FUNCTION PANEL
Albumin: 1.7 g/dL — ABNORMAL LOW (ref 3.5–5.0)
Albumin: 1.8 g/dL — ABNORMAL LOW (ref 3.5–5.0)
Anion gap: 15 (ref 5–15)
Anion gap: 14 (ref 5–15)
BUN: 130 mg/dL — ABNORMAL HIGH (ref 6–20)
BUN: 131 mg/dL — ABNORMAL HIGH (ref 6–20)
CO2: 16 mmol/L — ABNORMAL LOW (ref 22–32)
CO2: 17 mmol/L — ABNORMAL LOW (ref 22–32)
Calcium: 7.4 mg/dL — ABNORMAL LOW (ref 8.9–10.3)
Calcium: 7.7 mg/dL — ABNORMAL LOW (ref 8.9–10.3)
Chloride: 105 mmol/L (ref 98–111)
Chloride: 103 mmol/L (ref 98–111)
Creatinine, Ser: 4.51 mg/dL — ABNORMAL HIGH (ref 0.61–1.24)
Creatinine, Ser: 4.52 mg/dL — ABNORMAL HIGH (ref 0.61–1.24)
GFR, Estimated: 15 mL/min — ABNORMAL LOW (ref >60)
GFR, Estimated: 15 mL/min — ABNORMAL LOW (ref >60)
Glucose, Bld: 247 mg/dL — ABNORMAL HIGH (ref 70–99)
Glucose, Bld: 146 mg/dL — ABNORMAL HIGH (ref 70–99)
Phosphorus: 4.5 mg/dL (ref 2.5–4.6)
Phosphorus: 3.1 mg/dL (ref 2.5–4.6)
Potassium: 3.3 mmol/L — ABNORMAL LOW (ref 3.5–5.1)
Potassium: 3.5 mmol/L (ref 3.5–5.1)
Sodium: 136 mmol/L (ref 135–145)
Sodium: 134 mmol/L — ABNORMAL LOW (ref 135–145)

## 2023-03-28 LAB — CBC WITH DIFFERENTIAL/PLATELET
Abs Immature Granulocytes: 0 10*3/uL (ref 0.00–0.07)
Basophils Absolute: 0 10*3/uL (ref 0.0–0.1)
Basophils Relative: 0 %
Eosinophils Absolute: 0 10*3/uL (ref 0.0–0.5)
Eosinophils Relative: 0 %
HCT: 26.4 % — ABNORMAL LOW (ref 39.0–52.0)
Hemoglobin: 8.8 g/dL — ABNORMAL LOW (ref 13.0–17.0)
Lymphocytes Relative: 7 %
Lymphs Abs: 4.1 10*3/uL — ABNORMAL HIGH (ref 0.7–4.0)
MCH: 22 pg — ABNORMAL LOW (ref 26.0–34.0)
MCHC: 33.3 g/dL (ref 30.0–36.0)
MCV: 66 fL — ABNORMAL LOW (ref 80.0–100.0)
Monocytes Absolute: 2.3 10*3/uL — ABNORMAL HIGH (ref 0.1–1.0)
Monocytes Relative: 4 %
Neutro Abs: 51.6 10*3/uL — ABNORMAL HIGH (ref 1.7–7.7)
Neutrophils Relative %: 89 %
Platelets: 310 10*3/uL (ref 150–400)
RBC: 4 MIL/uL — ABNORMAL LOW (ref 4.22–5.81)
RDW: 16.3 % — ABNORMAL HIGH (ref 11.5–15.5)
WBC: 58 10*3/uL (ref 4.0–10.5)
nRBC: 0 /100{WBCs}
nRBC: 1.1 % — ABNORMAL HIGH (ref 0.0–0.2)

## 2023-03-28 LAB — BASIC METABOLIC PANEL
Anion gap: 19 — ABNORMAL HIGH (ref 5–15)
BUN: 141 mg/dL — ABNORMAL HIGH (ref 6–20)
CO2: 15 mmol/L — ABNORMAL LOW (ref 22–32)
Calcium: 7.3 mg/dL — ABNORMAL LOW (ref 8.9–10.3)
Chloride: 101 mmol/L (ref 98–111)
Creatinine, Ser: 5.04 mg/dL — ABNORMAL HIGH (ref 0.61–1.24)
GFR, Estimated: 13 mL/min — ABNORMAL LOW (ref >60)
Glucose, Bld: 242 mg/dL — ABNORMAL HIGH (ref 70–99)
Potassium: 2.7 mmol/L — CL (ref 3.5–5.1)
Sodium: 135 mmol/L (ref 135–145)

## 2023-03-28 LAB — GLUCOSE, CAPILLARY
Glucose-Capillary: 132 mg/dL — ABNORMAL HIGH (ref 70–99)
Glucose-Capillary: 142 mg/dL — ABNORMAL HIGH (ref 70–99)
Glucose-Capillary: 153 mg/dL — ABNORMAL HIGH (ref 70–99)
Glucose-Capillary: 154 mg/dL — ABNORMAL HIGH (ref 70–99)
Glucose-Capillary: 207 mg/dL — ABNORMAL HIGH (ref 70–99)
Glucose-Capillary: 236 mg/dL — ABNORMAL HIGH (ref 70–99)
Glucose-Capillary: 265 mg/dL — ABNORMAL HIGH (ref 70–99)
Glucose-Capillary: 377 mg/dL — ABNORMAL HIGH (ref 70–99)
Glucose-Capillary: 424 mg/dL — ABNORMAL HIGH (ref 70–99)

## 2023-03-28 LAB — MAGNESIUM: Magnesium: 2.4 mg/dL (ref 1.7–2.4)

## 2023-03-28 LAB — BETA-HYDROXYBUTYRIC ACID: Beta-Hydroxybutyric Acid: 0.08 mmol/L (ref 0.05–0.27)

## 2023-03-28 MED ORDER — INSULIN ASPART 100 UNIT/ML IJ SOLN
0.0000 [IU] | Freq: Every day | INTRAMUSCULAR | Status: DC
  Administered 2023-04-01 – 2023-04-03 (×3): 5 [IU] via SUBCUTANEOUS
  Administered 2023-04-04: 3 [IU] via SUBCUTANEOUS
  Administered 2023-04-05: 4 [IU] via SUBCUTANEOUS
  Administered 2023-04-06 – 2023-04-07 (×2): 2 [IU] via SUBCUTANEOUS

## 2023-03-28 MED ORDER — POTASSIUM CHLORIDE CRYS ER 20 MEQ PO TBCR
40.0000 meq | EXTENDED_RELEASE_TABLET | Freq: Once | ORAL | Status: AC
  Administered 2023-03-28: 40 meq via ORAL
  Filled 2023-03-28: qty 2

## 2023-03-28 MED ORDER — DEXTROSE 50 % IV SOLN: 0.0000 mL | INTRAVENOUS | Status: DC | PRN

## 2023-03-28 MED ORDER — FAMOTIDINE 20 MG PO TABS
10.0000 mg | ORAL_TABLET | Freq: Every day | ORAL | Status: DC
  Administered 2023-03-28 – 2023-04-08 (×12): 10 mg via ORAL
  Filled 2023-03-28 (×12): qty 1

## 2023-03-28 MED ORDER — POTASSIUM CHLORIDE 10 MEQ/50ML IV SOLN
10.0000 meq | INTRAVENOUS | Status: AC
  Administered 2023-03-28 (×2): 10 meq via INTRAVENOUS
  Filled 2023-03-28 (×2): qty 50

## 2023-03-28 MED ORDER — INSULIN GLARGINE-YFGN 100 UNIT/ML ~~LOC~~ SOLN
20.0000 [IU] | Freq: Two times a day (BID) | SUBCUTANEOUS | Status: DC
  Administered 2023-03-28 – 2023-03-31 (×8): 20 [IU] via SUBCUTANEOUS
  Filled 2023-03-28 (×11): qty 0.2

## 2023-03-28 MED ORDER — INSULIN ASPART 100 UNIT/ML IJ SOLN
2.0000 [IU] | INTRAMUSCULAR | Status: DC
  Administered 2023-03-28: 4 [IU] via SUBCUTANEOUS

## 2023-03-28 MED ORDER — POLYETHYLENE GLYCOL 3350 17 G PO PACK: 17.0000 g | PACK | Freq: Every day | ORAL | Status: DC

## 2023-03-28 MED ORDER — POTASSIUM CHLORIDE 10 MEQ/50ML IV SOLN: 10.0000 meq | INTRAVENOUS | Status: DC

## 2023-03-28 MED ORDER — CARVEDILOL 6.25 MG PO TABS
6.2500 mg | ORAL_TABLET | Freq: Two times a day (BID) | ORAL | Status: DC
  Administered 2023-03-28 – 2023-03-31 (×7): 6.25 mg via ORAL
  Filled 2023-03-28 (×2): qty 2
  Filled 2023-03-28 (×2): qty 1
  Filled 2023-03-28: qty 2
  Filled 2023-03-28 (×2): qty 1

## 2023-03-28 MED ORDER — INSULIN ASPART 100 UNIT/ML IJ SOLN
0.0000 [IU] | Freq: Three times a day (TID) | INTRAMUSCULAR | Status: DC
  Administered 2023-03-28: 7 [IU] via SUBCUTANEOUS
  Administered 2023-03-28: 3 [IU] via SUBCUTANEOUS
  Administered 2023-03-29: 4 [IU] via SUBCUTANEOUS
  Administered 2023-03-29: 3 [IU] via SUBCUTANEOUS
  Administered 2023-03-30: 7 [IU] via SUBCUTANEOUS
  Administered 2023-03-30 (×2): 4 [IU] via SUBCUTANEOUS
  Administered 2023-03-31: 15 [IU] via SUBCUTANEOUS
  Administered 2023-03-31: 4 [IU] via SUBCUTANEOUS
  Administered 2023-03-31: 20 [IU] via SUBCUTANEOUS
  Administered 2023-04-01: 7 [IU] via SUBCUTANEOUS
  Administered 2023-04-02: 4 [IU] via SUBCUTANEOUS
  Administered 2023-04-02: 20 [IU] via SUBCUTANEOUS
  Administered 2023-04-03 (×2): 7 [IU] via SUBCUTANEOUS
  Administered 2023-04-03 – 2023-04-04 (×2): 4 [IU] via SUBCUTANEOUS
  Administered 2023-04-04: 7 [IU] via SUBCUTANEOUS
  Administered 2023-04-04: 20 [IU] via SUBCUTANEOUS
  Administered 2023-04-05 (×2): 7 [IU] via SUBCUTANEOUS
  Administered 2023-04-05: 4 [IU] via SUBCUTANEOUS
  Administered 2023-04-06: 7 [IU] via SUBCUTANEOUS
  Administered 2023-04-06: 3 [IU] via SUBCUTANEOUS
  Administered 2023-04-06: 11 [IU] via SUBCUTANEOUS
  Administered 2023-04-07: 2 [IU] via SUBCUTANEOUS
  Administered 2023-04-07 (×2): 11 [IU] via SUBCUTANEOUS
  Administered 2023-04-08: 3 [IU] via SUBCUTANEOUS

## 2023-03-28 MED ORDER — SODIUM BICARBONATE 650 MG PO TABS
650.0000 mg | ORAL_TABLET | Freq: Two times a day (BID) | ORAL | Status: DC
  Administered 2023-03-28: 650 mg via ORAL
  Filled 2023-03-28: qty 1

## 2023-03-28 MED ORDER — FUROSEMIDE 10 MG/ML IJ SOLN
40.0000 mg | Freq: Once | INTRAMUSCULAR | Status: AC
  Administered 2023-03-28: 40 mg via INTRAVENOUS
  Filled 2023-03-28: qty 4

## 2023-03-28 MED ORDER — POTASSIUM CHLORIDE CRYS ER 20 MEQ PO TBCR
60.0000 meq | EXTENDED_RELEASE_TABLET | Freq: Once | ORAL | Status: AC
  Administered 2023-03-28: 60 meq via ORAL
  Filled 2023-03-28: qty 3

## 2023-03-28 MED ORDER — INSULIN ASPART 100 UNIT/ML IJ SOLN
10.0000 [IU] | Freq: Three times a day (TID) | INTRAMUSCULAR | Status: DC
  Administered 2023-03-28 – 2023-03-31 (×10): 10 [IU] via SUBCUTANEOUS

## 2023-03-28 MED ORDER — FUROSEMIDE 10 MG/ML IJ SOLN
120.0000 mg | Freq: Once | INTRAVENOUS | Status: AC
  Administered 2023-03-28: 120 mg via INTRAVENOUS
  Filled 2023-03-28: qty 2

## 2023-03-28 MED ORDER — DOCUSATE SODIUM 100 MG PO CAPS: 100.0000 mg | ORAL_CAPSULE | Freq: Two times a day (BID) | ORAL | Status: DC

## 2023-03-28 MED ORDER — INSULIN REGULAR(HUMAN) IN NACL 100-0.9 UT/100ML-% IV SOLN: INTRAVENOUS | Status: DC

## 2023-03-28 NOTE — Progress Notes (Signed)
Physical Therapy Treatment Patient Details Name: Gary Conway MRN: [id number] DOB: May 04, 1976 Today's Date: 03/28/2023   History of Present Illness Pt is a 47 y/o male presenting to the ED on 10 25 with 1 week of fevers and malaise and was admitted for DKA and severe AKI.  Imaging found bil opacities and a small right pleual effusion, so treated for bil PNA  Intubated 10/27,  extubated 10/30.  PMHx:  DMII, hemangioma of spine resected with laminectomy T2-4.    PT Comments  Pt received in recliner and very lethargic today, needed frequent stimulation to mobilize. Pt stood several times with mod A to RW, posterior bias initially but improved with practice. Pt able to tolerate pregait activities for 30 secs at a time but unable to progress ambulation due to fatigue and SPO2 dropping to 83% with pt on 15 L HFNC. Returned to 90's with seated rest breaks of 3-4 minutes. Pt needs cues for pursed lip breathing and use of IS. PT will continue to follow.     If plan is discharge home, recommend the following: A little help with walking and/or transfers;A little help with bathing/dressing/bathroom;Assistance with cooking/housework;Assist for transportation;Help with stairs or ramp for entrance   Can travel by private vehicle        Equipment Recommendations  Other (comment) (TBD)    Recommendations for Other Services       Precautions / Restrictions Precautions Precautions: None Restrictions Weight Bearing Restrictions: No     Mobility  Bed Mobility               General bed mobility comments: received in recliner    Transfers Overall transfer level: Needs assistance Equipment used: Rolling walker (2 wheels) Transfers: Sit to/from Stand Sit to Stand: Mod assist           General transfer comment: pt needed mod A and vc's to stand. Posterior bias with first stand resulting in quick return to sitting. vc's for pushing through RW with subsequent stands and pt able to  maintain standing up to 1 min before needing to sit    Ambulation/Gait             Pre-gait activities: wt shifting, stepping feet fwd and bkwd with RW and min A General Gait Details: unable due to lethargy   Stairs             Wheelchair Mobility     Tilt Bed    Modified Rankin (Stroke Patients Only)       Balance Overall balance assessment: Needs assistance Sitting-balance support: No upper extremity supported, Single extremity supported, Feet supported Sitting balance-Leahy Scale: Fair     Standing balance support: Single extremity supported, No upper extremity supported, During functional activity Standing balance-Leahy Scale: Poor Standing balance comment: reliant on UE and external support to maintain balance                            Cognition Arousal: Lethargic Behavior During Therapy: Flat affect Overall Cognitive Status: No family/caregiver present to determine baseline cognitive functioning                                 General Comments: pt lethargic and slow but appropriate with responses. Gown noted to be soaked with urine from insufficient use of urinal. Gown and blanket changed        Exercises General  Exercises - Lower Extremity Long Arc Quad: AROM, Both, 10 reps, Seated Hip Flexion/Marching: AROM, Both, 10 reps, Seated    General Comments General comments (skin integrity, edema, etc.): SPO2 dropped to 84% on 15 L O2 via HFNC, returned to 92% with seated rest and cues for pursed lip breathing. Each time took 3-4 mins to return to 90's. HR 108 bpm. BP 147/101 after session      Pertinent Vitals/Pain Pain Assessment Pain Assessment: No/denies pain    Home Living                          Prior Function            PT Goals (current goals can now be found in the care plan section) Acute Rehab PT Goals Patient Stated Goal: feel better and get out of here independent PT Goal Formulation: With  patient Time For Goal Achievement: 04/10/23 Potential to Achieve Goals: Good Progress towards PT goals: Progressing toward goals    Frequency    Min 1X/week      PT Plan      Co-evaluation              AM-PAC PT "6 Clicks" Mobility   Outcome Measure  Help needed turning from your back to your side while in a flat bed without using bedrails?: A Little Help needed moving from lying on your back to sitting on the side of a flat bed without using bedrails?: A Little Help needed moving to and from a bed to a chair (including a wheelchair)?: A Little Help needed standing up from a chair using your arms (e.g., wheelchair or bedside chair)?: A Lot Help needed to walk in hospital room?: Total Help needed climbing 3-5 steps with a railing? : Total 6 Click Score: 13    End of Session Equipment Utilized During Treatment: Oxygen Activity Tolerance: Patient tolerated treatment well;Patient limited by fatigue Patient left: with chair alarm set;in chair;with call bell/phone within reach Nurse Communication: Mobility status PT Visit Diagnosis: Other abnormalities of gait and mobility (R26.89)     Time: 1610-9604 PT Time Calculation (min) (ACUTE ONLY): 27 min  Charges:    $Therapeutic Activity: 23-37 mins PT General Charges $$ ACUTE PT VISIT: 1 Visit                     Lyanne Co, PT  Acute Rehab Services Secure chat preferred Office [phone]    Lawana Chambers Kristofer Schaffert 03/28/2023, 2:44 PM

## 2023-03-28 NOTE — Progress Notes (Signed)
NAME:  Gary Conway, MRN:  [id number], DOB:  [birth year], LOS: 6 ADMISSION DATE:  03/22/2023, CONSULTATION DATE:  03/24/2023 REFERRING MD:  Caleb Popp, CHIEF COMPLAINT:  Acute hypoxemic respiratory failure   History of Present Illness:  47 year old male with PMH of T2DM, spinal cord angioma s/p resection 2015, and obesity who presented to the ED on 10/25 with 1 week of fevers and malaise and was admitted for DKA and severe AKI.  Presented febrile, tachycardic, hypertensive, tachypneic, and hypoxic responsive to 5 L nasal cannula with labs showing hyponatremia, anion gap metabolic acidosis with a gap of 24, creatinine 5.65 from a baseline of 1.1 (2021), leukocytosis of 24, hemoglobin 10.9, lactic acid 4.4, Pro-Cal 141.  He was found to have bilateral opacities chest x-ray and small right pleural effusion.  Treated for DKA and community-acquired pneumonia.  On 10/27 respiratory status worsened and patient was intubated and transferred to the ICU.  Pertinent  Medical History  T2DM Spinal Hemangioma s/p excision 2015  Significant Hospital Events: Including procedures, antibiotic start and stop dates in addition to other pertinent events   10/25: Admitted with DKA, AKI, bilateral pneumonia w/ possible developing ARDS 10/27: Intubated for worsening respiratory status, bronchoscopy w/ alveolitis, POCUS TTE w/ normal RV function, renal ultrasound WNL.  Antibiotics broadened and pulsed dose steroids added 10/28: passing SBTs 10/30: extubated  Interim History / Subjective:  Doing better this morning after extubation yesterday. He is eating well without issue and having regular soft/liquid bowel movements. His only concern is his breathing but he is reminded that his lungs are still recovering and will take time to get better. No worsening in breathing overnight.  Objective   Blood pressure (!) 141/94, pulse 99, temperature 97.7 F (36.5 C), temperature source Oral, resp. rate (!) 21, height 5\' 8"   (1.727 m), weight 89 kg, SpO2 94%. CVP:  [13 mmHg] 13 mmHg  Vent Mode: PSV;CPAP FiO2 (%):  [40 %] 40 % Set Rate:  [22 bmp] 22 bmp Vt Set:  [550 mL] 550 mL PEEP:  [5 cmH20] 5 cmH20 Pressure Support:  [10 cmH20] 10 cmH20 Plateau Pressure:  [21 cmH20] 21 cmH20   Intake/Output Summary (Last 24 hours) at 03/28/2023 0601 Last data filed at 03/28/2023 0441 Gross per 24 hour  Intake 1268.92 ml  Output 1950 ml  Net -681.08 ml   Filed Weights   03/22/23 1122 03/22/23 2056 03/26/23 0701  Weight: 86.2 kg 86.3 kg 89 kg    Examination: Constitutional: intubated middle-age male. HENT: Normocephalic, atraumatic,  Eyes: Sclera non-icteric, PERRL, EOM intact Cardio: regular rate and rhythm. 2+ bilateral radial and dorsalis pedis  pulses. Pulm: Decreased breath sounds on the right compared to the left.  Intubated and ventilated Abdomen: Soft, non-tender, mildly distended, positive bowel sounds. QMV:HQIONGEX for extremity edema. Skin:Warm and dry. Neuro:Alert and follows commands. No focal deficit noted.  Resolved Hospital Problem list   DKA Severe sepsis  Assessment & Plan:  Acute hypoxemic respiratory failure with ARDS and DAH-likely 2/2 pneumonia vs pulmonary renal syndrome (negative workup so far).  Intubated 10/27, bronchoscopy with BAL consistent with DAH, BAL analysis with neutrophil predominance, CXR with bilateral infiltrates, PaO2/FiO2 initially 180, Pro-Cal 141-60, BNP 450, TTE without significant abnormality. Extubated 10/30 to nasal cannula. Severe AKI vs CKD Stage IV/V, nonoliguric-creatinine 5.7 on admission down to 4.9-5 and stable over 3 days, BUN up to 138.  Baseline creatinine unknown and last values from 2021, 1.0. Renal ultrasound without significant abnormality. 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   Workup positive for elevated ESR and CRP as well as RF and aldolase but otherwise negative including ANCA and GBM.  Empirically started pulsed dose steroids with methylprednisolone 1 g daily for 3 days on 10/27 completed 10/29.  Now on 60 mg IV methylprednisolone daily with plans to taper but will wait for renal biopsy results to decide on length.  Severe sepsis, resolved-likely 2/2 bilateral pneumonia.  Given 3 days of azithromycin and ceftriaxone prior to transfer to ICU.  Improved on vancomycin and Zosyn started 10/27.  Vancomycin removed and continued on Zosyn and azithromycin (completed 5 days on 10/30).  BAL and blood cultures negative to date.  Pro-Cal trending down without recurrence of fevers. Plan for 7 days of zosyn. T2DM- A1c 8.1 on admission, treated for DKA on admission, difficult to control sugar on high dose steroids with plans for long term steroid taper. On and off insulin drip for sugar control but also has an AGMA with possible NAGMA.  Microcytic anemia-admitted with hemoglobin of 10.5, down to 8.7, and stable at 8.6 today. LDH elevated at 240 but blood smear normal, reticulocyte index low, and iron labs consistent with inflammatory anemia (could be IDA with ferritin elevated as acute phase reactant) Hypertension- pt self tapered off amlodipine at home, BP elevated after extubation and amlodipine 5 mg added 10/30  Plan Continue to wean O2 as tolerated Methylprednisolone 60 mg IV daily Renal biopsy Continue strict ins and outs Continue Zosyn, day 5/7 (complete 11/2) Repeat BMP/BHB at 10 am Basal bolus insulin, 20 units semglee bid, 10 units meal coverage, and resistant SSI Start carvedilol 6.25 mg BID and continue amlodipine 5 mg daily PT eval  stable for transfer to progressive  Best Practice (right click and "Reselect all SmartList Selections" daily)   Diet/type: tubefeeds DVT prophylaxis: prophylactic heparin  GI prophylaxis: H2B Lines: Central line 10/27 Foley:  Yes,  and it is still needed Code Status:  full code Last date of multidisciplinary goals of care discussion [03/27/2023]  Labs   CBC: Recent Labs  Lab 03/22/23 1124 03/22/23 1156 03/25/23 0437 03/26/23 0335 03/27/23 0433 03/28/23 0233 03/28/23 0404  WBC 24.5*   < > 19.8*  19.2* 38.3* 32.6* 64.1* 58.0*  NEUTROABS 22.7*  --  18.5*  --   --   --  51.6*  HGB 10.9*   < > 8.6*  8.7* 9.4* 8.6* 9.2* 8.8*  HCT 33.7*   < > 27.1*  27.0* 27.9* 26.4* 28.0* 26.4*  MCV 67.5*   < > 67.9*  67.5* 65.8* 66.5* 66.2* 66.0*  PLT 187   < > PLATELET CLUMPS NOTED ON SMEAR, UNABLE TO ESTIMATE  252 309 269 347 310   < > = values in this interval not displayed.    Basic Metabolic Panel: Recent Labs  Lab 03/22/23 1806 03/22/23 2122 03/25/23 0144 03/25/23 0437 03/25/23 1212 03/25/23 1738 03/26/23 0335 03/26/23 1702 03/27/23 0433 03/27/23 1735 03/28/23 0233  NA 127*   < > 130*  --   --   --  134*  --  143 143 135  K 4.3   < > 4.2  --   --   --  3.4*  --  3.2* 3.3* 2.7*  CL 91*   < > 95*  --   --   --  100  --  109 110  101  CO2 18*   < > 18*  --   --   --  16*  --  18* 17* 15*  GLUCOSE 441*   < > 332*  --   --   --  319*  --  160* 209* 242*  BUN 72*   < > 91*  --   --   --  110*  --  138* 140* 141*  CREATININE 5.69*   < > 4.72* 4.91*  --   --  4.86*  --  5.06* 4.72* 5.04*  CALCIUM 8.0*   < > 7.3*  --   --   --  7.8*  --  7.7* 7.6* 7.3*  MG 2.4  --   --   --   --  2.8* 3.1* 3.0* 2.9*  --   --   PHOS  --   --   --   --    < > 5.1* 5.3* 4.3 4.9* 4.2  --    < > = values in this interval not displayed.   GFR: Estimated Creatinine Clearance: 19.2 mL/min (A) (by C-G formula based on SCr of 5.04 mg/dL (H)). Recent Labs  Lab 03/22/23 1149 03/23/23 0656 03/23/23 1126 03/24/23 0352 03/25/23 0144 03/25/23 0437 03/26/23 0335 03/27/23 0433 03/28/23 0233 03/28/23 0404  PROCALCITON  --  141.68  --  82.45 58.82  --   --   --   --   --   WBC  --  20.3*  --  28.7* 16.8*   < > 38.3* 32.6* 64.1* 58.0*   LATICACIDVEN 4.4* 1.7 1.7  --   --   --   --   --   --   --    < > = values in this interval not displayed.    Liver Function Tests: Recent Labs  Lab 03/22/23 1124 03/27/23 1735  AST 62*  --   ALT 33  --   ALKPHOS 85  --   BILITOT 1.8*  --   PROT 7.7  --   ALBUMIN 2.4* 1.6*   No results for input(s): "LIPASE", "AMYLASE" in the last 168 hours. No results for input(s): "AMMONIA" in the last 168 hours.  ABG    Component Value Date/Time   PHART 7.290 (L) 03/24/2023 1719   PCO2ART 37.0 03/24/2023 1719   PO2ART 108 03/24/2023 1719   HCO3 18.0 (L) 03/24/2023 1719   TCO2 19 (L) 03/24/2023 1719   ACIDBASEDEF 8.0 (H) 03/24/2023 1719   O2SAT 98 03/24/2023 1719     Coagulation Profile: Recent Labs  Lab 03/23/23 0656  INR 1.3*    Cardiac Enzymes: Recent Labs  Lab 03/24/23 1830  CKTOTAL 377    HbA1C: Hgb A1c MFr Bld  Date/Time Value Ref Range Status  03/22/2023 06:06 PM 8.1 (H) 4.8 - 5.6 % Final    Comment:    (NOTE) Pre diabetes:          5.7%-6.4%  Diabetes:              >6.4%  Glycemic control for   <7.0% adults with diabetes   09/13/2019 02:26 AM 12.9 (H) 4.8 - 5.6 % Final    Comment:    (NOTE) Pre diabetes:          5.7%-6.4% Diabetes:              >6.4% Glycemic control for   <7.0% adults with diabetes     CBG: Recent Labs  Lab 03/28/23 0004 03/28/23  0041 03/28/23 0137 03/28/23 0231 03/28/23 0328  GLUCAP 424* 377* 265* 207* 154*    Review of Systems:   Dyspnea No nausea, vomiting, abdominal pain.  Past Medical History:  He,  has a past medical history of Diabetes mellitus, type II (HCC) (2015) and Hemangioma of spine (2015).   Surgical History:   Past Surgical History:  Procedure Laterality Date   LAMINECTOMY N/A 03/22/2014   Procedure: THORACIC two - four  LAMINECTOMY FOR TUMOR;  Surgeon: Temple Pacini, MD;  Location: [facility];  Service: Neurosurgery;  Laterality: N/A;  Throacic two  - four laminectomy for tumor   LEG SURGERY        Social History:   reports that he has never smoked. He has never used smokeless tobacco. He reports current alcohol use. He reports that he does not use drugs.   Family History:  His family history includes Breast cancer in his mother.   Allergies Allergies  Allergen Reactions   Other Hives and Itching    Ketchup & Mustard     Home Medications  Prior to Admission medications   Medication Sig Start Date End Date Taking? Authorizing Provider  amLODipine (NORVASC) 5 MG tablet Take 5 mg by mouth daily.   Yes [provider]  metFORMIN (GLUCOPHAGE) 500 MG tablet Take 1 tablet (500 mg total) by mouth 2 (two) times daily with a meal. 09/14/19  Yes Leroy Sea, MD  blood glucose meter kit and supplies KIT Dispense based on patient and insurance preference. Use up to four times daily as directed. (FOR ICD-9 250.00, 250.01). For QAC - HS accuchecks. 09/14/19   Leroy Sea, MD     Critical care time: 45    Rocky Morel, DO Internal Medicine Resident, PGY-2 Pager# [phone]  Pulmonary Critical Care 03/28/2023 6:01 AM  For contact information, see Amion. If no response to pager, please call PCCM consult pager. After hours, 7PM- 7AM, please call Elink.

## 2023-03-28 NOTE — Progress Notes (Addendum)
Nephrology Follow-Up Consult note   Assessment/Recommendations: Gary Conway is a/an 47 y.o. male with a past medical history significant for renal failure of unclear chronicity, AHRF with concern for DAH based on bronchoscopy findings 10/27 and likely PNA, and he was admitted for DKA.    Renal failure with unclear time course; Nonoliguric.  Normal creatinine 2021 and then presented with creatinine of 5.7, 4.86, 5.06, and now 5.04 this morning.  Presentation, symptoms and bronchoscopy results are concerning for [facility]. Serologies ordered for suspicion of small vessel vasculitis. Labs resulted including negative ANA, ds-DNA, C3 and C4, ANCA panel, and GBM all negative.  Not overtly nephritic presentation, but also not completely incompatible with it. I would expect more proteinuria and hematuria. Started 3-day 1000 mg Solu-Medrol pulse 10/27. With most serologies resulted as negative, no recommendations for continued steroids from a renal perspective. Critical care continuing steroids as of now, methylprednisolone 60 mg IV daily with plans to taper, waiting renal biopsy. Renal US shows no hydronephrosis, and likely foley balloon with decompressed bladder.  Renal biopsy ordered to be completed by IR when patient is stable. The renal biopsy may not provide clear etiology, but will give insight into patient's overall renal function, scarring/fibrosis, etc.  Patient likely has some degree of CKD with unknown chronicity/baseline. No indication for dialysis at this time. Patient continues with urine output -709 mL so far today and -2.4L previous 24 hours. Foley catheter has been removed. BUN likely elevated due to recent pulse steroids.  Okay with re-diuresis today 120 mg IV lasix since he had good UOP in days prior with this dose.    Hypokalemia:   -K of 2.7 today and potassium chloride provided by critical care team. Critical care team to manage. Recommend stopping sodium bicarb which is being provided  for acidosis, as this will drop K even lower.    Anemia, inflammatory: Transfuse for Hgb<7 g/dL Iron and TIBC ordered by primary team with iron: 6, TIBC 155 and saturation ratio of 4. UIBC 149.   Uncontrolled Diabetes Mellitus Type 2 with Hyperglycemia; DKA at presentation -Managed by primary team   Sepsis from multilobar pneumonia on antibiotics and previously intubated for respiratory distress with bloody BAL return with bronchoscope (serial lavage of LLL without clearning).  -Managed with abx by primary team. -Blood cultures no growth after 5 days. -Patient extubated 10/30 after weaning off fentanyl, managed per critical care team. Initially on 9L [place] post-extubation, then increased to 12L, now up to 15. Patient with tachypnea and rhonchi BL lungs on  set to 15 this morning.   Right side pleural effusion  Leukocytopenia -CXR post-extubation on 10/31 showing large R sided pleural effusion. Given increased need for supplemental O2, increased WOB, newly increased WBC count of 64 and effusion, would recommend thoracentesis. Critical care with ultimate decision on this.  -Per critical care team, patient is being transitioned to the floor and no thoracentesis since pleural effusion is similar compared to prior CXR.     Recommendations conveyed to primary service.    Theador Hawthorne Madaline Lefeber PA student  [facility] 03/28/2023 8:00 AM  ___________________________________________________________  CC: Fevers  Interval History/Subjective:   Patient was extubated yesterday which went well. Patient resting in bed with increased work of breathing. He opens eyes to   Medications:  Current Facility-Administered Medications  Medication Dose Route Frequency Provider Last Rate Last Admin   acetaminophen (TYLENOL) tablet 650 mg  650 mg Oral Q6H PRN Synetta Fail, MD   650 mg  at 03/23/23 0424   Or   acetaminophen (TYLENOL) suppository 650 mg  650 mg Rectal Q6H PRN Synetta Fail, MD       amLODipine (NORVASC) tablet 5 mg  5 mg Oral Daily Hunsucker, Lesia Sago, MD   5 mg at 03/27/23 1914   Chlorhexidine Gluconate Cloth 2 % PADS 6 each  6 each Topical Daily Lorin Glass, MD   6 each at 03/27/23 0430   dextrose 50 % solution 0-50 mL  0-50 mL Intravenous PRN Rocky Morel, DO       docusate sodium (COLACE) capsule 100 mg  100 mg Oral BID Charlott Holler, MD       famotidine (PEPCID) tablet 10 mg  10 mg Oral Daily Charlott Holler, MD       heparin injection 5,000 Units  5,000 Units Subcutaneous Q8H Darl Pikes, MD   5,000 Units at 03/28/23 0515   insulin regular, human (MYXREDLIN) 100 units/ 100 mL infusion   Intravenous Continuous Rocky Morel, DO       ipratropium-albuterol (DUONEB) 0.5-2.5 (3) MG/3ML nebulizer solution 3 mL  3 mL Nebulization Q6H PRN Narda Bonds, MD       labetalol (NORMODYNE) injection 20 mg  20 mg Intravenous Q4H PRN Hunsucker, Lesia Sago, MD   20 mg at 03/27/23 2004   methylPREDNISolone sodium succinate (SOLU-MEDROL) 125 mg/2 mL injection 60 mg  60 mg Intravenous Q24H Rocky Morel, DO   60 mg at 03/27/23 1441   midazolam (VERSED) injection 2 mg  2 mg Intravenous Q4H PRN Lorin Glass, MD   2 mg at 03/27/23 1610   Oral care mouth rinse  15 mL Mouth Rinse PRN Charlott Holler, MD       piperacillin-tazobactam (ZOSYN) IVPB 2.25 g  2.25 g Intravenous Q6H Doristine Counter, RPH   Stopped at 03/28/23 0205   polyethylene glycol (MIRALAX / GLYCOLAX) packet 17 g  17 g Oral Daily PRN Synetta Fail, MD       polyethylene glycol (MIRALAX / GLYCOLAX) packet 17 g  17 g Oral Daily Charlott Holler, MD       sodium bicarbonate tablet 650 mg  650 mg Oral BID Rocky Morel, DO       sodium chloride flush (NS) 0.9 % injection 3 mL  3 mL Intravenous Q12H Synetta Fail, MD   3 mL at 03/27/23 2241   zolpidem (AMBIEN) tablet 5 mg  5 mg Oral QHS PRN Hillary Bow, DO   5 mg at 03/24/23 0211      Review of Systems: 10 systems reviewed and  negative except per interval history/subjective  Physical Exam: Vitals:   03/28/23 0600 03/28/23 0733  BP: (!) 140/102   Pulse: 98   Resp: (!) 25   Temp:  98.4 F (36.9 C)  SpO2: 95%    No intake/output data recorded.  Intake/Output Summary (Last 24 hours) at 03/28/2023 0800 Last data filed at 03/28/2023 0636 Gross per 24 hour  Intake 990.81 ml  Output 1700 ml  Net -709.19 ml   General appearance: Resting in bed with [place] at 15. Appears uncomfortable with increased work of breathing. Head: Normocephalic, without obvious abnormality, atraumatic Neck: no carotid bruit, no JVD, supple, symmetrical, trachea midline, symmetric, no tenderness/mass/nodules Resp: Rales bilateral anterior chest and decreased breath sounds greater on R than L. Tachypnea. Cardio: Tachycardia and regular rhythm GI: Distended, non-tender; bowel sounds normal; no masses,  no organomegaly Extremities:  No LE edema Pulses: 2+ and symmetric PT and DP Skin: Skin color, turgor normal. Dry LE. No purpura, petehiae, rashes or ulcers noted.  Test Results I personally reviewed new and old clinical labs and radiology tests Lab Results  Component Value Date   NA 135 03/28/2023   K 2.7 (LL) 03/28/2023   CL 101 03/28/2023   CO2 15 (L) 03/28/2023   BUN 141 (H) 03/28/2023   CREATININE 5.04 (H) 03/28/2023   CALCIUM 7.3 (L) 03/28/2023   ALBUMIN 1.6 (L) 03/27/2023   PHOS 4.2 03/27/2023    CBC Recent Labs  Lab 03/22/23 1124 03/22/23 1156 03/25/23 0437 03/26/23 0335 03/27/23 0433 03/28/23 0233 03/28/23 0404  WBC 24.5*   < > 19.8*  19.2*   < > 32.6* 64.1* 58.0*  NEUTROABS 22.7*  --  18.5*  --   --   --  51.6*  HGB 10.9*   < > 8.6*  8.7*   < > 8.6* 9.2* 8.8*  HCT 33.7*   < > 27.1*  27.0*   < > 26.4* 28.0* 26.4*  MCV 67.5*   < > 67.9*  67.5*   < > 66.5* 66.2* 66.0*  PLT 187   < > PLATELET CLUMPS NOTED ON SMEAR, UNABLE TO ESTIMATE  252   < > 269 347 310   < > = values in this interval not displayed.

## 2023-03-28 NOTE — Plan of Care (Signed)
  Problem: Fluid Volume: Goal: Hemodynamic stability will improve Outcome: Progressing   Problem: Tissue Perfusion: Goal: Adequacy of tissue perfusion will improve Outcome: Progressing   Problem: Metabolic: Goal: Ability to maintain appropriate glucose levels will improve Outcome: Progressing

## 2023-03-29 ENCOUNTER — Other Ambulatory Visit (HOSPITAL_COMMUNITY): Payer: Self-pay

## 2023-03-29 ENCOUNTER — Inpatient Hospital Stay (HOSPITAL_COMMUNITY): Payer: Self-pay

## 2023-03-29 LAB — PROCALCITONIN: Procalcitonin: 5.71 ng/mL

## 2023-03-29 LAB — RENAL FUNCTION PANEL
Albumin: 1.8 g/dL — ABNORMAL LOW (ref 3.5–5.0)
Anion gap: 14 (ref 5–15)
BUN: 128 mg/dL — ABNORMAL HIGH (ref 6–20)
CO2: 16 mmol/L — ABNORMAL LOW (ref 22–32)
Calcium: 7.6 mg/dL — ABNORMAL LOW (ref 8.9–10.3)
Chloride: 106 mmol/L (ref 98–111)
Creatinine, Ser: 4.35 mg/dL — ABNORMAL HIGH (ref 0.61–1.24)
GFR, Estimated: 16 mL/min — ABNORMAL LOW (ref >60)
Glucose, Bld: 181 mg/dL — ABNORMAL HIGH (ref 70–99)
Phosphorus: 5.7 mg/dL — ABNORMAL HIGH (ref 2.5–4.6)
Potassium: 4.2 mmol/L (ref 3.5–5.1)
Sodium: 136 mmol/L (ref 135–145)

## 2023-03-29 LAB — CBC
HCT: 27.9 % — ABNORMAL LOW (ref 39.0–52.0)
Hemoglobin: 9.3 g/dL — ABNORMAL LOW (ref 13.0–17.0)
MCH: 21.7 pg — ABNORMAL LOW (ref 26.0–34.0)
MCHC: 33.3 g/dL (ref 30.0–36.0)
MCV: 65 fL — ABNORMAL LOW (ref 80.0–100.0)
Platelets: 288 10*3/uL (ref 150–400)
RBC: 4.29 MIL/uL (ref 4.22–5.81)
RDW: 15.9 % — ABNORMAL HIGH (ref 11.5–15.5)
WBC: 47.8 10*3/uL — ABNORMAL HIGH (ref 4.0–10.5)
nRBC: 0.8 % — ABNORMAL HIGH (ref 0.0–0.2)

## 2023-03-29 LAB — GLUCOSE, CAPILLARY
Glucose-Capillary: 155 mg/dL — ABNORMAL HIGH (ref 70–99)
Glucose-Capillary: 130 mg/dL — ABNORMAL HIGH (ref 70–99)
Glucose-Capillary: 105 mg/dL — ABNORMAL HIGH (ref 70–99)
Glucose-Capillary: 67 mg/dL — ABNORMAL LOW (ref 70–99)
Glucose-Capillary: 141 mg/dL — ABNORMAL HIGH (ref 70–99)

## 2023-03-29 MED ORDER — FUROSEMIDE 10 MG/ML IJ SOLN
120.0000 mg | Freq: Two times a day (BID) | INTRAMUSCULAR | Status: DC
  Administered 2023-03-29 – 2023-04-05 (×14): 120 mg via INTRAVENOUS
  Filled 2023-03-29: qty 12
  Filled 2023-03-29 (×3): qty 10
  Filled 2023-03-29 (×2): qty 12
  Filled 2023-03-29 (×6): qty 10
  Filled 2023-03-29: qty 12
  Filled 2023-03-29: qty 2
  Filled 2023-03-29 (×2): qty 10

## 2023-03-29 MED ORDER — FERROUS SULFATE 325 (65 FE) MG PO TABS
325.0000 mg | ORAL_TABLET | Freq: Every day | ORAL | Status: DC
  Administered 2023-03-30 – 2023-04-08 (×9): 325 mg via ORAL
  Filled 2023-03-29 (×10): qty 1

## 2023-03-29 MED ORDER — METOPROLOL TARTRATE 5 MG/5ML IV SOLN: 5.0000 mg | INTRAVENOUS | Status: DC | PRN

## 2023-03-29 MED ORDER — GUAIFENESIN 100 MG/5ML PO LIQD: 5.0000 mL | ORAL | Status: DC | PRN

## 2023-03-29 MED ORDER — PIPERACILLIN-TAZOBACTAM 3.375 G IVPB
3.3750 g | Freq: Three times a day (TID) | INTRAVENOUS | Status: AC
  Administered 2023-03-29 – 2023-03-30 (×6): 3.375 g via INTRAVENOUS
  Filled 2023-03-29 (×8): qty 50

## 2023-03-29 MED ORDER — TRAZODONE HCL 50 MG PO TABS
50.0000 mg | ORAL_TABLET | Freq: Every evening | ORAL | Status: DC | PRN
  Filled 2023-03-29: qty 1

## 2023-03-29 MED ORDER — ARFORMOTEROL TARTRATE 15 MCG/2ML IN NEBU
15.0000 ug | INHALATION_SOLUTION | Freq: Two times a day (BID) | RESPIRATORY_TRACT | Status: DC
  Administered 2023-03-29 – 2023-04-08 (×19): 15 ug via RESPIRATORY_TRACT
  Filled 2023-03-29 (×22): qty 2

## 2023-03-29 MED ORDER — REVEFENACIN 175 MCG/3ML IN SOLN
175.0000 ug | Freq: Every day | RESPIRATORY_TRACT | Status: DC
  Administered 2023-03-29: 175 ug via RESPIRATORY_TRACT
  Filled 2023-03-29: qty 3

## 2023-03-29 MED ORDER — OXYMETAZOLINE HCL 0.05 % NA SOLN
1.0000 | Freq: Once | NASAL | Status: AC
  Administered 2023-03-29: 1 via NASAL
  Filled 2023-03-29: qty 30

## 2023-03-29 MED ORDER — IPRATROPIUM-ALBUTEROL 0.5-2.5 (3) MG/3ML IN SOLN: 3.0000 mL | RESPIRATORY_TRACT | Status: DC | PRN

## 2023-03-29 MED ORDER — ONDANSETRON HCL 4 MG/2ML IJ SOLN
4.0000 mg | Freq: Four times a day (QID) | INTRAMUSCULAR | Status: DC | PRN
  Administered 2023-03-31: 4 mg via INTRAVENOUS
  Filled 2023-03-29: qty 2

## 2023-03-29 MED ORDER — POTASSIUM CHLORIDE CRYS ER 20 MEQ PO TBCR
40.0000 meq | EXTENDED_RELEASE_TABLET | Freq: Once | ORAL | Status: AC
  Administered 2023-03-29: 40 meq via ORAL
  Filled 2023-03-29: qty 2

## 2023-03-29 MED ORDER — SENNOSIDES-DOCUSATE SODIUM 8.6-50 MG PO TABS: 1.0000 | ORAL_TABLET | Freq: Every evening | ORAL | Status: DC | PRN

## 2023-03-29 MED ORDER — HYDRALAZINE HCL 20 MG/ML IJ SOLN: 10.0000 mg | INTRAMUSCULAR | Status: DC | PRN

## 2023-03-29 MED ORDER — PREDNISONE 20 MG PO TABS
40.0000 mg | ORAL_TABLET | Freq: Every day | ORAL | Status: DC
  Administered 2023-03-29 – 2023-03-30 (×2): 40 mg via ORAL
  Filled 2023-03-29 (×2): qty 2

## 2023-03-29 NOTE — Progress Notes (Signed)
NAME:  Gary Conway, MRN:  [id number], DOB:  08/03/1975, LOS: 7 ADMISSION DATE:  03/22/2023, CONSULTATION DATE:  03/24/2023 REFERRING MD:  Caleb Popp, CHIEF COMPLAINT:  Acute hypoxemic respiratory failure   History of Present Illness:  47 year old male with PMH of T2DM, spinal cord angioma s/p resection 2015, and obesity who presented to the ED on 10/25 with 1 week of fevers and malaise and was admitted for DKA and severe AKI.  Presented febrile, tachycardic, hypertensive, tachypneic, and hypoxic responsive to 5 L nasal cannula with labs showing hyponatremia, anion gap metabolic acidosis with a gap of 24, creatinine 5.65 from a baseline of 1.1 (2021), leukocytosis of 24, hemoglobin 10.9, lactic acid 4.4, Pro-Cal 141.  He was found to have bilateral opacities chest x-ray and small right pleural effusion.  Treated for DKA and community-acquired pneumonia.  On 10/27 respiratory status worsened and patient was intubated and transferred to the ICU.  Pertinent  Medical History  T2DM Spinal Hemangioma s/p excision 2015  Significant Hospital Events: Including procedures, antibiotic start and stop dates in addition to other pertinent events   10/25: Admitted with DKA, AKI, bilateral pneumonia w/ possible developing ARDS 10/27: Intubated for worsening respiratory status, bronchoscopy w/ alveolitis, POCUS TTE w/ normal RV function, renal ultrasound WNL.  Antibiotics broadened and pulsed dose steroids added 10/28: passing SBTs 10/30: extubated  Interim History / Subjective:  Subjectively he thinks his breathing is better but is still having trouble controlling his balance.  He still gets short of breath with minor activity.  Objective   Blood pressure (!) 140/100, pulse 99, temperature 98.3 F (36.8 C), temperature source Oral, resp. rate 19, height 5\' 8"  (1.727 m), weight 89 kg, SpO2 95%. CVP:  [11 mmHg] 11 mmHg      Intake/Output Summary (Last 24 hours) at 03/29/2023 0759 Last data filed at  03/29/2023 0500 Gross per 24 hour  Intake 741.97 ml  Output 4350 ml  Net -3608.03 ml   Filed Weights   03/22/23 1122 03/22/23 2056 03/26/23 0701  Weight: 86.2 kg 86.3 kg 89 kg    Examination: Constitutional: intubated middle-age male. HENT: Normocephalic, atraumatic,  Eyes: Sclera non-icteric, PERRL, EOM intact Cardio: regular rate and rhythm. 2+ bilateral radial and dorsalis pedis  pulses. Pulm: Coarse breath sounds bilaterally R>L.  Intubated and ventilated Abdomen: Soft, non-tender, mildly distended, positive bowel sounds. YSA:YTKZSWFU for extremity edema. Skin:Warm and dry. Neuro:Alert and follows commands. No focal deficit noted.  Resolved Hospital Problem list   DKA Severe sepsis  Assessment & Plan:  Acute hypoxemic respiratory failure with ARDS and DAH-likely 2/2 pneumonia vs pulmonary renal syndrome (negative workup so far).  Intubated 10/27, bronchoscopy with BAL consistent with DAH, BAL analysis with neutrophil predominance, CXR with bilateral infiltrates, PaO2/FiO2 initially 180, Pro-Cal 141-60, BNP 450, TTE without significant abnormality. Extubated 10/30 to nasal cannula. Stable on 12-13 L.  Severe AKI vs CKD Stage IV/V, nonoliguric-creatinine 5.7 on admission down to 4.3.  Baseline creatinine unknown and last values from 2021, 1.0. Renal ultrasound without significant abnormality. 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  Workup positive for elevated ESR and CRP as well as RF and aldolase but otherwise negative including ANCA and GBM.  Empirically started pulsed dose steroids with methylprednisolone 1 g daily for 3 days on 10/27 completed 10/29.  Then 60 mg IV methylprednisolone daily and transitioned  to prednisone 40 mg daily today with plans to base taper on renal biopsy results. Severe sepsis,  resolved-likely 2/2 bilateral pneumonia.  Given 3 days of azithromycin and ceftriaxone prior to transfer to ICU.  Improved on vancomycin and Zosyn started 10/27.  Vancomycin removed and continued on Zosyn and azithromycin (completed 5 days on 10/30).  BAL and blood cultures negative to date.  Pro-Cal trending down without recurrence of fevers. Plan for 7 days of zosyn. T2DM- A1c 8.1 on admission, treated for DKA on admission, difficult to control sugar on high dose steroids with plans for long term steroid taper. On and off insulin drip for sugar control but also has an AGMA with possible NAGMA.  Microcytic anemia-admitted with hemoglobin of 10.5, down to 8.7, and stable at 8.6 today. LDH elevated at 240 but blood smear normal, reticulocyte index low, and iron labs consistent with inflammatory anemia (could be IDA with ferritin elevated as acute phase reactant) Hypertension- pt self tapered off amlodipine at home, BP elevated after extubation and amlodipine 5 mg added 10/30  Plan Continue to wean O2 as tolerated Prednisone 40 mg daily Renal biopsy Continue strict ins and outs Continue Zosyn, day 5\6/7 (complete 11/2)  Remain on progressive level care but will stay in ICU for close respiratory monitoring.  Best Practice (right click and "Reselect all SmartList Selections" daily)   Diet/type: tubefeeds DVT prophylaxis: prophylactic heparin  GI prophylaxis: H2B Lines: Central line 10/27 Foley:  Yes, and it is still needed Code Status:  full code Last date of multidisciplinary goals of care discussion [03/29/2023]  Labs   CBC: Recent Labs  Lab 03/22/23 1124 03/22/23 1156 03/25/23 0437 03/26/23 0335 03/27/23 0433 03/28/23 0233 03/28/23 0404 03/28/23 1008 03/29/23 0331  WBC 24.5*   < > 19.8*  19.2* 38.3* 32.6* 64.1* 58.0*  --  47.8*  NEUTROABS 22.7*  --  18.5*  --   --   --  51.6*  --   --   HGB 10.9*   < > 8.6*  8.7* 9.4* 8.6* 9.2* 8.8* 9.9* 9.3*  HCT 33.7*   < > 27.1*  27.0*  27.9* 26.4* 28.0* 26.4* 29.0* 27.9*  MCV 67.5*   < > 67.9*  67.5* 65.8* 66.5* 66.2* 66.0*  --  65.0*  PLT 187   < > PLATELET CLUMPS NOTED ON SMEAR, UNABLE TO ESTIMATE  252 309 269 347 310  --  288   < > = values in this interval not displayed.    Basic Metabolic Panel: Recent Labs  Lab 03/25/23 1738 03/26/23 0335 03/26/23 1702 03/27/23 0433 03/27/23 1735 03/28/23 0233 03/28/23 1000 03/28/23 1008 03/28/23 1547 03/29/23 0331  NA  --  134*  --  143 143 135 136 137 134* 136  K  --  3.4*  --  3.2* 3.3* 2.7* 3.3* 3.4* 3.5 4.2  CL  --  100  --  109 110 101 105  --  103 106  CO2  --  16*  --  18* 17* 15* 16*  --  17* 16*  GLUCOSE  --  319*  --  160* 209* 242* 247*  --  146* 181*  BUN  --  110*  --  138* 140* 141* 130*  --  131* 128*  CREATININE  --  4.86*  --  5.06* 4.72* 5.04* 4.51*  --  4.52* 4.35*  CALCIUM  --  7.8*  --  7.7* 7.6* 7.3* 7.4*  --  7.7* 7.6*  MG 2.8* 3.1* 3.0*  2.9*  --   --  2.4  --   --   --   PHOS 5.1* 5.3* 4.3 4.9* 4.2  --  4.5  --  3.1 5.7*   GFR: Estimated Creatinine Clearance: 22.3 mL/min (A) (by C-G formula based on SCr of 4.35 mg/dL (H)). Recent Labs  Lab 03/22/23 1149 03/23/23 0656 03/23/23 1126 03/24/23 0352 03/25/23 0144 03/25/23 0437 03/27/23 0433 03/28/23 0233 03/28/23 0404 03/29/23 0331  PROCALCITON  --  141.68  --  82.45 58.82  --   --   --   --   --   WBC  --  20.3*  --  28.7* 16.8*   < > 32.6* 64.1* 58.0* 47.8*  LATICACIDVEN 4.4* 1.7 1.7  --   --   --   --   --   --   --    < > = values in this interval not displayed.    Liver Function Tests: Recent Labs  Lab 03/22/23 1124 03/27/23 1735 03/28/23 1000 03/28/23 1547 03/29/23 0331  AST 62*  --   --   --   --   ALT 33  --   --   --   --   ALKPHOS 85  --   --   --   --   BILITOT 1.8*  --   --   --   --   PROT 7.7  --   --   --   --   ALBUMIN 2.4* 1.6* 1.7* 1.8* 1.8*   No results for input(s): "LIPASE", "AMYLASE" in the last 168 hours. No results for input(s): "AMMONIA" in the  last 168 hours.  ABG    Component Value Date/Time   PHART 7.392 03/28/2023 1008   PCO2ART 24.8 (L) 03/28/2023 1008   PO2ART 54 (L) 03/28/2023 1008   HCO3 15.0 (L) 03/28/2023 1008   TCO2 16 (L) 03/28/2023 1008   ACIDBASEDEF 9.0 (H) 03/28/2023 1008   O2SAT 88 03/28/2023 1008     Coagulation Profile: Recent Labs  Lab 03/23/23 0656  INR 1.3*    Cardiac Enzymes: Recent Labs  Lab 03/24/23 1830  CKTOTAL 377    HbA1C: Hgb A1c MFr Bld  Date/Time Value Ref Range Status  03/22/2023 06:06 PM 8.1 (H) 4.8 - 5.6 % Final    Comment:    (NOTE) Pre diabetes:          5.7%-6.4%  Diabetes:              >6.4%  Glycemic control for   <7.0% adults with diabetes   09/13/2019 02:26 AM 12.9 (H) 4.8 - 5.6 % Final    Comment:    (NOTE) Pre diabetes:          5.7%-6.4% Diabetes:              >6.4% Glycemic control for   <7.0% adults with diabetes     CBG: Recent Labs  Lab 03/28/23 0735 03/28/23 1119 03/28/23 1545 03/28/23 2108 03/29/23 0715  GLUCAP 153* 236* 142* 132* 155*    Review of Systems:   Loose bowel movements, dyspnea on exertion.  Past Medical History:  He,  has a past medical history of Diabetes mellitus, type II (HCC) (2015) and Hemangioma of spine (2015).   Surgical History:   Past Surgical History:  Procedure Laterality Date   LAMINECTOMY N/A 03/22/2014   Procedure: THORACIC two - four  LAMINECTOMY FOR TUMOR;  Surgeon: Temple Pacini, MD;  Location: [facility];  Service: Neurosurgery;  Laterality: N/A;  Throacic two  - four laminectomy for tumor   LEG SURGERY       Social History:   reports that he has never smoked. He has never used smokeless tobacco. He reports current alcohol use. He reports that he does not use drugs.   Family History:  His family history includes Breast cancer in his mother.   Allergies Allergies  Allergen Reactions   Other Hives and Itching    Ketchup & Mustard     Home Medications  Prior to Admission medications    Medication Sig Start Date End Date Taking? Authorizing Provider  amLODipine (NORVASC) 5 MG tablet Take 5 mg by mouth daily.   Yes [provider]  metFORMIN (GLUCOPHAGE) 500 MG tablet Take 1 tablet (500 mg total) by mouth 2 (two) times daily with a meal. 09/14/19  Yes Leroy Sea, MD  blood glucose meter kit and supplies KIT Dispense based on patient and insurance preference. Use up to four times daily as directed. (FOR ICD-9 250.00, 250.01). For QAC - HS accuchecks. 09/14/19   Leroy Sea, MD     Critical care time: 47    Rocky Morel, DO Internal Medicine Resident, PGY-2 Pager# [phone] [facility] 03/29/2023 7:59 AM  For contact information, see Amion. If no response to pager, please call PCCM consult pager. After hours, 7PM- 7AM, please call Elink.

## 2023-03-29 NOTE — Plan of Care (Signed)
  Problem: Fluid Volume: Goal: Hemodynamic stability will improve Outcome: Progressing   Problem: Clinical Measurements: Goal: Diagnostic test results will improve Outcome: Progressing Goal: Signs and symptoms of infection will decrease Outcome: Progressing   Problem: Respiratory: Goal: Ability to maintain adequate ventilation will improve Outcome: Progressing   Problem: Metabolic: Goal: Ability to maintain appropriate glucose levels will improve Outcome: Progressing   Problem: Clinical Measurements: Goal: Ability to maintain clinical measurements within normal limits will improve Outcome: Progressing Goal: Respiratory complications will improve Outcome: Progressing

## 2023-03-29 NOTE — Progress Notes (Signed)
Pharmacy Antibiotic Note  Gary Conway is a 47 y.o. male admitted on 03/22/2023 with sepsis secondary to aspiration pneumonia. Patient presented with hyperglycemia and AKI. Pharmacy has been consulted for Zosyn (piperacillin-tazobactam) dosing.  WBC 47.8 (trending down from 64.1), Tmax 98.4 SCr @ 4.35, good urine output with Lasix 120 mg yesterday (unknown baseline, last Scr ~1 from 2021)  Plan: Increase Zosyn to 3.375 g q8h for CrCl > 20. Stop date of 03/30/23.  Pharmacy will sign off, please reconsult as needed.   Height: 5\' 8"  (172.7 cm) Weight: 89 kg (196 lb 3.4 oz) IBW/kg (Calculated) : 68.4  Temp (24hrs), Avg:98.4 F (36.9 C), Min:97.7 F (36.5 C), Max:99.3 F (37.4 C)  Recent Labs  Lab 03/22/23 1149 03/22/23 1806 03/23/23 0656 03/23/23 1126 03/24/23 0352 03/26/23 0335 03/27/23 0433 03/27/23 1735 03/28/23 0233 03/28/23 0404 03/28/23 1000 03/28/23 1547 03/29/23 0331  WBC  --   --  20.3*  --    < > 38.3* 32.6*  --  64.1* 58.0*  --   --  47.8*  CREATININE  --    < > 5.27*  --    < > 4.86* 5.06* 4.72* 5.04*  --  4.51* 4.52* 4.35*  LATICACIDVEN 4.4*  --  1.7 1.7  --   --   --   --   --   --   --   --   --    < > = values in this interval not displayed.    Estimated Creatinine Clearance: 22.3 mL/min (A) (by C-G formula based on SCr of 4.35 mg/dL (H)).    Allergies  Allergen Reactions   Other Hives and Itching    Ketchup & Mustard    Antimicrobials this admission: Azithromycin 10/25 >> 10/29 Ceftriaxone 10/25 >> 10/27 Zosyn 10/27 >>  Vancomycin 10/27 >> 10/28  Dose adjustments this admission: N/A  Microbiology results: 10/25 BCx: negative  10/27 BAL Cx: negative  10/27 MRSA PCR: negative  Thank you for allowing pharmacy to be a part of this patient's care.  Cedric Fishman, PharmD, BCPS, BCCCP Clinical Pharmacist   Please refer to [facility] for pharmacy phone number

## 2023-03-29 NOTE — Progress Notes (Signed)
PROGRESS NOTE    Gary Conway  ZOX:[id number] DOB: 1976/02/13 DOA: 03/22/2023 PCP: Pcp, No     Brief Narrative:  47 year old with history of DM 2, spinal cord angioma s/p resection 2015, obesity presented to the ED on 10/25 with 1 week of fever and malaise prior to admission.  Patient was found to have DKA and AKI with signs of sepsis and significant other metabolic abnormalities.  Procalcitonin was ordered 41 and chest x-ray showing significant infiltrate bilaterally.  Initially due to concerns of ARDS development, he was intubated on 10/27 and eventually extubated on 10/30.  In the meantime patient received antibiotics, nephrology team has been following.  There has been concerns of pulmonary renal syndrome therefore currently on steroids.     Assessment & Plan:  Principal Problem:   Severe sepsis (HCC) Active Problems:   Obesity (BMI 30-39.9)   DM (diabetes mellitus) (HCC)   Benign neoplasm of spinal cord (HCC)   CAP (community acquired pneumonia)   Acute hypoxemic respiratory failure (HCC)   Acute respiratory failure secondary to ARDS/DAH Severe sepsis secondary to multifocal community-acquired pneumonia -Initially intubated on 10/27 and extubated 10/30.  Bronchoscopy status post BAL (10/27).  Patient still remains on 13 L high flow.  Will slowly wean off as appropriate.  Currently patient is on steroids, IV Zosyn, last day 11/2.  Will trend procalcitonin, periodically check BNP.  Chest x-ray shows persistent right-sided opacity, will defer need for CT scan/thoracentesis to pulmonary team. -Bronchodilators, I-S/flutter valve.   Acute kidney injury on CKD stage V Uremia -Creatinine 2021 was 1.1.  Admission creatinine 5.7 > 4.35.  Renal ultrasound is negative.  Nephro considering renal biopsy.  Diuretics/fluid therapy per nephro.  Will defer need of sodium bicarb to nephrology.   Diabetic ketoacidosis History of diabetes mellitus type 2 - DKA is now resolved.  Sugars are  difficult to control as patient is still on steroids.  Will adjust insulin as necessary.   Essential hypertension -Norvasc, Coreg.  IV as needed   Microcytic anemia -Low iron saturations.  Will place him on supplements.       DVT prophylaxis: heparin injection 5,000 Units Start: 03/25/23 0600 Code Status: Full code Family Communication:   Status is: Inpatient Remains inpatient appropriate because: Continue hospital stay as patient still significantly hypoxic.       Subjective: Seen at bedside, still slightly tachypneic but overall comfortable at rest.     Examination:   General exam: Appears calm and comfortable, 13 L high flow Respiratory system: Diminished breath sounds on the right side Cardiovascular system: S1 & S2 heard, RRR. No JVD, murmurs, rubs, gallops or clicks. No pedal edema. Gastrointestinal system: Abdomen is nondistended, soft and nontender. No organomegaly or masses felt. Normal bowel sounds heard. Central nervous system: Alert and oriented. No focal neurological deficits. Extremities: Symmetric 5 x 5 power. Skin: No rashes, lesions or ulcers Psychiatry: Judgement and insight appear normal. Mood & affect appropriate.         Diet Orders (From admission, onward)     Start     Ordered   03/27/23 1610  Diet Carb Modified Fluid consistency: Thin; Room service appropriate? Yes with Assist  Diet effective now       Question Answer Comment  Diet-HS Snack? Nothing   Calorie Level Medium 1600-2000   Fluid consistency: Thin   Room service appropriate? Yes with Assist      03/27/23 1610            Objective: Vitals:  03/29/23 1000 03/29/23 1015 03/29/23 1100 03/29/23 1200  BP: 119/81  132/85 120/85  Pulse: 98 95 91 93  Resp: (!) 25 (!) 26 (!) 23 18  Temp:      TempSrc:      SpO2: 94% 96% 95% 93%  Weight:      Height:        Intake/Output Summary (Last 24 hours) at 03/29/2023 1215 Last data filed at 03/29/2023 1100 Gross per 24 hour  Intake  412.03 ml  Output 3450 ml  Net -3037.97 ml   Filed Weights   03/22/23 1122 03/22/23 2056 03/26/23 0701  Weight: 86.2 kg 86.3 kg 89 kg    Scheduled Meds:  amLODipine  5 mg Oral Daily   arformoterol  15 mcg Nebulization BID   carvedilol  6.25 mg Oral BID WC   Chlorhexidine Gluconate Cloth  6 each Topical Daily   famotidine  10 mg Oral Daily   [START ON 03/30/2023] ferrous sulfate  325 mg Oral Q breakfast   heparin  5,000 Units Subcutaneous Q8H   insulin aspart  0-20 Units Subcutaneous TID WC   insulin aspart  0-5 Units Subcutaneous QHS   insulin aspart  10 Units Subcutaneous TID WC   insulin glargine-yfgn  20 Units Subcutaneous BID   predniSONE  40 mg Oral Q lunch   revefenacin  175 mcg Nebulization Daily   sodium chloride flush  3 mL Intravenous Q12H   Continuous Infusions:  furosemide     piperacillin-tazobactam 12.5 mL/hr at 03/29/23 1100    Nutritional status Signs/Symptoms: NPO status Interventions: Refer to RD note for recommendations Body mass index is 29.83 kg/m.  Data Reviewed:   CBC: Recent Labs  Lab 03/25/23 0437 03/26/23 0335 03/27/23 0433 03/28/23 0233 03/28/23 0404 03/28/23 1008 03/29/23 0331  WBC 19.8*  19.2* 38.3* 32.6* 64.1* 58.0*  --  47.8*  NEUTROABS 18.5*  --   --   --  51.6*  --   --   HGB 8.6*  8.7* 9.4* 8.6* 9.2* 8.8* 9.9* 9.3*  HCT 27.1*  27.0* 27.9* 26.4* 28.0* 26.4* 29.0* 27.9*  MCV 67.9*  67.5* 65.8* 66.5* 66.2* 66.0*  --  65.0*  PLT PLATELET CLUMPS NOTED ON SMEAR, UNABLE TO ESTIMATE  252 309 269 347 310  --  288   Basic Metabolic Panel: Recent Labs  Lab 03/25/23 1738 03/26/23 0335 03/26/23 1702 03/27/23 0433 03/27/23 1735 03/28/23 0233 03/28/23 1000 03/28/23 1008 03/28/23 1547 03/29/23 0331  NA  --  134*  --  143 143 135 136 137 134* 136  K  --  3.4*  --  3.2* 3.3* 2.7* 3.3* 3.4* 3.5 4.2  CL  --  100  --  109 110 101 105  --  103 106  CO2  --  16*  --  18* 17* 15* 16*  --  17* 16*  GLUCOSE  --  319*  --  160* 209*  242* 247*  --  146* 181*  BUN  --  110*  --  138* 140* 141* 130*  --  131* 128*  CREATININE  --  4.86*  --  5.06* 4.72* 5.04* 4.51*  --  4.52* 4.35*  CALCIUM  --  7.8*  --  7.7* 7.6* 7.3* 7.4*  --  7.7* 7.6*  MG 2.8* 3.1* 3.0* 2.9*  --   --  2.4  --   --   --   PHOS 5.1* 5.3* 4.3 4.9* 4.2  --  4.5  --  3.1 5.7*   GFR: Estimated Creatinine Clearance: 22.3 mL/min (A) (by C-G formula based on SCr of 4.35 mg/dL (H)). Liver Function Tests: Recent Labs  Lab 03/27/23 1735 03/28/23 1000 03/28/23 1547 03/29/23 0331  ALBUMIN 1.6* 1.7* 1.8* 1.8*   No results for input(s): "LIPASE", "AMYLASE" in the last 168 hours. No results for input(s): "AMMONIA" in the last 168 hours. Coagulation Profile: Recent Labs  Lab 03/23/23 0656  INR 1.3*   Cardiac Enzymes: Recent Labs  Lab 03/24/23 1830  CKTOTAL 377   BNP (last 3 results) No results for input(s): "PROBNP" in the last 8760 hours. HbA1C: No results for input(s): "HGBA1C" in the last 72 hours. CBG: Recent Labs  Lab 03/28/23 0735 03/28/23 1119 03/28/23 1545 03/28/23 2108 03/29/23 0715  GLUCAP 153* 236* 142* 132* 155*   Lipid Profile: No results for input(s): "CHOL", "HDL", "LDLCALC", "TRIG", "CHOLHDL", "LDLDIRECT" in the last 72 hours. Thyroid Function Tests: No results for input(s): "TSH", "T4TOTAL", "FREET4", "T3FREE", "THYROIDAB" in the last 72 hours. Anemia Panel: No results for input(s): "VITAMINB12", "FOLATE", "FERRITIN", "TIBC", "IRON", "RETICCTPCT" in the last 72 hours. Sepsis Labs: Recent Labs  Lab 03/23/23 0656 03/23/23 1126 03/24/23 0352 03/25/23 0144  PROCALCITON 141.68  --  82.45 58.82  LATICACIDVEN 1.7 1.7  --   --     Recent Results (from the past 240 hour(s))  Culture, blood (routine x 2)     Status: None   Collection Time: 03/22/23 11:43 AM   Specimen: BLOOD  Result Value Ref Range Status   Specimen Description BLOOD BLOOD LEFT FOREARM  Final   Special Requests   Final    BOTTLES DRAWN AEROBIC AND  ANAEROBIC Blood Culture adequate volume   Culture   Final    NO GROWTH 5 DAYS Performed at [facility], [address] [place], [place] [address]    Report Status 03/27/2023 FINAL  Final  Resp panel by RT-PCR (RSV, Flu A&B, Covid) Anterior Nasal Swab     Status: None   Collection Time: 03/22/23 11:45 AM   Specimen: Anterior Nasal Swab  Result Value Ref Range Status   SARS Coronavirus 2 by RT PCR NEGATIVE NEGATIVE Final   Influenza A by PCR NEGATIVE NEGATIVE Final   Influenza B by PCR NEGATIVE NEGATIVE Final    Comment: (NOTE) The Xpert Xpress SARS-CoV-2/FLU/RSV plus assay is intended as an aid in the diagnosis of influenza from Nasopharyngeal swab specimens and should not be used as a sole basis for treatment. Nasal washings and aspirates are unacceptable for Xpert Xpress SARS-CoV-2/FLU/RSV testing.  Fact Sheet for Patients: BloggerCourse.com  Fact Sheet for Healthcare Providers: SeriousBroker.it  This test is not yet approved or cleared by the [place] FDA and has been authorized for detection and/or diagnosis of SARS-CoV-2 by FDA under an Emergency Use Authorization (EUA). This EUA will remain in effect (meaning this test can be used) for the duration of the COVID-19 declaration under Section 564(b)(1) of the Act, 21 U.S.C. section 360bbb-3(b)(1), unless the authorization is terminated or revoked.     Resp Syncytial Virus by PCR NEGATIVE NEGATIVE Final    Comment: (NOTE) Fact Sheet for Patients: BloggerCourse.com  Fact Sheet for Healthcare Providers: SeriousBroker.it  This test is not yet approved or cleared by the [place] FDA and has been authorized for detection and/or diagnosis of SARS-CoV-2 by FDA under an Emergency Use Authorization (EUA). This EUA will remain in effect (meaning this test can be used) for the duration of the COVID-19  declaration  under Section 564(b)(1) of the Act, 21 U.S.C. section 360bbb-3(b)(1), unless the authorization is terminated or revoked.  Performed at [facility], [address], [place], [place] [address]   Culture, blood (routine x 2)     Status: None   Collection Time: 03/22/23  1:25 PM   Specimen: BLOOD  Result Value Ref Range Status   Specimen Description BLOOD RIGHT ANTECUBITAL  Final   Special Requests   Final    BOTTLES DRAWN AEROBIC AND ANAEROBIC Blood Culture results may not be optimal due to an excessive volume of blood received in culture bottles   Culture   Final    NO GROWTH 5 DAYS Performed at [facility], [address] [place], [place] [address]    Report Status 03/27/2023 FINAL  Final  MRSA Next Gen by PCR, Nasal     Status: None   Collection Time: 03/24/23 12:11 PM   Specimen: Nasal Mucosa; Nasal Swab  Result Value Ref Range Status   MRSA by PCR Next Gen NOT DETECTED NOT DETECTED Final    Comment: (NOTE) The GeneXpert MRSA Assay (FDA approved for NASAL specimens only), is one component of a comprehensive MRSA colonization surveillance program. It is not intended to diagnose MRSA infection nor to guide or monitor treatment for MRSA infections. Test performance is not FDA approved in patients less than 62 years old. Performed at [facility], [address], [place], [place] [address]   Culture, Respiratory w Gram Stain     Status: None   Collection Time: 03/24/23 12:43 PM   Specimen: Bronchoalveolar Lavage; Respiratory  Result Value Ref Range Status   Specimen Description BRONCHIAL ALVEOLAR LAVAGE  Final   Special Requests NONE  Final   Gram Stain   Final    RARE WBC SEEN RARE SQUAMOUS EPITHELIAL CELLS PRESENT NO ORGANISMS SEEN    Culture   Final    NO GROWTH 2 DAYS Performed at [facility], [address], [place], [place] [address]    Report Status 03/27/2023 FINAL  Final         Radiology Studies: DG Chest Port 1 View  Result  Date: 03/28/2023 CLINICAL DATA:  47 year old male with history of acute respiratory failure. EXAM: PORTABLE CHEST 1 VIEW COMPARISON:  Chest x-ray 03/26/2023. FINDINGS: There is a left-sided internal jugular central venous catheter with tip terminating in the distal superior vena cava. Previously noted nasogastric and endotracheal tubes have been removed. Lung volumes are low. Elevation of the right hemidiaphragm. Opacity at the right base which may reflect atelectasis and/or consolidation, with superimposed moderate to large right pleural effusion. Widespread patchy interstitial and airspace disease scattered throughout the lungs bilaterally. Heart size is enlarged. Upper mediastinal contours are distorted by patient positioning. IMPRESSION: 1. Support apparatus, as above. 2. The appearance of the chest is favored to reflect multilobar bilateral bronchopneumonia, although some degree of underlying heart failure is also suspected given the cardiac enlargement and diffuse interstitial prominence. 3. Moderate to large right pleural effusion. Electronically Signed   By: Trudie Reed M.D.   On: 03/28/2023 06:07           LOS: 7 days   Time spent= 35 mins    Miguel Rota, MD [facility]  If 7PM-7AM, please contact night-coverage  03/29/2023, 12:15 PM

## 2023-03-29 NOTE — Progress Notes (Signed)
Admit: 03/22/2023 LOS: 7  37M renal failure of unclear chronicity, AHRF with findings of alveolar hemorrhage on bronchoscopy.  Vasculitic workup negative.  Subjective:  Creatinine slightly improved, BUN likewise. Potassium 4.2, bicarbonate 16. 4.4 L urine output yesterday Remains on high flow nasal cannula, 10 to 12 L Blood pressures are stable IR following for kidney biopsy  10/31 0701 - 11/01 0700 In: 742 [P.O.:480; IV Piggyback:262] Out: 4350 [Urine:4350]  Filed Weights   03/22/23 1122 03/22/23 2056 03/26/23 0701  Weight: 86.2 kg 86.3 kg 89 kg    Scheduled Meds:  amLODipine  5 mg Oral Daily   arformoterol  15 mcg Nebulization BID   carvedilol  6.25 mg Oral BID WC   Chlorhexidine Gluconate Cloth  6 each Topical Daily   famotidine  10 mg Oral Daily   [START ON 03/30/2023] ferrous sulfate  325 mg Oral Q breakfast   heparin  5,000 Units Subcutaneous Q8H   insulin aspart  0-20 Units Subcutaneous TID WC   insulin aspart  0-5 Units Subcutaneous QHS   insulin aspart  10 Units Subcutaneous TID WC   insulin glargine-yfgn  20 Units Subcutaneous BID   predniSONE  40 mg Oral Q lunch   revefenacin  175 mcg Nebulization Daily   sodium chloride flush  3 mL Intravenous Q12H   Continuous Infusions:  piperacillin-tazobactam 3.375 g (03/29/23 0913)   PRN Meds:.acetaminophen **OR** acetaminophen, guaiFENesin, hydrALAZINE, ipratropium-albuterol, metoprolol tartrate, midazolam, ondansetron (ZOFRAN) IV, mouth rinse, polyethylene glycol, senna-docusate, traZODone  Current Labs: reviewed   Latest Reference Range & Units 03/24/23 18:30  Anti Nuclear Antibody (ANA) Negative  Negative  ANA Ab, IFA  Negative  ASO 0.0 - 200.0 IU/mL 25.0  CCP Antibodies IgG/IgA 0 - 19 units 6  ds DNA Ab 0 - 9 IU/mL <1  GBM Ab 0.0 - 0.9 units <0.2  RA Latex Turbid. <14.0 IU/mL 23.9 (H)  Cytoplasmic (C-ANCA) Neg:<1:20 titer Neg:<1:20 titer <1:20 <1:20  P-ANCA Neg:<1:20 titer Neg:<1:20 titer <1:20 <1:20   Atypical P-ANCA titer Neg:<1:20 titer Neg:<1:20 titer <1:20 <1:20  Anti-MPO Antibodies 0.0 - 0.9 units 0.0 - 0.9 units <0.2 <0.2  Anti-PR3 Antibodies 0.0 - 0.9 units 0.0 - 0.9 units 0.6 0.6  C3 Complement 82 - 167 mg/dL 82 - 124 mg/dL 580 998  Complement C4, Body Fluid 12 - 38 mg/dL 36  (H): Data is abnormally high  Physical Exam:  Blood pressure (!) 143/97, pulse (!) 102, temperature 98.3 F (36.8 C), temperature source Oral, resp. rate (!) 26, height 5\' 8"  (1.727 m), weight 89 kg, SpO2 95%. Conversant, mild increased work of breathing with tachypnea Regular, borderline tachycardia, normal S1 and S2 Coarse breath sounds bilaterally No significant edema Nonfocal, CN II through XII intact Soft, nontender  A Renal failure of unclear chronicity, nonoliguric Question of GN because of problem #2 Serologies negative Urine without strong suggestion of nephritic sediment Given ongoing uncertainty about pulmonary and renal status plan is for kidney biopsy when stable The paper kidney biopsy requisition form is in the paper chart Diuresed well with Lasix 120 mg IV twice daily yesterday I think very unlikely to have active glomerular disease Azotemia multifactorial including low GFR, high-protein enteral feeds, corticosteroids AHRF with bronchoscopic findings of DAH, serologies as above. Completed pulse Solu-Medrol currently on prednisone 40 mg daily Remains with significant oxygen requirement, PCCM following Completing antibiotics for pneumonia Hypokalemia, improved; continue repletion as necessary Metabolic acidosis, stable DM2, DKA at admission, improved Leukocytosis, improving, likely multifactorial Anemia  P Repeat Lasix  120 mg IV twice daily today No indications for dialysis Kidney biopsy based upon IR schedule Medication Issues; Preferred narcotic agents for pain control are hydromorphone, fentanyl, and methadone. Morphine should not be used.  Baclofen should be  avoided Avoid oral sodium phosphate and magnesium citrate based laxatives / bowel preps    Sabra Heck MD 03/29/2023, 10:20 AM  Recent Labs  Lab 03/28/23 1000 03/28/23 1008 03/28/23 1547 03/29/23 0331  NA 136 137 134* 136  K 3.3* 3.4* 3.5 4.2  CL 105  --  103 106  CO2 16*  --  17* 16*  GLUCOSE 247*  --  146* 181*  BUN 130*  --  131* 128*  CREATININE 4.51*  --  4.52* 4.35*  CALCIUM 7.4*  --  7.7* 7.6*  PHOS 4.5  --  3.1 5.7*   Recent Labs  Lab 03/22/23 1124 03/22/23 1156 03/25/23 0437 03/26/23 0335 03/28/23 0233 03/28/23 0404 03/28/23 1008 03/29/23 0331  WBC 24.5*   < > 19.8*  19.2*   < > 64.1* 58.0*  --  47.8*  NEUTROABS 22.7*  --  18.5*  --   --  51.6*  --   --   HGB 10.9*   < > 8.6*  8.7*   < > 9.2* 8.8* 9.9* 9.3*  HCT 33.7*   < > 27.1*  27.0*   < > 28.0* 26.4* 29.0* 27.9*  MCV 67.5*   < > 67.9*  67.5*   < > 66.2* 66.0*  --  65.0*  PLT 187   < > PLATELET CLUMPS NOTED ON SMEAR, UNABLE TO ESTIMATE  252   < > 347 310  --  288   < > = values in this interval not displayed.

## 2023-03-29 NOTE — Consult Note (Signed)
Chief Complaint: Patient was seen in consultation today for random renal biopsy Chief Complaint  Patient presents with   Fever   at the request of Dr Britt Bottom   Supervising Physician: Gilmer Mor  Patient Status: [facility] - In-pt  History of Present Illness: Gary Conway is a 47 y.o. 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 Vasculitic workup negative.  Renal failure of unclear chronicity, nonoliguric Question of GN because of problem #2 Serologies negative Urine without strong suggestion of nephritic sediment Given ongoing uncertainty about pulmonary and renal status plan is for kidney biopsy when stable The paper kidney biopsy requisition form is in the paper chart Diuresed well with Lasix 120 mg IV twice daily yesterday I think very unlikely to have active glomerular disease Azotemia multifactorial including low GFR, high-protein enteral feeds, corticosteroids  Scheduled for random renal biopsy when pt is stable Better daily Still on steroids 10L [place] Carries a conversation A/O  Request for random renal biopsy Scheduled for possible 11/4   Past Medical History:  Diagnosis Date   Diabetes mellitus, type II (HCC) 2015   Diagnosed during 2015 admission to [facility]   Hemangioma of spine 2015   S/P excision at [facility] with pathology favoring benign Hemangioma    Past Surgical History:  Procedure Laterality Date   LAMINECTOMY N/A 03/22/2014   Procedure: THORACIC two - four  LAMINECTOMY FOR TUMOR;  Surgeon: Temple Pacini, MD;  Location: [facility] NEURO ORS;  Service: Neurosurgery;  Laterality: N/A;  Throacic two  - four laminectomy for tumor   LEG SURGERY       Allergies: Other  Medications: Prior to Admission medications   Medication Sig Start Date End Date Taking? Authorizing Provider  amLODipine (NORVASC) 5 MG tablet Take 5 mg by mouth daily.   Yes [provider]  metFORMIN (GLUCOPHAGE) 500 MG tablet Take 1 tablet (500 mg total) by mouth 2 (two) times daily with a meal. 09/14/19  Yes Leroy Sea, MD  blood glucose meter kit and supplies KIT Dispense based on patient and insurance preference. Use up to four times daily as directed. (FOR ICD-9 250.00, 250.01). For QAC - HS accuchecks. 09/14/19   Leroy Sea, MD     Family History  Problem Relation Age of Onset   Breast cancer Mother     Social History   Socioeconomic History   Marital status: Single    Spouse name: Not on file   Number of children: Not on file   Years of education: Not on file   Highest education level: Not on file  Occupational History   Not on file  Tobacco Use   Smoking status: Never   Smokeless tobacco: Never  Substance and Sexual Activity   Alcohol use: Yes   Drug use: No   Sexual activity: Not Currently  Other Topics Concern   Not on file  Social History Narrative   Not on file   Social Determinants of Health   Financial Resource Strain: Not on file  Food Insecurity: No Food Insecurity (03/22/2023)   Hunger Vital Sign    Worried About Running Out of Food in the Last Year: Never true    Ran Out of Food in the Last Year: Never true  Transportation Needs: No Transportation Needs (03/22/2023)   PRAPARE - Administrator, Civil Service (Medical): No    Lack of Transportation (Non-Medical): No  Physical Activity: Not on file  Stress: Not on file  Social Connections: Not on file    Review of Systems: A 12 point ROS discussed and pertinent positives are indicated in the HPI above.  All other systems are negative.  Review of Systems  Constitutional:  Positive for activity change and fatigue. Negative for fever.   Respiratory:  Positive for shortness of breath.   Cardiovascular:  Negative for chest pain.  Neurological:  Positive for weakness.  Psychiatric/Behavioral:  Positive for decreased concentration. Negative for behavioral problems.     Vital Signs: BP 120/85   Pulse 93   Temp 98.4 F (36.9 C) (Oral)   Resp 18   Ht 5\' 8"  (1.727 m)   Wt 196 lb 3.4 oz (89 kg)   SpO2 93%   BMI 29.83 kg/m     Physical Exam Vitals reviewed.  HENT:     Mouth/Throat:     Mouth: Mucous membranes are moist.  Cardiovascular:     Rate and Rhythm: Regular rhythm. Tachycardia present.     Heart sounds: Normal heart sounds.  Pulmonary:     Effort: Pulmonary effort is normal.     Breath sounds: Wheezing present.  Abdominal:     Palpations: Abdomen is soft.     Tenderness: There is no abdominal tenderness.  Musculoskeletal:        General: Normal range of motion.  Skin:    General: Skin is warm.  Neurological:     Mental Status: He is alert.     Comments: Answers all questions correctly Mon in room Pt is unable to sign consent---- shakiness and weak  Mother has signed consent and agrees to IR procedure  Psychiatric:        Behavior: Behavior normal.     Imaging: DG Chest Port 1 View  Result Date: 03/28/2023 CLINICAL DATA:  47 year old male with history of acute respiratory failure. EXAM: PORTABLE CHEST 1 VIEW COMPARISON:  Chest x-ray 03/26/2023. FINDINGS: There is a left-sided internal jugular central venous catheter with tip terminating in the distal superior vena cava. Previously noted nasogastric and endotracheal tubes have been removed. Lung volumes are low. Elevation of the right hemidiaphragm. Opacity at the right base which may reflect atelectasis and/or consolidation, with superimposed moderate to large right pleural effusion. Widespread patchy interstitial and airspace disease scattered throughout the lungs bilaterally. Heart size is enlarged. Upper mediastinal contours are distorted by  patient positioning. IMPRESSION: 1. Support apparatus, as above. 2. The appearance of the chest is favored to reflect multilobar bilateral bronchopneumonia, although some degree of underlying heart failure is also suspected given the cardiac enlargement and diffuse interstitial prominence. 3. Moderate to large right pleural effusion. Electronically Signed   By: Trudie Reed M.D.   On: 03/28/2023 06:07   DG CHEST PORT 1 VIEW  Result Date: 03/26/2023 CLINICAL DATA:  Fever. EXAM: PORTABLE CHEST 1 VIEW COMPARISON:  March 25, 2023. FINDINGS: Stable cardiomediastinal silhouette. Endotracheal and nasogastric tubes are unchanged in position. Left internal jugular catheter is unchanged. Stable moderate size right pleural effusion with associated right upper lobe atelectasis or infiltrate. Stable patchy opacities are noted in the left lung. Bony thorax is unremarkable. IMPRESSION: Stable support apparatus. Stable moderate right pleural effusion with associated right upper lobe atelectasis or infiltrate. Stable left lung opacities. Electronically Signed   By:  Lupita Raider M.D.   On: 03/26/2023 15:55   ECHOCARDIOGRAM COMPLETE  Result Date: 03/25/2023    ECHOCARDIOGRAM REPORT   Patient Name:   Gary Conway Date of Exam: 03/25/2023 Medical Rec #:  [id number]        Height:       68.0 in Accession #:    [id number]       Weight:       190.3 lb Date of Birth:  [birth year]        BSA:          2.001 m Patient Age:    47 years         BP:           115/84 mmHg Patient Gender: M                HR:           101 bpm. Exam Location:  Inpatient Procedure: 2D Echo, Cardiac Doppler and Color Doppler Indications:    Shock R57.9  History:        Patient has no prior history of Echocardiogram examinations.                 Risk Factors:Diabetes.  Sonographer:    Dondra Prader RVT RCS Referring Phys: [id number] Lorin Glass  Sonographer Comments: Technically challenging study due to limited acoustic windows, Technically  difficult study due to poor echo windows, suboptimal parasternal window, suboptimal apical window and suboptimal subcostal window. Technically challenging exam due to patients position and respiratory motion. IMPRESSIONS  1. Technically difficult study with poor visualization of cardiac structures.  2. Left ventricular ejection fraction, by estimation, is 50 to 55%. The left ventricle has low normal function. The left ventricle has no regional wall motion abnormalities. Left ventricular diastolic function could not be evaluated.  3. Right ventricular systolic function is normal. The right ventricular size is normal.  4. The mitral valve is normal in structure. No evidence of mitral valve regurgitation. No evidence of mitral stenosis.  5. The aortic valve is normal in structure. There is mild calcification of the aortic valve. Aortic valve regurgitation is not visualized. No aortic stenosis is present.  6. The inferior vena cava is normal in size with greater than 50% respiratory variability, suggesting right atrial pressure of 3 mmHg. FINDINGS  Left Ventricle: Left ventricular ejection fraction, by estimation, is 50 to 55%. The left ventricle has low normal function. The left ventricle has no regional wall motion abnormalities. The left ventricular internal cavity size was normal in size. There is no left ventricular hypertrophy. Left ventricular diastolic function could not be evaluated due to indeterminate diastolic function. Left ventricular diastolic function could not be evaluated. Right Ventricle: The right ventricular size is normal. No increase in right ventricular wall thickness. Right ventricular systolic function is normal. Left Atrium: Left atrial size was normal in size. Right Atrium: Right atrial size was normal in size. Pericardium: There is no evidence of pericardial effusion. Mitral Valve: The mitral valve is normal in structure. No evidence of mitral valve regurgitation. No evidence of mitral valve  stenosis. Tricuspid Valve: The tricuspid valve is normal in structure. Tricuspid valve regurgitation is not demonstrated. No evidence of tricuspid stenosis. Aortic Valve: The aortic valve is normal in structure. There is mild calcification of the aortic valve. Aortic valve regurgitation is not visualized. No aortic stenosis is present. Aortic valve mean gradient measures 2.0 mmHg. Aortic valve peak gradient measures 3.6 mmHg. Aortic  valve area, by VTI measures 1.78 cm. Pulmonic Valve: The pulmonic valve was normal in structure. Pulmonic valve regurgitation is not visualized. No evidence of pulmonic stenosis. Aorta: The aortic root is normal in size and structure. Venous: The inferior vena cava is normal in size with greater than 50% respiratory variability, suggesting right atrial pressure of 3 mmHg. IAS/Shunts: No atrial level shunt detected by color flow Doppler.  LEFT VENTRICLE PLAX 2D LVIDd:         5.00 cm LVIDs:         3.80 cm LV PW:         0.90 cm LV IVS:        0.60 cm LVOT diam:     1.60 cm LV SV:         27 LV SV Index:   13 LVOT Area:     2.01 cm  IVC IVC diam: 1.40 cm LEFT ATRIUM           Index        RIGHT ATRIUM          Index LA diam:      3.10 cm 1.55 cm/m   RA Area:     8.17 cm LA Vol (A4C): 22.7 ml 11.34 ml/m  RA Volume:   15.30 ml 7.65 ml/m  AORTIC VALVE                    PULMONIC VALVE AV Area (Vmax):    1.85 cm     PV Vmax:       0.78 m/s AV Area (Vmean):   1.73 cm     PV Peak grad:  2.4 mmHg AV Area (VTI):     1.78 cm AV Vmax:           94.30 cm/s AV Vmean:          68.700 cm/s AV VTI:            0.151 m AV Peak Grad:      3.6 mmHg AV Mean Grad:      2.0 mmHg LVOT Vmax:         86.60 cm/s LVOT Vmean:        59.000 cm/s LVOT VTI:          0.134 m LVOT/AV VTI ratio: 0.89  AORTA Ao Root diam: 3.40 cm Ao Asc diam:  3.10 cm MITRAL VALVE MV Area (PHT): 5.62 cm    SHUNTS MV Decel Time: 135 msec    Systemic VTI:  0.13 m MV E velocity: 79.77 cm/s  Systemic Diam: 1.60 cm MV A velocity:  57.00 cm/s MV E/A ratio:  1.40 Aditya Sabharwal Electronically signed by Dorthula Nettles Signature Date/Time: 03/25/2023/2:58:57 PM    Final    VAS [place] LOWER EXTREMITY VENOUS (DVT)  Result Date: 03/25/2023  Lower Venous DVT Study Patient Name:  Gary Conway  Date of Exam:   03/25/2023 Medical Rec #: [id number]         Accession #:    [id number] Date of Birth: 1975/12/13         Patient Gender: M Patient Age:   28 years Exam Location:  [facility] Procedure:      VAS [place] LOWER EXTREMITY VENOUS (DVT) Referring Phys: Levon Hedger --------------------------------------------------------------------------------  Indications: Edema.  Risk Factors: None identified. Limitations: Poor ultrasound/tissue interface and patient positioning. Comparison Study: No prior studies. Performing Technologist: Chanda Busing RVT  Examination Guidelines: A complete evaluation includes B-mode imaging,  spectral Doppler, color Doppler, and power Doppler as needed of all accessible portions of each vessel. Bilateral testing is considered an integral part of a complete examination. Limited examinations for reoccurring indications may be performed as noted. The reflux portion of the exam is performed with the patient in reverse Trendelenburg.  +---------+---------------+---------+-----------+----------+--------------+ RIGHT    CompressibilityPhasicitySpontaneityPropertiesThrombus Aging +---------+---------------+---------+-----------+----------+--------------+ CFV      Full           Yes      Yes                                 +---------+---------------+---------+-----------+----------+--------------+ SFJ      Full                                                        +---------+---------------+---------+-----------+----------+--------------+ FV Prox  Full                                                        +---------+---------------+---------+-----------+----------+--------------+ FV Mid   Full                                                         +---------+---------------+---------+-----------+----------+--------------+ FV DistalFull                                                        +---------+---------------+---------+-----------+----------+--------------+ PFV      Full                                                        +---------+---------------+---------+-----------+----------+--------------+ POP      Full           Yes      Yes                                 +---------+---------------+---------+-----------+----------+--------------+ PTV      Full                                                        +---------+---------------+---------+-----------+----------+--------------+ PERO     Full                                                        +---------+---------------+---------+-----------+----------+--------------+   +---------+---------------+---------+-----------+----------+--------------+  LEFT     CompressibilityPhasicitySpontaneityPropertiesThrombus Aging +---------+---------------+---------+-----------+----------+--------------+ CFV      Full           Yes      Yes                                 +---------+---------------+---------+-----------+----------+--------------+ SFJ      Full                                                        +---------+---------------+---------+-----------+----------+--------------+ FV Prox  Full                                                        +---------+---------------+---------+-----------+----------+--------------+ FV Mid   Full                                                        +---------+---------------+---------+-----------+----------+--------------+ FV DistalFull                                                        +---------+---------------+---------+-----------+----------+--------------+ PFV      Full                                                         +---------+---------------+---------+-----------+----------+--------------+ POP      Full           Yes      Yes                                 +---------+---------------+---------+-----------+----------+--------------+ PTV      Full                                                        +---------+---------------+---------+-----------+----------+--------------+ PERO     Full                                                        +---------+---------------+---------+-----------+----------+--------------+     Summary: RIGHT: - There is no evidence of deep vein thrombosis in the lower extremity.  - No cystic structure found in the popliteal fossa.  LEFT: - There is no evidence of deep vein thrombosis in the lower extremity.  - No  cystic structure found in the popliteal fossa.  *See table(s) above for measurements and observations. Electronically signed by Sherald Hess MD on 03/25/2023 at 2:11:03 PM.    Final    [place] CHEST (PLEURAL EFFUSION)  Result Date: 03/25/2023 INDICATION: 47 year old male currently admitted due to shortness of breath, pneumonia, and sepsis. Previous chest x-ray showed right pleural effusion. Request for therapeutic and diagnostic thoracentesis. EXAM: CHEST ULTRASOUND COMPARISON:  None Available. FINDINGS: Bilateral pleural space visualized with ultrasound which revealed no pleural effusion. Thoracentesis was not performed. IMPRESSION: No pleural effusion.  Thoracentesis was not performed. Performed by: Lawernce Ion, PA-C Electronically Signed   By: Marin Roberts M.D.   On: 03/25/2023 12:18   DG Chest Port 1 View  Result Date: 03/25/2023 CLINICAL DATA:  Fever.  Endotracheal tube. EXAM: PORTABLE CHEST 1 VIEW COMPARISON:  March 24, 2023. FINDINGS: Stable cardiomediastinal silhouette. Endotracheal and nasogastric tubes are in grossly good position. Left internal jugular catheter is noted with distal tip in expected position of cavoatrial junction.  No pneumothorax is noted. Moderate right pleural effusion is noted with associated right-sided pneumonia or atelectasis. Stable left lung opacities are noted as well. IMPRESSION: Stable bilateral lung opacities are noted with associated moderate right pleural effusion. Endotracheal and nasogastric tubes are in grossly good position. Left internal jugular catheter is noted with distal tip in expected position of cavoatrial junction. Electronically Signed   By: Lupita Raider M.D.   On: 03/25/2023 09:07   US RENAL  Result Date: 03/24/2023 CLINICAL DATA:  [id number] Acute kidney failure (HCC) [id number] EXAM: RENAL / URINARY TRACT ULTRASOUND COMPLETE COMPARISON:  March 23, 2014 FINDINGS: Right Kidney: Renal measurements: 11.8 x 5.4 x 5.0 cm = volume: 167 mL. Echogenicity within normal limits. No mass or hydronephrosis visualized. Left Kidney: Renal measurements: 11.8 x 5.5 x 6.0 cm = volume: 204 mL. Echogenicity within normal limits. No mass or hydronephrosis visualized. Bladder: Nearly completely decompressed. There is a focus of rounded high density noted within the bladder which may reflect a Foley balloon, suboptimally visualized. Other: None. IMPRESSION: 1. No hydronephrosis. 2. Likely Foley balloon within the decompressed bladder, suboptimally visualized. Recommend correlation with history of Foley placement. Electronically Signed   By: Meda Klinefelter M.D.   On: 03/24/2023 19:33   DG Abd 1 View  Result Date: 03/24/2023 CLINICAL DATA:  Enteric tube placement. EXAM: ABDOMEN - 1 VIEW COMPARISON:  CT abdomen pelvis dated 03/23/2014. FINDINGS: An enteric tube terminates in the stomach. Multiple distended loops of bowel are seen throughout the abdomen. IMPRESSION: Enteric tube terminates in the stomach. Electronically Signed   By: Romona Curls M.D.   On: 03/24/2023 14:52   DG CHEST PORT 1 VIEW  Result Date: 03/24/2023 CLINICAL DATA:  10027 Tachypnea [id number] [id number] Dyspnea [id number] EXAM: PORTABLE CHEST 1 VIEW  COMPARISON:  March 22, 2023 FINDINGS: The cardiomediastinal silhouette is unchanged in contour. Small RIGHT pleural effusion. No pneumothorax. Increased LEFT-sided airspace opacities compared to prior. Similar to mildly increased RIGHT-sided heterogeneous opacities. IMPRESSION: 1. Increased LEFT-sided airspace opacities compared to prior. Similar to mildly increased RIGHT-sided heterogeneous opacities. Findings are concerning for multifocal pneumonia. 2. Small RIGHT pleural effusion. Electronically Signed   By: Meda Klinefelter M.D.   On: 03/24/2023 12:05   DG Chest 2 View  Result Date: 03/22/2023 CLINICAL DATA:  Cough.  Fever. EXAM: CHEST - 2 VIEW COMPARISON:  None Available. FINDINGS: Low lung volume. There are patchy heterogeneous opacities throughout bilateral lungs with relative sparing of upper lobes, compatible with  multilobar pneumonia versus ARDS. There is small-to-moderate right pleural effusion with probable associated right lower lobe collapse. No left pleural effusion. Normal cardio-mediastinal silhouette. No acute osseous abnormalities. The soft tissues are within normal limits. IMPRESSION: *There are patchy heterogeneous opacities throughout bilateral lungs, compatible with multilobar pneumonia versus ARDS. *There is small-to-moderate right pleural effusion with probable associated right lower lobe collapse. No left pleural effusion. Follow-up to clearing is recommended. Electronically Signed   By: Jules Schick M.D.   On: 03/22/2023 12:45    Labs:  CBC: Recent Labs    03/27/23 0433 03/28/23 0233 03/28/23 0404 03/28/23 1008 03/29/23 0331  WBC 32.6* 64.1* 58.0*  --  47.8*  HGB 8.6* 9.2* 8.8* 9.9* 9.3*  HCT 26.4* 28.0* 26.4* 29.0* 27.9*  PLT 269 347 310  --  288    COAGS: Recent Labs    03/23/23 0656  INR 1.3*    BMP: Recent Labs    03/28/23 0233 03/28/23 1000 03/28/23 1008 03/28/23 1547 03/29/23 0331  NA 135 136 137 134* 136  K 2.7* 3.3* 3.4* 3.5 4.2  CL 101  105  --  103 106  CO2 15* 16*  --  17* 16*  GLUCOSE 242* 247*  --  146* 181*  BUN 141* 130*  --  131* 128*  CALCIUM 7.3* 7.4*  --  7.7* 7.6*  CREATININE 5.04* 4.51*  --  4.52* 4.35*  GFRNONAA 13* 15*  --  15* 16*    LIVER FUNCTION TESTS: Recent Labs    03/22/23 1124 03/27/23 1735 03/28/23 1000 03/28/23 1547 03/29/23 0331  BILITOT 1.8*  --   --   --   --   AST 62*  --   --   --   --   ALT 33  --   --   --   --   ALKPHOS 85  --   --   --   --   PROT 7.7  --   --   --   --   ALBUMIN 2.4* 1.6* 1.7* 1.8* 1.8*    TUMOR MARKERS: No results for input(s): "AFPTM", "CEA", "CA199", "CHROMGRNA" in the last 8760 hours.  Assessment and Plan:  Scheduled for random renal biopsy --possibly 11/4 in IR Leukocytosis--- on Prednisone Afeb We will recheck pt 11/4 am--- planned for possible biopsy Risks and benefits of random renal biopsy was discussed with the patient and/or patient's family including, but not limited to bleeding, infection, damage to adjacent structures or low yield requiring additional tests.  All of the questions were answered and there is agreement to proceed.  Consent signed and in chart.   Thank you for this interesting consult.  I greatly enjoyed meeting Therman Hughlett and look forward to participating in their care.  A copy of this report was sent to the requesting provider on this date.  Electronically Signed: Robet Leu, PA-C 03/29/2023, 12:42 PM   I spent a total of 20 Minutes    in face to face in clinical consultation, greater than 50% of which was counseling/coordinating care for random renal biopsy

## 2023-03-29 NOTE — Progress Notes (Signed)
Transfer Note:  Pt arrived from 110M to 5W, at approximately 1715. Pt is alert and oriented x4, on 10LHFNC, and denied pain. Fall precautions in place, and pt in NAD at this time.

## 2023-03-30 LAB — PROCALCITONIN: Procalcitonin: 2.8 ng/mL

## 2023-03-30 LAB — GLUCOSE, CAPILLARY
Glucose-Capillary: 244 mg/dL — ABNORMAL HIGH (ref 70–99)
Glucose-Capillary: 198 mg/dL — ABNORMAL HIGH (ref 70–99)
Glucose-Capillary: 202 mg/dL — ABNORMAL HIGH (ref 70–99)
Glucose-Capillary: 153 mg/dL — ABNORMAL HIGH (ref 70–99)
Glucose-Capillary: 200 mg/dL — ABNORMAL HIGH (ref 70–99)

## 2023-03-30 LAB — CBC
HCT: 29.9 % — ABNORMAL LOW (ref 39.0–52.0)
Hemoglobin: 9.9 g/dL — ABNORMAL LOW (ref 13.0–17.0)
MCH: 21.9 pg — ABNORMAL LOW (ref 26.0–34.0)
MCHC: 33.1 g/dL (ref 30.0–36.0)
MCV: 66.2 fL — ABNORMAL LOW (ref 80.0–100.0)
Platelets: 260 10*3/uL (ref 150–400)
RBC: 4.52 MIL/uL (ref 4.22–5.81)
RDW: 16.4 % — ABNORMAL HIGH (ref 11.5–15.5)
WBC: 43.9 10*3/uL — ABNORMAL HIGH (ref 4.0–10.5)
nRBC: 0.4 % — ABNORMAL HIGH (ref 0.0–0.2)

## 2023-03-30 LAB — TECHNOLOGIST SMEAR REVIEW

## 2023-03-30 LAB — BASIC METABOLIC PANEL
Anion gap: 14 (ref 5–15)
BUN: 119 mg/dL — ABNORMAL HIGH (ref 6–20)
CO2: 18 mmol/L — ABNORMAL LOW (ref 22–32)
Calcium: 7.6 mg/dL — ABNORMAL LOW (ref 8.9–10.3)
Chloride: 104 mmol/L (ref 98–111)
Creatinine, Ser: 4.13 mg/dL — ABNORMAL HIGH (ref 0.61–1.24)
GFR, Estimated: 17 mL/min — ABNORMAL LOW (ref >60)
Glucose, Bld: 177 mg/dL — ABNORMAL HIGH (ref 70–99)
Potassium: 4.3 mmol/L (ref 3.5–5.1)
Sodium: 136 mmol/L (ref 135–145)

## 2023-03-30 LAB — C-REACTIVE PROTEIN: CRP: 6.4 mg/dL — ABNORMAL HIGH (ref <1.0)

## 2023-03-30 LAB — BRAIN NATRIURETIC PEPTIDE: B Natriuretic Peptide: 58.7 pg/mL (ref 0.0–100.0)

## 2023-03-30 LAB — MAGNESIUM: Magnesium: 2.2 mg/dL (ref 1.7–2.4)

## 2023-03-30 LAB — PHOSPHORUS: Phosphorus: 7.3 mg/dL — ABNORMAL HIGH (ref 2.5–4.6)

## 2023-03-30 MED ORDER — REVEFENACIN 175 MCG/3ML IN SOLN
175.0000 ug | Freq: Every day | RESPIRATORY_TRACT | Status: DC
  Administered 2023-03-30 – 2023-04-08 (×10): 175 ug via RESPIRATORY_TRACT
  Filled 2023-03-30 (×10): qty 3

## 2023-03-30 NOTE — Plan of Care (Signed)
°  Problem: Fluid Volume: °Goal: Hemodynamic stability will improve °Outcome: Progressing °  °Problem: Clinical Measurements: °Goal: Diagnostic test results will improve °Outcome: Progressing °Goal: Signs and symptoms of infection will decrease °Outcome: Progressing °  °

## 2023-03-30 NOTE — Progress Notes (Signed)
   NAME:  Gary [place], MRN:  [id number], DOB:  [birth year], LOS: 8 ADMISSION DATE:  03/22/2023, CONSULTATION DATE:  03/24/2023 REFERRING MD:  Caleb Popp, CHIEF COMPLAINT:  Acute hypoxemic respiratory failure   History of Present Illness:  47 year old male with PMH of T2DM, spinal cord angioma s/p resection 2015, and obesity who presented to the ED on 10/25 with 1 week of fevers and malaise and was admitted for DKA and severe AKI.  Presented febrile, tachycardic, hypertensive, tachypneic, and hypoxic responsive to 5 L nasal cannula with labs showing hyponatremia, anion gap metabolic acidosis with a gap of 24, creatinine 5.65 from a baseline of 1.1 (2021), leukocytosis of 24, hemoglobin 10.9, lactic acid 4.4, Pro-Cal 141.  He was found to have bilateral opacities chest x-ray and small right pleural effusion.  Treated for DKA and community-acquired pneumonia.  On 10/27 respiratory status worsened and patient was intubated and transferred to the ICU.  Pertinent  Medical History  T2DM Spinal Hemangioma s/p excision 2015  Significant Hospital Events: Including procedures, antibiotic start and stop dates in addition to other pertinent events   10/25: Admitted with DKA, AKI, bilateral pneumonia w/ possible developing ARDS 10/27: Intubated for worsening respiratory status, bronchoscopy w/ alveolitis, POCUS TTE w/ normal RV function, renal ultrasound WNL.  Antibiotics broadened and pulsed dose steroids added 10/28: passing SBTs 10/30: extubated  Interim History / Subjective:  Doing well When I saw him this morning his oxygen was off his face, was saturating about 88 to 89%  Objective   Blood pressure (!) 135/94, pulse 100, temperature 98 F (36.7 C), temperature source Oral, resp. rate 20, height 5\' 8"  (1.727 m), weight 84.2 kg, SpO2 92%.        Intake/Output Summary (Last 24 hours) at 03/30/2023 1429 Last data filed at 03/30/2023 1239 Gross per 24 hour  Intake 3 ml  Output 2600 ml  Net -2597 ml    Filed Weights   03/22/23 2056 03/26/23 0701 03/30/23 0459  Weight: 86.3 kg 89 kg 84.2 kg    Examination: Constitutional: Middle-age, does not appear to be in distress HENT: Moist oral mucosa Eyes: Anicteric Cardio: S1-S2 appreciated  pulm: Decreased air movement bilaterally Abdomen: Soft, bowel sounds appreciated WUJ:[id number] for extremity edema. Skin:Warm and dry. Neuro:Alert and follows commands. No focal deficit noted.  Resolved Hospital Problem list   DKA Severe sepsis  Assessment & Plan:   Acute hypoxemic respiratory failure ARDS Diffuse alveolar hemorrhage Concern for pulmonary renal syndrome Plan for renal biopsy 11/4 Sepsis -Resolved Type 2 diabetes -Continue insulin coverage Hypertension  Oxygen requirement is improving  CT scan can be considered if any worsening status however at present appears to be trending in the right direction Has completed course of antibiotics Leukocytosis likely related to steroids  Continue to wean oxygen supplementation  Pulmonary will follow   Virl Diamond, MD [place] [facility] Pager: See Loretha Stapler

## 2023-03-30 NOTE — Progress Notes (Signed)
PROGRESS NOTE    Gary [place]  ZOX:[id number] DOB: 01/07/1976 DOA: 03/22/2023 PCP: Pcp, No     Brief Narrative:  47 year old with history of DM 2, spinal cord angioma s/p resection 2015, obesity presented to the ED on 10/25 with 1 week of fever and malaise prior to admission.  Patient was found to have DKA and AKI with signs of sepsis and significant other metabolic abnormalities.  Procalcitonin was ordered 41 and chest x-ray showing significant infiltrate bilaterally.  Initially due to concerns of ARDS development, he was intubated on 10/27 and eventually extubated on 10/30.  In the meantime patient received antibiotics, nephrology team has been following.  There has been concerns of pulmonary renal syndrome therefore currently on steroids.    Subjective:  Patient in bed, appears comfortable, denies any headache, no fever, no chest pain or pressure, no shortness of breath , no abdominal pain. No new focal weakness.    Assessment & Plan:     Acute respiratory failure secondary to ARDS/DAH Severe sepsis secondary to multifocal community-acquired pneumonia -Initially intubated on 10/27 and extubated 10/30.  Bronchoscopy status post BAL (10/27).  Patient still remains on 13 L high flow.  Will slowly wean off as appropriate.  Currently patient is on steroids, IV Zosyn, last day 11/2.  Will trend procalcitonin, periodically check BNP.  Chest x-ray shows persistent right-sided opacity, will defer need for CT scan/thoracentesis to pulmonary team. -Bronchodilators, I-S/flutter valve.   Acute kidney injury on CKD stage V Uremia -Creatinine 2021 was 1.1.  Admission creatinine 5.7 > 4.35.  Renal ultrasound is negative.  Nephro considering renal biopsy by IR on 04/01/2023, INR checked type screen done.  Diuretics/fluid therapy per nephro.  Will defer need of sodium bicarb to nephrology.   Essential hypertension -Norvasc, Coreg.  IV as needed   Microcytic anemia -Low iron saturations.  Will  place him on supplements.   Severe leukocytosis.  Check peripheral smear, likely due to #1 above from pneumonia, no diarrhea, continue to monitor CBC along with inflammatory markers.    Diabetic ketoacidosis History of diabetes mellitus type 2 - DKA is now resolved.  Sugars are difficult to control as patient is still on steroids.  Will adjust insulin as necessary.  Lab Results  Component Value Date   HGBA1C 8.1 (H) 03/22/2023   CBG (last 3)  Recent Labs    03/29/23 1739 03/29/23 1925 03/30/23 0857  GLUCAP 67* 141* 244*     DVT prophylaxis: heparin injection 5,000 Units Start: 03/25/23 0600 Code Status: Full code Family Communication:  None Status is: Inpatient Remains inpatient appropriate because: Continue hospital stay as patient still significantly hypoxic.      Examination:   Awake Alert, No new F.N deficits, Normal affect [place].AT,PERRAL Supple Neck, No JVD,   Symmetrical Chest wall movement, Good air movement bilaterally, CTAB RRR,No Gallops, Rubs or new Murmurs,  +ve B.Sounds, Abd Soft, No tenderness,   No Cyanosis, Clubbing or edema     Diet Orders (From admission, onward)     Start     Ordered   04/01/23 0001  Diet NPO time specified Except for: Sips with Meds  Diet effective midnight       Question:  Except for  Answer:  Sips with Meds   03/29/23 1251   03/27/23 1610  Diet Carb Modified Fluid consistency: Thin; Room service appropriate? Yes with Assist  Diet effective now       Question Answer Comment  Diet-HS Snack? Nothing   Calorie Level  Medium 1600-2000   Fluid consistency: Thin   Room service appropriate? Yes with Assist      03/27/23 1610            Objective: Vitals:   03/30/23 0459 03/30/23 0617 03/30/23 0823 03/30/23 0858  BP:  (!) 128/90  (!) 133/97  Pulse:  94  98  Resp:  (!) 22  (!) 25  Temp:  97.8 F (36.6 C)  97.9 F (36.6 C)  TempSrc:  Oral  Oral  SpO2:  97% 97% 93%  Weight: 84.2 kg     Height:        Intake/Output  Summary (Last 24 hours) at 03/30/2023 1036 Last data filed at 03/30/2023 0500 Gross per 24 hour  Intake 25.03 ml  Output 1800 ml  Net -1774.97 ml   Filed Weights   03/22/23 2056 03/26/23 0701 03/30/23 0459  Weight: 86.3 kg 89 kg 84.2 kg    Scheduled Meds:  amLODipine  5 mg Oral Daily   arformoterol  15 mcg Nebulization BID   carvedilol  6.25 mg Oral BID WC   Chlorhexidine Gluconate Cloth  6 each Topical Daily   famotidine  10 mg Oral Daily   ferrous sulfate  325 mg Oral Q breakfast   heparin  5,000 Units Subcutaneous Q8H   insulin aspart  0-20 Units Subcutaneous TID WC   insulin aspart  0-5 Units Subcutaneous QHS   insulin aspart  10 Units Subcutaneous TID WC   insulin glargine-yfgn  20 Units Subcutaneous BID   predniSONE  40 mg Oral Q lunch   revefenacin  175 mcg Nebulization Daily   sodium chloride flush  3 mL Intravenous Q12H   Continuous Infusions:  furosemide 120 mg (03/30/23 0912)   piperacillin-tazobactam 3.375 g (03/30/23 0501)    Nutritional status Signs/Symptoms: NPO status Interventions: Refer to RD note for recommendations Body mass index is 28.22 kg/m.  Data Reviewed:   Recent Labs  Lab 03/25/23 0437 03/26/23 0335 03/27/23 2536 03/28/23 0233 03/28/23 0404 03/28/23 1008 03/29/23 0331 03/30/23 0442  WBC 19.8*  19.2*   < > 32.6* 64.1* 58.0*  --  47.8* 43.9*  HGB 8.6*  8.7*   < > 8.6* 9.2* 8.8* 9.9* 9.3* 9.9*  HCT 27.1*  27.0*   < > 26.4* 28.0* 26.4* 29.0* 27.9* 29.9*  PLT PLATELET CLUMPS NOTED ON SMEAR, UNABLE TO ESTIMATE  252   < > 269 347 310  --  288 260  MCV 67.9*  67.5*   < > 66.5* 66.2* 66.0*  --  65.0* 66.2*  MCH 21.6*  21.8*   < > 21.7* 21.7* 22.0*  --  21.7* 21.9*  MCHC 31.7  32.2   < > 32.6 32.9 33.3  --  33.3 33.1  RDW 16.3*  16.0*   < > 16.2* 16.7* 16.3*  --  15.9* 16.4*  LYMPHSABS 0.8  --   --   --  4.1*  --   --   --   MONOABS 0.2  --   --   --  2.3*  --   --   --   EOSABS 0.0  --   --   --  0.0  --   --   --   BASOSABS 0.0   --   --   --  0.0  --   --   --    < > = values in this interval not displayed.    Recent Labs  Lab  0000 03/23/23  1126 03/23/23 1513 03/24/23 0352 03/24/23 1516 03/24/23 1830 03/25/23 0144 03/25/23 0437 03/26/23 0335 03/26/23 1702 03/27/23 0433 03/27/23 1735 03/28/23 0233 03/28/23 1000 03/28/23 1008 03/28/23 1547 03/29/23 0331 03/29/23 0752 03/30/23 0442 03/30/23 0748  NA  --   --   --  129*   < >  --  130*  --  134*  --  143 143 135 136 137 134* 136  --  136  --   K  --   --   --  3.8   < >  --  4.2  --  3.4*  --  3.2* 3.3* 2.7* 3.3* 3.4* 3.5 4.2  --  4.3  --   CL   < >  --   --  97*  --   --  95*  --  100  --  109 110 101 105  --  103 106  --  104  --   CO2   < >  --   --  16*  --   --  18*  --  16*  --  18* 17* 15* 16*  --  17* 16*  --  18*  --   ANIONGAP   < >  --   --  16*  --   --  17*  --  18*  --  16* 16* 19* 15  --  14 14  --  14  --   GLUCOSE   < >  --   --  193*  --   --  332*  --  319*  --  160* 209* 242* 247*  --  146* 181*  --  177*  --   BUN   < >  --   --  74*  --   --  91*  --  110*  --  138* 140* 141* 130*  --  131* 128*  --  119*  --   CREATININE   < >  --   --  4.83*  --   --  4.72*   < > 4.86*  --  5.06* 4.72* 5.04* 4.51*  --  4.52* 4.35*  --  4.13*  --   ALBUMIN  --   --   --   --   --   --   --   --   --   --   --  1.6*  --  1.7*  --  1.8* 1.8*  --   --   --   CRP  --   --   --   --   --  30.0*  --   --   --   --   --   --   --   --   --   --   --   --   --  6.4*  DDIMER  --   --  11.33*  --   --   --   --   --   --   --   --   --   --   --   --   --   --   --   --   --   PROCALCITON  --   --   --  82.45  --   --  58.82  --   --   --   --   --   --   --   --   --   --  5.71  --   --  LATICACIDVEN  --  1.7  --   --   --   --   --   --   --   --   --   --   --   --   --   --   --   --   --   --   BNP  --   --   --  456.3*  --   --   --   --   --   --   --   --   --   --   --   --   --   --  58.7  --   MG  --   --   --   --   --   --   --    < > 3.1* 3.0*  2.9*  --   --  2.4  --   --   --   --  2.2  --   CALCIUM   < >  --   --  7.7*  --   --  7.3*  --  7.8*  --  7.7* 7.6* 7.3* 7.4*  --  7.7* 7.6*  --  7.6*  --    < > = values in this interval not displayed.    Lab Results  Component Value Date   TRIG 229 (H) 03/25/2023      Recent Labs  Lab 03/23/23 1126 03/23/23 1513 03/24/23 0352 03/24/23 0352 03/24/23 1830 03/25/23 0144 03/25/23 1738 03/26/23 0335 03/26/23 1702 03/27/23 0433 03/27/23 1735 03/28/23 0233 03/28/23 1000 03/28/23 1547 03/29/23 0331 03/29/23 0752 03/30/23 0442 03/30/23 0748  CRP  --   --   --   --  30.0*  --   --   --   --   --   --   --   --   --   --   --   --  6.4*  DDIMER  --  11.33*  --   --   --   --   --   --   --   --   --   --   --   --   --   --   --   --   PROCALCITON  --   --  82.45  --   --  58.82  --   --   --   --   --   --   --   --   --  5.71  --   --   LATICACIDVEN 1.7  --   --   --   --   --   --   --   --   --   --   --   --   --   --   --   --   --   BNP  --   --  456.3*  --   --   --   --   --   --   --   --   --   --   --   --   --  58.7  --   MG  --   --   --   --   --   --    < > 3.1* 3.0* 2.9*  --   --  2.4  --   --   --  2.2  --   CALCIUM  --   --  7.7*   < >  --  7.3*  --  7.8*  --  7.7*   < > 7.3* 7.4* 7.7* 7.6*  --  7.6*  --    < > = values in this interval not displayed.    Recent Results (from the past 240 hour(s))  Culture, blood (routine x 2)     Status: None   Collection Time: 03/22/23 11:43 AM   Specimen: BLOOD  Result Value Ref Range Status   Specimen Description BLOOD BLOOD LEFT FOREARM  Final   Special Requests   Final    BOTTLES DRAWN AEROBIC AND ANAEROBIC Blood Culture adequate volume   Culture   Final    NO GROWTH 5 DAYS Performed at [facility], [address] [place], [place] [address]    Report Status 03/27/2023 FINAL  Final  Resp panel by RT-PCR (RSV, Flu A&B, Covid) Anterior Nasal Swab     Status: None   Collection Time: 03/22/23 11:45 AM    Specimen: Anterior Nasal Swab  Result Value Ref Range Status   SARS Coronavirus 2 by RT PCR NEGATIVE NEGATIVE Final   Influenza A by PCR NEGATIVE NEGATIVE Final   Influenza B by PCR NEGATIVE NEGATIVE Final    Comment: (NOTE) The Xpert Xpress SARS-CoV-2/FLU/RSV plus assay is intended as an aid in the diagnosis of influenza from Nasopharyngeal swab specimens and should not be used as a sole basis for treatment. Nasal washings and aspirates are unacceptable for Xpert Xpress SARS-CoV-2/FLU/RSV testing.  Fact Sheet for Patients: BloggerCourse.com  Fact Sheet for Healthcare Providers: SeriousBroker.it  This test is not yet approved or cleared by the [place] FDA and has been authorized for detection and/or diagnosis of SARS-CoV-2 by FDA under an Emergency Use Authorization (EUA). This EUA will remain in effect (meaning this test can be used) for the duration of the COVID-19 declaration under Section 564(b)(1) of the Act, 21 U.S.C. section 360bbb-3(b)(1), unless the authorization is terminated or revoked.     Resp Syncytial Virus by PCR NEGATIVE NEGATIVE Final    Comment: (NOTE) Fact Sheet for Patients: BloggerCourse.com  Fact Sheet for Healthcare Providers: SeriousBroker.it  This test is not yet approved or cleared by the [place] FDA and has been authorized for detection and/or diagnosis of SARS-CoV-2 by FDA under an Emergency Use Authorization (EUA). This EUA will remain in effect (meaning this test can be used) for the duration of the COVID-19 declaration under Section 564(b)(1) of the Act, 21 U.S.C. section 360bbb-3(b)(1), unless the authorization is terminated or revoked.  Performed at [facility], [address], [place], [place] [address]   Culture, blood (routine x 2)     Status: None   Collection Time: 03/22/23  1:25 PM   Specimen: BLOOD  Result Value  Ref Range Status   Specimen Description BLOOD RIGHT ANTECUBITAL  Final   Special Requests   Final    BOTTLES DRAWN AEROBIC AND ANAEROBIC Blood Culture results may not be optimal due to an excessive volume of blood received in culture bottles   Culture   Final    NO GROWTH 5 DAYS Performed at [facility], [address], [place], [place] [address]    Report Status 03/27/2023 FINAL  Final  MRSA Next Gen by PCR, Nasal     Status: None   Collection Time: 03/24/23 12:11 PM   Specimen: Nasal Mucosa; Nasal Swab  Result Value Ref Range Status   MRSA by PCR Next  Gen NOT DETECTED NOT DETECTED Final    Comment: (NOTE) The GeneXpert MRSA Assay (FDA approved for NASAL specimens only), is one component of a comprehensive MRSA colonization surveillance program. It is not intended to diagnose MRSA infection nor to guide or monitor treatment for MRSA infections. Test performance is not FDA approved in patients less than 31 years old. Performed at [facility], [address], [place], [place] [address]   Culture, Respiratory w Gram Stain     Status: None   Collection Time: 03/24/23 12:43 PM   Specimen: Bronchoalveolar Lavage; Respiratory  Result Value Ref Range Status   Specimen Description BRONCHIAL ALVEOLAR LAVAGE  Final   Special Requests NONE  Final   Gram Stain   Final    RARE WBC SEEN RARE SQUAMOUS EPITHELIAL CELLS PRESENT NO ORGANISMS SEEN    Culture   Final    NO GROWTH 2 DAYS Performed at [facility], [address] [place], [place] [address]    Report Status 03/27/2023 FINAL  Final    Radiology Studies: DG CHEST PORT 1 VIEW  Result Date: 03/29/2023 CLINICAL DATA:  Shortness of breath. EXAM: PORTABLE CHEST 1 VIEW COMPARISON:  Chest radiographs 03/28/2023 and 03/26/2023 FINDINGS: Cardiac silhouette is again mildly enlarged. Mediastinal contours are grossly within normal limits for AP technique and low lung volumes. Interval removal of the prior left internal  jugular central venous catheter. Moderately decreased lung volumes. Elevation of the right hemidiaphragm is unchanged. Heterogeneous airspace opacification silhouetting the right hemidiaphragm is not significant changed. Additional heterogeneous airspace opacification within the left mid lung is not simply changed. It is difficult to exclude an underlying right pleural effusion, has on prior. No definite left pleural effusion is seen. No pneumothorax is seen. No acute skeletal abnormality. IMPRESSION: 1. Interval removal of the prior left internal jugular central venous catheter. 2. No significant change in heterogeneous airspace opacification within the right lower lung and left mid lung. Again this may represent multifocal pneumonia. 3. Unchanged elevation of the right hemidiaphragm. Electronically Signed   By: Neita Garnet M.D.   On: 03/29/2023 14:24     LOS: 8 days   Signature  -    Susa Raring M.D on 03/30/2023 at 10:36 AM   -  To page go to [email or web address]

## 2023-03-30 NOTE — Progress Notes (Signed)
Admit: 03/22/2023 LOS: 8  1M renal failure of unclear chronicity, AHRF with findings of alveolar hemorrhage on bronchoscopy.  Vasculitic workup negative.  Subjective:  Creatinine and BUN continue to slowly decline, K4.3, bicarbonate 18 1.8 L urine output yesterday Down to 10 L high flow nasal cannula, breathing appears more comfortable Blood pressures are stable, afebrile  11/01 0701 - 11/02 0700 In: 25 [I.V.:3; IV Piggyback:22] Out: 1800 [Urine:1800]  Filed Weights   03/22/23 2056 03/26/23 0701 03/30/23 0459  Weight: 86.3 kg 89 kg 84.2 kg    Scheduled Meds:  amLODipine  5 mg Oral Daily   arformoterol  15 mcg Nebulization BID   carvedilol  6.25 mg Oral BID WC   Chlorhexidine Gluconate Cloth  6 each Topical Daily   famotidine  10 mg Oral Daily   ferrous sulfate  325 mg Oral Q breakfast   heparin  5,000 Units Subcutaneous Q8H   insulin aspart  0-20 Units Subcutaneous TID WC   insulin aspart  0-5 Units Subcutaneous QHS   insulin aspart  10 Units Subcutaneous TID WC   insulin glargine-yfgn  20 Units Subcutaneous BID   predniSONE  40 mg Oral Q lunch   revefenacin  175 mcg Nebulization Daily   sodium chloride flush  3 mL Intravenous Q12H   Continuous Infusions:  furosemide 120 mg (03/30/23 0912)   piperacillin-tazobactam 3.375 g (03/30/23 0501)   PRN Meds:.acetaminophen **OR** acetaminophen, guaiFENesin, hydrALAZINE, ipratropium-albuterol, metoprolol tartrate, ondansetron (ZOFRAN) IV, mouth rinse, polyethylene glycol, senna-docusate, traZODone  Current Labs: reviewed   Latest Reference Range & Units 03/24/23 18:30  Anti Nuclear Antibody (ANA) Negative  Negative  ANA Ab, IFA  Negative  ASO 0.0 - 200.0 IU/mL 25.0  CCP Antibodies IgG/IgA 0 - 19 units 6  ds DNA Ab 0 - 9 IU/mL <1  GBM Ab 0.0 - 0.9 units <0.2  RA Latex Turbid. <14.0 IU/mL 23.9 (H)  Cytoplasmic (C-ANCA) Neg:<1:20 titer Neg:<1:20 titer <1:20 <1:20  P-ANCA Neg:<1:20 titer Neg:<1:20 titer <1:20 <1:20   Atypical P-ANCA titer Neg:<1:20 titer Neg:<1:20 titer <1:20 <1:20  Anti-MPO Antibodies 0.0 - 0.9 units 0.0 - 0.9 units <0.2 <0.2  Anti-PR3 Antibodies 0.0 - 0.9 units 0.0 - 0.9 units 0.6 0.6  C3 Complement 82 - 167 mg/dL 82 - 161 mg/dL 096 045  Complement C4, Body Fluid 12 - 38 mg/dL 36  (H): Data is abnormally high  Physical Exam:  Blood pressure (!) 133/97, pulse 98, temperature 97.9 F (36.6 C), temperature source Oral, resp. rate (!) 25, height 5\' 8"  (1.727 m), weight 84.2 kg, SpO2 93%. Conversant, mild increased work of breathing with tachypnea Regular, borderline tachycardia, normal S1 and S2 Coarse breath sounds bilaterally No significant edema Nonfocal, CN II through XII intact Soft, nontender  A Renal failure of unclear chronicity, nonoliguric Question of GN because of problem #2 Serologies negative Urine without strong suggestion of nephritic sediment Given ongoing uncertainty about pulmonary and renal status plan is for kidney biopsy when stable, tentatively for 11/4, IR following The paper kidney biopsy requisition form is in the paper chart Has been responsive to Lasix as needed  I think very unlikely to have active glomerular disease Azotemia multifactorial including low GFR, high-protein enteral feeds, corticosteroids; slowly improving AHRF with bronchoscopic findings of DAH, serologies as above. Completed pulse Solu-Medrol currently on prednisone 40 mg daily Remains with significant oxygen requirement, PCCM following Completing antibiotics for pneumonia Hypokalemia, improved; continue repletion as necessary Metabolic acidosis, stable DM2, DKA at admission, improved Leukocytosis, improving, likely  multifactorial Anemia  P Hold diuresis today and see how much urine he makes No indications for dialysis Kidney biopsy based upon IR schedule, tentatively 11/4 Medication Issues; Preferred narcotic agents for pain control are hydromorphone, fentanyl, and  methadone. Morphine should not be used.  Baclofen should be avoided Avoid oral sodium phosphate and magnesium citrate based laxatives / bowel preps    Sabra Heck MD 03/30/2023, 9:18 AM  Recent Labs  Lab 03/28/23 1547 03/29/23 0331 03/30/23 0442  NA 134* 136 136  K 3.5 4.2 4.3  CL 103 106 104  CO2 17* 16* 18*  GLUCOSE 146* 181* 177*  BUN 131* 128* 119*  CREATININE 4.52* 4.35* 4.13*  CALCIUM 7.7* 7.6* 7.6*  PHOS 3.1 5.7* 7.3*   Recent Labs  Lab 03/25/23 0437 03/26/23 0335 03/28/23 0404 03/28/23 1008 03/29/23 0331 03/30/23 0442  WBC 19.8*  19.2*   < > 58.0*  --  47.8* 43.9*  NEUTROABS 18.5*  --  51.6*  --   --   --   HGB 8.6*  8.7*   < > 8.8* 9.9* 9.3* 9.9*  HCT 27.1*  27.0*   < > 26.4* 29.0* 27.9* 29.9*  MCV 67.9*  67.5*   < > 66.0*  --  65.0* 66.2*  PLT PLATELET CLUMPS NOTED ON SMEAR, UNABLE TO ESTIMATE  252   < > 310  --  288 260   < > = values in this interval not displayed.

## 2023-03-30 NOTE — Plan of Care (Signed)
  Problem: Respiratory: Goal: Will regain and/or maintain adequate ventilation Outcome: Progressing   Problem: Education: Goal: Knowledge of General Education information will improve Description: Including pain rating scale, medication(s)/side effects and non-pharmacologic comfort measures Outcome: Progressing   Problem: Clinical Measurements: Goal: Ability to maintain clinical measurements within normal limits will improve Outcome: Progressing   Problem: Safety: Goal: Ability to remain free from injury will improve Outcome: Progressing

## 2023-03-31 ENCOUNTER — Inpatient Hospital Stay (HOSPITAL_COMMUNITY): Payer: Self-pay

## 2023-03-31 LAB — BASIC METABOLIC PANEL
Anion gap: 15 (ref 5–15)
BUN: 111 mg/dL — ABNORMAL HIGH (ref 6–20)
CO2: 21 mmol/L — ABNORMAL LOW (ref 22–32)
Calcium: 7.6 mg/dL — ABNORMAL LOW (ref 8.9–10.3)
Chloride: 101 mmol/L (ref 98–111)
Creatinine, Ser: 4.33 mg/dL — ABNORMAL HIGH (ref 0.61–1.24)
GFR, Estimated: 16 mL/min — ABNORMAL LOW (ref >60)
Glucose, Bld: 265 mg/dL — ABNORMAL HIGH (ref 70–99)
Potassium: 4.2 mmol/L (ref 3.5–5.1)
Sodium: 137 mmol/L (ref 135–145)

## 2023-03-31 LAB — CBC
HCT: 31.6 % — ABNORMAL LOW (ref 39.0–52.0)
Hemoglobin: 10.3 g/dL — ABNORMAL LOW (ref 13.0–17.0)
MCH: 21.8 pg — ABNORMAL LOW (ref 26.0–34.0)
MCHC: 32.6 g/dL (ref 30.0–36.0)
MCV: 66.8 fL — ABNORMAL LOW (ref 80.0–100.0)
Platelets: 261 10*3/uL (ref 150–400)
RBC: 4.73 MIL/uL (ref 4.22–5.81)
RDW: 17.9 % — ABNORMAL HIGH (ref 11.5–15.5)
WBC: 34.4 10*3/uL — ABNORMAL HIGH (ref 4.0–10.5)
nRBC: 0.3 % — ABNORMAL HIGH (ref 0.0–0.2)

## 2023-03-31 LAB — GLUCOSE, CAPILLARY
Glucose-Capillary: 304 mg/dL — ABNORMAL HIGH (ref 70–99)
Glucose-Capillary: 364 mg/dL — ABNORMAL HIGH (ref 70–99)
Glucose-Capillary: 160 mg/dL — ABNORMAL HIGH (ref 70–99)
Glucose-Capillary: 112 mg/dL — ABNORMAL HIGH (ref 70–99)

## 2023-03-31 LAB — TYPE AND SCREEN
ABO/RH(D): O POS
Antibody Screen: NEGATIVE

## 2023-03-31 LAB — C-REACTIVE PROTEIN: CRP: 5.4 mg/dL — ABNORMAL HIGH (ref <1.0)

## 2023-03-31 LAB — MAGNESIUM: Magnesium: 2.3 mg/dL (ref 1.7–2.4)

## 2023-03-31 LAB — PROCALCITONIN: Procalcitonin: 2.19 ng/mL

## 2023-03-31 MED ORDER — CARVEDILOL 12.5 MG PO TABS
12.5000 mg | ORAL_TABLET | Freq: Two times a day (BID) | ORAL | Status: DC
  Administered 2023-03-31 – 2023-04-08 (×16): 12.5 mg via ORAL
  Filled 2023-03-31 (×16): qty 1

## 2023-03-31 MED ORDER — PREDNISONE 20 MG PO TABS
20.0000 mg | ORAL_TABLET | Freq: Every day | ORAL | Status: DC
  Administered 2023-03-31 – 2023-04-04 (×5): 20 mg via ORAL
  Filled 2023-03-31 (×5): qty 1

## 2023-03-31 NOTE — Plan of Care (Signed)
  Problem: Respiratory: Goal: Ability to maintain adequate ventilation will improve Outcome: Progressing   Problem: Coping: Goal: Ability to adjust to condition or change in health will improve Outcome: Progressing   Problem: Education: Goal: Knowledge of General Education information will improve Description: Including pain rating scale, medication(s)/side effects and non-pharmacologic comfort measures Outcome: Progressing   Problem: Clinical Measurements: Goal: Will remain free from infection Outcome: Progressing

## 2023-03-31 NOTE — Progress Notes (Signed)
   NAME:  Gary Conway, MRN:  [id number], DOB:  Jul 19, 1975, LOS: 9 ADMISSION DATE:  03/22/2023, CONSULTATION DATE:  03/24/2023 REFERRING MD:  Caleb Popp, CHIEF COMPLAINT:  Acute hypoxemic respiratory failure   History of Present Illness:  47 year old male with PMH of T2DM, spinal cord angioma s/p resection 2015, and obesity who presented to the ED on 10/25 with 1 week of fevers and malaise and was admitted for DKA and severe AKI.  Presented febrile, tachycardic, hypertensive, tachypneic, and hypoxic responsive to 5 L nasal cannula with labs showing hyponatremia, anion gap metabolic acidosis with a gap of 24, creatinine 5.65 from a baseline of 1.1 (2021), leukocytosis of 24, hemoglobin 10.9, lactic acid 4.4, Pro-Cal 141.  He was found to have bilateral opacities chest x-ray and small right pleural effusion.  Treated for DKA and community-acquired pneumonia.  On 10/27 respiratory status worsened and patient was intubated and transferred to the ICU.  Pertinent  Medical History  T2DM Spinal Hemangioma s/p excision 2015  Significant Hospital Events: Including procedures, antibiotic start and stop dates in addition to other pertinent events   10/25: Admitted with DKA, AKI, bilateral pneumonia w/ possible developing ARDS 10/27: Intubated for worsening respiratory status, bronchoscopy w/ alveolitis, POCUS TTE w/ normal RV function, renal ultrasound WNL.  Antibiotics broadened and pulsed dose steroids added 10/28: passing SBTs 10/30: extubated  Interim History / Subjective:  Appears to be doing better Denies any significant concerns at present, no pain no discomfort Denies significant shortness of breath Objective   Blood pressure (!) 128/97, pulse (!) 102, temperature 98.1 F (36.7 C), temperature source Oral, resp. rate 20, height 5\' 8"  (1.727 m), weight 84.2 kg, SpO2 93%.        Intake/Output Summary (Last 24 hours) at 03/31/2023 1335 Last data filed at 03/31/2023 0604 Gross per 24 hour  Intake  273.48 ml  Output 4000 ml  Net -3726.52 ml   Filed Weights   03/26/23 0701 03/30/23 0459 03/31/23 0500  Weight: 89 kg 84.2 kg 84.2 kg    Examination: Constitutional: Middle-age, does not appear to be in distress HENT: Moist oral mucosa Eyes: Anicteric Cardio: Attest and S2 appreciated pulm: Decreased air movement bilaterally Abdomen: Soft, bowel sounds appreciated YNW:[id number] for extremity edema. Skin:Warm and dry. Neuro:Alert and follows commands. No focal deficit noted.  Resolved Hospital Problem list   DKA Severe sepsis  Assessment & Plan:   Acute hypoxemic respiratory failure Diffuse alveolar hemorrhage ARDS Plan is for renal biopsy 11/4 for concerns of pulmonary renal syndrome Sepsis -Resolved  Type 2 diabetes -Continue insulin coverage  Hypertension  Oxygen requirement continues to improve, will suggest to continue to wean down  Can wean down steroids over the next 10 days  Virl Diamond, MD [place] [facility] Pager: See Loretha Stapler

## 2023-03-31 NOTE — Progress Notes (Signed)
Admit: 03/22/2023 LOS: 9  66M renal failure of unclear chronicity, AHRF with findings of alveolar hemorrhage on bronchoscopy.  Vasculitic workup negative.  Subjective:  No interval events BUN down further ,SCR stable 4.33, K 4.2 4.8L UOP Down to 6 L nasal cannula, breathing appears more comfortable Blood pressures are stable, afebrile  11/02 0701 - 11/03 0700 In: 273.5 [I.V.:3; IV Piggyback:270.5] Out: 4800 [Urine:4800]  Filed Weights   03/26/23 0701 03/30/23 0459 03/31/23 0500  Weight: 89 kg 84.2 kg 84.2 kg    Scheduled Meds:  amLODipine  5 mg Oral Daily   arformoterol  15 mcg Nebulization BID   carvedilol  6.25 mg Oral BID WC   Chlorhexidine Gluconate Cloth  6 each Topical Daily   famotidine  10 mg Oral Daily   ferrous sulfate  325 mg Oral Q breakfast   heparin  5,000 Units Subcutaneous Q8H   insulin aspart  0-20 Units Subcutaneous TID WC   insulin aspart  0-5 Units Subcutaneous QHS   insulin aspart  10 Units Subcutaneous TID WC   insulin glargine-yfgn  20 Units Subcutaneous BID   predniSONE  40 mg Oral Q lunch   revefenacin  175 mcg Nebulization Daily   sodium chloride flush  3 mL Intravenous Q12H   Continuous Infusions:  furosemide Stopped (03/31/23 0214)   PRN Meds:.acetaminophen **OR** acetaminophen, guaiFENesin, hydrALAZINE, ipratropium-albuterol, metoprolol tartrate, ondansetron (ZOFRAN) IV, mouth rinse, polyethylene glycol, senna-docusate, traZODone  Current Labs: reviewed   Latest Reference Range & Units 03/24/23 18:30  Anti Nuclear Antibody (ANA) Negative  Negative  ANA Ab, IFA  Negative  ASO 0.0 - 200.0 IU/mL 25.0  CCP Antibodies IgG/IgA 0 - 19 units 6  ds DNA Ab 0 - 9 IU/mL <1  GBM Ab 0.0 - 0.9 units <0.2  RA Latex Turbid. <14.0 IU/mL 23.9 (H)  Cytoplasmic (C-ANCA) Neg:<1:20 titer Neg:<1:20 titer <1:20 <1:20  P-ANCA Neg:<1:20 titer Neg:<1:20 titer <1:20 <1:20  Atypical P-ANCA titer Neg:<1:20 titer Neg:<1:20 titer <1:20 <1:20  Anti-MPO  Antibodies 0.0 - 0.9 units 0.0 - 0.9 units <0.2 <0.2  Anti-PR3 Antibodies 0.0 - 0.9 units 0.0 - 0.9 units 0.6 0.6  C3 Complement 82 - 167 mg/dL 82 - 161 mg/dL 096 045  Complement C4, Body Fluid 12 - 38 mg/dL 36  (H): Data is abnormally high  Physical Exam:  Blood pressure (!) 131/98, pulse (!) 104, temperature 97.9 F (36.6 C), temperature source Oral, resp. rate 20, height 5\' 8"  (1.727 m), weight 84.2 kg, SpO2 94%. Conversant, mild increased work of breathing with tachypnea Regular, borderline tachycardia, normal S1 and S2 Coarse breath sounds bilaterally No significant edema Nonfocal, CN II through XII intact Soft, nontender  A Renal failure of unclear chronicity, nonoliguric Question of GN because of problem #2 Serologies negative Urine without strong suggestion of nephritic sediment Given ongoing uncertainty about pulmonary and renal status plan is for kidney biopsy when stable, tentatively for 11/4, IR following The paper kidney biopsy requisition form is in the paper chart Has been responsive to Lasix as needed -- great UOP past 24h w/o diuretics I think very unlikely to have active glomerular disease Azotemia multifactorial including low GFR, high-protein enteral feeds, corticosteroids; slowly improving No RRT indicaitons AHRF with bronchoscopic findings of DAH, serologies as above. Completed pulse Solu-Medrol currently on prednisone 40 mg daily Remains with significant oxygen requirement, PCCM following Completedantibiotics for pneumonia Hypokalemia, improved; continue repletion as necessary Metabolic acidosis, stable DM2, DKA at admission, improved Leukocytosis, improving, likely multifactorial Anemia  P  Cont to hold diuresis today and see how much urine he makes No indications for dialysis Kidney biopsy based upon IR schedule, tentatively 11/4 Medication Issues; Preferred narcotic agents for pain control are hydromorphone, fentanyl, and methadone. Morphine  should not be used.  Baclofen should be avoided Avoid oral sodium phosphate and magnesium citrate based laxatives / bowel preps    Sabra Heck MD 03/31/2023, 8:32 AM  Recent Labs  Lab 03/28/23 1547 03/29/23 0331 03/30/23 0442 03/31/23 0259  NA 134* 136 136 137  K 3.5 4.2 4.3 4.2  CL 103 106 104 101  CO2 17* 16* 18* 21*  GLUCOSE 146* 181* 177* 265*  BUN 131* 128* 119* 111*  CREATININE 4.52* 4.35* 4.13* 4.33*  CALCIUM 7.7* 7.6* 7.6* 7.6*  PHOS 3.1 5.7* 7.3*  --    Recent Labs  Lab 03/25/23 0437 03/26/23 0335 03/28/23 0404 03/28/23 1008 03/29/23 0331 03/30/23 0442 03/31/23 0259  WBC 19.8*  19.2*   < > 58.0*  --  47.8* 43.9* 34.4*  NEUTROABS 18.5*  --  51.6*  --   --   --   --   HGB 8.6*  8.7*   < > 8.8*   < > 9.3* 9.9* 10.3*  HCT 27.1*  27.0*   < > 26.4*   < > 27.9* 29.9* 31.6*  MCV 67.9*  67.5*   < > 66.0*  --  65.0* 66.2* 66.8*  PLT PLATELET CLUMPS NOTED ON SMEAR, UNABLE TO ESTIMATE  252   < > 310  --  288 260 261   < > = values in this interval not displayed.

## 2023-03-31 NOTE — Progress Notes (Signed)
PROGRESS NOTE    Gary Conway  ZOX:[id number] DOB: 21-Jan-1976 DOA: 03/22/2023 PCP: Pcp, No     Brief Narrative:  47 year old with history of DM 2, spinal cord angioma s/p resection 2015, obesity presented to the ED on 10/25 with 1 week of fever and malaise prior to admission.  Patient was found to have DKA and AKI with signs of sepsis and significant other metabolic abnormalities.  Procalcitonin was ordered 41 and chest x-ray showing significant infiltrate bilaterally.  Initially due to concerns of ARDS development, he was intubated on 10/27 and eventually extubated on 10/30.  In the meantime patient received antibiotics, nephrology team has been following.  There has been concerns of pulmonary renal syndrome therefore currently on steroids.    Subjective:  Patient in bed, appears comfortable, denies any headache, no fever, no chest pain or pressure, no shortness of breath , no abdominal pain. No focal weakness.    Assessment & Plan:     Acute respiratory failure secondary to ARDS/DAH Severe sepsis secondary to multifocal community-acquired pneumonia -Initially intubated on 10/27 and extubated 10/30.  Bronchoscopy status post BAL (10/27).  Patient still remains on 13 L high flow.  Will slowly wean off as appropriate.  Currently patient is on steroids, IV Zosyn, last day 11/2.  Will trend procalcitonin, periodically check BNP.  Chest x-ray shows persistent right-sided opacity, will defer need for CT scan/thoracentesis to pulmonary team. -Bronchodilators, I-S/flutter valve.   Acute kidney injury on CKD stage V Uremia -Creatinine 2021 was 1.1.  Admission creatinine 5.7 > 4.35.  Renal ultrasound is negative.  Nephro considering renal biopsy by IR on 04/01/2023, INR checked type screen done.  Diuretics/fluid therapy per nephro.  Will defer need of sodium bicarb to nephrology.  He is currently on high-dose IV Lasix per nephrology.  Steroids being tapered discussed with pulmonary Dr. Wynona Neat on  03/31/2023.   Essential hypertension -Norvasc, Coreg.  IV as needed   Microcytic anemia -Low iron saturations.  Will place him on supplements.   Severe leukocytosis.  Peripheral smear noted with as expected left shift, leukocytosis likely due to #1 above from pneumonia, no diarrhea, continue to monitor CBC along with inflammatory markers.  Gradually improving with supportive care.  Diabetic ketoacidosis History of diabetes mellitus type 2 - DKA is now resolved.  Sugars are difficult to control as patient is still on steroids.  Continue tapering steroids and adjust insulin.  Lab Results  Component Value Date   HGBA1C 8.1 (H) 03/22/2023   CBG (last 3)  Recent Labs    03/30/23 1643 03/30/23 2033 03/31/23 0828  GLUCAP 153* 200* 304*     DVT prophylaxis: heparin injection 5,000 Units Start: 03/25/23 0600 Code Status: Full code Family Communication:  None Status is: Inpatient Remains inpatient appropriate because: Continue hospital stay as patient still significantly hypoxic.      Examination:   Awake Alert, No new F.N deficits, Normal affect [place].AT,PERRAL Supple Neck, No JVD,   Symmetrical Chest wall movement, Good air movement bilaterally, CTAB RRR,No Gallops, Rubs or new Murmurs,  +ve B.Sounds, Abd Soft, No tenderness,   1+ edema      Diet Orders (From admission, onward)     Start     Ordered   04/01/23 0001  Diet NPO time specified Except for: Sips with Meds  Diet effective midnight       Question:  Except for  Answer:  Sips with Meds   03/29/23 1251   03/27/23 1610  Diet Carb Modified Fluid  consistency: Thin; Room service appropriate? Yes with Assist  Diet effective now       Question Answer Comment  Diet-HS Snack? Nothing   Calorie Level Medium 1600-2000   Fluid consistency: Thin   Room service appropriate? Yes with Assist      03/27/23 1610            Objective: Vitals:   03/31/23 0429 03/31/23 0500 03/31/23 0605 03/31/23 0828  BP: (!) 123/93   (!) 130/100 (!) 131/98  Pulse: 97  (!) 106 (!) 104  Resp: (!) 24  (!) 24 20  Temp:   98 F (36.7 C) 97.9 F (36.6 C)  TempSrc: Axillary  Oral Oral  SpO2: (!) 88%  93% 94%  Weight:  84.2 kg    Height:        Intake/Output Summary (Last 24 hours) at 03/31/2023 1003 Last data filed at 03/31/2023 0604 Gross per 24 hour  Intake 273.48 ml  Output 4800 ml  Net -4526.52 ml   Filed Weights   03/26/23 0701 03/30/23 0459 03/31/23 0500  Weight: 89 kg 84.2 kg 84.2 kg    Scheduled Meds:  arformoterol  15 mcg Nebulization BID   carvedilol  12.5 mg Oral BID WC   Chlorhexidine Gluconate Cloth  6 each Topical Daily   famotidine  10 mg Oral Daily   ferrous sulfate  325 mg Oral Q breakfast   heparin  5,000 Units Subcutaneous Q8H   insulin aspart  0-20 Units Subcutaneous TID WC   insulin aspart  0-5 Units Subcutaneous QHS   insulin aspart  10 Units Subcutaneous TID WC   insulin glargine-yfgn  20 Units Subcutaneous BID   predniSONE  20 mg Oral Q lunch   revefenacin  175 mcg Nebulization Daily   sodium chloride flush  3 mL Intravenous Q12H   Continuous Infusions:  furosemide 120 mg (03/31/23 0948)    Nutritional status Signs/Symptoms: NPO status Interventions: Refer to RD note for recommendations Body mass index is 28.22 kg/m.  Data Reviewed:   Recent Labs  Lab 03/25/23 0437 03/26/23 0335 03/28/23 0233 03/28/23 0404 03/28/23 1008 03/29/23 0331 03/30/23 0442 03/31/23 0259  WBC 19.8*  19.2*   < > 64.1* 58.0*  --  47.8* 43.9* 34.4*  HGB 8.6*  8.7*   < > 9.2* 8.8* 9.9* 9.3* 9.9* 10.3*  HCT 27.1*  27.0*   < > 28.0* 26.4* 29.0* 27.9* 29.9* 31.6*  PLT PLATELET CLUMPS NOTED ON SMEAR, UNABLE TO ESTIMATE  252   < > 347 310  --  288 260 261  MCV 67.9*  67.5*   < > 66.2* 66.0*  --  65.0* 66.2* 66.8*  MCH 21.6*  21.8*   < > 21.7* 22.0*  --  21.7* 21.9* 21.8*  MCHC 31.7  32.2   < > 32.9 33.3  --  33.3 33.1 32.6  RDW 16.3*  16.0*   < > 16.7* 16.3*  --  15.9* 16.4* 17.9*   LYMPHSABS 0.8  --   --  4.1*  --   --   --   --   MONOABS 0.2  --   --  2.3*  --   --   --   --   EOSABS 0.0  --   --  0.0  --   --   --   --   BASOSABS 0.0  --   --  0.0  --   --   --   --    < > =  values in this interval not displayed.    Recent Labs  Lab 03/24/23 1830 03/25/23 0144 03/25/23 0437 03/26/23 1702 03/27/23 0433 03/27/23 1735 03/28/23 0233 03/28/23 1000 03/28/23 1008 03/28/23 1547 03/29/23 0331 03/29/23 0752 03/30/23 0442 03/30/23 0748 03/30/23 1024 03/31/23 0259  NA  --  130*   < >  --  143 143   < > 136 137 134* 136  --  136  --   --  137  K  --  4.2   < >  --  3.2* 3.3*   < > 3.3* 3.4* 3.5 4.2  --  4.3  --   --  4.2  CL  --  95*   < >  --  109 110   < > 105  --  103 106  --  104  --   --  101  CO2  --  18*   < >  --  18* 17*   < > 16*  --  17* 16*  --  18*  --   --  21*  ANIONGAP  --  17*   < >  --  16* 16*   < > 15  --  14 14  --  14  --   --  15  GLUCOSE  --  332*   < >  --  160* 209*   < > 247*  --  146* 181*  --  177*  --   --  265*  BUN  --  91*   < >  --  138* 140*   < > 130*  --  131* 128*  --  119*  --   --  111*  CREATININE  --  4.72*   < >  --  5.06* 4.72*   < > 4.51*  --  4.52* 4.35*  --  4.13*  --   --  4.33*  ALBUMIN  --   --   --   --   --  1.6*  --  1.7*  --  1.8* 1.8*  --   --   --   --   --   CRP 30.0*  --   --   --   --   --   --   --   --   --   --   --   --  6.4*  --  5.4*  PROCALCITON  --  58.82  --   --   --   --   --   --   --   --   --  5.71  --   --  2.80 2.19  BNP  --   --   --   --   --   --   --   --   --   --   --   --  58.7  --   --   --   MG  --   --    < > 3.0* 2.9*  --   --  2.4  --   --   --   --  2.2  --   --  2.3  CALCIUM  --  7.3*   < >  --  7.7* 7.6*   < > 7.4*  --  7.7* 7.6*  --  7.6*  --   --  7.6*   < > = values in this interval not displayed.    Lab Results  Component  Value Date   TRIG 229 (H) 03/25/2023      Recent Labs  Lab 03/24/23 1830 03/25/23 0144 03/25/23 1738 03/26/23 1702 03/27/23 0433  03/27/23 1735 03/28/23 1000 03/28/23 1547 03/29/23 0331 03/29/23 0752 03/30/23 0442 03/30/23 0748 03/30/23 1024 03/31/23 0259  CRP 30.0*  --   --   --   --   --   --   --   --   --   --  6.4*  --  5.4*  PROCALCITON  --  58.82  --   --   --   --   --   --   --  5.71  --   --  2.80 2.19  BNP  --   --   --   --   --   --   --   --   --   --  58.7  --   --   --   MG  --   --    < > 3.0* 2.9*  --  2.4  --   --   --  2.2  --   --  2.3  CALCIUM  --  7.3*   < >  --  7.7*   < > 7.4* 7.7* 7.6*  --  7.6*  --   --  7.6*   < > = values in this interval not displayed.    Recent Results (from the past 240 hour(s))  Culture, blood (routine x 2)     Status: None   Collection Time: 03/22/23 11:43 AM   Specimen: BLOOD  Result Value Ref Range Status   Specimen Description BLOOD BLOOD LEFT FOREARM  Final   Special Requests   Final    BOTTLES DRAWN AEROBIC AND ANAEROBIC Blood Culture adequate volume   Culture   Final    NO GROWTH 5 DAYS Performed at [facility], [address], [place], [place] [address]    Report Status 03/27/2023 FINAL  Final  Resp panel by RT-PCR (RSV, Flu A&B, Covid) Anterior Nasal Swab     Status: None   Collection Time: 03/22/23 11:45 AM   Specimen: Anterior Nasal Swab  Result Value Ref Range Status   SARS Coronavirus 2 by RT PCR NEGATIVE NEGATIVE Final   Influenza A by PCR NEGATIVE NEGATIVE Final   Influenza B by PCR NEGATIVE NEGATIVE Final    Comment: (NOTE) The Xpert Xpress SARS-CoV-2/FLU/RSV plus assay is intended as an aid in the diagnosis of influenza from Nasopharyngeal swab specimens and should not be used as a sole basis for treatment. Nasal washings and aspirates are unacceptable for Xpert Xpress SARS-CoV-2/FLU/RSV testing.  Fact Sheet for Patients: BloggerCourse.com  Fact Sheet for Healthcare Providers: SeriousBroker.it  This test is not yet approved or cleared by the [place] FDA and has been  authorized for detection and/or diagnosis of SARS-CoV-2 by FDA under an Emergency Use Authorization (EUA). This EUA will remain in effect (meaning this test can be used) for the duration of the COVID-19 declaration under Section 564(b)(1) of the Act, 21 U.S.C. section 360bbb-3(b)(1), unless the authorization is terminated or revoked.     Resp Syncytial Virus by PCR NEGATIVE NEGATIVE Final    Comment: (NOTE) Fact Sheet for Patients: BloggerCourse.com  Fact Sheet for Healthcare Providers: SeriousBroker.it  This test is not yet approved or cleared by the [place] FDA and has been authorized for detection and/or diagnosis of SARS-CoV-2 by FDA under an Emergency Use Authorization (EUA). This  EUA will remain in effect (meaning this test can be used) for the duration of the COVID-19 declaration under Section 564(b)(1) of the Act, 21 U.S.C. section 360bbb-3(b)(1), unless the authorization is terminated or revoked.  Performed at [facility], [address], [place], [place] [address]   Culture, blood (routine x 2)     Status: None   Collection Time: 03/22/23  1:25 PM   Specimen: BLOOD  Result Value Ref Range Status   Specimen Description BLOOD RIGHT ANTECUBITAL  Final   Special Requests   Final    BOTTLES DRAWN AEROBIC AND ANAEROBIC Blood Culture results may not be optimal due to an excessive volume of blood received in culture bottles   Culture   Final    NO GROWTH 5 DAYS Performed at [facility], [address] [place], [place] [address]    Report Status 03/27/2023 FINAL  Final  MRSA Next Gen by PCR, Nasal     Status: None   Collection Time: 03/24/23 12:11 PM   Specimen: Nasal Mucosa; Nasal Swab  Result Value Ref Range Status   MRSA by PCR Next Gen NOT DETECTED NOT DETECTED Final    Comment: (NOTE) The GeneXpert MRSA Assay (FDA approved for NASAL specimens only), is one component of a comprehensive MRSA  colonization surveillance program. It is not intended to diagnose MRSA infection nor to guide or monitor treatment for MRSA infections. Test performance is not FDA approved in patients less than 27 years old. Performed at [facility], [address], [place], [place] [address]   Culture, Respiratory w Gram Stain     Status: None   Collection Time: 03/24/23 12:43 PM   Specimen: Bronchoalveolar Lavage; Respiratory  Result Value Ref Range Status   Specimen Description BRONCHIAL ALVEOLAR LAVAGE  Final   Special Requests NONE  Final   Gram Stain   Final    RARE WBC SEEN RARE SQUAMOUS EPITHELIAL CELLS PRESENT NO ORGANISMS SEEN    Culture   Final    NO GROWTH 2 DAYS Performed at [facility], [address] [place], [place] [address]    Report Status 03/27/2023 FINAL  Final    Radiology Studies: CT CHEST ABDOMEN PELVIS WO CONTRAST  Result Date: 03/31/2023 CLINICAL DATA:  Sepsis. History of spinal cord angioma with resection in 2015. 1 week of fever and malaise. DKA. EXAM: CT CHEST, ABDOMEN AND PELVIS WITHOUT CONTRAST TECHNIQUE: Multidetector CT imaging of the chest, abdomen and pelvis was performed following the standard protocol without IV contrast. RADIATION DOSE REDUCTION: This exam was performed according to the departmental dose-optimization program which includes automated exposure control, adjustment of the mA and/or kV according to patient size and/or use of iterative reconstruction technique. COMPARISON:  03/23/2014 FINDINGS: CT CHEST FINDINGS Cardiovascular: Normal heart size. No pericardial effusion. No new acute vascular findings without contrast Mediastinum/Nodes: No mediastinal mass or adenopathy Lungs/Pleura: Very low volume lungs with airspace opacity bilaterally. There is also bandlike opacity with volume loss especially in the right lower lobe, consistent with multi segment collapse. No effusion or air leak. Musculoskeletal: Prior multilevel laminectomy for  reported resection. No acute finding CT ABDOMEN PELVIS FINDINGS Hepatobiliary: No focal liver abnormality.No evidence of biliary obstruction or stone. Pancreas: Unremarkable. Spleen: Unremarkable. Adrenals/Urinary Tract: Negative adrenals. No hydronephrosis or stone. 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 Unremarkable bladder. Stomach/Bowel: Small bowel and colonic fluid levels, colonic fluid levels reach the distal colon. No evidence of bowel inflammation. Vascular/Lymphatic:  No acute vascular abnormality. No mass or adenopathy. Reproductive:No pathologic findings. Other: No ascites or pneumoperitoneum. Musculoskeletal: No acute abnormalities. IMPRESSION: 1. Multifocal pneumonia. Lung volumes are low and there is superimposed atelectasis. 2. Small bowel and colonic fluid levels with mild distension, question enteritis or ileus. Electronically Signed   By: Tiburcio Pea M.D.   On: 03/31/2023 07:28     LOS: 9 days   Signature  -    Susa Raring M.D on 03/31/2023 at 10:03 AM   -  To page go to [email or web address]

## 2023-04-01 LAB — CBC
HCT: 31.5 % — ABNORMAL LOW (ref 39.0–52.0)
Hemoglobin: 10.1 g/dL — ABNORMAL LOW (ref 13.0–17.0)
MCH: 21.8 pg — ABNORMAL LOW (ref 26.0–34.0)
MCHC: 32.1 g/dL (ref 30.0–36.0)
MCV: 67.9 fL — ABNORMAL LOW (ref 80.0–100.0)
Platelets: 207 10*3/uL (ref 150–400)
RBC: 4.64 MIL/uL (ref 4.22–5.81)
RDW: 18.5 % — ABNORMAL HIGH (ref 11.5–15.5)
WBC: 30.1 10*3/uL — ABNORMAL HIGH (ref 4.0–10.5)
nRBC: 0.1 % (ref 0.0–0.2)

## 2023-04-01 LAB — GLUCOSE, CAPILLARY
Glucose-Capillary: 101 mg/dL — ABNORMAL HIGH (ref 70–99)
Glucose-Capillary: 228 mg/dL — ABNORMAL HIGH (ref 70–99)
Glucose-Capillary: 480 mg/dL — ABNORMAL HIGH (ref 70–99)
Glucose-Capillary: 470 mg/dL — ABNORMAL HIGH (ref 70–99)
Glucose-Capillary: 327 mg/dL — ABNORMAL HIGH (ref 70–99)

## 2023-04-01 LAB — BASIC METABOLIC PANEL
Anion gap: 14 (ref 5–15)
BUN: 100 mg/dL — ABNORMAL HIGH (ref 6–20)
CO2: 23 mmol/L (ref 22–32)
Calcium: 7.8 mg/dL — ABNORMAL LOW (ref 8.9–10.3)
Chloride: 101 mmol/L (ref 98–111)
Creatinine, Ser: 3.88 mg/dL — ABNORMAL HIGH (ref 0.61–1.24)
GFR, Estimated: 18 mL/min — ABNORMAL LOW (ref >60)
Glucose, Bld: 97 mg/dL (ref 70–99)
Potassium: 3.5 mmol/L (ref 3.5–5.1)
Sodium: 138 mmol/L (ref 135–145)

## 2023-04-01 LAB — PROTIME-INR
INR: 1.1 (ref 0.8–1.2)
Prothrombin Time: 14.7 s (ref 11.4–15.2)

## 2023-04-01 LAB — C-REACTIVE PROTEIN: CRP: 2.8 mg/dL — ABNORMAL HIGH (ref <1.0)

## 2023-04-01 LAB — MAGNESIUM: Magnesium: 2.1 mg/dL (ref 1.7–2.4)

## 2023-04-01 MED ORDER — INSULIN ASPART 100 UNIT/ML IJ SOLN
9.0000 [IU] | Freq: Three times a day (TID) | INTRAMUSCULAR | Status: DC
Start: 2023-04-02 — End: 2023-04-02

## 2023-04-01 MED ORDER — INSULIN GLARGINE-YFGN 100 UNIT/ML ~~LOC~~ SOLN
20.0000 [IU] | Freq: Two times a day (BID) | SUBCUTANEOUS | Status: DC
  Administered 2023-04-01 – 2023-04-02 (×3): 20 [IU] via SUBCUTANEOUS
  Filled 2023-04-01 (×4): qty 0.2

## 2023-04-01 MED ORDER — INSULIN GLARGINE-YFGN 100 UNIT/ML ~~LOC~~ SOLN
15.0000 [IU] | Freq: Two times a day (BID) | SUBCUTANEOUS | Status: DC
  Filled 2023-04-01: qty 0.15

## 2023-04-01 MED ORDER — INSULIN ASPART 100 UNIT/ML IJ SOLN
7.0000 [IU] | Freq: Once | INTRAMUSCULAR | Status: AC
  Administered 2023-04-01: 7 [IU] via SUBCUTANEOUS

## 2023-04-01 MED ORDER — INSULIN ASPART 100 UNIT/ML IJ SOLN: 6.0000 [IU] | Freq: Once | INTRAMUSCULAR | Status: DC

## 2023-04-01 MED ORDER — INSULIN ASPART 100 UNIT/ML IJ SOLN: 5.0000 [IU] | Freq: Once | INTRAMUSCULAR | Status: DC

## 2023-04-01 MED ORDER — INSULIN ASPART 100 UNIT/ML IJ SOLN
5.0000 [IU] | Freq: Three times a day (TID) | INTRAMUSCULAR | Status: DC
  Administered 2023-04-01: 5 [IU] via SUBCUTANEOUS

## 2023-04-01 MED ORDER — INSULIN ASPART 100 UNIT/ML IJ SOLN
20.0000 [IU] | Freq: Once | INTRAMUSCULAR | Status: AC
  Administered 2023-04-01: 20 [IU] via SUBCUTANEOUS

## 2023-04-01 NOTE — Progress Notes (Addendum)
PROGRESS NOTE    Gary Conway  YQM:[id number] DOB: August 08, 1975 DOA: 03/22/2023 PCP: Pcp, No     Brief Narrative:  47 year old with history of DM 2, spinal cord angioma s/p resection 2015, obesity presented to the ED on 10/25 with 1 week of fever and malaise prior to admission.  Patient was found to have DKA and AKI with signs of sepsis and significant other metabolic abnormalities.  Procalcitonin was ordered 41 and chest x-ray showing significant infiltrate bilaterally.  Initially due to concerns of ARDS development, he was intubated on 10/27 and eventually extubated on 10/30.  In the meantime patient received antibiotics, nephrology team has been following.  There has been concerns of pulmonary renal syndrome therefore currently on steroids.    Subjective:  Patient in bed, appears comfortable, denies any headache, no fever, no chest pain or pressure, no shortness of breath , no abdominal pain. No focal weakness.    Assessment & Plan:     Acute respiratory failure secondary to ARDS/DAH Severe sepsis secondary to multifocal community-acquired pneumonia suspicion for pulmonary renal syndrome -Initially intubated on 10/27 and extubated 10/30.  Bronchoscopy status post BAL (10/27). Seen by pulmonary finished his IV Zosyn, initially on IV steroids now on oral prednisone, case discussed with pulmonary on 11/28/2022 and 04/01/2023.  Oxygen requirements coming down, also on high-dose IV diuretics per renal, encouraged to sit in chair use I-S and flutter valve for pulmonary toiletry and monitor.   Acute kidney injury on CKD stage V Uremia -Creatinine 2021 was 1.1.  Admission creatinine 5.7 > 4.35.  Renal ultrasound is negative.  Nephro considering renal biopsy by IR on 04/01/2023, INR checked type screen done.  Diuretics/fluid therapy per nephro.  Will defer need of sodium bicarb to nephrology.  He is currently on high-dose IV Lasix per nephrology.     Essential hypertension -Norvasc, Coreg.  IV as  needed   Microcytic anemia -Low iron saturations.  Will place him on supplements.   Severe leukocytosis.  Peripheral smear noted with as expected left shift, leukocytosis likely due to #1 above from pneumonia, no diarrhea, continue to monitor CBC along with inflammatory markers.  Gradually improving with supportive care.  Diabetic ketoacidosis History of diabetes mellitus type 2 - DKA is now resolved.  Sugars are difficult to control as patient is still on steroids.  Continue tapering steroids and adjust insulin.  Lab Results  Component Value Date   HGBA1C 8.1 (H) 03/22/2023   CBG (last 3)  Recent Labs    03/31/23 1619 03/31/23 2238 04/01/23 0929  GLUCAP 160* 112* 101*     DVT prophylaxis: heparin injection 5,000 Units Start: 03/25/23 0600 Code Status: Full code Family Communication: Patient's mother Donnella Sham [phone]  on 04/01/2023 Status is: Inpatient Remains inpatient appropriate because: Continue hospital stay as patient still significantly hypoxic.      Examination:   Awake Alert, No new F.N deficits, Normal affect [place].AT,PERRAL Supple Neck, No JVD,   Symmetrical Chest wall movement, Good air movement bilaterally, few fine crackles at the bases RRR,No Gallops, Rubs or new Murmurs,  +ve B.Sounds, Abd Soft, No tenderness,   No edema     Diet Orders (From admission, onward)     Start     Ordered   04/01/23 0001  Diet NPO time specified Except for: Sips with Meds  Diet effective midnight       Question:  Except for  Answer:  Sips with Meds   03/29/23 1251  Objective: Vitals:   03/31/23 2359 04/01/23 0358 04/01/23 0500 04/01/23 0830  BP: (!) 121/93 (!) 132/101  (!) 134/92  Pulse: 96 96  96  Resp: 16 19  (!) 25  Temp: 98 F (36.7 C) 98.1 F (36.7 C)  98.3 F (36.8 C)  TempSrc: Oral Oral  Oral  SpO2: 97% 93%  97%  Weight:   81.2 kg   Height:        Intake/Output Summary (Last 24 hours) at 04/01/2023 1015 Last data filed at 04/01/2023  0402 Gross per 24 hour  Intake 3 ml  Output 2950 ml  Net -2947 ml   Filed Weights   03/30/23 0459 03/31/23 0500 04/01/23 0500  Weight: 84.2 kg 84.2 kg 81.2 kg    Scheduled Meds:  arformoterol  15 mcg Nebulization BID   carvedilol  12.5 mg Oral BID WC   Chlorhexidine Gluconate Cloth  6 each Topical Daily   famotidine  10 mg Oral Daily   ferrous sulfate  325 mg Oral Q breakfast   heparin  5,000 Units Subcutaneous Q8H   insulin aspart  0-20 Units Subcutaneous TID WC   insulin aspart  0-5 Units Subcutaneous QHS   insulin aspart  10 Units Subcutaneous TID WC   insulin glargine-yfgn  20 Units Subcutaneous BID   predniSONE  20 mg Oral Q lunch   revefenacin  175 mcg Nebulization Daily   sodium chloride flush  3 mL Intravenous Q12H   Continuous Infusions:  furosemide Stopped (04/01/23 0245)    Nutritional status Signs/Symptoms: NPO status Interventions: Refer to RD note for recommendations Body mass index is 27.22 kg/m.  Data Reviewed:   Recent Labs  Lab 03/28/23 0404 03/28/23 1008 03/29/23 0331 03/30/23 0442 03/31/23 0259 04/01/23 0439  WBC 58.0*  --  47.8* 43.9* 34.4* 30.1*  HGB 8.8* 9.9* 9.3* 9.9* 10.3* 10.1*  HCT 26.4* 29.0* 27.9* 29.9* 31.6* 31.5*  PLT 310  --  288 260 261 207  MCV 66.0*  --  65.0* 66.2* 66.8* 67.9*  MCH 22.0*  --  21.7* 21.9* 21.8* 21.8*  MCHC 33.3  --  33.3 33.1 32.6 32.1  RDW 16.3*  --  15.9* 16.4* 17.9* 18.5*  LYMPHSABS 4.1*  --   --   --   --   --   MONOABS 2.3*  --   --   --   --   --   EOSABS 0.0  --   --   --   --   --   BASOSABS 0.0  --   --   --   --   --     Recent Labs  Lab 03/27/23 0433 03/27/23 1735 03/28/23 0233 03/28/23 1000 03/28/23 1008 03/28/23 1547 03/29/23 0331 03/29/23 0752 03/30/23 0442 03/30/23 0748 03/30/23 1024 03/31/23 0259 04/01/23 0439  NA 143 143   < > 136   < > 134* 136  --  136  --   --  137 138  K 3.2* 3.3*   < > 3.3*   < > 3.5 4.2  --  4.3  --   --  4.2 3.5  CL 109 110   < > 105  --  103 106   --  104  --   --  101 101  CO2 18* 17*   < > 16*  --  17* 16*  --  18*  --   --  21* 23  ANIONGAP 16* 16*   < > 15  --  14 14  --  14  --   --  15 14  GLUCOSE 160* 209*   < > 247*  --  146* 181*  --  177*  --   --  265* 97  BUN 138* 140*   < > 130*  --  131* 128*  --  119*  --   --  111* 100*  CREATININE 5.06* 4.72*   < > 4.51*  --  4.52* 4.35*  --  4.13*  --   --  4.33* 3.88*  ALBUMIN  --  1.6*  --  1.7*  --  1.8* 1.8*  --   --   --   --   --   --   CRP  --   --   --   --   --   --   --   --   --  6.4*  --  5.4* 2.8*  PROCALCITON  --   --   --   --   --   --   --  5.71  --   --  2.80 2.19  --   INR  --   --   --   --   --   --   --   --   --   --   --   --  1.1  BNP  --   --   --   --   --   --   --   --  58.7  --   --   --   --   MG 2.9*  --   --  2.4  --   --   --   --  2.2  --   --  2.3 2.1  CALCIUM 7.7* 7.6*   < > 7.4*  --  7.7* 7.6*  --  7.6*  --   --  7.6* 7.8*   < > = values in this interval not displayed.    Lab Results  Component Value Date   TRIG 229 (H) 03/25/2023      Recent Labs  Lab 03/27/23 0433 03/27/23 1735 03/28/23 1000 03/28/23 1547 03/29/23 0331 03/29/23 0752 03/30/23 0442 03/30/23 0748 03/30/23 1024 03/31/23 0259 04/01/23 0439  CRP  --   --   --   --   --   --   --  6.4*  --  5.4* 2.8*  PROCALCITON  --   --   --   --   --  5.71  --   --  2.80 2.19  --   INR  --   --   --   --   --   --   --   --   --   --  1.1  BNP  --   --   --   --   --   --  58.7  --   --   --   --   MG 2.9*  --  2.4  --   --   --  2.2  --   --  2.3 2.1  CALCIUM 7.7*   < > 7.4* 7.7* 7.6*  --  7.6*  --   --  7.6* 7.8*   < > = values in this interval not displayed.    Recent Results (from the past 240 hour(s))  Culture, blood (routine x 2)     Status: None   Collection Time: 03/22/23 11:43 AM  Specimen: BLOOD  Result Value Ref Range Status   Specimen Description BLOOD BLOOD LEFT FOREARM  Final   Special Requests   Final    BOTTLES DRAWN AEROBIC AND ANAEROBIC Blood Culture  adequate volume   Culture   Final    NO GROWTH 5 DAYS Performed at [facility], [address] [place], [place] [address]    Report Status 03/27/2023 FINAL  Final  Resp panel by RT-PCR (RSV, Flu A&B, Covid) Anterior Nasal Swab     Status: None   Collection Time: 03/22/23 11:45 AM   Specimen: Anterior Nasal Swab  Result Value Ref Range Status   SARS Coronavirus 2 by RT PCR NEGATIVE NEGATIVE Final   Influenza A by PCR NEGATIVE NEGATIVE Final   Influenza B by PCR NEGATIVE NEGATIVE Final    Comment: (NOTE) The Xpert Xpress SARS-CoV-2/FLU/RSV plus assay is intended as an aid in the diagnosis of influenza from Nasopharyngeal swab specimens and should not be used as a sole basis for treatment. Nasal washings and aspirates are unacceptable for Xpert Xpress SARS-CoV-2/FLU/RSV testing.  Fact Sheet for Patients: BloggerCourse.com  Fact Sheet for Healthcare Providers: SeriousBroker.it  This test is not yet approved or cleared by the [place] FDA and has been authorized for detection and/or diagnosis of SARS-CoV-2 by FDA under an Emergency Use Authorization (EUA). This EUA will remain in effect (meaning this test can be used) for the duration of the COVID-19 declaration under Section 564(b)(1) of the Act, 21 U.S.C. section 360bbb-3(b)(1), unless the authorization is terminated or revoked.     Resp Syncytial Virus by PCR NEGATIVE NEGATIVE Final    Comment: (NOTE) Fact Sheet for Patients: BloggerCourse.com  Fact Sheet for Healthcare Providers: SeriousBroker.it  This test is not yet approved or cleared by the [place] FDA and has been authorized for detection and/or diagnosis of SARS-CoV-2 by FDA under an Emergency Use Authorization (EUA). This EUA will remain in effect (meaning this test can be used) for the duration of the COVID-19 declaration under Section 564(b)(1) of  the Act, 21 U.S.C. section 360bbb-3(b)(1), unless the authorization is terminated or revoked.  Performed at [facility], [address], [place], [place] [address]   Culture, blood (routine x 2)     Status: None   Collection Time: 03/22/23  1:25 PM   Specimen: BLOOD  Result Value Ref Range Status   Specimen Description BLOOD RIGHT ANTECUBITAL  Final   Special Requests   Final    BOTTLES DRAWN AEROBIC AND ANAEROBIC Blood Culture results may not be optimal due to an excessive volume of blood received in culture bottles   Culture   Final    NO GROWTH 5 DAYS Performed at [facility], [address], [place], [place] [address]    Report Status 03/27/2023 FINAL  Final  MRSA Next Gen by PCR, Nasal     Status: None   Collection Time: 03/24/23 12:11 PM   Specimen: Nasal Mucosa; Nasal Swab  Result Value Ref Range Status   MRSA by PCR Next Gen NOT DETECTED NOT DETECTED Final    Comment: (NOTE) The GeneXpert MRSA Assay (FDA approved for NASAL specimens only), is one component of a comprehensive MRSA colonization surveillance program. It is not intended to diagnose MRSA infection nor to guide or monitor treatment for MRSA infections. Test performance is not FDA approved in patients less than 64 years old. Performed at [facility], [address], [place], [place] [address]  Culture, Respiratory w Gram Stain     Status: None   Collection Time: 03/24/23 12:43 PM   Specimen: Bronchoalveolar Lavage; Respiratory  Result Value Ref Range Status   Specimen Description BRONCHIAL ALVEOLAR LAVAGE  Final   Special Requests NONE  Final   Gram Stain   Final    RARE WBC SEEN RARE SQUAMOUS EPITHELIAL CELLS PRESENT NO ORGANISMS SEEN    Culture   Final    NO GROWTH 2 DAYS Performed at [facility], [address], [place], [place] [address]    Report Status 03/27/2023 FINAL  Final    Radiology Studies: CT CHEST ABDOMEN PELVIS WO CONTRAST  Result Date:  03/31/2023 CLINICAL DATA:  Sepsis. History of spinal cord angioma with resection in 2015. 1 week of fever and malaise. DKA. EXAM: CT CHEST, ABDOMEN AND PELVIS WITHOUT CONTRAST TECHNIQUE: Multidetector CT imaging of the chest, abdomen and pelvis was performed following the standard protocol without IV contrast. RADIATION DOSE REDUCTION: This exam was performed according to the departmental dose-optimization program which includes automated exposure control, adjustment of the mA and/or kV according to patient size and/or use of iterative reconstruction technique. COMPARISON:  03/23/2014 FINDINGS: CT CHEST FINDINGS Cardiovascular: Normal heart size. No pericardial effusion. No new acute vascular findings without contrast Mediastinum/Nodes: No mediastinal mass or adenopathy Lungs/Pleura: Very low volume lungs with airspace opacity bilaterally. There is also bandlike opacity with volume loss especially in the right lower lobe, consistent with multi segment collapse. No effusion or air leak. Musculoskeletal: Prior multilevel laminectomy for reported resection. No acute finding CT ABDOMEN PELVIS FINDINGS Hepatobiliary: No focal liver abnormality.No evidence of biliary obstruction or stone. Pancreas: Unremarkable. Spleen: Unremarkable. Adrenals/Urinary Tract: Negative adrenals. No hydronephrosis or stone. 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 Unremarkable bladder. Stomach/Bowel: Small bowel and colonic fluid levels, colonic fluid levels reach the distal colon. No evidence of bowel inflammation. Vascular/Lymphatic: No acute vascular abnormality. No mass or adenopathy. Reproductive:No pathologic findings. Other: No ascites or pneumoperitoneum. Musculoskeletal: No acute abnormalities. IMPRESSION: 1. Multifocal pneumonia. Lung volumes are low and there is superimposed atelectasis. 2. Small bowel and colonic fluid levels with mild distension, question enteritis or ileus. Electronically Signed   By: Tiburcio Pea M.D.   On: 03/31/2023 07:28     LOS: 10 days   Signature  -    Susa Raring M.D on 04/01/2023 at 10:15 AM   -  To page go to [email or web address]

## 2023-04-01 NOTE — Progress Notes (Signed)
Physical Therapy Treatment Patient Details Name: Gary Conway MRN: [id number] DOB: [birth year] Today's Date: 04/01/2023   History of Present Illness Pt is a 47 y/o male presenting to the ED on 10 25 with 1 week of fevers and malaise and was admitted for DKA and severe AKI.  Imaging found bil opacities and a small right pleual effusion, so treated for bil PNA  Intubated 10/27,  extubated 10/30.  PMHx:  DMII, hemangioma of spine resected with laminectomy T2-4.    PT Comments  Pt received in supine and agreeable to session. Pt demonstrates good progress towards functional mobility goals this session. Pt able to complete all mobility tasks with up to min A and demonstrates improved activity tolerance. Pt noted to be incontinent of bowel upon entry and is assisted with pericare. Pt able to tolerate gait trial in the hallway, however demonstrates increased instability with obstacle negotiation and turns due to some impulsivity requiring assist and cues to maintain balance. SpO2 increased to 4L during ambulation to remain [facility]. Pt continues to benefit from PT services to progress toward functional mobility goals.     If plan is discharge home, recommend the following: A little help with walking and/or transfers;A little help with bathing/dressing/bathroom;Assistance with cooking/housework;Assist for transportation;Help with stairs or ramp for entrance   Can travel by private vehicle        Equipment Recommendations  Other (comment) (TBD)    Recommendations for Other Services       Precautions / Restrictions Precautions Precautions: None Restrictions Weight Bearing Restrictions: No     Mobility  Bed Mobility Overal bed mobility: Needs Assistance Bed Mobility: Supine to Sit     Supine to sit: Contact guard, HOB elevated     General bed mobility comments: increased time and assist for line management    Transfers Overall transfer level: Needs assistance Equipment used: Rolling  walker (2 wheels) Transfers: Sit to/from Stand Sit to Stand: Contact guard assist, Min assist           General transfer comment: initial min A for anterior weight shift progressing to CGA    Ambulation/Gait Ambulation/Gait assistance: Min assist, Contact guard assist Gait Distance (Feet): 120 Feet Assistive device: Rolling walker (2 wheels) Gait Pattern/deviations: Step-through pattern, Trunk flexed       General Gait Details: slight unsteadiness with RW support requiring CGA for safety. Increased instability during obstacle negotiation and turns due to pt turning quickly and picking up RW resulting in multiple mild LOB requiring min A to correct. Cues for upright posture and RW management       Balance Overall balance assessment: Needs assistance Sitting-balance support: No upper extremity supported, Single extremity supported, Feet supported Sitting balance-Leahy Scale: Fair Sitting balance - Comments: sitting EOB   Standing balance support: Bilateral upper extremity supported, Reliant on assistive device for balance, During functional activity Standing balance-Leahy Scale: Poor Standing balance comment: with RW support                            Cognition Arousal: Alert Behavior During Therapy: Flat affect Overall Cognitive Status: No family/caregiver present to determine baseline cognitive functioning                                 General Comments: Pt demonstrating some decreased insight and requires cues for safety intermittently        Exercises  General Comments General comments (skin integrity, edema, etc.): Pt on 3L with SpO2 dropping to 86% during ambulation and remaining >90% on 4L. Pt returned to 3L at end of session with SpO2 >90%.      Pertinent Vitals/Pain Pain Assessment Pain Assessment: No/denies pain     PT Goals (current goals can now be found in the care plan section) Acute Rehab PT Goals Patient Stated  Goal: feel better and get out of here independent PT Goal Formulation: With patient Time For Goal Achievement: 04/10/23 Progress towards PT goals: Progressing toward goals    Frequency    Min 1X/week       AM-PAC PT "6 Clicks" Mobility   Outcome Measure  Help needed turning from your back to your side while in a flat bed without using bedrails?: A Little Help needed moving from lying on your back to sitting on the side of a flat bed without using bedrails?: A Little Help needed moving to and from a bed to a chair (including a wheelchair)?: A Little Help needed standing up from a chair using your arms (e.g., wheelchair or bedside chair)?: A Little Help needed to walk in hospital room?: A Little Help needed climbing 3-5 steps with a railing? : A Lot 6 Click Score: 17    End of Session Equipment Utilized During Treatment: Oxygen;Gait belt Activity Tolerance: Patient tolerated treatment well;Patient limited by fatigue Patient left: in chair;with call bell/phone within reach Nurse Communication: Mobility status PT Visit Diagnosis: Other abnormalities of gait and mobility (R26.89)     Time: 1610-9604 PT Time Calculation (min) (ACUTE ONLY): 29 min  Charges:    $Gait Training: 8-22 mins $Therapeutic Activity: 8-22 mins PT General Charges $$ ACUTE PT VISIT: 1 Visit                     Johny Shock, PTA Acute Rehabilitation Services Secure Chat Preferred  [phone]    Johny Shock 04/01/2023, 3:51 PM

## 2023-04-01 NOTE — Plan of Care (Signed)
  Order seen for Random Renal biopsy.  Per chart review, patient still requiring 6 L of O2.  Renal biopsy requires patient to be prone and moderate sedation.  Not medically stable for either at this time.  Will continue to follow patient's chart.   Will proceed with Random Renal biopsy when patient is medically stable.   Jerry Caras Shaunee Mulkern PA-C 04/01/2023 12:17 PM

## 2023-04-01 NOTE — Progress Notes (Signed)
Patient ID: Hearl Heikes, male   DOB: Aug 09, 1975, 47 y.o.   MRN: [id number] S: Reports that he is hungry and his mouth is dry.  Was waiting for kidney biopsy, however this has been postponed due to his oxygen demands. O:BP (!) 134/92 (BP Location: Left Arm)   Pulse 92   Temp 98.3 F (36.8 C) (Oral)   Resp 20   Ht 5\' 8"  (1.727 m)   Wt 81.2 kg   SpO2 95%   BMI 27.22 kg/m   Intake/Output Summary (Last 24 hours) at 04/01/2023 1448 Last data filed at 04/01/2023 1219 Gross per 24 hour  Intake 3 ml  Output 4050 ml  Net -4047 ml   Intake/Output: I/O last 3 completed shifts: In: 6 [I.V.:6] Out: 5450 [Urine:5450]  Intake/Output this shift:  Total I/O In: -  Out: 1100 [Urine:1100] Weight change: -3 kg Gen: NAD CVS: RRR Resp:CTA Abd: +BS, soft, NT/ND Ext: no edema  Recent Labs  Lab 03/26/23 1702 03/27/23 0433 03/27/23 1735 03/28/23 0233 03/28/23 1000 03/28/23 1008 03/28/23 1547 03/29/23 0331 03/30/23 0442 03/31/23 0259 04/01/23 0439  NA  --  143 143 135 136 137 134* 136 136 137 138  K  --  3.2* 3.3* 2.7* 3.3* 3.4* 3.5 4.2 4.3 4.2 3.5  CL  --  109 110 101 105  --  103 106 104 101 101  CO2  --  18* 17* 15* 16*  --  17* 16* 18* 21* 23  GLUCOSE  --  160* 209* 242* 247*  --  146* 181* 177* 265* 97  BUN  --  138* 140* 141* 130*  --  131* 128* 119* 111* 100*  CREATININE  --  5.06* 4.72* 5.04* 4.51*  --  4.52* 4.35* 4.13* 4.33* 3.88*  ALBUMIN  --   --  1.6*  --  1.7*  --  1.8* 1.8*  --   --   --   CALCIUM  --  7.7* 7.6* 7.3* 7.4*  --  7.7* 7.6* 7.6* 7.6* 7.8*  PHOS 4.3 4.9* 4.2  --  4.5  --  3.1 5.7* 7.3*  --   --    Liver Function Tests: Recent Labs  Lab 03/28/23 1000 03/28/23 1547 03/29/23 0331  ALBUMIN 1.7* 1.8* 1.8*   No results for input(s): "LIPASE", "AMYLASE" in the last 168 hours. No results for input(s): "AMMONIA" in the last 168 hours. CBC: Recent Labs  Lab 03/28/23 0404 03/28/23 1008 03/29/23 0331 03/30/23 0442 03/31/23 0259 04/01/23 0439  WBC  58.0*  --  47.8* 43.9* 34.4* 30.1*  NEUTROABS 51.6*  --   --   --   --   --   HGB 8.8*   < > 9.3* 9.9* 10.3* 10.1*  HCT 26.4*   < > 27.9* 29.9* 31.6* 31.5*  MCV 66.0*  --  65.0* 66.2* 66.8* 67.9*  PLT 310  --  288 260 261 207   < > = values in this interval not displayed.   Cardiac Enzymes: No results for input(s): "CKTOTAL", "CKMB", "CKMBINDEX", "TROPONINI" in the last 168 hours. CBG: Recent Labs  Lab 03/31/23 1210 03/31/23 1619 03/31/23 2238 04/01/23 0929 04/01/23 1258  GLUCAP 364* 160* 112* 101* 228*    Iron Studies: No results for input(s): "IRON", "TIBC", "TRANSFERRIN", "FERRITIN" in the last 72 hours. Studies/Results: CT CHEST ABDOMEN PELVIS WO CONTRAST  Result Date: 03/31/2023 CLINICAL DATA:  Sepsis. History of spinal cord angioma with resection in 2015. 1 week of fever and malaise. DKA. EXAM: CT  CHEST, ABDOMEN AND PELVIS WITHOUT CONTRAST TECHNIQUE: Multidetector CT imaging of the chest, abdomen and pelvis was performed following the standard protocol without IV contrast. RADIATION DOSE REDUCTION: This exam was performed according to the departmental dose-optimization program which includes automated exposure control, adjustment of the mA and/or kV according to patient size and/or use of iterative reconstruction technique. COMPARISON:  03/23/2014 FINDINGS: CT CHEST FINDINGS Cardiovascular: Normal heart size. No pericardial effusion. No new acute vascular findings without contrast Mediastinum/Nodes: No mediastinal mass or adenopathy Lungs/Pleura: Very low volume lungs with airspace opacity bilaterally. There is also bandlike opacity with volume loss especially in the right lower lobe, consistent with multi segment collapse. No effusion or air leak. Musculoskeletal: Prior multilevel laminectomy for reported resection. No acute finding CT ABDOMEN PELVIS FINDINGS Hepatobiliary: No focal liver abnormality.No evidence of biliary obstruction or stone. Pancreas: Unremarkable. Spleen:  Unremarkable. Adrenals/Urinary Tract: Negative adrenals. No hydronephrosis or stone. 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 Unremarkable bladder. Stomach/Bowel: Small bowel and colonic fluid levels, colonic fluid levels reach the distal colon. No evidence of bowel inflammation. Vascular/Lymphatic: No acute vascular abnormality. No mass or adenopathy. Reproductive:No pathologic findings. Other: No ascites or pneumoperitoneum. Musculoskeletal: No acute abnormalities. IMPRESSION: 1. Multifocal pneumonia. Lung volumes are low and there is superimposed atelectasis. 2. Small bowel and colonic fluid levels with mild distension, question enteritis or ileus. Electronically Signed   By: Tiburcio Pea M.D.   On: 03/31/2023 07:28    arformoterol  15 mcg Nebulization BID   carvedilol  12.5 mg Oral BID WC   Chlorhexidine Gluconate Cloth  6 each Topical Daily   famotidine  10 mg Oral Daily   ferrous sulfate  325 mg Oral Q breakfast   heparin  5,000 Units Subcutaneous Q8H   insulin aspart  0-20 Units Subcutaneous TID WC   insulin aspart  0-5 Units Subcutaneous QHS   insulin aspart  5 Units Subcutaneous TID WC   insulin glargine-yfgn  15 Units Subcutaneous BID   predniSONE  20 mg Oral Q lunch   revefenacin  175 mcg Nebulization Daily   sodium chloride flush  3 mL Intravenous Q12H    BMET    Component Value Date/Time   NA 138 04/01/2023 0439   K 3.5 04/01/2023 0439   CL 101 04/01/2023 0439   CO2 23 04/01/2023 0439   GLUCOSE 97 04/01/2023 0439   BUN 100 (H) 04/01/2023 0439   CREATININE 3.88 (H) 04/01/2023 0439   CALCIUM 7.8 (L) 04/01/2023 0439   GFRNONAA 18 (L) 04/01/2023 0439   GFRAA >60 09/14/2019 0415   CBC    Component Value Date/Time   WBC 30.1 (H) 04/01/2023 0439   RBC 4.64 04/01/2023 0439   HGB 10.1 (L) 04/01/2023 0439   HCT 31.5 (L) 04/01/2023 0439   PLT 207 04/01/2023 0439   MCV 67.9 (L) 04/01/2023 0439   MCH 21.8 (L) 04/01/2023 0439   MCHC 32.1 04/01/2023 0439    RDW 18.5 (H) 04/01/2023 0439   LYMPHSABS 4.1 (H) 03/28/2023 0404   MONOABS 2.3 (H) 03/28/2023 0404   EOSABS 0.0 03/28/2023 0404   BASOSABS 0.0 03/28/2023 0404    A Renal failure of unclear chronicity, nonoliguric Question of GN because of problem #2 Serologies negative Urine without strong suggestion of nephritic sediment Given ongoing uncertainty about pulmonary and renal status plan is for kidney biopsy when stable, tentatively for 11/5, IR following but postponed today due to oxygen requirements.  He may need to just have local anesthesia The  paper kidney biopsy requisition form is in the paper chart Has been responsive to Lasix as needed -- great UOP past 24h w/o diuretics I think very unlikely to have active glomerular disease Azotemia multifactorial including low GFR, high-protein enteral feeds, corticosteroids; slowly improving No RRT indicaitons AHRF with bronchoscopic findings of DAH, serologies as above. Completed pulse Solu-Medrol currently on prednisone 40 mg daily Remains with significant oxygen requirement, PCCM following Completedantibiotics for pneumonia If biopsy is going to be delayed, may need to start rituximab, although still unclear what the underlying cause is at this time.   Hypokalemia, improved; continue repletion as necessary Metabolic acidosis, stable DM2, DKA at admission, improved Leukocytosis, improving, likely multifactorial Anemia   P Cont to hold diuresis today and see how much urine he makes No indications for dialysis, BUN/Cr slowly improving.  Kidney biopsy based upon IR schedule, tentatively 11/5 and may need local anesthesia if pulmonary status too unstable for conscious sedation. Medication Issues; Preferred narcotic agents for pain control are hydromorphone, fentanyl, and methadone. Morphine should not be used.  Baclofen should be avoided Avoid oral sodium phosphate and magnesium citrate based laxatives / bowel preps   Irena Cords, MD [facility]

## 2023-04-01 NOTE — Progress Notes (Addendum)
   NAME:  Gary Conway, MRN:  [id number], DOB:  1975-09-27, LOS: 10 ADMISSION DATE:  03/22/2023, CONSULTATION DATE:  03/24/2023 REFERRING MD:  Caleb Popp, CHIEF COMPLAINT:  Acute hypoxemic respiratory failure   History of Present Illness:  47 year old male with PMH of T2DM, spinal cord angioma s/p resection 2015, and obesity who presented to the ED on 10/25 with 1 week of fevers and malaise and was admitted for DKA and severe AKI.  Presented febrile, tachycardic, hypertensive, tachypneic, and hypoxic responsive to 5 L nasal cannula with labs showing hyponatremia, anion gap metabolic acidosis with a gap of 24, creatinine 5.65 from a baseline of 1.1 (2021), leukocytosis of 24, hemoglobin 10.9, lactic acid 4.4, Pro-Cal 141.  He was found to have bilateral opacities chest x-ray and small right pleural effusion.  Treated for DKA and community-acquired pneumonia.  On 10/27 respiratory status worsened and patient was intubated and transferred to the ICU.  Pertinent  Medical History  T2DM Spinal Hemangioma s/p excision 2015  Significant Hospital Events: Including procedures, antibiotic start and stop dates in addition to other pertinent events   10/25: Admitted with DKA, AKI, bilateral pneumonia w/ possible developing ARDS 10/27: Intubated for worsening respiratory status, bronchoscopy w/ alveolitis, POCUS TTE w/ normal RV function, renal ultrasound WNL.  Antibiotics broadened and pulsed dose steroids added 10/28: passing SBTs 10/30: extubated  Interim History / Subjective:  Denies complaints, renal biopsy planned today.   Objective   Blood pressure (!) 134/92, pulse 96, temperature 98.3 F (36.8 C), temperature source Oral, resp. rate (!) 25, height 5\' 8"  (1.727 m), weight 81.2 kg, SpO2 97%.        Intake/Output Summary (Last 24 hours) at 04/01/2023 0839 Last data filed at 04/01/2023 0402 Gross per 24 hour  Intake 3 ml  Output 2950 ml  Net -2947 ml   Filed Weights   03/30/23 0459 03/31/23  0500 04/01/23 0500  Weight: 84.2 kg 84.2 kg 81.2 kg    Examination: Constitutional: chronically ill appearing man lying in bed in NAD HENT: [place]/AT, eyes anicteric Cardio: S1S2, RRR pulm: faint rhales, no accessory muscle use or conversational dyspnea, on 6L [place] Abdomen: soft, NT MSK: no peripheral edema or cyanosis Skin: no rashes, warm & dry Neuro: awake, alert, moving all extremities  CRP 2.8   Resolved Hospital Problem list   DKA Severe sepsis  Assessment & Plan:   Acute hypoxemic respiratory failure Diffuse alveolar hemorrhage ARDS -renal biopsy today due to concerns of pulmonary renal syndrome -con't steroids, prednisone 40mg  daily. Will con't to wean slowly. If O2 requirements increase, will need to increase steroids again.  Will make additional recommendations for steroid-sparing regimen once renal biopsy is available.  -needs to be ambulating; PT consulted last week. Progressive mobility.  -appreciate nephrology's management of renal failure  Type 2 diabetes Hypertension Anemia, likely due to pulmonary hemorrhage, prolonged illness -per primary  Steffanie Dunn, DO 04/01/23 8:42 AM [facility]  For contact information, see Amion. If no response to pager, please call PCCM consult pager. After hours, 7PM- 7AM, please call Elink.

## 2023-04-01 NOTE — Plan of Care (Signed)
  Problem: Respiratory: Goal: Ability to maintain adequate ventilation will improve Outcome: Progressing   Problem: Coping: Goal: Ability to adjust to condition or change in health will improve Outcome: Progressing   Problem: Clinical Measurements: Goal: Respiratory complications will improve Outcome: Progressing

## 2023-04-02 ENCOUNTER — Inpatient Hospital Stay (HOSPITAL_COMMUNITY): Payer: Self-pay

## 2023-04-02 DIAGNOSIS — J9601 Acute respiratory failure with hypoxia: Secondary | ICD-10-CM

## 2023-04-02 DIAGNOSIS — R0489 Hemorrhage from other sites in respiratory passages: Secondary | ICD-10-CM

## 2023-04-02 HISTORY — DX: Acute respiratory failure with hypoxia: J96.01

## 2023-04-02 HISTORY — DX: Hemorrhage from other sites in respiratory passages: R04.89

## 2023-04-02 LAB — GLUCOSE, CAPILLARY
Glucose-Capillary: 183 mg/dL — ABNORMAL HIGH (ref 70–99)
Glucose-Capillary: 115 mg/dL — ABNORMAL HIGH (ref 70–99)
Glucose-Capillary: 157 mg/dL — ABNORMAL HIGH (ref 70–99)
Glucose-Capillary: 370 mg/dL — ABNORMAL HIGH (ref 70–99)
Glucose-Capillary: 404 mg/dL — ABNORMAL HIGH (ref 70–99)

## 2023-04-02 LAB — CBC
HCT: 30.4 % — ABNORMAL LOW (ref 39.0–52.0)
Hemoglobin: 9.9 g/dL — ABNORMAL LOW (ref 13.0–17.0)
MCH: 22.4 pg — ABNORMAL LOW (ref 26.0–34.0)
MCHC: 32.6 g/dL (ref 30.0–36.0)
MCV: 68.9 fL — ABNORMAL LOW (ref 80.0–100.0)
Platelets: 183 10*3/uL (ref 150–400)
RBC: 4.41 MIL/uL (ref 4.22–5.81)
RDW: 17.9 % — ABNORMAL HIGH (ref 11.5–15.5)
WBC: 27 10*3/uL — ABNORMAL HIGH (ref 4.0–10.5)
nRBC: 0 % (ref 0.0–0.2)

## 2023-04-02 LAB — C-REACTIVE PROTEIN: CRP: 1.9 mg/dL — ABNORMAL HIGH (ref <1.0)

## 2023-04-02 LAB — BASIC METABOLIC PANEL
Anion gap: 14 (ref 5–15)
BUN: 92 mg/dL — ABNORMAL HIGH (ref 6–20)
CO2: 21 mmol/L — ABNORMAL LOW (ref 22–32)
Calcium: 7.5 mg/dL — ABNORMAL LOW (ref 8.9–10.3)
Chloride: 98 mmol/L (ref 98–111)
Creatinine, Ser: 3.6 mg/dL — ABNORMAL HIGH (ref 0.61–1.24)
GFR, Estimated: 20 mL/min — ABNORMAL LOW (ref >60)
Glucose, Bld: 130 mg/dL — ABNORMAL HIGH (ref 70–99)
Potassium: 3.4 mmol/L — ABNORMAL LOW (ref 3.5–5.1)
Sodium: 133 mmol/L — ABNORMAL LOW (ref 135–145)

## 2023-04-02 LAB — PROCALCITONIN: Procalcitonin: 0.99 ng/mL

## 2023-04-02 LAB — MAGNESIUM: Magnesium: 1.9 mg/dL (ref 1.7–2.4)

## 2023-04-02 MED ORDER — FENTANYL CITRATE (PF) 100 MCG/2ML IJ SOLN
INTRAMUSCULAR | Status: AC
  Filled 2023-04-02: qty 2

## 2023-04-02 MED ORDER — SODIUM CHLORIDE 0.9 % IV SOLN
INTRAVENOUS | Status: AC | PRN
  Administered 2023-04-02: 10 mL/h via INTRAVENOUS

## 2023-04-02 MED ORDER — MIDAZOLAM HCL 2 MG/2ML IJ SOLN
INTRAMUSCULAR | Status: AC
  Filled 2023-04-02: qty 2

## 2023-04-02 MED ORDER — POTASSIUM CHLORIDE CRYS ER 20 MEQ PO TBCR
40.0000 meq | EXTENDED_RELEASE_TABLET | Freq: Once | ORAL | Status: AC
  Administered 2023-04-02: 40 meq via ORAL
  Filled 2023-04-02: qty 2

## 2023-04-02 MED ORDER — MIDAZOLAM HCL 2 MG/2ML IJ SOLN
INTRAMUSCULAR | Status: AC | PRN
  Administered 2023-04-02 (×2): .5 mg via INTRAVENOUS

## 2023-04-02 MED ORDER — HEPARIN SODIUM (PORCINE) 5000 UNIT/ML IJ SOLN
5000.0000 [IU] | Freq: Three times a day (TID) | INTRAMUSCULAR | Status: DC
  Administered 2023-04-03 – 2023-04-08 (×15): 5000 [IU] via SUBCUTANEOUS
  Filled 2023-04-02 (×15): qty 1

## 2023-04-02 MED ORDER — FENTANYL CITRATE (PF) 100 MCG/2ML IJ SOLN
INTRAMUSCULAR | Status: AC | PRN
  Administered 2023-04-02 (×2): 25 ug via INTRAVENOUS

## 2023-04-02 MED ORDER — INSULIN ASPART 100 UNIT/ML IJ SOLN
8.0000 [IU] | Freq: Three times a day (TID) | INTRAMUSCULAR | Status: DC
  Administered 2023-04-02 – 2023-04-03 (×5): 8 [IU] via SUBCUTANEOUS

## 2023-04-02 MED ORDER — LIDOCAINE HCL 1 % IJ SOLN
10.0000 mL | Freq: Once | INTRAMUSCULAR | Status: AC
Start: 2023-04-02 — End: 2023-04-02
  Administered 2023-04-02: 10 mL
  Filled 2023-04-02: qty 10

## 2023-04-02 NOTE — Plan of Care (Signed)
  Problem: Respiratory: Goal: Ability to maintain adequate ventilation will improve Outcome: Progressing   Problem: Fluid Volume: Goal: Ability to maintain a balanced intake and output will improve Outcome: Progressing   Problem: Skin Integrity: Goal: Risk for impaired skin integrity will decrease Outcome: Progressing

## 2023-04-02 NOTE — Progress Notes (Signed)
PROGRESS NOTE    Gary Conway  ZDG:[id number] DOB: 1975-06-19 DOA: 03/22/2023 PCP: Pcp, No     Brief Narrative:  47 year old with history of DM 2, spinal cord angioma s/p resection 2015, obesity presented to the ED on 10/25 with 1 week of fever and malaise prior to admission.  Patient was found to have DKA and AKI with signs of sepsis and significant other metabolic abnormalities.  Procalcitonin was ordered 41 and chest x-ray showing significant infiltrate bilaterally.  Initially due to concerns of ARDS development, he was intubated on 10/27 and eventually extubated on 10/30.  In the meantime patient received antibiotics, nephrology team has been following.  There has been concerns of pulmonary renal syndrome therefore currently on steroids.    Subjective:  Patient in bed, appears comfortable, denies any headache, no fever, no chest pain or pressure, no shortness of breath , no abdominal pain. No new focal weakness.   Assessment & Plan:     Acute respiratory failure secondary to ARDS/DAH Severe sepsis secondary to multifocal community-acquired pneumonia suspicion for pulmonary renal syndrome -Initially intubated on 10/27 and extubated 10/30.  Bronchoscopy status post BAL (10/27). Seen by pulmonary finished his IV Zosyn, initially on IV steroids now on oral prednisone, case discussed with pulmonary on 11/28/2022 and 04/01/2023.  Oxygen requirements coming down considerably with ongoing diuresis, also on high-dose IV diuretics per renal, encouraged to sit in chair use I-S and flutter valve for pulmonary toiletry and monitor.  Clinically much better.   SpO2: 98 % O2 Flow Rate (L/min): 2.5 L/min FiO2 (%): 40 %  Acute kidney injury on CKD stage V Uremia -Creatinine 2021 was 1.1.  Nephrology following patient awaiting renal biopsy by IR on 04/01/2024, responding well to diuretics clinically, creatinine serially improving on high-dose IV Lasix.     Essential hypertension -Norvasc, Coreg.  IV  as needed   Microcytic anemia -Low iron saturations.  Will place him on supplements.   Severe leukocytosis.  Peripheral smear noted with as expected left shift, leukocytosis likely due to #1 above from pneumonia, no diarrhea, continue to monitor CBC along with inflammatory markers.  Gradually improving with supportive care.  Diabetic ketoacidosis History of diabetes mellitus type 2 - DKA is now resolved.  Sugars are difficult to control as patient is still on steroids.  Insulin regimen adjusted for better control.  Continue to monitor.  Lab Results  Component Value Date   HGBA1C 8.1 (H) 03/22/2023   CBG (last 3)  Recent Labs    04/01/23 2113 04/01/23 2310 04/02/23 0204  GLUCAP 470* 327* 183*     DVT prophylaxis: heparin injection 5,000 Units Start: 03/25/23 0600 Code Status: Full code Family Communication: Patient's mother Donnella Sham [phone]  on 04/01/2023 Status is: Inpatient Remains inpatient appropriate because: Continue hospital stay as patient still significantly hypoxic.      Examination:   Awake Alert, No new F.N deficits, Normal affect [place].AT,PERRAL Supple Neck, No JVD,   Symmetrical Chest wall movement, Good air movement bilaterally, few fine crackles at the bases RRR,No Gallops, Rubs or new Murmurs,  +ve B.Sounds, Abd Soft, No tenderness,   No edema     Diet Orders (From admission, onward)     Start     Ordered   04/02/23 0001  Diet NPO time specified Except for: Sips with Meds  Diet effective midnight       Comments: Possible kidney biopsy 11/5  Question:  Except for  Answer:  Sips with Meds   04/01/23 1649  Objective: Vitals:   04/01/23 2032 04/01/23 2314 04/02/23 0332 04/02/23 0500  BP:  (!) 113/90 116/86   Pulse:  94 92   Resp:  19 19   Temp:  97.7 F (36.5 C) 97.8 F (36.6 C)   TempSrc:  Oral Oral   SpO2: 98% 96% 98%   Weight:    81.2 kg  Height:        Intake/Output Summary (Last 24 hours) at 04/02/2023 0800 Last data  filed at 04/02/2023 0606 Gross per 24 hour  Intake 3 ml  Output 3500 ml  Net -3497 ml   Filed Weights   03/31/23 0500 04/01/23 0500 04/02/23 0500  Weight: 84.2 kg 81.2 kg 81.2 kg    Scheduled Meds:  arformoterol  15 mcg Nebulization BID   carvedilol  12.5 mg Oral BID WC   Chlorhexidine Gluconate Cloth  6 each Topical Daily   famotidine  10 mg Oral Daily   ferrous sulfate  325 mg Oral Q breakfast   [START ON 04/03/2023] heparin  5,000 Units Subcutaneous Q8H   insulin aspart  0-20 Units Subcutaneous TID WC   insulin aspart  0-5 Units Subcutaneous QHS   insulin aspart  8 Units Subcutaneous TID WC   insulin glargine-yfgn  20 Units Subcutaneous BID   predniSONE  20 mg Oral Q lunch   revefenacin  175 mcg Nebulization Daily   sodium chloride flush  3 mL Intravenous Q12H   Continuous Infusions:  furosemide Stopped (04/01/23 2131)    Nutritional status Signs/Symptoms: NPO status Interventions: Refer to RD note for recommendations Body mass index is 27.22 kg/m.  Data Reviewed:   Recent Labs  Lab 03/28/23 0404 03/28/23 1008 03/29/23 0331 03/30/23 0442 03/31/23 0259 04/01/23 0439 04/02/23 0432  WBC 58.0*  --  47.8* 43.9* 34.4* 30.1* 27.0*  HGB 8.8*   < > 9.3* 9.9* 10.3* 10.1* 9.9*  HCT 26.4*   < > 27.9* 29.9* 31.6* 31.5* 30.4*  PLT 310  --  288 260 261 207 183  MCV 66.0*  --  65.0* 66.2* 66.8* 67.9* 68.9*  MCH 22.0*  --  21.7* 21.9* 21.8* 21.8* 22.4*  MCHC 33.3  --  33.3 33.1 32.6 32.1 32.6  RDW 16.3*  --  15.9* 16.4* 17.9* 18.5* 17.9*  LYMPHSABS 4.1*  --   --   --   --   --   --   MONOABS 2.3*  --   --   --   --   --   --   EOSABS 0.0  --   --   --   --   --   --   BASOSABS 0.0  --   --   --   --   --   --    < > = values in this interval not displayed.    Recent Labs  Lab 03/27/23 1735 03/28/23 0233 03/28/23 1000 03/28/23 1008 03/28/23 1547 03/29/23 0331 03/29/23 0752 03/30/23 0442 03/30/23 0748 03/30/23 1024 03/31/23 0259 04/01/23 0439 04/02/23 0432   NA 143   < > 136   < > 134* 136  --  136  --   --  137 138 133*  K 3.3*   < > 3.3*   < > 3.5 4.2  --  4.3  --   --  4.2 3.5 3.4*  CL 110   < > 105  --  103 106  --  104  --   --  101 101 98  CO2 17*   < > 16*  --  17* 16*  --  18*  --   --  21* 23 21*  ANIONGAP 16*   < > 15  --  14 14  --  14  --   --  15 14 14   GLUCOSE 209*   < > 247*  --  146* 181*  --  177*  --   --  265* 97 130*  BUN 140*   < > 130*  --  131* 128*  --  119*  --   --  111* 100* 92*  CREATININE 4.72*   < > 4.51*  --  4.52* 4.35*  --  4.13*  --   --  4.33* 3.88* 3.60*  ALBUMIN 1.6*  --  1.7*  --  1.8* 1.8*  --   --   --   --   --   --   --   CRP  --   --   --   --   --   --   --   --  6.4*  --  5.4* 2.8* 1.9*  PROCALCITON  --   --   --   --   --   --  5.71  --   --  2.80 2.19  --   --   INR  --   --   --   --   --   --   --   --   --   --   --  1.1  --   BNP  --   --   --   --   --   --   --  58.7  --   --   --   --   --   MG  --   --  2.4  --   --   --   --  2.2  --   --  2.3 2.1 1.9  CALCIUM 7.6*   < > 7.4*  --  7.7* 7.6*  --  7.6*  --   --  7.6* 7.8* 7.5*   < > = values in this interval not displayed.    Lab Results  Component Value Date   TRIG 229 (H) 03/25/2023      Recent Labs  Lab 03/28/23 1000 03/28/23 1547 03/29/23 0331 03/29/23 0752 03/30/23 0442 03/30/23 0748 03/30/23 1024 03/31/23 0259 04/01/23 0439 04/02/23 0432  CRP  --   --   --   --   --  6.4*  --  5.4* 2.8* 1.9*  PROCALCITON  --   --   --  5.71  --   --  2.80 2.19  --   --   INR  --   --   --   --   --   --   --   --  1.1  --   BNP  --   --   --   --  58.7  --   --   --   --   --   MG 2.4  --   --   --  2.2  --   --  2.3 2.1 1.9  CALCIUM 7.4*   < > 7.6*  --  7.6*  --   --  7.6* 7.8* 7.5*   < > = values in this interval not displayed.    Recent Results (from the past 240 hour(s))  MRSA Next Gen by PCR,  Nasal     Status: None   Collection Time: 03/24/23 12:11 PM   Specimen: Nasal Mucosa; Nasal Swab  Result Value Ref Range Status    MRSA by PCR Next Gen NOT DETECTED NOT DETECTED Final    Comment: (NOTE) The GeneXpert MRSA Assay (FDA approved for NASAL specimens only), is one component of a comprehensive MRSA colonization surveillance program. It is not intended to diagnose MRSA infection nor to guide or monitor treatment for MRSA infections. Test performance is not FDA approved in patients less than 30 years old. Performed at [facility], [address], [place], [place] [address]   Culture, Respiratory w Gram Stain     Status: None   Collection Time: 03/24/23 12:43 PM   Specimen: Bronchoalveolar Lavage; Respiratory  Result Value Ref Range Status   Specimen Description BRONCHIAL ALVEOLAR LAVAGE  Final   Special Requests NONE  Final   Gram Stain   Final    RARE WBC SEEN RARE SQUAMOUS EPITHELIAL CELLS PRESENT NO ORGANISMS SEEN    Culture   Final    NO GROWTH 2 DAYS Performed at [facility], [address] [place], [place] [address]    Report Status 03/27/2023 FINAL  Final    Radiology Studies: No results found.   LOS: 11 days   Signature  -    Susa Raring M.D on 04/02/2023 at 8:00 AM   -  To page go to [email or web address]

## 2023-04-02 NOTE — Progress Notes (Signed)
Patient ID: Gary Conway, male   DOB: 1975-06-10, 47 y.o.   MRN: [id number]  S: He is status post renal biopsy, he is doing well.  He is currently off oxygen, on room air and sating 98%. He denies shortness of breath or chest pain.  O:BP (!) 116/91   Pulse 94   Temp 97.8 F (36.6 C) (Oral)   Resp 15   Ht 5\' 8"  (1.727 m)   Wt 81.2 kg   SpO2 98%   BMI 27.22 kg/m   Intake/Output Summary (Last 24 hours) at 04/02/2023 1412 Last data filed at 04/02/2023 0606 Gross per 24 hour  Intake 3 ml  Output 2400 ml  Net -2397 ml   Intake/Output: I/O last 3 completed shifts: In: 6 [I.V.:6] Out: 5350 [Urine:5350]  Intake/Output this shift:  No intake/output data recorded. Weight change: 0 kg Gen: NAD CVS: RRR Resp:CTA Abd: +BS, soft, NT/ND Ext: no edema  Recent Labs  Lab 03/26/23 1702 03/27/23 0433 03/27/23 0433 03/27/23 1735 03/28/23 0233 03/28/23 1000 03/28/23 1008 03/28/23 1547 03/29/23 0331 03/30/23 0442 03/31/23 0259 04/01/23 0439 04/02/23 0432  NA  --  143   < > 143   < > 136 137 134* 136 136 137 138 133*  K  --  3.2*   < > 3.3*   < > 3.3* 3.4* 3.5 4.2 4.3 4.2 3.5 3.4*  CL  --  109   < > 110   < > 105  --  103 106 104 101 101 98  CO2  --  18*   < > 17*   < > 16*  --  17* 16* 18* 21* 23 21*  GLUCOSE  --  160*   < > 209*   < > 247*  --  146* 181* 177* 265* 97 130*  BUN  --  138*   < > 140*   < > 130*  --  131* 128* 119* 111* 100* 92*  CREATININE  --  5.06*   < > 4.72*   < > 4.51*  --  4.52* 4.35* 4.13* 4.33* 3.88* 3.60*  ALBUMIN  --   --   --  1.6*  --  1.7*  --  1.8* 1.8*  --   --   --   --   CALCIUM  --  7.7*   < > 7.6*   < > 7.4*  --  7.7* 7.6* 7.6* 7.6* 7.8* 7.5*  PHOS 4.3 4.9*  --  4.2  --  4.5  --  3.1 5.7* 7.3*  --   --   --    < > = values in this interval not displayed.   Liver Function Tests: Recent Labs  Lab 03/28/23 1000 03/28/23 1547 03/29/23 0331  ALBUMIN 1.7* 1.8* 1.8*   No results for input(s): "LIPASE", "AMYLASE" in the last 168 hours. No  results for input(s): "AMMONIA" in the last 168 hours. CBC: Recent Labs  Lab 03/28/23 0404 03/28/23 1008 03/29/23 0331 03/30/23 0442 03/31/23 0259 04/01/23 0439 04/02/23 0432  WBC 58.0*  --  47.8* 43.9* 34.4* 30.1* 27.0*  NEUTROABS 51.6*  --   --   --   --   --   --   HGB 8.8*   < > 9.3* 9.9* 10.3* 10.1* 9.9*  HCT 26.4*   < > 27.9* 29.9* 31.6* 31.5* 30.4*  MCV 66.0*  --  65.0* 66.2* 66.8* 67.9* 68.9*  PLT 310  --  288 260 261 207 183   < > =  values in this interval not displayed.   Cardiac Enzymes: No results for input(s): "CKTOTAL", "CKMB", "CKMBINDEX", "TROPONINI" in the last 168 hours. CBG: Recent Labs  Lab 04/01/23 2113 04/01/23 2310 04/02/23 0204 04/02/23 0826 04/02/23 1240  GLUCAP 470* 327* 183* 115* 157*    Iron Studies: No results for input(s): "IRON", "TIBC", "TRANSFERRIN", "FERRITIN" in the last 72 hours. Studies/Results: No results found.  arformoterol  15 mcg Nebulization BID   carvedilol  12.5 mg Oral BID WC   Chlorhexidine Gluconate Cloth  6 each Topical Daily   famotidine  10 mg Oral Daily   ferrous sulfate  325 mg Oral Q breakfast   [START ON 04/03/2023] heparin  5,000 Units Subcutaneous Q8H   insulin aspart  0-20 Units Subcutaneous TID WC   insulin aspart  0-5 Units Subcutaneous QHS   insulin aspart  8 Units Subcutaneous TID WC   insulin glargine-yfgn  20 Units Subcutaneous BID   predniSONE  20 mg Oral Q lunch   revefenacin  175 mcg Nebulization Daily   sodium chloride flush  3 mL Intravenous Q12H    BMET    Component Value Date/Time   NA 133 (L) 04/02/2023 0432   K 3.4 (L) 04/02/2023 0432   CL 98 04/02/2023 0432   CO2 21 (L) 04/02/2023 0432   GLUCOSE 130 (H) 04/02/2023 0432   BUN 92 (H) 04/02/2023 0432   CREATININE 3.60 (H) 04/02/2023 0432   CALCIUM 7.5 (L) 04/02/2023 0432   GFRNONAA 20 (L) 04/02/2023 0432   GFRAA >60 09/14/2019 0415   CBC    Component Value Date/Time   WBC 27.0 (H) 04/02/2023 0432   RBC 4.41 04/02/2023 0432   HGB  9.9 (L) 04/02/2023 0432   HCT 30.4 (L) 04/02/2023 0432   PLT 183 04/02/2023 0432   MCV 68.9 (L) 04/02/2023 0432   MCH 22.4 (L) 04/02/2023 0432   MCHC 32.6 04/02/2023 0432   RDW 17.9 (H) 04/02/2023 0432   LYMPHSABS 4.1 (H) 03/28/2023 0404   MONOABS 2.3 (H) 03/28/2023 0404   EOSABS 0.0 03/28/2023 0404   BASOSABS 0.0 03/28/2023 0404    A Renal failure of unclear chronicity, nonoliguric Question of GN because of problem #2 Serologies negative Urine without strong suggestion of nephritic sediment S/p renal biopsy with IR today, 11/5. The paper kidney biopsy requisition form is in the paper chart Has been responsive to Lasix as needed -- great UOP past 24h w/o diuretics I think very unlikely to have active glomerular disease Azotemia multifactorial including low GFR, high-protein enteral feeds, corticosteroids; slowly improving No RRT indicaitons AHRF with bronchoscopic findings of DAH, serologies as above. Completed pulse Solu-Medrol currently on prednisone 40 mg daily He is off oxygen, currently on room air, sating 96%. Completed antibiotics for pneumonia Follow up the biopsy results. Hypokalemia, improved; continue repletion as necessary Metabolic acidosis, stable DM2, DKA at admission, improved Leukocytosis, improving, likely multifactorial Anemia   P Cont to hold diuresis today and see how much urine he makes No indications for dialysis, BUN/Cr slowly improving.  Medication Issues; Preferred narcotic agents for pain control are hydromorphone, fentanyl, and methadone. Morphine should not be used.  Baclofen should be avoided Avoid oral sodium phosphate and magnesium citrate based laxatives / bowel preps   Laretta Bolster, M.D.  Internal Medicine Resident, PGY-1 [facility]  Pager: [phone] 2:20 PM, 04/02/2023   **Please contact the on call pager after 5 pm and on weekends at [phone].**

## 2023-04-02 NOTE — Progress Notes (Signed)
   NAME:  Gary Conway, MRN:  [id number], DOB:  11-Jun-1975, LOS: 11 ADMISSION DATE:  03/22/2023, CONSULTATION DATE:  03/24/2023 REFERRING MD:  Gary Conway, CHIEF COMPLAINT:  Acute hypoxemic respiratory failure   History of Present Illness:  47 year old male with PMH of T2DM, spinal cord angioma s/p resection 2015, and obesity who presented to the ED on 10/25 with 1 week of fevers and malaise and was admitted for DKA and severe AKI.  Presented febrile, tachycardic, hypertensive, tachypneic, and hypoxic responsive to 5 L nasal cannula with labs showing hyponatremia, anion gap metabolic acidosis with a gap of 24, creatinine 5.65 from a baseline of 1.1 (2021), leukocytosis of 24, hemoglobin 10.9, lactic acid 4.4, Pro-Cal 141.  He was found to have bilateral opacities chest x-ray and small right pleural effusion.  Treated for DKA and community-acquired pneumonia.  On 10/27 respiratory status worsened and patient was intubated and transferred to the ICU.  Pertinent  Medical History  T2DM Spinal Hemangioma s/p excision 2015  Significant Hospital Events: Including procedures, antibiotic start and stop dates in addition to other pertinent events   10/25: Admitted with DKA, AKI, bilateral pneumonia w/ possible developing ARDS 10/27: Intubated for worsening respiratory status, bronchoscopy w/ alveolitis, POCUS TTE w/ normal RV function, renal ultrasound WNL.  Antibiotics broadened and pulsed dose steroids added 10/28: passing SBTs 10/30: extubated  Interim History / Subjective:  Renal biopsy not performed due to oxygen requirements. Down to 3L  [place]> RA this afternoon.   Objective   Blood pressure 116/86, pulse 92, temperature 97.8 F (36.6 C), temperature source Oral, resp. rate 19, height 5\' 8"  (1.727 m), weight 81.2 kg, SpO2 98%.        Intake/Output Summary (Last 24 hours) at 04/02/2023 0709 Last data filed at 04/02/2023 0606 Gross per 24 hour  Intake 3 ml  Output 3500 ml  Net -3497 ml   Filed  Weights   03/31/23 0500 04/01/23 0500 04/02/23 0500  Weight: 84.2 kg 81.2 kg 81.2 kg    Examination: Constitutional: chronically ill appearing man sitting up in bed in NAD HENT: [place]/AT, eyes anicteric Cardio: S1S2, RRR pulm: breathing comfortably saturating 90% on RA Abdomen: soft, NT MSK: no cyanosis or edema Skin: no rashes, warm & dry Neuro: awake, alert, answering questions appropriately.  CRP 2.8> 1.9  Na+ 133 K+ 3.4 Bicarb 21 BUN 92 Cr 3.6 WBC 27 H/H 9.9/30.4 Platelets 183   Resolved Hospital Problem list   DKA Severe sepsis  Assessment & Plan:   Acute hypoxemic respiratory failure Diffuse alveolar hemorrhage; seronegative, but concern remains for inflammatory cause ARDS -renal biopsy today due to concerns of pulmonary renal syndrome -con't steroids, prednisone 20mg  daily. Will make recommendations on ongoing need for immunosuppression once we have renal biopsy results. Anticipate these will be back later this week. From a lung standpoint, he is doing well.  -Progressive mobility.  -Appreciate nephro's management of AKI, which continues to improve.   Type 2 diabetes Hypertension Anemia, likely due to pulmonary hemorrhage, prolonged illness -per primary   Mr. Gary Conway wife was updated with him at bedside during rounds this afternoon.    Steffanie Dunn, DO 04/02/23 4:44 PM [facility]  For contact information, see Amion. If no response to pager, please call PCCM consult pager. After hours, 7PM- 7AM, please call Elink.

## 2023-04-02 NOTE — Procedures (Signed)
Vascular and Interventional Radiology Procedure Note  Patient: Gary Conway DOB: [birth year] Medical Record Number: [id number] Note Date/Time: 04/02/23 9:55 AM   Performing Physician: Roanna Banning, MD Assistant(s): None  Diagnosis: Renal failure of unclear chronicity  Procedure: RENAL BIOPSY, NON-TARGETED  Anesthesia: Conscious Sedation Complications: None Estimated Blood Loss: Minimal Specimens: Sent for Pathology  Findings:  Successful CT-guided biopsy of kidney. A total of 3 samples were obtained. Hemostasis of the tract was achieved using Gelfoam Slurry Embolization.  Plan: Bed rest for 2 hours.  See detailed procedure note with images in PACS. The patient tolerated the procedure well without incident or complication and was returned to Recovery in stable condition.    Roanna Banning, MD Vascular and Interventional Radiology Specialists [facility]   Pager. [phone] Clinic. [phone]

## 2023-04-02 NOTE — Plan of Care (Signed)
  Problem: Respiratory: Goal: Ability to maintain adequate ventilation will improve Outcome: Progressing   Problem: Coping: Goal: Ability to adjust to condition or change in health will improve Outcome: Progressing   Problem: Metabolic: Goal: Ability to maintain appropriate glucose levels will improve Outcome: Progressing

## 2023-04-02 NOTE — TOC CM/SW Note (Signed)
Transition of Care [facility]) - Inpatient Brief Assessment   Patient Details  Name: Gary Conway MRN: [id number] Date of Birth: 01/11/1976  Transition of Care [facility]) CM/SW Contact:    Mearl Latin, LCSW Phone Number: 04/02/2023, 10:31 AM   Clinical Narrative: Patient admitted from home. Awaiting kidney biopsy. No insurance or PCP listed. TOC continuing to follow.    Transition of Care Asessment: Insurance and Status: Selfpay Patient has primary care physician: No Home environment has been reviewed: From home Prior level of function:: Independent Prior/Current Home Services: No current home services Social Determinants of Health Reivew: SDOH reviewed no interventions necessary Readmission risk has been reviewed: Yes Transition of care needs: transition of care needs identified, TOC will continue to follow

## 2023-04-03 LAB — BASIC METABOLIC PANEL
Anion gap: 13 (ref 5–15)
BUN: 90 mg/dL — ABNORMAL HIGH (ref 6–20)
CO2: 22 mmol/L (ref 22–32)
Calcium: 7.8 mg/dL — ABNORMAL LOW (ref 8.9–10.3)
Chloride: 99 mmol/L (ref 98–111)
Creatinine, Ser: 3.66 mg/dL — ABNORMAL HIGH (ref 0.61–1.24)
GFR, Estimated: 19 mL/min — ABNORMAL LOW (ref >60)
Glucose, Bld: 246 mg/dL — ABNORMAL HIGH (ref 70–99)
Potassium: 3.8 mmol/L (ref 3.5–5.1)
Sodium: 134 mmol/L — ABNORMAL LOW (ref 135–145)

## 2023-04-03 LAB — GLUCOSE, CAPILLARY
Glucose-Capillary: 165 mg/dL — ABNORMAL HIGH (ref 70–99)
Glucose-Capillary: 224 mg/dL — ABNORMAL HIGH (ref 70–99)
Glucose-Capillary: 224 mg/dL — ABNORMAL HIGH (ref 70–99)
Glucose-Capillary: 480 mg/dL — ABNORMAL HIGH (ref 70–99)
Glucose-Capillary: 589 mg/dL (ref 70–99)

## 2023-04-03 LAB — CBC
HCT: 29.2 % — ABNORMAL LOW (ref 39.0–52.0)
Hemoglobin: 9.5 g/dL — ABNORMAL LOW (ref 13.0–17.0)
MCH: 22.3 pg — ABNORMAL LOW (ref 26.0–34.0)
MCHC: 32.5 g/dL (ref 30.0–36.0)
MCV: 68.5 fL — ABNORMAL LOW (ref 80.0–100.0)
Platelets: 171 10*3/uL (ref 150–400)
RBC: 4.26 MIL/uL (ref 4.22–5.81)
RDW: 17.9 % — ABNORMAL HIGH (ref 11.5–15.5)
WBC: 21.1 10*3/uL — ABNORMAL HIGH (ref 4.0–10.5)
nRBC: 0 % (ref 0.0–0.2)

## 2023-04-03 LAB — C-REACTIVE PROTEIN: CRP: 1.5 mg/dL — ABNORMAL HIGH (ref <1.0)

## 2023-04-03 LAB — MAGNESIUM: Magnesium: 2.1 mg/dL (ref 1.7–2.4)

## 2023-04-03 MED ORDER — INSULIN ASPART 100 UNIT/ML IJ SOLN
10.0000 [IU] | Freq: Three times a day (TID) | INTRAMUSCULAR | Status: DC
  Administered 2023-04-04 – 2023-04-05 (×4): 10 [IU] via SUBCUTANEOUS

## 2023-04-03 MED ORDER — INSULIN GLARGINE-YFGN 100 UNIT/ML ~~LOC~~ SOLN
24.0000 [IU] | Freq: Two times a day (BID) | SUBCUTANEOUS | Status: DC
  Administered 2023-04-03 (×2): 24 [IU] via SUBCUTANEOUS
  Filled 2023-04-03 (×4): qty 0.24

## 2023-04-03 NOTE — Progress Notes (Addendum)
Patient ID: Gary Conway, male   DOB: 26-Aug-1975, 47 y.o.   MRN: [id number]  S: He was tachypneic overnight, currently on 2 L of oxygen.  He denies shortness of breath.   O:BP 124/87 (BP Location: Left Arm)   Pulse 95   Temp 98.1 F (36.7 C) (Oral)   Resp (!) 22   Ht 5\' 8"  (1.727 m)   Wt 81.2 kg   SpO2 97%   BMI 27.22 kg/m   Intake/Output Summary (Last 24 hours) at 04/03/2023 1100 Last data filed at 04/03/2023 1000 Gross per 24 hour  Intake 440 ml  Output 1900 ml  Net -1460 ml   Intake/Output: I/O last 3 completed shifts: In: 3 [I.V.:3] Out: 3700 [Urine:3700]  Intake/Output this shift:  Total I/O In: 440 [P.O.:440] Out: -  Weight change:  Gen: NAD CVS: RRR Resp:CTA Abd: +BS, soft, NT/ND Ext: no edema  Recent Labs  Lab 03/27/23 1735 03/28/23 0233 03/28/23 1000 03/28/23 1008 03/28/23 1547 03/29/23 0331 03/30/23 0442 03/31/23 0259 04/01/23 0439 04/02/23 0432 04/03/23 0325  NA 143   < > 136   < > 134* 136 136 137 138 133* 134*  K 3.3*   < > 3.3*   < > 3.5 4.2 4.3 4.2 3.5 3.4* 3.8  CL 110   < > 105  --  103 106 104 101 101 98 99  CO2 17*   < > 16*  --  17* 16* 18* 21* 23 21* 22  GLUCOSE 209*   < > 247*  --  146* 181* 177* 265* 97 130* 246*  BUN 140*   < > 130*  --  131* 128* 119* 111* 100* 92* 90*  CREATININE 4.72*   < > 4.51*  --  4.52* 4.35* 4.13* 4.33* 3.88* 3.60* 3.66*  ALBUMIN 1.6*  --  1.7*  --  1.8* 1.8*  --   --   --   --   --   CALCIUM 7.6*   < > 7.4*  --  7.7* 7.6* 7.6* 7.6* 7.8* 7.5* 7.8*  PHOS 4.2  --  4.5  --  3.1 5.7* 7.3*  --   --   --   --    < > = values in this interval not displayed.   Liver Function Tests: Recent Labs  Lab 03/28/23 1000 03/28/23 1547 03/29/23 0331  ALBUMIN 1.7* 1.8* 1.8*   No results for input(s): "LIPASE", "AMYLASE" in the last 168 hours. No results for input(s): "AMMONIA" in the last 168 hours. CBC: Recent Labs  Lab 03/28/23 0404 03/28/23 1008 03/30/23 0442 03/31/23 0259 04/01/23 0439 04/02/23 0432  04/03/23 0325  WBC 58.0*   < > 43.9* 34.4* 30.1* 27.0* 21.1*  NEUTROABS 51.6*  --   --   --   --   --   --   HGB 8.8*   < > 9.9* 10.3* 10.1* 9.9* 9.5*  HCT 26.4*   < > 29.9* 31.6* 31.5* 30.4* 29.2*  MCV 66.0*   < > 66.2* 66.8* 67.9* 68.9* 68.5*  PLT 310   < > 260 261 207 183 171   < > = values in this interval not displayed.   Cardiac Enzymes: No results for input(s): "CKTOTAL", "CKMB", "CKMBINDEX", "TROPONINI" in the last 168 hours. CBG: Recent Labs  Lab 04/02/23 0826 04/02/23 1240 04/02/23 1652 04/02/23 2208 04/03/23 0830  GLUCAP 115* 157* 370* 404* 224*    Iron Studies: No results for input(s): "IRON", "TIBC", "TRANSFERRIN", "FERRITIN" in the last 72  hours. Studies/Results: CT RENAL BIOPSY  Result Date: 04/02/2023 INDICATION: [id number] Renal insufficiency [id number] EXAM: CT-GUIDED NON TARGETED RENAL BIOPSY COMPARISON:  CT CAP, 03/31/2023 MEDICATIONS: None. ANESTHESIA/SEDATION: Moderate (conscious) sedation was employed during this procedure. A total of Versed 1 mg and Fentanyl 50 mcg was administered intravenously. Moderate Sedation Time: 23 minutes. The patient's level of consciousness and vital signs were monitored continuously by radiology nursing throughout the procedure under my direct supervision. CONTRAST:  None. COMPLICATIONS: None immediate. PROCEDURE: RADIATION DOSE REDUCTION: This exam was performed according to the departmental dose-optimization program which includes automated exposure control, adjustment of the mA and/or kV according to patient size and/or use of iterative reconstruction technique. Informed consent was obtained from the patient following an explanation of the procedure, risks, benefits and alternatives. A time out was performed prior to the initiation of the procedure. The patient was positioned prone on the CT table and a limited CT was performed for procedural planning demonstrating the lower pole the LEFT kidney. The procedure was planned. The operative site  was prepped and draped in the usual sterile fashion. Appropriate trajectory was confirmed with a 22 gauge spinal needle after the adjacent tissues were anesthetized with 1% Lidocaine with epinephrine. Under intermittent CT guidance, a 17 gauge coaxial needle was advanced into the peripheral aspect of the LEFT inferior renal pole. Appropriate positioning was confirmed and 4 samples were obtained with an 18 gauge core needle biopsy device. The co-axial needle was removed and hemostasis was achieved with manual compression. A limited postprocedural CT was negative for hemorrhage or additional complication. A dressing was placed. The patient tolerated the procedure well without immediate postprocedural complication. IMPRESSION: Successful CT guided non-targeted LEFT renal core biopsy. Roanna Banning, MD Vascular and Interventional Radiology Specialists [facility] Radiology Electronically Signed   By: Roanna Banning M.D.   On: 04/02/2023 15:32    arformoterol  15 mcg Nebulization BID   carvedilol  12.5 mg Oral BID WC   Chlorhexidine Gluconate Cloth  6 each Topical Daily   famotidine  10 mg Oral Daily   ferrous sulfate  325 mg Oral Q breakfast   heparin  5,000 Units Subcutaneous Q8H   insulin aspart  0-20 Units Subcutaneous TID WC   insulin aspart  0-5 Units Subcutaneous QHS   insulin aspart  8 Units Subcutaneous TID WC   insulin glargine-yfgn  24 Units Subcutaneous BID   predniSONE  20 mg Oral Q lunch   revefenacin  175 mcg Nebulization Daily   sodium chloride flush  3 mL Intravenous Q12H    BMET    Component Value Date/Time   NA 134 (L) 04/03/2023 0325   K 3.8 04/03/2023 0325   CL 99 04/03/2023 0325   CO2 22 04/03/2023 0325   GLUCOSE 246 (H) 04/03/2023 0325   BUN 90 (H) 04/03/2023 0325   CREATININE 3.66 (H) 04/03/2023 0325   CALCIUM 7.8 (L) 04/03/2023 0325   GFRNONAA 19 (L) 04/03/2023 0325   GFRAA >60 09/14/2019 0415   CBC    Component Value Date/Time   WBC 21.1 (H) 04/03/2023 0325   RBC  4.26 04/03/2023 0325   HGB 9.5 (L) 04/03/2023 0325   HCT 29.2 (L) 04/03/2023 0325   PLT 171 04/03/2023 0325   MCV 68.5 (L) 04/03/2023 0325   MCH 22.3 (L) 04/03/2023 0325   MCHC 32.5 04/03/2023 0325   RDW 17.9 (H) 04/03/2023 0325   LYMPHSABS 4.1 (H) 03/28/2023 0404   MONOABS 2.3 (H) 03/28/2023 0404   EOSABS 0.0 03/28/2023  0404   BASOSABS 0.0 03/28/2023 0404    A Renal failure of unclear chronicity, nonoliguric Question of GN because of problem #2 Serologies negative Urine without strong suggestion of nephritic sediment S/p renal biopsy with IR today, 11/5. The paper kidney biopsy requisition form is in the paper chart Has been responsive to Lasix as needed -- great UOP past 24h w/o diuretics I think very unlikely to have active glomerular disease Azotemia multifactorial including low GFR, high-protein enteral feeds, corticosteroids; slowly improving No RRT indicaitons AHRF with bronchoscopic findings of DAH, serologies as above. Completed pulse Solu-Medrol currently on prednisone 40 mg daily He is currently on 2 L of oxygen.  Wean oxygen as tolerated. Completed antibiotics for pneumonia Follow up the biopsy results. Hypokalemia, improved; continue repletion as necessary Metabolic acidosis, stable DM2, DKA at admission, improved Leukocytosis, improving, likely multifactorial Anemia   P Cont to hold diuresis today and see how much urine he makes No indications for dialysis, BUN/Cr slowly improving.  Medication Issues; Preferred narcotic agents for pain control are hydromorphone, fentanyl, and methadone. Morphine should not be used.  Baclofen should be avoided Avoid oral sodium phosphate and magnesium citrate based laxatives / bowel preps   Laretta Bolster, M.D.  Internal Medicine Resident, PGY-1 [facility]  Pager: [phone] 11:00 AM, 04/03/2023   **Please contact the on call pager after 5 pm and on weekends at [phone].**

## 2023-04-03 NOTE — Plan of Care (Signed)
Patient states he feels much better.

## 2023-04-03 NOTE — Progress Notes (Signed)
PROGRESS NOTE    Gary Conway  ZOX:[id number] DOB: 09-Jul-1975 DOA: 03/22/2023 PCP: Pcp, No     Brief Narrative:  47 year old with history of DM 2, spinal cord angioma s/p resection 2015, obesity presented to the ED on 10/25 with 1 week of fever and malaise prior to admission.  Patient was found to have DKA and AKI with signs of sepsis and significant other metabolic abnormalities.  Procalcitonin was ordered 41 and chest x-ray showing significant infiltrate bilaterally.  Initially due to concerns of ARDS development, he was intubated on 10/27 and eventually extubated on 10/30.  In the meantime patient received antibiotics, nephrology team has been following.  There has been concerns of pulmonary renal syndrome therefore currently on steroids.    Subjective: Patient in bed, appears comfortable, denies any headache, no fever, no chest pain or pressure, no shortness of breath , no abdominal pain. No focal weakness.   Assessment & Plan:     Acute respiratory failure secondary to ARDS/DAH Severe sepsis secondary to multifocal community-acquired pneumonia suspicion for pulmonary renal syndrome -Initially intubated on 10/27 and extubated 10/30.  Bronchoscopy status post BAL (10/27). Seen by pulmonary finished his IV Zosyn, initially on IV steroids now on oral prednisone, case discussed with pulmonary on 11/28/2022 and 04/01/2023.  Oxygen requirements coming down considerably with ongoing diuresis, also on high-dose IV diuretics per renal, encouraged to sit in chair use I-S and flutter valve for pulmonary toiletry and monitor.  Clinically much better.   SpO2: 93 % O2 Flow Rate (L/min): 2 L/min FiO2 (%): 28 %  Acute kidney injury on CKD stage V Uremia -Creatinine 2021 was 1.1.  Nephrology on board patient underwent CT-guided renal biopsy on 04/02/2023, pathology results pending, responding well to diuretics clinically, creatinine serially improving on high-dose IV Lasix.     Essential  hypertension -Norvasc, Coreg.  IV as needed   Microcytic anemia -Low iron saturations.  Will place him on supplements.   Severe leukocytosis.  Peripheral smear noted with as expected left shift, leukocytosis likely due to #1 above from pneumonia, no diarrhea, continue to monitor CBC along with inflammatory markers.  Gradually improving with supportive care.  Diabetic ketoacidosis History of diabetes mellitus type 2 - DKA is now resolved.  Sugars are difficult to control as patient is still on steroids.  Insulin regimen adjusted for better control.  Continue to monitor.  Lab Results  Component Value Date   HGBA1C 8.1 (H) 03/22/2023   CBG (last 3)  Recent Labs    04/02/23 1240 04/02/23 1652 04/02/23 2208  GLUCAP 157* 370* 404*     DVT prophylaxis: heparin injection 5,000 Units Start: 03/25/23 0600 Code Status: Full code Family Communication: Patient's mother Donnella Sham [phone]  on 04/01/2023 Status is: Inpatient Remains inpatient appropriate because: Continue hospital stay as patient still significantly hypoxic.      Examination:   Awake Alert, No new F.N deficits, Normal affect [place].AT,PERRAL Supple Neck, No JVD,   Symmetrical Chest wall movement, Good air movement bilaterally, CTAB RRR,No Gallops, Rubs or new Murmurs,  +ve B.Sounds, Abd Soft, No tenderness,   No Cyanosis, Clubbing or edema     Diet Orders (From admission, onward)     Start     Ordered   04/02/23 1200  Diet heart healthy/carb modified Room service appropriate? Yes; Fluid consistency: Thin  Diet effective now       Question Answer Comment  Diet-HS Snack? Nothing   Room service appropriate? Yes   Fluid consistency: Thin  04/02/23 1200            Objective: Vitals:   04/02/23 2000 04/02/23 2340 04/03/23 0319 04/03/23 0746  BP: 118/73 106/76 (!) 113/95   Pulse: (!) 102 97 98 98  Resp: 17 20 (!) 25 18  Temp: 98 F (36.7 C) 97.9 F (36.6 C) 98 F (36.7 C)   TempSrc: Oral Oral Oral    SpO2: 94% 99% 93%   Weight:      Height:        Intake/Output Summary (Last 24 hours) at 04/03/2023 0747 Last data filed at 04/03/2023 0000 Gross per 24 hour  Intake --  Output 1900 ml  Net -1900 ml   Filed Weights   03/31/23 0500 04/01/23 0500 04/02/23 0500  Weight: 84.2 kg 81.2 kg 81.2 kg    Scheduled Meds:  arformoterol  15 mcg Nebulization BID   carvedilol  12.5 mg Oral BID WC   Chlorhexidine Gluconate Cloth  6 each Topical Daily   famotidine  10 mg Oral Daily   ferrous sulfate  325 mg Oral Q breakfast   heparin  5,000 Units Subcutaneous Q8H   insulin aspart  0-20 Units Subcutaneous TID WC   insulin aspart  0-5 Units Subcutaneous QHS   insulin aspart  8 Units Subcutaneous TID WC   insulin glargine-yfgn  24 Units Subcutaneous BID   predniSONE  20 mg Oral Q lunch   revefenacin  175 mcg Nebulization Daily   sodium chloride flush  3 mL Intravenous Q12H   Continuous Infusions:  furosemide 120 mg (04/02/23 1734)    Nutritional status Signs/Symptoms: NPO status Interventions: Refer to RD note for recommendations Body mass index is 27.22 kg/m.  Data Reviewed:   Recent Labs  Lab 03/28/23 0404 03/28/23 1008 03/30/23 0442 03/31/23 0259 04/01/23 0439 04/02/23 0432 04/03/23 0325  WBC 58.0*   < > 43.9* 34.4* 30.1* 27.0* 21.1*  HGB 8.8*   < > 9.9* 10.3* 10.1* 9.9* 9.5*  HCT 26.4*   < > 29.9* 31.6* 31.5* 30.4* 29.2*  PLT 310   < > 260 261 207 183 171  MCV 66.0*   < > 66.2* 66.8* 67.9* 68.9* 68.5*  MCH 22.0*   < > 21.9* 21.8* 21.8* 22.4* 22.3*  MCHC 33.3   < > 33.1 32.6 32.1 32.6 32.5  RDW 16.3*   < > 16.4* 17.9* 18.5* 17.9* 17.9*  LYMPHSABS 4.1*  --   --   --   --   --   --   MONOABS 2.3*  --   --   --   --   --   --   EOSABS 0.0  --   --   --   --   --   --   BASOSABS 0.0  --   --   --   --   --   --    < > = values in this interval not displayed.    Recent Labs  Lab 03/27/23 1735 03/28/23 0233 03/28/23 1000 03/28/23 1008 03/28/23 1547 03/29/23 0331  03/29/23 0752 03/30/23 0442 03/30/23 0748 03/30/23 1024 03/31/23 0259 04/01/23 0439 04/02/23 0432 04/03/23 0325  NA 143   < > 136   < > 134* 136  --  136  --   --  137 138 133* 134*  K 3.3*   < > 3.3*   < > 3.5 4.2  --  4.3  --   --  4.2 3.5 3.4* 3.8  CL 110   < >  105  --  103 106  --  104  --   --  101 101 98 99  CO2 17*   < > 16*  --  17* 16*  --  18*  --   --  21* 23 21* 22  ANIONGAP 16*   < > 15  --  14 14  --  14  --   --  15 14 14 13   GLUCOSE 209*   < > 247*  --  146* 181*  --  177*  --   --  265* 97 130* 246*  BUN 140*   < > 130*  --  131* 128*  --  119*  --   --  111* 100* 92* 90*  CREATININE 4.72*   < > 4.51*  --  4.52* 4.35*  --  4.13*  --   --  4.33* 3.88* 3.60* 3.66*  ALBUMIN 1.6*  --  1.7*  --  1.8* 1.8*  --   --   --   --   --   --   --   --   CRP  --   --   --   --   --   --   --   --  6.4*  --  5.4* 2.8* 1.9* 1.5*  PROCALCITON  --   --   --   --   --   --  5.71  --   --  2.80 2.19  --  0.99  --   INR  --   --   --   --   --   --   --   --   --   --   --  1.1  --   --   BNP  --   --   --   --   --   --   --  58.7  --   --   --   --   --   --   MG  --    < > 2.4  --   --   --   --  2.2  --   --  2.3 2.1 1.9 2.1  CALCIUM 7.6*   < > 7.4*  --  7.7* 7.6*  --  7.6*  --   --  7.6* 7.8* 7.5* 7.8*   < > = values in this interval not displayed.    Lab Results  Component Value Date   TRIG 229 (H) 03/25/2023      Recent Labs  Lab 03/29/23 0752 03/30/23 0442 03/30/23 0748 03/30/23 1024 03/31/23 0259 04/01/23 0439 04/02/23 0432 04/03/23 0325  CRP  --   --  6.4*  --  5.4* 2.8* 1.9* 1.5*  PROCALCITON 5.71  --   --  2.80 2.19  --  0.99  --   INR  --   --   --   --   --  1.1  --   --   BNP  --  58.7  --   --   --   --   --   --   MG  --  2.2  --   --  2.3 2.1 1.9 2.1  CALCIUM  --  7.6*  --   --  7.6* 7.8* 7.5* 7.8*    Recent Results (from the past 240 hour(s))  MRSA Next Gen by PCR, Nasal     Status: None   Collection Time: 03/24/23 12:11 PM  Specimen: Nasal Mucosa;  Nasal Swab  Result Value Ref Range Status   MRSA by PCR Next Gen NOT DETECTED NOT DETECTED Final    Comment: (NOTE) The GeneXpert MRSA Assay (FDA approved for NASAL specimens only), is one component of a comprehensive MRSA colonization surveillance program. It is not intended to diagnose MRSA infection nor to guide or monitor treatment for MRSA infections. Test performance is not FDA approved in patients less than 49 years old. Performed at [facility], [address] [place], [place] [address]   Culture, Respiratory w Gram Stain     Status: None   Collection Time: 03/24/23 12:43 PM   Specimen: Bronchoalveolar Lavage; Respiratory  Result Value Ref Range Status   Specimen Description BRONCHIAL ALVEOLAR LAVAGE  Final   Special Requests NONE  Final   Gram Stain   Final    RARE WBC SEEN RARE SQUAMOUS EPITHELIAL CELLS PRESENT NO ORGANISMS SEEN    Culture   Final    NO GROWTH 2 DAYS Performed at [facility], [address] [place], [place] [address]    Report Status 03/27/2023 FINAL  Final    Radiology Studies: CT RENAL BIOPSY  Result Date: 04/02/2023 INDICATION: [id number] Renal insufficiency [id number] EXAM: CT-GUIDED NON TARGETED RENAL BIOPSY COMPARISON:  CT CAP, 03/31/2023 MEDICATIONS: None. ANESTHESIA/SEDATION: Moderate (conscious) sedation was employed during this procedure. A total of Versed 1 mg and Fentanyl 50 mcg was administered intravenously. Moderate Sedation Time: 23 minutes. The patient's level of consciousness and vital signs were monitored continuously by radiology nursing throughout the procedure under my direct supervision. CONTRAST:  None. COMPLICATIONS: None immediate. PROCEDURE: RADIATION DOSE REDUCTION: This exam was performed according to the departmental dose-optimization program which includes automated exposure control, adjustment of the mA and/or kV according to patient size and/or use of iterative reconstruction technique. Informed consent was obtained  from the patient following an explanation of the procedure, risks, benefits and alternatives. A time out was performed prior to the initiation of the procedure. The patient was positioned prone on the CT table and a limited CT was performed for procedural planning demonstrating the lower pole the LEFT kidney. The procedure was planned. The operative site was prepped and draped in the usual sterile fashion. Appropriate trajectory was confirmed with a 22 gauge spinal needle after the adjacent tissues were anesthetized with 1% Lidocaine with epinephrine. Under intermittent CT guidance, a 17 gauge coaxial needle was advanced into the peripheral aspect of the LEFT inferior renal pole. Appropriate positioning was confirmed and 4 samples were obtained with an 18 gauge core needle biopsy device. The co-axial needle was removed and hemostasis was achieved with manual compression. A limited postprocedural CT was negative for hemorrhage or additional complication. A dressing was placed. The patient tolerated the procedure well without immediate postprocedural complication. IMPRESSION: Successful CT guided non-targeted LEFT renal core biopsy. Roanna Banning, MD [facility] Radiology Electronically Signed   By: Roanna Banning M.D.   On: 04/02/2023 15:32     LOS: 12 days   Signature  -    Susa Raring M.D on 04/03/2023 at 7:47 AM   -  To page go to [email or web address]

## 2023-04-03 NOTE — Progress Notes (Signed)
Physical Therapy Treatment Patient Details Name: Khayri Kargbo MRN: [id number] DOB: 10/01/1975 Today's Date: 04/03/2023   History of Present Illness Pt is a 47 y/o male presenting to the ED on 10 25 with 1 week of fevers and malaise and was admitted for DKA and severe AKI.  Imaging found bil opacities and a small right pleual effusion, so treated for bil PNA  Intubated 10/27,  extubated 10/30.  PMHx:  DMII, hemangioma of spine resected with laminectomy T2-4.    PT Comments  Pt tolerated treatment well today. Pt able to ambulate in hallway with RW supervision. Pt recieved on .5L at 96%. Pt titrated to RA at 96%. Pt mostly stayed in the 90s on RA however briefly dropped to 76 on RA but pleth was questionable at best. Pt titrated to 2L and instantly recovered to 95%. Pt left on 0.5L at 96%. No change in DC/DME recs at this time. PT will continue to follow.    If plan is discharge home, recommend the following: A little help with walking and/or transfers;A little help with bathing/dressing/bathroom;Assistance with cooking/housework;Assist for transportation;Help with stairs or ramp for entrance   Can travel by private vehicle        Equipment Recommendations  Rolling walker (2 wheels)    Recommendations for Other Services       Precautions / Restrictions Precautions Precautions: None Restrictions Weight Bearing Restrictions: No     Mobility  Bed Mobility Overal bed mobility: Needs Assistance Bed Mobility: Sit to Supine, Supine to Sit     Supine to sit: Supervision Sit to supine: Supervision   General bed mobility comments: increased time and assist for line management    Transfers Overall transfer level: Needs assistance Equipment used: Rolling walker (2 wheels) Transfers: Sit to/from Stand Sit to Stand: Supervision           General transfer comment: no LOB noted.    Ambulation/Gait Ambulation/Gait assistance: Supervision Gait Distance (Feet): 250  Feet Assistive device: Rolling walker (2 wheels) Gait Pattern/deviations: Step-through pattern, Trunk flexed Gait velocity: decreased Gait velocity interpretation: 1.31 - 2.62 ft/sec, indicative of limited community ambulator   General Gait Details: no LOB noted. 1 standing rest break to check O2 sats.   Stairs             Wheelchair Mobility     Tilt Bed    Modified Rankin (Stroke Patients Only)       Balance Overall balance assessment: Needs assistance Sitting-balance support: No upper extremity supported, Single extremity supported, Feet supported Sitting balance-Leahy Scale: Fair Sitting balance - Comments: sitting EOB   Standing balance support: Bilateral upper extremity supported, Reliant on assistive device for balance, During functional activity Standing balance-Leahy Scale: Fair Standing balance comment: with RW support                            Cognition Arousal: Alert Behavior During Therapy: Flat affect Overall Cognitive Status: No family/caregiver present to determine baseline cognitive functioning                                 General Comments: Pt with very flat affect however otherwise followed instructions.        Exercises      General Comments General comments (skin integrity, edema, etc.): Pt recieved on .5L at 96%. Pt titrated to RA at 96%. Pt mostly stayed in the  90s on RA however briefly dropped to 76 on RA but pleth was questionable at best. Pt titrated to 2L and instantly recovered to 95%. Pt left on 0.5L at 96%.      Pertinent Vitals/Pain Pain Assessment Pain Assessment: No/denies pain    Home Living                          Prior Function            PT Goals (current goals can now be found in the care plan section) Progress towards PT goals: Progressing toward goals    Frequency    Min 1X/week      PT Plan      Co-evaluation              AM-PAC PT "6 Clicks" Mobility    Outcome Measure  Help needed turning from your back to your side while in a flat bed without using bedrails?: A Little Help needed moving from lying on your back to sitting on the side of a flat bed without using bedrails?: A Little Help needed moving to and from a bed to a chair (including a wheelchair)?: A Little Help needed standing up from a chair using your arms (e.g., wheelchair or bedside chair)?: A Little Help needed to walk in hospital room?: A Little Help needed climbing 3-5 steps with a railing? : A Lot 6 Click Score: 17    End of Session Equipment Utilized During Treatment: Oxygen;Gait belt Activity Tolerance: Patient tolerated treatment well;Patient limited by fatigue Patient left: in bed;with call bell/phone within reach;with bed alarm set Nurse Communication: Mobility status PT Visit Diagnosis: Other abnormalities of gait and mobility (R26.89)     Time: 2956-2130 PT Time Calculation (min) (ACUTE ONLY): 23 min  Charges:    $Gait Training: 23-37 mins PT General Charges $$ ACUTE PT VISIT: 1 Visit                     Shela Nevin, PT, DPT Acute Rehab Services [phone]    Gladys Damme 04/03/2023, 12:27 PM

## 2023-04-03 NOTE — Progress Notes (Signed)
Mobility Specialist Progress Note;   04/03/23 1015  Mobility  Activity Ambulated with assistance in hallway  Level of Assistance Contact guard assist, steadying assist  Assistive Device Front wheel walker  Distance Ambulated (ft) 200 ft  Activity Response Tolerated well  Mobility Referral Yes  $Mobility charge 1 Mobility  Mobility Specialist Start Time (ACUTE ONLY) 1015  Mobility Specialist Stop Time (ACUTE ONLY) 1040  Mobility Specialist Time Calculation (min) (ACUTE ONLY) 25 min    Pre-mobility: SPO2 95% 0.5LO2 During-mobility: SPO2 88-93% 3LO2 Post-mobility: SPO2 95% 0.5LO2  Pt agreeable to mobility. Required minG assistance with STS and during ambulation. No c/o pain during session. Stated they did not have him on O2 during the night and he woke up with it in his nose this AM. On 0.5LO2 upon entry, however ambulated on 3LO2 d/t quick desat. Pt c/o being in soiled bed, linens changed at EOS. Pt back in bed with all needs met, left on 0.5LO2.  Caesar Bookman Mobility Specialist Please contact via SecureChat or Rehab Office [phone]

## 2023-04-03 NOTE — Plan of Care (Signed)
  Problem: Clinical Measurements: Goal: Diagnostic test results will improve Outcome: Progressing Goal: Signs and symptoms of infection will decrease Outcome: Progressing   Problem: Education: Goal: Knowledge of General Education information will improve Description: Including pain rating scale, medication(s)/side effects and non-pharmacologic comfort measures Outcome: Progressing   Problem: Health Behavior/Discharge Planning: Goal: Ability to manage health-related needs will improve Outcome: Progressing   Problem: Clinical Measurements: Goal: Ability to maintain clinical measurements within normal limits will improve Outcome: Progressing

## 2023-04-04 LAB — BASIC METABOLIC PANEL
Anion gap: 18 — ABNORMAL HIGH (ref 5–15)
BUN: 84 mg/dL — ABNORMAL HIGH (ref 6–20)
CO2: 20 mmol/L — ABNORMAL LOW (ref 22–32)
Calcium: 8.1 mg/dL — ABNORMAL LOW (ref 8.9–10.3)
Chloride: 99 mmol/L (ref 98–111)
Creatinine, Ser: 3.61 mg/dL — ABNORMAL HIGH (ref 0.61–1.24)
GFR, Estimated: 20 mL/min — ABNORMAL LOW (ref >60)
Glucose, Bld: 164 mg/dL — ABNORMAL HIGH (ref 70–99)
Potassium: 3.7 mmol/L (ref 3.5–5.1)
Sodium: 137 mmol/L (ref 135–145)

## 2023-04-04 LAB — GLUCOSE, CAPILLARY
Glucose-Capillary: 179 mg/dL — ABNORMAL HIGH (ref 70–99)
Glucose-Capillary: 358 mg/dL — ABNORMAL HIGH (ref 70–99)
Glucose-Capillary: 192 mg/dL — ABNORMAL HIGH (ref 70–99)
Glucose-Capillary: 221 mg/dL — ABNORMAL HIGH (ref 70–99)
Glucose-Capillary: 291 mg/dL — ABNORMAL HIGH (ref 70–99)

## 2023-04-04 LAB — MAGNESIUM: Magnesium: 2.1 mg/dL (ref 1.7–2.4)

## 2023-04-04 LAB — CBC
HCT: 30.3 % — ABNORMAL LOW (ref 39.0–52.0)
Hemoglobin: 10.1 g/dL — ABNORMAL LOW (ref 13.0–17.0)
MCH: 23 pg — ABNORMAL LOW (ref 26.0–34.0)
MCHC: 33.3 g/dL (ref 30.0–36.0)
MCV: 69 fL — ABNORMAL LOW (ref 80.0–100.0)
Platelets: 149 10*3/uL — ABNORMAL LOW (ref 150–400)
RBC: 4.39 MIL/uL (ref 4.22–5.81)
RDW: 18.4 % — ABNORMAL HIGH (ref 11.5–15.5)
WBC: 18 10*3/uL — ABNORMAL HIGH (ref 4.0–10.5)
nRBC: 0 % (ref 0.0–0.2)

## 2023-04-04 MED ORDER — INSULIN GLARGINE-YFGN 100 UNIT/ML ~~LOC~~ SOLN
28.0000 [IU] | Freq: Two times a day (BID) | SUBCUTANEOUS | Status: DC
Start: 2023-04-04 — End: 2023-04-08
  Administered 2023-04-04 – 2023-04-08 (×9): 28 [IU] via SUBCUTANEOUS
  Filled 2023-04-04 (×11): qty 0.28

## 2023-04-04 MED ORDER — ENSURE ENLIVE PO LIQD
237.0000 mL | Freq: Two times a day (BID) | ORAL | Status: DC
  Administered 2023-04-04 – 2023-04-07 (×5): 237 mL via ORAL

## 2023-04-04 NOTE — Progress Notes (Signed)
Nutrition Follow-up  DOCUMENTATION CODES:   Not applicable  INTERVENTION:  Ensure Plus High Protein po BID, each supplement provides 350 kcal and 20 grams of protein.    NUTRITION DIAGNOSIS:   Increased nutrient needs related to acute illness as evidenced by estimated needs.    GOAL:   Patient will meet greater than or equal to 90% of their needs    MONITOR:   PO intake, Supplement acceptance, Labs, Weight trends  REASON FOR ASSESSMENT:   Ventilator, Consult Enteral/tube feeding initiation and management  ASSESSMENT:   Pt with hx of DM type 2 presented to ED with ongoing fevers and was found to have sepsis secondary to pneumonia. Patient has ongoing diuresis during current hospital stay. Weight changes can be expected in relation to fluid shifts. Meal intake appears to be good with no changes noted. Renal biopsy on 04/02/23 results pending. Per rounds possible discharge in the next day or 2 Patient stated that his appetite was good. With no changes. Yesterday meal intake 60-100% Average. Suspect current diet with po intake providing adequate nutrition.  10/25 Presented to ED with 1 week fever 10/27 Intubated 10/28-30: TF; vital AF 63ml/hr 10/30 extubated 11/30- Oral diet Carb modified  Diet history: 04/02/23 1200  Diet heart healthy/carb modified Room service appropriate? Yes; Fluid consistency: Thin  Diet effective now         04/02/23 0001  Diet NPO time specified Except for: Sips with Meds  Diet effective midnight,   Status:  Canceled       Comments:  Possible kidney biopsy 11/5    04/01/23 1214  Diet heart healthy/carb modified Room service appropriate? Yes; Fluid consistency: Thin  Diet effective now,   Status:  Canceled         04/01/23 0001  Diet NPO time specified Except for: Sips with Meds  Diet effective midnight,   Status:  Canceled         03/27/23 1610  Diet Carb Modified Fluid consistency: Thin; Room service appropriate? Yes with Assist  Diet effective  now,   Status:  Canceled               Weight during hospital course: 04/04/23 0500 77 kg 169.75 lbs  04/02/23 0500 81.2 kg 179.01 lbs  04/01/23 0500 81.2 kg 179.01 lbs  03/31/23 0500 84.2 kg 185.63 lbs  03/30/23 0459 84.2 kg 185.63 lbs  03/26/23 0701 89 kg 196.21 lbs  03/22/23 2056 86.3 kg 190.3 lbs  03/22/23 1122 86.2 k     Meds reviewed Labs reviewed  NUTRITION - FOCUSED PHYSICAL EXAM:  Flowsheet Row Most Recent Value  Orbital Region No depletion  Upper Arm Region No depletion  Thoracic and Lumbar Region No depletion  Buccal Region No depletion  Temple Region No depletion  Clavicle Bone Region No depletion  Clavicle and Acromion Bone Region No depletion  Scapular Bone Region No depletion  Dorsal Hand Unable to assess  [mittens]  Patellar Region No depletion  Anterior Thigh Region No depletion  Posterior Calf Region No depletion  Edema (RD Assessment) None  Hair Reviewed  Eyes Reviewed  Mouth Reviewed  Skin Reviewed  Nails Reviewed       Diet Order:   Diet Order             Diet heart healthy/carb modified Room service appropriate? Yes; Fluid consistency: Thin  Diet effective now                   EDUCATION  NEEDS:   Education needs have been addressed  Skin:  Skin Assessment: Reviewed RN Assessment  Last BM:  04/02/2023  Height:   Ht Readings from Last 1 Encounters:  03/22/23 5\' 8"  (1.727 m)    Weight:   Wt Readings from Last 1 Encounters:  04/04/23 77 kg    Ideal Body Weight:  70 kg  BMI:  Body mass index is 25.81 kg/m.  Estimated Nutritional Needs:   Kcal:  2300-2600 kcal/d  Protein:  100-115 g/d  Fluid:  1900-2300 ml/day    Jamelle Haring RDN, LDN Clinical Dietitian  RDN pager # available on Amion

## 2023-04-04 NOTE — Progress Notes (Signed)
Patient ID: Gary Conway, male   DOB: 04-25-1976, 47 y.o.   MRN: [id number]  S: He is doing well.  He is off oxygen, and satting 94%.  He is not hypoxic with ambulation.   O:BP 111/84 (BP Location: Left Arm)   Pulse (!) 105   Temp 97.9 F (36.6 C) (Oral)   Resp 20   Ht 5\' 8"  (1.727 m)   Wt 77 kg   SpO2 92%   BMI 25.81 kg/m   Intake/Output Summary (Last 24 hours) at 04/04/2023 1431 Last data filed at 04/04/2023 0842 Gross per 24 hour  Intake 420 ml  Output 2900 ml  Net -2480 ml   Intake/Output: I/O last 3 completed shifts: In: 1200 [P.O.:1200] Out: 3400 [Urine:3400]  Intake/Output this shift:  Total I/O In: -  Out: 400 [Urine:400] Weight change:  Gen: NAD CVS: RRR Resp:CTA Abd: +BS, soft, NT/ND Ext: no edema  Recent Labs  Lab 03/28/23 1547 03/29/23 0331 03/30/23 0442 03/31/23 0259 04/01/23 0439 04/02/23 0432 04/03/23 0325 04/04/23 0443  NA 134* 136 136 137 138 133* 134* 137  K 3.5 4.2 4.3 4.2 3.5 3.4* 3.8 3.7  CL 103 106 104 101 101 98 99 99  CO2 17* 16* 18* 21* 23 21* 22 20*  GLUCOSE 146* 181* 177* 265* 97 130* 246* 164*  BUN 131* 128* 119* 111* 100* 92* 90* 84*  CREATININE 4.52* 4.35* 4.13* 4.33* 3.88* 3.60* 3.66* 3.61*  ALBUMIN 1.8* 1.8*  --   --   --   --   --   --   CALCIUM 7.7* 7.6* 7.6* 7.6* 7.8* 7.5* 7.8* 8.1*  PHOS 3.1 5.7* 7.3*  --   --   --   --   --    Liver Function Tests: Recent Labs  Lab 03/28/23 1547 03/29/23 0331  ALBUMIN 1.8* 1.8*   No results for input(s): "LIPASE", "AMYLASE" in the last 168 hours. No results for input(s): "AMMONIA" in the last 168 hours. CBC: Recent Labs  Lab 03/31/23 0259 04/01/23 0439 04/02/23 0432 04/03/23 0325 04/04/23 0443  WBC 34.4* 30.1* 27.0* 21.1* 18.0*  HGB 10.3* 10.1* 9.9* 9.5* 10.1*  HCT 31.6* 31.5* 30.4* 29.2* 30.3*  MCV 66.8* 67.9* 68.9* 68.5* 69.0*  PLT 261 207 183 171 149*   Cardiac Enzymes: No results for input(s): "CKTOTAL", "CKMB", "CKMBINDEX", "TROPONINI" in the last 168  hours. CBG: Recent Labs  Lab 04/03/23 2128 04/03/23 2338 04/04/23 0411 04/04/23 0846 04/04/23 1214  GLUCAP 480* 589* 179* 358* 192*    Iron Studies: No results for input(s): "IRON", "TIBC", "TRANSFERRIN", "FERRITIN" in the last 72 hours. Studies/Results: No results found.  arformoterol  15 mcg Nebulization BID   carvedilol  12.5 mg Oral BID WC   Chlorhexidine Gluconate Cloth  6 each Topical Daily   famotidine  10 mg Oral Daily   feeding supplement  237 mL Oral BID BM   ferrous sulfate  325 mg Oral Q breakfast   heparin  5,000 Units Subcutaneous Q8H   insulin aspart  0-20 Units Subcutaneous TID WC   insulin aspart  0-5 Units Subcutaneous QHS   insulin aspart  10 Units Subcutaneous TID WC   insulin glargine-yfgn  28 Units Subcutaneous BID   predniSONE  20 mg Oral Q lunch   revefenacin  175 mcg Nebulization Daily   sodium chloride flush  3 mL Intravenous Q12H    BMET    Component Value Date/Time   NA 137 04/04/2023 0443   K 3.7 04/04/2023 0443  CL 99 04/04/2023 0443   CO2 20 (L) 04/04/2023 0443   GLUCOSE 164 (H) 04/04/2023 0443   BUN 84 (H) 04/04/2023 0443   CREATININE 3.61 (H) 04/04/2023 0443   CALCIUM 8.1 (L) 04/04/2023 0443   GFRNONAA 20 (L) 04/04/2023 0443   GFRAA >60 09/14/2019 0415   CBC    Component Value Date/Time   WBC 18.0 (H) 04/04/2023 0443   RBC 4.39 04/04/2023 0443   HGB 10.1 (L) 04/04/2023 0443   HCT 30.3 (L) 04/04/2023 0443   PLT 149 (L) 04/04/2023 0443   MCV 69.0 (L) 04/04/2023 0443   MCH 23.0 (L) 04/04/2023 0443   MCHC 33.3 04/04/2023 0443   RDW 18.4 (H) 04/04/2023 0443   LYMPHSABS 4.1 (H) 03/28/2023 0404   MONOABS 2.3 (H) 03/28/2023 0404   EOSABS 0.0 03/28/2023 0404   BASOSABS 0.0 03/28/2023 0404    A Renal failure of unclear chronicity, nonoliguric Question of GN because of problem #2 Serologies negative Urine without strong suggestion of nephritic sediment S/p renal biopsy with IR today, 11/5.  Will follow-up with the biopsy  results. The paper kidney biopsy requisition form is in the paper chart Has been responsive to Lasix as needed -- great UOP past 24h w/o diuretics I think very unlikely to have active glomerular disease Azotemia multifactorial including low GFR, high-protein enteral feeds, corticosteroids; slowly improving No RRT indicaitons AHRF with bronchoscopic findings of DAH, serologies as above. Completed pulse Solu-Medrol currently on prednisone 40 mg daily He is off oxygen, satting well on room air. Completed antibiotics for pneumonia Follow up the biopsy results. Hypokalemia, improved; continue repletion as necessary Metabolic acidosis, stable DM2, DKA at admission, improved Leukocytosis, improving, likely multifactorial Anemia   P Cont to hold diuresis today and see how much urine he makes No indications for dialysis, BUN/Cr slowly improving.  Medication Issues; Preferred narcotic agents for pain control are hydromorphone, fentanyl, and methadone. Morphine should not be used.  Baclofen should be avoided Avoid oral sodium phosphate and magnesium citrate based laxatives / bowel preps   Laretta Bolster, M.D.  Internal Medicine Resident, PGY-1 [facility]  Pager: [phone] 2:31 PM, 04/04/2023   **Please contact the on call pager after 5 pm and on weekends at [phone].**

## 2023-04-04 NOTE — Plan of Care (Signed)
  Problem: Clinical Measurements: Goal: Diagnostic test results will improve Outcome: Progressing Goal: Signs and symptoms of infection will decrease Outcome: Progressing   Problem: Education: Goal: Knowledge of General Education information will improve Description: Including pain rating scale, medication(s)/side effects and non-pharmacologic comfort measures Outcome: Progressing   Problem: Health Behavior/Discharge Planning: Goal: Ability to manage health-related needs will improve Outcome: Progressing

## 2023-04-04 NOTE — Progress Notes (Signed)
PROGRESS NOTE    Gary Conway  BJY:[id number] DOB: 05/22/1976 DOA: 03/22/2023 PCP: Pcp, No     Brief Narrative:   47 year old with history of DM 2, spinal cord angioma s/p resection 2015, obesity presented to the ED on 10/25 with 1 week of fever and malaise prior to admission.  Patient was found to have DKA and AKI with signs of sepsis and significant other metabolic abnormalities.  Procalcitonin was ordered 41 and chest x-ray showing significant infiltrate bilaterally.  Initially due to concerns of ARDS development, he was intubated on 10/27 and eventually extubated on 10/30.  In the meantime patient received antibiotics, nephrology team has been following.  There has been concerns of pulmonary renal syndrome therefore currently on steroids.   Subjective: Patient in bed, appears comfortable, denies any headache, no fever, no chest pain or pressure, no shortness of breath , no abdominal pain. No new focal weakness.   Assessment & Plan:   Acute respiratory failure secondary to ARDS/DAH, Severe sepsis secondary to multifocal community-acquired pneumonia suspicion for pulmonary renal syndrome -Initially intubated on 10/27 and extubated 10/30.  Bronchoscopy status post BAL (10/27). Seen by pulmonary finished his IV Zosyn, initially on IV steroids now on oral prednisone, case discussed with pulmonary on 11/28/2022 and 04/01/2023.  Oxygen requirements coming down considerably after he was aggressively diuresed, encouraged to sit in chair use I-S and flutter valve for pulmonary toiletry and monitor.  Clinically much better.  At rest now on room air since 04/03/2023.   Acute kidney injury on CKD stage V Uremia -Creatinine 2021 was 1.1.  Nephrology on board patient underwent CT-guided renal biopsy on 04/02/2023, pathology results pending, responded well to high-dose Lasix, Lasix discontinued on 04/03/2023 by nephrology.  Continue to monitor renal function..     Essential hypertension -Norvasc, Coreg.   IV as needed   Microcytic anemia -Low iron saturations.  Will place him on supplements.   Severe leukocytosis.  Peripheral smear noted with as expected left shift, leukocytosis likely due to #1 above from pneumonia, no diarrhea, continue to monitor CBC along with inflammatory markers.  Gradually improving with supportive care.  Diabetic ketoacidosis History of diabetes mellitus type 2 - DKA is now resolved.  Sugars are difficult to control as patient is still on steroids.  Insulin regimen adjusted for better control.  Continue to monitor.  Lab Results  Component Value Date   HGBA1C 8.1 (H) 03/22/2023   CBG (last 3)  Recent Labs    04/03/23 2128 04/03/23 2338 04/04/23 0411  GLUCAP 480* 589* 179*     DVT prophylaxis: heparin injection 5,000 Units Start: 03/25/23 0600 Code Status: Full code Family Communication: Patient's mother Donnella Sham [phone]  on 04/01/2023 Status is: Inpatient Remains inpatient appropriate because: Continue hospital stay as patient still significantly hypoxic.      Examination:   Awake Alert, No new F.N deficits, Normal affect Ollie.AT,PERRAL Supple Neck, No JVD,   Symmetrical Chest wall movement, Good air movement bilaterally, CTAB RRR,No Gallops, Rubs or new Murmurs,  +ve B.Sounds, Abd Soft, No tenderness,   No Cyanosis, Clubbing or edema     Diet Orders (From admission, onward)     Start     Ordered   04/02/23 1200  Diet heart healthy/carb modified Room service appropriate? Yes; Fluid consistency: Thin  Diet effective now       Question Answer Comment  Diet-HS Snack? Nothing   Room service appropriate? Yes   Fluid consistency: Thin      04/02/23 1200  Objective: Vitals:   04/04/23 0400 04/04/23 0401 04/04/23 0500 04/04/23 0749  BP: 126/89     Pulse: (!) 106 100    Resp: (!) 24 20    Temp: 98.3 F (36.8 C)     TempSrc: Oral     SpO2: 95% 95%  92%  Weight:   77 kg   Height:        Intake/Output Summary (Last 24  hours) at 04/04/2023 0829 Last data filed at 04/04/2023 0600 Gross per 24 hour  Intake 1200 ml  Output 2500 ml  Net -1300 ml   Filed Weights   04/01/23 0500 04/02/23 0500 04/04/23 0500  Weight: 81.2 kg 81.2 kg 77 kg    Scheduled Meds:  arformoterol  15 mcg Nebulization BID   carvedilol  12.5 mg Oral BID WC   Chlorhexidine Gluconate Cloth  6 each Topical Daily   famotidine  10 mg Oral Daily   ferrous sulfate  325 mg Oral Q breakfast   heparin  5,000 Units Subcutaneous Q8H   insulin aspart  0-20 Units Subcutaneous TID WC   insulin aspart  0-5 Units Subcutaneous QHS   insulin aspart  10 Units Subcutaneous TID WC   insulin glargine-yfgn  28 Units Subcutaneous BID   predniSONE  20 mg Oral Q lunch   revefenacin  175 mcg Nebulization Daily   sodium chloride flush  3 mL Intravenous Q12H   Continuous Infusions:  furosemide 120 mg (04/03/23 1946)    Nutritional status Signs/Symptoms: NPO status Interventions: Refer to RD note for recommendations Body mass index is 25.81 kg/m.  Data Reviewed:   Recent Labs  Lab 03/31/23 0259 04/01/23 0439 04/02/23 0432 04/03/23 0325 04/04/23 0443  WBC 34.4* 30.1* 27.0* 21.1* 18.0*  HGB 10.3* 10.1* 9.9* 9.5* 10.1*  HCT 31.6* 31.5* 30.4* 29.2* 30.3*  PLT 261 207 183 171 149*  MCV 66.8* 67.9* 68.9* 68.5* 69.0*  MCH 21.8* 21.8* 22.4* 22.3* 23.0*  MCHC 32.6 32.1 32.6 32.5 33.3  RDW 17.9* 18.5* 17.9* 17.9* 18.4*    Recent Labs  Lab 03/28/23 1000 03/28/23 1008 03/28/23 1547 03/29/23 0331 03/29/23 0752 03/30/23 0442 03/30/23 0748 03/30/23 1024 03/31/23 0259 04/01/23 0439 04/02/23 0432 04/03/23 0325 04/04/23 0443  NA 136   < > 134* 136  --  136  --   --  137 138 133* 134* 137  K 3.3*   < > 3.5 4.2  --  4.3  --   --  4.2 3.5 3.4* 3.8 3.7  CL 105  --  103 106  --  104  --   --  101 101 98 99 99  CO2 16*  --  17* 16*  --  18*  --   --  21* 23 21* 22 20*  ANIONGAP 15  --  14 14  --  14  --   --  15 14 14 13  18*  GLUCOSE 247*  --   146* 181*  --  177*  --   --  265* 97 130* 246* 164*  BUN 130*  --  131* 128*  --  119*  --   --  111* 100* 92* 90* 84*  CREATININE 4.51*  --  4.52* 4.35*  --  4.13*  --   --  4.33* 3.88* 3.60* 3.66* 3.61*  ALBUMIN 1.7*  --  1.8* 1.8*  --   --   --   --   --   --   --   --   --  CRP  --   --   --   --   --   --  6.4*  --  5.4* 2.8* 1.9* 1.5*  --   PROCALCITON  --   --   --   --  5.71  --   --  2.80 2.19  --  0.99  --   --   INR  --   --   --   --   --   --   --   --   --  1.1  --   --   --   BNP  --   --   --   --   --  58.7  --   --   --   --   --   --   --   MG 2.4  --   --   --   --  2.2  --   --  2.3 2.1 1.9 2.1 2.1  CALCIUM 7.4*  --  7.7* 7.6*  --  7.6*  --   --  7.6* 7.8* 7.5* 7.8* 8.1*   < > = values in this interval not displayed.    Lab Results  Component Value Date   TRIG 229 (H) 03/25/2023      Recent Labs  Lab 03/29/23 0752 03/30/23 0442 03/30/23 0748 03/30/23 1024 03/31/23 0259 04/01/23 0439 04/02/23 0432 04/03/23 0325 04/04/23 0443  CRP  --   --  6.4*  --  5.4* 2.8* 1.9* 1.5*  --   PROCALCITON 5.71  --   --  2.80 2.19  --  0.99  --   --   INR  --   --   --   --   --  1.1  --   --   --   BNP  --  58.7  --   --   --   --   --   --   --   MG  --  2.2  --   --  2.3 2.1 1.9 2.1 2.1  CALCIUM  --  7.6*  --   --  7.6* 7.8* 7.5* 7.8* 8.1*    No results found for this or any previous visit (from the past 240 hour(s)).   Radiology Studies:  CT RENAL BIOPSY  Result Date: 04/02/2023 INDICATION: [id number] Renal insufficiency 200588 EXAM: CT-GUIDED NON TARGETED RENAL BIOPSY COMPARISON:  CT CAP, 03/31/2023 MEDICATIONS: None. ANESTHESIA/SEDATION: Moderate (conscious) sedation was employed during this procedure. A total of Versed 1 mg and Fentanyl 50 mcg was administered intravenously. Moderate Sedation Time: 23 minutes. The patient's level of consciousness and vital signs were monitored continuously by radiology nursing throughout the procedure under my direct supervision.  CONTRAST:  None. COMPLICATIONS: None immediate. PROCEDURE: RADIATION DOSE REDUCTION: This exam was performed according to the departmental dose-optimization program which includes automated exposure control, adjustment of the mA and/or kV according to patient size and/or use of iterative reconstruction technique. Informed consent was obtained from the patient following an explanation of the procedure, risks, benefits and alternatives. A time out was performed prior to the initiation of the procedure. The patient was positioned prone on the CT table and a limited CT was performed for procedural planning demonstrating the lower pole the LEFT kidney. The procedure was planned. The operative site was prepped and draped in the usual sterile fashion. Appropriate trajectory was confirmed with a 22 gauge spinal needle after the adjacent tissues were anesthetized with 1% Lidocaine with epinephrine.  Under intermittent CT guidance, a 17 gauge coaxial needle was advanced into the peripheral aspect of the LEFT inferior renal pole. Appropriate positioning was confirmed and 4 samples were obtained with an 18 gauge core needle biopsy device. The co-axial needle was removed and hemostasis was achieved with manual compression. A limited postprocedural CT was negative for hemorrhage or additional complication. A dressing was placed. The patient tolerated the procedure well without immediate postprocedural complication. IMPRESSION: Successful CT guided non-targeted LEFT renal core biopsy. Roanna Banning, MD [facility] Radiology Electronically Signed   By: Roanna Banning M.D.   On: 04/02/2023 15:32     LOS: 13 days   Signature  -    Susa Raring M.D on 04/04/2023 at 8:29 AM   -  To page go to [email or web address]

## 2023-04-04 NOTE — Progress Notes (Signed)
Mobility Specialist Progress Note;   04/04/23 1040  Mobility  Activity Ambulated with assistance in hallway  Level of Assistance Standby assist, set-up cues, supervision of patient - no hands on  Assistive Device Front wheel walker  Distance Ambulated (ft) 250 ft  Activity Response Tolerated well  Mobility Referral Yes  $Mobility charge 1 Mobility  Mobility Specialist Start Time (ACUTE ONLY) 1040  Mobility Specialist Stop Time (ACUTE ONLY) 1100  Mobility Specialist Time Calculation (min) (ACUTE ONLY) 20 min   Pt agreeable to mobility. Required no physical assistance during ambulation, SV. Pt on RA upon arrival. Ambulated on RA this session, VSS and asx throughout. C/o feeling gassy. Pt left in chair with all needs met.   Caesar Bookman Mobility Specialist Please contact via SecureChat or Rehab Office [phone]

## 2023-04-04 NOTE — TOC Initial Note (Signed)
Transition of Care [facility]) - Initial/Assessment Note    Patient Details  Name: Gary Conway MRN: [id number] Date of Birth: 24-Jan-1976  Transition of Care [facility]) CM/SW Contact:    Lawerance Sabal, RN Phone Number: 04/04/2023, 3:05 PM  Clinical Narrative:                  Unable to reach patient by phone.  Patient is uninsured, walking 250 feet. Set up with outpatient PT, added to AVS. Resource for PCP added to AVS  RW requested to be brought to ro through [employer].  TOC will continue to follow  Expected Discharge Plan: Home/Self Care Barriers to Discharge: Continued Medical Work up   Patient Goals and CMS Choice Patient states their goals for this hospitalization and ongoing recovery are:: to go home          Expected Discharge Plan and Services   Discharge Planning Services: CM Consult   Living arrangements for the past 2 months: Apartment                 DME Arranged: Dan Humphreys DME Agency: [employer] Date DME Agency Contacted: 04/04/23 Time DME Agency Contacted: 1505 Representative spoke with at DME Agency: Vaughan Basta            Prior Living Arrangements/Services Living arrangements for the past 2 months: Apartment                     Activities of Daily Living   ADL Screening (condition at time of admission) Independently performs ADLs?: Yes (appropriate for developmental age) Is the patient deaf or have difficulty hearing?: No Does the patient have difficulty seeing, even when wearing glasses/contacts?: No Does the patient have difficulty concentrating, remembering, or making decisions?: No  Permission Sought/Granted                  Emotional Assessment              Admission diagnosis:  Severe sepsis (HCC) [A41.9, R65.20] Fever, unspecified fever cause [R50.9] Uncontrolled type 2 diabetes mellitus with hyperglycemia (HCC) [E11.65] Community acquired pneumonia, unspecified laterality [J18.9] Patient Active Problem List    Diagnosis Date Noted   Diffuse 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   Acute respiratory failure with hypoxia (HCC) 04/02/2023   Acute hypoxemic respiratory failure (HCC) 03/25/2023   Severe sepsis (HCC) 03/22/2023   CAP (community acquired pneumonia) 03/22/2023   Diabetic ketoacidosis without coma associated with type 2 diabetes mellitus (HCC) 09/13/2019   Lactic acidosis 09/13/2019   Benign neoplasm of spinal cord (HCC) 03/27/2014   Obesity (BMI 30-39.9) 03/24/2014   DM (diabetes mellitus) (HCC) 03/24/2014   Spinal hemangioma 03/23/2014   Leukocytosis 03/23/2014   Paraplegia (HCC) 03/22/2014   Thoracic myelopathy 03/22/2014   Numbness and tingling of both legs 03/22/2014   Leg weakness, bilateral 03/22/2014   PCP:  Pcp, No Pharmacy:   [id number] - [place] - [address] [place] ([place] [address] Phone: [phone] Fax: [phone]  [facility] [address] [place] [address] Phone: [phone] Fax: [phone]     Social Determinants of Health (SDOH) Social History: SDOH Screenings   Food Insecurity: No Food Insecurity (03/22/2023)  Housing: Low Risk  (03/22/2023)  Transportation Needs: No Transportation Needs (03/22/2023)  Utilities: Not At Risk (03/22/2023)  Tobacco Use: Low Risk  (03/22/2023)   SDOH Interventions:     Readmission Risk Interventions  No data to display

## 2023-04-05 ENCOUNTER — Encounter (HOSPITAL_COMMUNITY): Payer: Self-pay

## 2023-04-05 ENCOUNTER — Telehealth: Payer: Self-pay | Admitting: Critical Care Medicine

## 2023-04-05 DIAGNOSIS — R042 Hemoptysis: Secondary | ICD-10-CM

## 2023-04-05 LAB — CBC
HCT: 31.1 % — ABNORMAL LOW (ref 39.0–52.0)
Hemoglobin: 10.1 g/dL — ABNORMAL LOW (ref 13.0–17.0)
MCH: 22.7 pg — ABNORMAL LOW (ref 26.0–34.0)
MCHC: 32.5 g/dL (ref 30.0–36.0)
MCV: 69.9 fL — ABNORMAL LOW (ref 80.0–100.0)
Platelets: 146 10*3/uL — ABNORMAL LOW (ref 150–400)
RBC: 4.45 MIL/uL (ref 4.22–5.81)
RDW: 18.8 % — ABNORMAL HIGH (ref 11.5–15.5)
WBC: 18.2 10*3/uL — ABNORMAL HIGH (ref 4.0–10.5)
nRBC: 0 % (ref 0.0–0.2)

## 2023-04-05 LAB — BASIC METABOLIC PANEL
Anion gap: 16 — ABNORMAL HIGH (ref 5–15)
BUN: 81 mg/dL — ABNORMAL HIGH (ref 6–20)
CO2: 22 mmol/L (ref 22–32)
Calcium: 8.2 mg/dL — ABNORMAL LOW (ref 8.9–10.3)
Chloride: 95 mmol/L — ABNORMAL LOW (ref 98–111)
Creatinine, Ser: 3.28 mg/dL — ABNORMAL HIGH (ref 0.61–1.24)
GFR, Estimated: 22 mL/min — ABNORMAL LOW (ref >60)
Glucose, Bld: 281 mg/dL — ABNORMAL HIGH (ref 70–99)
Potassium: 4.6 mmol/L (ref 3.5–5.1)
Sodium: 133 mmol/L — ABNORMAL LOW (ref 135–145)

## 2023-04-05 LAB — GLUCOSE, CAPILLARY
Glucose-Capillary: 214 mg/dL — ABNORMAL HIGH (ref 70–99)
Glucose-Capillary: 173 mg/dL — ABNORMAL HIGH (ref 70–99)
Glucose-Capillary: 234 mg/dL — ABNORMAL HIGH (ref 70–99)
Glucose-Capillary: 340 mg/dL — ABNORMAL HIGH (ref 70–99)
Glucose-Capillary: 308 mg/dL — ABNORMAL HIGH (ref 70–99)

## 2023-04-05 LAB — MAGNESIUM: Magnesium: 2 mg/dL (ref 1.7–2.4)

## 2023-04-05 MED ORDER — FUROSEMIDE 40 MG PO TABS
80.0000 mg | ORAL_TABLET | Freq: Every day | ORAL | Status: DC
Start: 2023-04-06 — End: 2023-04-08
  Administered 2023-04-06 – 2023-04-08 (×3): 80 mg via ORAL
  Filled 2023-04-05 (×3): qty 2

## 2023-04-05 MED ORDER — INSULIN ASPART 100 UNIT/ML IJ SOLN
6.0000 [IU] | Freq: Three times a day (TID) | INTRAMUSCULAR | Status: DC
  Administered 2023-04-05 – 2023-04-08 (×9): 6 [IU] via SUBCUTANEOUS

## 2023-04-05 MED ORDER — PREDNISONE 5 MG PO TABS
10.0000 mg | ORAL_TABLET | Freq: Every day | ORAL | Status: DC
  Administered 2023-04-05 – 2023-04-07 (×3): 10 mg via ORAL
  Filled 2023-04-05 (×3): qty 2

## 2023-04-05 NOTE — Inpatient Diabetes Management (Signed)
Inpatient Diabetes Program Recommendations  AACE/ADA: New Consensus Statement on Inpatient Glycemic Control (2015)  Target Ranges:  Prepandial:   less than 140 mg/dL      Peak postprandial:   less than 180 mg/dL (1-2 hours)      Critically ill patients:  140 - 180 mg/dL   Lab Results  Component Value Date   GLUCAP 173 (H) 04/05/2023   HGBA1C 8.1 (H) 03/22/2023    Review of Glycemic Control  Diabetes history: DM 2 Outpatient Diabetes medications: metformin 500 mg bid Trulicity weekly for 2 months Current orders for Inpatient glycemic control:  Novolog 6 units tid meal coverage Semglee 28 units bid Novolog 0-20 units tid + hs  Ensure Enlive bid between meals (40 grams of carbohydrates) PO prednisone 10 mg Daily   Note: pt here for admission back in 08/2022 was recommended to be placed on 70/30, however, pt was prescribed lantus and humalog could not afford it and did not take the insulin. Pt was prescribed trulicity outpt weekly for 2 months then could not afford it.   No PCP, Does not have good insurance coverage per pt has the general for medical insurance.   Consult for insulin education. Note, pt on steroids here. A1c 8.1% on 10/25  Due to basal bolus needs and cost, I recommend again, pt will need [employer] Novolin 70/30 insulin bid at time of discharge.    Spoke with pt at bedside and reviewed operation of insulin pen. Pt has plans to see a PCP that some of his family goes to.   Thanks,  Christena Deem RN, MSN, BC-ADM Inpatient Diabetes Coordinator Team Pager [phone] (8a-5p)

## 2023-04-05 NOTE — Progress Notes (Addendum)
Patient ID: Gary Conway, male   DOB: 12/10/1975, 47 y.o.   MRN: [id number]  S: He continues to be off oxygen, saturating well.  Tolerated PT/OT this morning.   O:BP 107/61   Pulse (!) 106   Temp 98.7 F (37.1 C) (Oral)   Resp (!) 23   Ht 5\' 8"  (1.727 m)   Wt 78.1 kg   SpO2 97%   BMI 26.18 kg/m   Intake/Output Summary (Last 24 hours) at 04/05/2023 0952 Last data filed at 04/05/2023 0344 Gross per 24 hour  Intake --  Output 2075 ml  Net -2075 ml   Intake/Output: I/O last 3 completed shifts: In: -  Out: 4750 [Urine:4750]  Intake/Output this shift:  No intake/output data recorded. Weight change: 1.1 kg Gen: NAD CVS: RRR Resp:CTA Abd: +BS, soft, NT/ND Ext: no edema  Recent Labs  Lab 03/30/23 0442 03/31/23 0259 04/01/23 0439 04/02/23 0432 04/03/23 0325 04/04/23 0443 04/05/23 0428  NA 136 137 138 133* 134* 137 133*  K 4.3 4.2 3.5 3.4* 3.8 3.7 4.6  CL 104 101 101 98 99 99 95*  CO2 18* 21* 23 21* 22 20* 22  GLUCOSE 177* 265* 97 130* 246* 164* 281*  BUN 119* 111* 100* 92* 90* 84* 81*  CREATININE 4.13* 4.33* 3.88* 3.60* 3.66* 3.61* 3.28*  CALCIUM 7.6* 7.6* 7.8* 7.5* 7.8* 8.1* 8.2*  PHOS 7.3*  --   --   --   --   --   --    Liver Function Tests: No results for input(s): "AST", "ALT", "ALKPHOS", "BILITOT", "PROT", "ALBUMIN" in the last 168 hours.  No results for input(s): "LIPASE", "AMYLASE" in the last 168 hours. No results for input(s): "AMMONIA" in the last 168 hours. CBC: Recent Labs  Lab 04/01/23 0439 04/02/23 0432 04/03/23 0325 04/04/23 0443 04/05/23 0428  WBC 30.1* 27.0* 21.1* 18.0* 18.2*  HGB 10.1* 9.9* 9.5* 10.1* 10.1*  HCT 31.5* 30.4* 29.2* 30.3* 31.1*  MCV 67.9* 68.9* 68.5* 69.0* 69.9*  PLT 207 183 171 149* 146*   Cardiac Enzymes: No results for input(s): "CKTOTAL", "CKMB", "CKMBINDEX", "TROPONINI" in the last 168 hours. CBG: Recent Labs  Lab 04/04/23 0846 04/04/23 1214 04/04/23 1637 04/04/23 2119 04/05/23 0847  GLUCAP 358* 192* 221*  291* 214*    Iron Studies: No results for input(s): "IRON", "TIBC", "TRANSFERRIN", "FERRITIN" in the last 72 hours. Studies/Results: No results found.  arformoterol  15 mcg Nebulization BID   carvedilol  12.5 mg Oral BID WC   Chlorhexidine Gluconate Cloth  6 each Topical Daily   famotidine  10 mg Oral Daily   feeding supplement  237 mL Oral BID BM   ferrous sulfate  325 mg Oral Q breakfast   heparin  5,000 Units Subcutaneous Q8H   insulin aspart  0-20 Units Subcutaneous TID WC   insulin aspart  0-5 Units Subcutaneous QHS   insulin aspart  6 Units Subcutaneous TID WC   insulin glargine-yfgn  28 Units Subcutaneous BID   predniSONE  10 mg Oral Q lunch   revefenacin  175 mcg Nebulization Daily   sodium chloride flush  3 mL Intravenous Q12H    BMET    Component Value Date/Time   NA 133 (L) 04/05/2023 0428   K 4.6 04/05/2023 0428   CL 95 (L) 04/05/2023 0428   CO2 22 04/05/2023 0428   GLUCOSE 281 (H) 04/05/2023 0428   BUN 81 (H) 04/05/2023 0428   CREATININE 3.28 (H) 04/05/2023 0428   CALCIUM 8.2 (L) 04/05/2023 7829  GFRNONAA 22 (L) 04/05/2023 0428   GFRAA >60 09/14/2019 0415   CBC    Component Value Date/Time   WBC 18.2 (H) 04/05/2023 0428   RBC 4.45 04/05/2023 0428   HGB 10.1 (L) 04/05/2023 0428   HCT 31.1 (L) 04/05/2023 0428   PLT 146 (L) 04/05/2023 0428   MCV 69.9 (L) 04/05/2023 0428   MCH 22.7 (L) 04/05/2023 0428   MCHC 32.5 04/05/2023 0428   RDW 18.8 (H) 04/05/2023 0428   LYMPHSABS 4.1 (H) 03/28/2023 0404   MONOABS 2.3 (H) 03/28/2023 0404   EOSABS 0.0 03/28/2023 0404   BASOSABS 0.0 03/28/2023 0404    A Renal failure of unclear chronicity, nonoliguric Question of GN because of problem #2 Serologies negative Urine without strong suggestion of nephritic sediment CT-guided renal biopsy was not a good sample, results not diagnostic.  The paper kidney biopsy requisition form is in the paper chart Will decrease IV Lasix to 80 mg. I think very unlikely to have  active glomerular disease Azotemia multifactorial including low GFR, high-protein enteral feeds, corticosteroids; slowly improving No RRT indicaitons AHRF with bronchoscopic findings of DAH, serologies as above. Completed pulse Solu-Medrol currently on prednisone 40 mg daily He is off oxygen, satting well on room air. Completed antibiotics for pneumonia Follow up the biopsy results. Hypokalemia, improved; continue repletion as necessary Metabolic acidosis, stable DM2, DKA at admission, improved Leukocytosis, improving, likely multifactorial Anemia   P Cont to hold diuresis today and see how much urine he makes No indications for dialysis, BUN/Cr slowly improving.  Medication Issues; Preferred narcotic agents for pain control are hydromorphone, fentanyl, and methadone. Morphine should not be used.  Baclofen should be avoided Avoid oral sodium phosphate and magnesium citrate based laxatives / bowel preps   Laretta Bolster, M.D.  Internal Medicine Resident, PGY-1 [facility]  Pager: [phone] 9:52 AM, 04/05/2023   **Please contact the on call pager after 5 pm and on weekends at [phone].**

## 2023-04-05 NOTE — Progress Notes (Signed)
Physical Therapy Treatment Patient Details Name: Gary Conway MRN: [id number] DOB: 10-Mar-1976 Today's Date: 04/05/2023   History of Present Illness Pt is a 47 y/o male presenting to the ED on 10 25 with 1 week of fevers and malaise and was admitted for DKA and severe AKI.  Imaging found bil opacities and a small right pleual effusion, so treated for bil PNA  Intubated 10/27,  extubated 10/30.  PMHx:  DMII, hemangioma of spine resected with laminectomy T2-4.    PT Comments  Pt received in supine and agreeable to session. Pt able to tolerate increased gait distance this session with SpO2 mostly remaining >90% on RA. Pt demonstrating slight instability initially, however improves with increased distance. Pt noted to drag feet some despite cues, however possibly due to pt declining to tie shoes. Pt able to perform x5 serial STS without UE support with no LOB, however demonstrates slightly reduced eccentric control during sitting. Pt continues to benefit from PT services to progress toward functional mobility goals.    If plan is discharge home, recommend the following: A little help with walking and/or transfers;A little help with bathing/dressing/bathroom;Assistance with cooking/housework;Assist for transportation;Help with stairs or ramp for entrance   Can travel by private vehicle        Equipment Recommendations  Rolling walker (2 wheels)    Recommendations for Other Services       Precautions / Restrictions Precautions Precautions: None Restrictions Weight Bearing Restrictions: No     Mobility  Bed Mobility Overal bed mobility: Modified Independent             General bed mobility comments: increased time    Transfers Overall transfer level: Needs assistance Equipment used: Rolling walker (2 wheels), None Transfers: Sit to/from Stand Sit to Stand: Supervision           General transfer comment: from low EOB with and without UE support     Ambulation/Gait Ambulation/Gait assistance: Supervision Gait Distance (Feet): 400 Feet Assistive device: Rolling walker (2 wheels) Gait Pattern/deviations: Step-through pattern, Trunk flexed       General Gait Details: Pt demonstrating flexed trunk and decreased foot clearance, possibly due to loose shoes however pt declining to tie them       Balance Overall balance assessment: Needs assistance Sitting-balance support: No upper extremity supported, Feet supported Sitting balance-Leahy Scale: Good Sitting balance - Comments: sitting EOB   Standing balance support: Bilateral upper extremity supported, Reliant on assistive device for balance, During functional activity Standing balance-Leahy Scale: Fair Standing balance comment: with RW support                            Cognition Arousal: Alert Behavior During Therapy: Flat affect Overall Cognitive Status: No family/caregiver present to determine baseline cognitive functioning                                 General Comments: slightly impulsive with mobility        Exercises Other Exercises Other Exercises: serial STS x5 without UE support    General Comments General comments (skin integrity, edema, etc.): SpO2 >94% on RA upon entry and remaining mostly WFL during ambulation. One drop to 85% noted, however quickly recovering without rest break      Pertinent Vitals/Pain Pain Assessment Pain Assessment: No/denies pain     PT Goals (current goals can now be found in the  care plan section) Acute Rehab PT Goals Patient Stated Goal: feel better and get out of here independent PT Goal Formulation: With patient Time For Goal Achievement: 04/10/23 Progress towards PT goals: Progressing toward goals    Frequency    Min 1X/week       AM-PAC PT "6 Clicks" Mobility   Outcome Measure  Help needed turning from your back to your side while in a flat bed without using bedrails?: None Help  needed moving from lying on your back to sitting on the side of a flat bed without using bedrails?: None Help needed moving to and from a bed to a chair (including a wheelchair)?: A Little Help needed standing up from a chair using your arms (e.g., wheelchair or bedside chair)?: A Little Help needed to walk in hospital room?: A Little Help needed climbing 3-5 steps with a railing? : A Little 6 Click Score: 20    End of Session Equipment Utilized During Treatment: Gait belt Activity Tolerance: Patient tolerated treatment well Patient left: in bed;with call bell/phone within reach;with family/visitor present Nurse Communication: Mobility status PT Visit Diagnosis: Other abnormalities of gait and mobility (R26.89)     Time: 0912-0927 PT Time Calculation (min) (ACUTE ONLY): 15 min  Charges:    $Gait Training: 8-22 mins PT General Charges $$ ACUTE PT VISIT: 1 Visit                     Gary Conway, PTA Acute Rehabilitation Services Secure Chat Preferred  [phone]    Gary Conway 04/05/2023, 9:38 AM

## 2023-04-05 NOTE — Telephone Encounter (Signed)
OP clinic follow up requested in about 2 weeks.   Steffanie Dunn, DO 04/05/23 2:51 PM [facility]  For contact information, see Amion. If no response to pager, please call PCCM consult pager. After hours, 7PM- 7AM, please call Elink.

## 2023-04-05 NOTE — Progress Notes (Signed)
Mobility Specialist Progress Note;   04/05/23 1445  Mobility  Activity Ambulated with assistance in hallway  Level of Assistance Standby assist, set-up cues, supervision of patient - no hands on  Assistive Device Front wheel walker  Distance Ambulated (ft) 300 ft  Activity Response Tolerated well  Mobility Referral Yes  $Mobility charge 1 Mobility  Mobility Specialist Start Time (ACUTE ONLY) 1445  Mobility Specialist Stop Time (ACUTE ONLY) 1500  Mobility Specialist Time Calculation (min) (ACUTE ONLY) 15 min   Pt agreeable to mobility. Pt in a declined mood d/t what seemed like some news he received from his doctor. Required no physical assistance during ambulation, SV. Ambulated on RA, VSS. No c/o during session. Pt back in bed with all needs met.   Caesar Bookman Mobility Specialist Please contact via SecureChat or Rehab Office [phone]

## 2023-04-05 NOTE — Plan of Care (Signed)
  Problem: Fluid Volume: Goal: Hemodynamic stability will improve Outcome: Progressing   Problem: Clinical Measurements: Goal: Signs and symptoms of infection will decrease Outcome: Progressing   Problem: Respiratory: Goal: Ability to maintain adequate ventilation will improve Outcome: Progressing   Problem: Skin Integrity: Goal: Risk for impaired skin integrity will decrease Outcome: Progressing   Problem: Health Behavior/Discharge Planning: Goal: Ability to identify and utilize available resources and services will improve Outcome: Progressing

## 2023-04-05 NOTE — Plan of Care (Signed)
Problem: Fluid Volume: Goal: Hemodynamic stability will improve Outcome: Progressing   Problem: Clinical Measurements: Goal: Diagnostic test results will improve Outcome: Progressing Goal: Signs and symptoms of infection will decrease Outcome: Progressing   Problem: Respiratory: Goal: Ability to maintain adequate ventilation will improve Outcome: Progressing   Problem: Education: Goal: Ability to describe self-care measures that may prevent or decrease complications (Diabetes Survival Skills Education) will improve Outcome: Progressing Goal: Individualized Educational Video(s) Outcome: Progressing   Problem: Coping: Goal: Ability to adjust to condition or change in health will improve Outcome: Progressing   Problem: Fluid Volume: Goal: Ability to maintain a balanced intake and output will improve Outcome: Progressing   Problem: Health Behavior/Discharge Planning: Goal: Ability to identify and utilize available resources and services will improve Outcome: Progressing Goal: Ability to manage health-related needs will improve Outcome: Progressing   Problem: Metabolic: Goal: Ability to maintain appropriate glucose levels will improve Outcome: Progressing   Problem: Nutritional: Goal: Maintenance of adequate nutrition will improve Outcome: Progressing Goal: Progress toward achieving an optimal weight will improve Outcome: Progressing   Problem: Skin Integrity: Goal: Risk for impaired skin integrity will decrease Outcome: Progressing   Problem: Tissue Perfusion: Goal: Adequacy of tissue perfusion will improve Outcome: Progressing   Problem: Education: Goal: Ability to describe self-care measures that may prevent or decrease complications (Diabetes Survival Skills Education) will improve Outcome: Progressing Goal: Individualized Educational Video(s) Outcome: Progressing   Problem: Cardiac: Goal: Ability to maintain an adequate cardiac output will  improve Outcome: Progressing   Problem: Health Behavior/Discharge Planning: Goal: Ability to identify and utilize available resources and services will improve Outcome: Progressing Goal: Ability to manage health-related needs will improve Outcome: Progressing   Problem: Fluid Volume: Goal: Ability to achieve a balanced intake and output will improve Outcome: Progressing   Problem: Metabolic: Goal: Ability to maintain appropriate glucose levels will improve Outcome: Progressing   Problem: Nutritional: Goal: Maintenance of adequate nutrition will improve Outcome: Progressing Goal: Maintenance of adequate weight for body size and type will improve Outcome: Progressing   Problem: Respiratory: Goal: Will regain and/or maintain adequate ventilation Outcome: Progressing   Problem: Urinary Elimination: Goal: Ability to achieve and maintain adequate renal perfusion and functioning will improve Outcome: Progressing   Problem: Education: Goal: Knowledge of General Education information will improve Description: Including pain rating scale, medication(s)/side effects and non-pharmacologic comfort measures Outcome: Progressing   Problem: Health Behavior/Discharge Planning: Goal: Ability to manage health-related needs will improve Outcome: Progressing   Problem: Clinical Measurements: Goal: Ability to maintain clinical measurements within normal limits will improve Outcome: Progressing Goal: Will remain free from infection Outcome: Progressing Goal: Diagnostic test results will improve Outcome: Progressing Goal: Respiratory complications will improve Outcome: Progressing Goal: Cardiovascular complication will be avoided Outcome: Progressing   Problem: Activity: Goal: Risk for activity intolerance will decrease Outcome: Progressing   Problem: Nutrition: Goal: Adequate nutrition will be maintained Outcome: Progressing   Problem: Coping: Goal: Level of anxiety will  decrease Outcome: Progressing   Problem: Elimination: Goal: Will not experience complications related to bowel motility Outcome: Progressing Goal: Will not experience complications related to urinary retention Outcome: Progressing   Problem: Pain Management: Goal: General experience of comfort will improve Outcome: Progressing   Problem: Safety: Goal: Ability to remain free from injury will improve Outcome: Progressing   Problem: Skin Integrity: Goal: Risk for impaired skin integrity will decrease Outcome: Progressing   Problem: Activity: Goal: Ability to tolerate increased activity will improve Outcome: Progressing   Problem: Respiratory: Goal: Ability to  maintain a clear airway and adequate ventilation will improve Outcome: Progressing   Problem: Role Relationship: Goal: Method of communication will improve Outcome: Progressing

## 2023-04-05 NOTE — Progress Notes (Signed)
   NAME:  Gary Conway, MRN:  [id number], DOB:  February 01, 1976, LOS: 14 ADMISSION DATE:  03/22/2023, CONSULTATION DATE:  03/24/2023 REFERRING MD:  Caleb Popp, CHIEF COMPLAINT:  Acute hypoxemic respiratory failure   History of Present Illness:  47 year old male with PMH of T2DM, spinal cord angioma s/p resection 2015, and obesity who presented to the ED on 10/25 with 1 week of fevers and malaise and was admitted for DKA and severe AKI.  Presented febrile, tachycardic, hypertensive, tachypneic, and hypoxic responsive to 5 L nasal cannula with labs showing hyponatremia, anion gap metabolic acidosis with a gap of 24, creatinine 5.65 from a baseline of 1.1 (2021), leukocytosis of 24, hemoglobin 10.9, lactic acid 4.4, Pro-Cal 141.  He was found to have bilateral opacities chest x-ray and small right pleural effusion.  Treated for DKA and community-acquired pneumonia.  On 10/27 respiratory status worsened and patient was intubated and transferred to the ICU.  Pertinent  Medical History  T2DM Spinal Hemangioma s/p excision 2015  Significant Hospital Events: Including procedures, antibiotic start and stop dates in addition to other pertinent events   10/25: Admitted with DKA, AKI, bilateral pneumonia w/ possible developing ARDS 10/27: Intubated for worsening respiratory status, bronchoscopy w/ alveolitis, POCUS TTE w/ normal RV function, renal ultrasound WNL.  Antibiotics broadened and pulsed dose steroids added 10/28: passing SBTs 10/30: extubated  Interim History / Subjective:  Renal biopsy nondiagnostic. Still on RA.    Objective   Blood pressure 107/61, pulse (!) 106, temperature 98.7 F (37.1 C), temperature source Oral, resp. rate (!) 23, height 5\' 8"  (1.727 m), weight 78.1 kg, SpO2 97%.        Intake/Output Summary (Last 24 hours) at 04/05/2023 1452 Last data filed at 04/05/2023 0344 Gross per 24 hour  Intake --  Output 2075 ml  Net -2075 ml   Filed Weights   04/02/23 0500 04/04/23 0500  04/05/23 0500  Weight: 81.2 kg 77 kg 78.1 kg    Examination: Constitutional: lying in bed in NAD watching TV HENT: [place]/AT, eyes anicteric Cardio: S1S2, RRR pulm: CTAB, breathing comfortably on RA, no conversational dyspnea Abdomen: soft, NT MSK: no edema or cyanosis Skin: warm, dry, no rashes Neuro: awake, alert, moving all extremities  CRP 2.8> 1.9  Labs reviewed.    Resolved Hospital Problem list   DKA Severe sepsis  Assessment & Plan:   Acute hypoxemic respiratory failure- resolved Diffuse alveolar hemorrhage; seronegative, question if this could have been due to hypervolemia vs inflammatory cause.  ARDS-resolved -taper off steroids and monitor closely; if he has recurrence of hemoptysis would repeat serologies and ESR, CRP -Without seropositivity or biopsy results and a potential alternative explanation (acute pulmonary edema) for hemoptysis, would not expose him to the risk of prolonged immunosuppression. Taper off steroids over the next 7-10 days. Needs OP follow up in a few weeks to ensure his symptoms have not recurred.  -Appreciate nephro's management of AKI  Type 2 diabetes Hypertension Anemia, likely due to pulmonary hemorrhage, prolonged illness -per primary  D/w Dr. Thedore Mins. PCCM will be available as needed. Please call with questions.    Steffanie Dunn, DO 04/05/23 2:56 PM [facility]  For contact information, see Amion. If no response to pager, please call PCCM consult pager. After hours, 7PM- 7AM, please call Elink.

## 2023-04-05 NOTE — Progress Notes (Addendum)
PROGRESS NOTE    Gary Conway  ZOX:[id number] DOB: 02/27/1976 DOA: 03/22/2023 PCP: Pcp, No     Brief Narrative:   47 year old with history of DM 2, spinal cord angioma s/p resection 2015, obesity presented to the ED on 10/25 with 1 week of fever and malaise prior to admission.  Patient was found to have DKA and AKI with signs of sepsis and significant other metabolic abnormalities.  Procalcitonin was ordered 41 and chest x-ray showing significant infiltrate bilaterally.  Initially due to concerns of ARDS development, he was intubated on 10/27 and eventually extubated on 10/30.  In the meantime patient received antibiotics, nephrology team has been following.  There has been concerns of pulmonary renal syndrome therefore currently on steroids.   Subjective: Patient in bed, appears comfortable, denies any headache, no fever, no chest pain or pressure, no shortness of breath , no abdominal pain. No new focal weakness.   Assessment & Plan:   Acute respiratory failure secondary to ARDS/DAH, Severe sepsis secondary to multifocal community-acquired pneumonia suspicion for pulmonary renal syndrome -Initially intubated on 10/27 and extubated 10/30.  Bronchoscopy status post BAL (10/27). Seen by pulmonary finished his IV Zosyn, initially on IV steroids now on oral prednisone, rapidly taper off over the next 7 to 10 days starting 04/05/2023, Dr. Chestine Spore again on 04/05/2023, with aggressive diuresis his oxygen requirements have completely resolved and he symptom-free on room air, continue to encourage him to use I-S flutter valve for pulmonary toiletry and sit in chair in daytime.  I question if his pulmonary hemorrhage was due to CHF.  Pulmonary will see him in the office in 2 weeks postdischarge, continue diuresis per nephrology, will require outpatient cardiology follow-up as well.    Acute kidney injury on CKD stage V Uremia -Creatinine 2021 was 1.1.  Nephrology on board patient underwent CT-guided  renal biopsy on 04/02/2023, fortunately it was a poor sample and was nondiagnostic, responded well to high-dose Lasix, now down to room air, nephrology following.  Renal ultrasound was nonacute.  Discussed with nephrologist Dr. Arrie Aran and Peoples on 04/05/2023.   Acute on chronic diastolic CHF EF 60%.  Kindly see #1 above.  Outpatient cardiology follow-up, on Coreg and diuretics.  Essential hypertension -Norvasc, Coreg.  IV as needed   Microcytic anemia -Low iron saturations.  Will place him on supplements.   Severe leukocytosis.  Peripheral smear noted with as expected left shift, leukocytosis likely due to #1 above from pneumonia, no diarrhea, continue to monitor CBC along with inflammatory markers.  Gradually improving with supportive care.  CT chest abdomen pelvis nonacute except for possible multifocal pneumonia.  Diabetic ketoacidosis History of diabetes mellitus type 2 - DKA is now resolved.  Sugars are difficult to control as patient is still on steroids.  Insulin regimen adjusted for better control.  Continue to monitor.  Lab Results  Component Value Date   HGBA1C 8.1 (H) 03/22/2023   CBG (last 3)  Recent Labs    04/04/23 1214 04/04/23 1637 04/04/23 2119  GLUCAP 192* 221* 291*     DVT prophylaxis: heparin injection 5,000 Units Start: 03/25/23 0600 Code Status: Full code Family Communication: Patient's mother Donnella Sham [phone]  on 04/01/2023, 04/05/23 Status is: Inpatient Remains inpatient appropriate because: Continue hospital stay as patient still significantly hypoxic.      Examination:   Awake Alert, No new F.N deficits, Normal affect [place].AT,PERRAL Supple Neck, No JVD,   Symmetrical Chest wall movement, Good air movement bilaterally, CTAB RRR,No Gallops, Rubs or new Murmurs,  +  ve B.Sounds, Abd Soft, No tenderness,   No Cyanosis, Clubbing or edema     Diet Orders (From admission, onward)     Start     Ordered   04/02/23 1200  Diet heart healthy/carb  modified Room service appropriate? Yes; Fluid consistency: Thin  Diet effective now       Question Answer Comment  Diet-HS Snack? Nothing   Room service appropriate? Yes   Fluid consistency: Thin      04/02/23 1200            Objective: Vitals:   04/04/23 2024 04/04/23 2036 04/05/23 0000 04/05/23 0500  BP:  107/63 107/61   Pulse: (!) 109 (!) 106    Resp: (!) 25  (!) 23   Temp:  98.7 F (37.1 C)    TempSrc:  Oral    SpO2: 93% 97%    Weight:    78.1 kg  Height:        Intake/Output Summary (Last 24 hours) at 04/05/2023 0803 Last data filed at 04/05/2023 0344 Gross per 24 hour  Intake --  Output 2475 ml  Net -2475 ml   Filed Weights   04/02/23 0500 04/04/23 0500 04/05/23 0500  Weight: 81.2 kg 77 kg 78.1 kg    Scheduled Meds:  arformoterol  15 mcg Nebulization BID   carvedilol  12.5 mg Oral BID WC   Chlorhexidine Gluconate Cloth  6 each Topical Daily   famotidine  10 mg Oral Daily   feeding supplement  237 mL Oral BID BM   ferrous sulfate  325 mg Oral Q breakfast   heparin  5,000 Units Subcutaneous Q8H   insulin aspart  0-20 Units Subcutaneous TID WC   insulin aspart  0-5 Units Subcutaneous QHS   insulin aspart  6 Units Subcutaneous TID WC   insulin glargine-yfgn  28 Units Subcutaneous BID   predniSONE  10 mg Oral Q lunch   revefenacin  175 mcg Nebulization Daily   sodium chloride flush  3 mL Intravenous Q12H   Continuous Infusions:  furosemide 120 mg (04/04/23 2208)    Nutritional status Signs/Symptoms: estimated needs Interventions: MVI, Snacks Body mass index is 26.18 kg/m.  Data Reviewed:   Recent Labs  Lab 04/01/23 0439 04/02/23 0432 04/03/23 0325 04/04/23 0443 04/05/23 0428  WBC 30.1* 27.0* 21.1* 18.0* 18.2*  HGB 10.1* 9.9* 9.5* 10.1* 10.1*  HCT 31.5* 30.4* 29.2* 30.3* 31.1*  PLT 207 183 171 149* 146*  MCV 67.9* 68.9* 68.5* 69.0* 69.9*  MCH 21.8* 22.4* 22.3* 23.0* 22.7*  MCHC 32.1 32.6 32.5 33.3 32.5  RDW 18.5* 17.9* 17.9* 18.4* 18.8*     Recent Labs  Lab 03/30/23 0442 03/30/23 0748 03/30/23 1024 03/31/23 0259 04/01/23 0439 04/02/23 0432 04/03/23 0325 04/04/23 0443 04/05/23 0428  NA 136  --   --  137 138 133* 134* 137 133*  K 4.3  --   --  4.2 3.5 3.4* 3.8 3.7 4.6  CL 104  --   --  101 101 98 99 99 95*  CO2 18*  --   --  21* 23 21* 22 20* 22  ANIONGAP 14  --   --  15 14 14 13  18* 16*  GLUCOSE 177*  --   --  265* 97 130* 246* 164* 281*  BUN 119*  --   --  111* 100* 92* 90* 84* 81*  CREATININE 4.13*  --   --  4.33* 3.88* 3.60* 3.66* 3.61* 3.28*  CRP  --  6.4*  --  5.4* 2.8* 1.9* 1.5*  --   --   PROCALCITON  --   --  2.80 2.19  --  0.99  --   --   --   INR  --   --   --   --  1.1  --   --   --   --   BNP 58.7  --   --   --   --   --   --   --   --   MG 2.2  --   --  2.3 2.1 1.9 2.1 2.1 2.0  CALCIUM 7.6*  --   --  7.6* 7.8* 7.5* 7.8* 8.1* 8.2*    Lab Results  Component Value Date   TRIG 229 (H) 03/25/2023      Recent Labs  Lab 03/30/23 0442 03/30/23 0748 03/30/23 1024 03/31/23 0259 04/01/23 0439 04/02/23 0432 04/03/23 0325 04/04/23 0443 04/05/23 0428  CRP  --  6.4*  --  5.4* 2.8* 1.9* 1.5*  --   --   PROCALCITON  --   --  2.80 2.19  --  0.99  --   --   --   INR  --   --   --   --  1.1  --   --   --   --   BNP 58.7  --   --   --   --   --   --   --   --   MG 2.2  --   --  2.3 2.1 1.9 2.1 2.1 2.0  CALCIUM 7.6*  --   --  7.6* 7.8* 7.5* 7.8* 8.1* 8.2*    No results found for this or any previous visit (from the past 240 hour(s)).   Radiology Studies:  No results found.   LOS: 14 days   Signature  -    Susa Raring M.D on 04/05/2023 at 8:03 AM   -  To page go to [email or web address]

## 2023-04-06 LAB — RENAL FUNCTION PANEL
Albumin: 2.6 g/dL — ABNORMAL LOW (ref 3.5–5.0)
Anion gap: 12 (ref 5–15)
BUN: 79 mg/dL — ABNORMAL HIGH (ref 6–20)
CO2: 24 mmol/L (ref 22–32)
Calcium: 8.6 mg/dL — ABNORMAL LOW (ref 8.9–10.3)
Chloride: 97 mmol/L — ABNORMAL LOW (ref 98–111)
Creatinine, Ser: 3.21 mg/dL — ABNORMAL HIGH (ref 0.61–1.24)
GFR, Estimated: 23 mL/min — ABNORMAL LOW (ref >60)
Glucose, Bld: 150 mg/dL — ABNORMAL HIGH (ref 70–99)
Phosphorus: 6 mg/dL — ABNORMAL HIGH (ref 2.5–4.6)
Potassium: 3.9 mmol/L (ref 3.5–5.1)
Sodium: 133 mmol/L — ABNORMAL LOW (ref 135–145)

## 2023-04-06 LAB — GLUCOSE, CAPILLARY
Glucose-Capillary: 139 mg/dL — ABNORMAL HIGH (ref 70–99)
Glucose-Capillary: 292 mg/dL — ABNORMAL HIGH (ref 70–99)
Glucose-Capillary: 222 mg/dL — ABNORMAL HIGH (ref 70–99)
Glucose-Capillary: 233 mg/dL — ABNORMAL HIGH (ref 70–99)

## 2023-04-06 NOTE — Progress Notes (Addendum)
PROGRESS NOTE    Gary Conway  ZOX:[id number] DOB: 31-May-1975 DOA: 03/22/2023 PCP: Pcp, No     Brief Narrative:   47 year old with history of DM 2, spinal cord angioma s/p resection 2015, obesity presented to the ED on 10/25 with 1 week of fever and malaise prior to admission.  Patient was found to have DKA and AKI with signs of sepsis and significant other metabolic abnormalities.  Procalcitonin was ordered 41 and chest x-ray showing significant infiltrate bilaterally.  Initially due to concerns of ARDS development, he was intubated on 10/27 and eventually extubated on 10/30.  In the meantime patient received antibiotics, nephrology team has been following.  There has been concerns of pulmonary renal syndrome therefore currently on steroids.   Subjective: Patient in bed, appears comfortable, denies any headache, no fever, no chest pain or pressure, no shortness of breath , no abdominal pain. No new focal weakness.    Assessment & Plan:   Acute respiratory failure secondary to ARDS/DAH, Severe sepsis secondary to multifocal community-acquired pneumonia suspicion for pulmonary renal syndrome -Initially intubated on 10/27 and extubated 10/30.  Bronchoscopy status post BAL (10/27). Seen by pulmonary finished his IV Zosyn, initially on IV steroids now on oral prednisone, rapidly taper off over the next 7 to 10 days starting 04/05/2023, Dr. Chestine Spore again on 04/05/2023, with aggressive diuresis his oxygen requirements have completely resolved and he symptom-free on room air, continue to encourage him to use I-S flutter valve for pulmonary toiletry and sit in chair in daytime.  I question if his pulmonary hemorrhage was due to CHF.  Pulmonary will see him in the office in 2 weeks postdischarge, continue diuresis per nephrology, will require outpatient cardiology follow-up as well.    Acute kidney injury on CKD stage V Uremia -Creatinine 2021 was 1.1.  Nephrology on board patient underwent CT-guided  renal biopsy on 04/02/2023, fortunately it was a poor sample and was nondiagnostic, responded well to high-dose Lasix, now down to room air, nephrology following.  Renal ultrasound was nonacute.  Discussed with nephrologist Dr. Arrie Aran and Peoples on 04/05/2023.   Acute on chronic diastolic CHF EF 60%.  Kindly see #1 above.  Outpatient cardiology follow-up, on Coreg and diuretics.  Essential hypertension -Norvasc, Coreg.  IV as needed   Microcytic anemia -Low iron saturations.  Will place him on supplements.   Severe leukocytosis.  Peripheral smear noted with as expected left shift, leukocytosis likely due to #1 above from pneumonia, no diarrhea, continue to monitor CBC along with inflammatory markers.  Gradually improving with supportive care.  CT chest abdomen pelvis nonacute except for possible multifocal pneumonia.  Diabetic ketoacidosis History of diabetes mellitus type 2 - DKA is now resolved.  Sugars are difficult to control as patient is still on steroids.  Insulin regimen adjusted for better control.  Continue to monitor.  Lab Results  Component Value Date   HGBA1C 8.1 (H) 03/22/2023   CBG (last 3)  Recent Labs    04/05/23 1832 04/05/23 2033 04/06/23 0843  GLUCAP 340* 308* 139*     DVT prophylaxis: heparin injection 5,000 Units Start: 03/25/23 0600 Code Status: Full code Family Communication: Patient's mother Donnella Sham [phone]  on 04/01/2023, 04/05/23 Status is: Inpatient Remains inpatient appropriate because: Continue hospital stay as patient still significantly hypoxic.      Examination:   Awake Alert, No new F.N deficits, Normal affect [place].AT,PERRAL Supple Neck, No JVD,   Symmetrical Chest wall movement, Good air movement bilaterally, CTAB RRR,No Gallops, Rubs or new  Murmurs,  +ve B.Sounds, Abd Soft, No tenderness,   No Cyanosis, Clubbing or edema     Diet Orders (From admission, onward)     Start     Ordered   04/02/23 1200  Diet heart healthy/carb  modified Room service appropriate? Yes; Fluid consistency: Thin  Diet effective now       Question Answer Comment  Diet-HS Snack? Nothing   Room service appropriate? Yes   Fluid consistency: Thin      04/02/23 1200            Objective: Vitals:   04/05/23 2329 04/06/23 0705 04/06/23 0754 04/06/23 0755  BP: 124/84     Pulse: (!) 109  (!) 103   Resp: 19  18   Temp: 98 F (36.7 C)     TempSrc:      SpO2:   97% 97%  Weight:  77.9 kg    Height:        Intake/Output Summary (Last 24 hours) at 04/06/2023 1106 Last data filed at 04/06/2023 0715 Gross per 24 hour  Intake --  Output 1200 ml  Net -1200 ml   Filed Weights   04/04/23 0500 04/05/23 0500 04/06/23 0705  Weight: 77 kg 78.1 kg 77.9 kg    Scheduled Meds:  arformoterol  15 mcg Nebulization BID   carvedilol  12.5 mg Oral BID WC   Chlorhexidine Gluconate Cloth  6 each Topical Daily   famotidine  10 mg Oral Daily   feeding supplement  237 mL Oral BID BM   ferrous sulfate  325 mg Oral Q breakfast   furosemide  80 mg Oral Daily   heparin  5,000 Units Subcutaneous Q8H   insulin aspart  0-20 Units Subcutaneous TID WC   insulin aspart  0-5 Units Subcutaneous QHS   insulin aspart  6 Units Subcutaneous TID WC   insulin glargine-yfgn  28 Units Subcutaneous BID   predniSONE  10 mg Oral Q lunch   revefenacin  175 mcg Nebulization Daily   sodium chloride flush  3 mL Intravenous Q12H   Continuous Infusions:    Nutritional status Signs/Symptoms: estimated needs Interventions: MVI, Snacks Body mass index is 26.11 kg/m.  Data Reviewed:   Recent Labs  Lab 04/01/23 0439 04/02/23 0432 04/03/23 0325 04/04/23 0443 04/05/23 0428  WBC 30.1* 27.0* 21.1* 18.0* 18.2*  HGB 10.1* 9.9* 9.5* 10.1* 10.1*  HCT 31.5* 30.4* 29.2* 30.3* 31.1*  PLT 207 183 171 149* 146*  MCV 67.9* 68.9* 68.5* 69.0* 69.9*  MCH 21.8* 22.4* 22.3* 23.0* 22.7*  MCHC 32.1 32.6 32.5 33.3 32.5  RDW 18.5* 17.9* 17.9* 18.4* 18.8*    Recent Labs  Lab  03/31/23 0259 04/01/23 0439 04/02/23 0432 04/03/23 0325 04/04/23 0443 04/05/23 0428 04/06/23 0740  NA 137 138 133* 134* 137 133* 133*  K 4.2 3.5 3.4* 3.8 3.7 4.6 3.9  CL 101 101 98 99 99 95* 97*  CO2 21* 23 21* 22 20* 22 24  ANIONGAP 15 14 14 13  18* 16* 12  GLUCOSE 265* 97 130* 246* 164* 281* 150*  BUN 111* 100* 92* 90* 84* 81* 79*  CREATININE 4.33* 3.88* 3.60* 3.66* 3.61* 3.28* 3.21*  ALBUMIN  --   --   --   --   --   --  2.6*  CRP 5.4* 2.8* 1.9* 1.5*  --   --   --   PROCALCITON 2.19  --  0.99  --   --   --   --  INR  --  1.1  --   --   --   --   --   MG 2.3 2.1 1.9 2.1 2.1 2.0  --   CALCIUM 7.6* 7.8* 7.5* 7.8* 8.1* 8.2* 8.6*    Lab Results  Component Value Date   TRIG 229 (H) 03/25/2023      Recent Labs  Lab 03/31/23 0259 04/01/23 0439 04/02/23 0432 04/03/23 0325 04/04/23 0443 04/05/23 0428 04/06/23 0740  CRP 5.4* 2.8* 1.9* 1.5*  --   --   --   PROCALCITON 2.19  --  0.99  --   --   --   --   INR  --  1.1  --   --   --   --   --   MG 2.3 2.1 1.9 2.1 2.1 2.0  --   CALCIUM 7.6* 7.8* 7.5* 7.8* 8.1* 8.2* 8.6*    No results found for this or any previous visit (from the past 240 hour(s)).   Radiology Studies:  No results found.   LOS: 15 days   Signature  -    Susa Raring M.D on 04/06/2023 at 11:06 AM   -  To page go to [email or web address]

## 2023-04-06 NOTE — Progress Notes (Signed)
Patient ID: Gary Conway, male   DOB: 09/27/1975, 47 y.o.   MRN: [id number]  S: Continues to feel well with no complaints   O:BP 124/84   Pulse (!) 103   Temp 98 F (36.7 C)   Resp 18   Ht 5\' 8"  (1.727 m)   Wt 77.9 kg   SpO2 97%   BMI 26.11 kg/m   Intake/Output Summary (Last 24 hours) at 04/06/2023 5366 Last data filed at 04/06/2023 0715 Gross per 24 hour  Intake --  Output 1200 ml  Net -1200 ml   Intake/Output: I/O last 3 completed shifts: In: -  Out: 1850 [Urine:1850]  Intake/Output this shift:  Total I/O In: -  Out: 800 [Urine:800] Weight change:  Gen: NAD, lying in bed CVS: RRR, no rub Resp: Bilateral chest rise with no increased work of breathing Abd: +BS, soft, NT/ND Ext: no edema  Recent Labs  Lab 03/31/23 0259 04/01/23 0439 04/02/23 0432 04/03/23 0325 04/04/23 0443 04/05/23 0428 04/06/23 0740  NA 137 138 133* 134* 137 133* 133*  K 4.2 3.5 3.4* 3.8 3.7 4.6 3.9  CL 101 101 98 99 99 95* 97*  CO2 21* 23 21* 22 20* 22 24  GLUCOSE 265* 97 130* 246* 164* 281* 150*  BUN 111* 100* 92* 90* 84* 81* 79*  CREATININE 4.33* 3.88* 3.60* 3.66* 3.61* 3.28* 3.21*  ALBUMIN  --   --   --   --   --   --  2.6*  CALCIUM 7.6* 7.8* 7.5* 7.8* 8.1* 8.2* 8.6*  PHOS  --   --   --   --   --   --  6.0*   Liver Function Tests: Recent Labs  Lab 04/06/23 0740  ALBUMIN 2.6*    No results for input(s): "LIPASE", "AMYLASE" in the last 168 hours. No results for input(s): "AMMONIA" in the last 168 hours. CBC: Recent Labs  Lab 04/01/23 0439 04/02/23 0432 04/03/23 0325 04/04/23 0443 04/05/23 0428  WBC 30.1* 27.0* 21.1* 18.0* 18.2*  HGB 10.1* 9.9* 9.5* 10.1* 10.1*  HCT 31.5* 30.4* 29.2* 30.3* 31.1*  MCV 67.9* 68.9* 68.5* 69.0* 69.9*  PLT 207 183 171 149* 146*   Cardiac Enzymes: No results for input(s): "CKTOTAL", "CKMB", "CKMBINDEX", "TROPONINI" in the last 168 hours. CBG: Recent Labs  Lab 04/05/23 1201 04/05/23 1629 04/05/23 1832 04/05/23 2033 04/06/23 0843   GLUCAP 173* 234* 340* 308* 139*    Iron Studies: No results for input(s): "IRON", "TIBC", "TRANSFERRIN", "FERRITIN" in the last 72 hours. Studies/Results: No results found.  arformoterol  15 mcg Nebulization BID   carvedilol  12.5 mg Oral BID WC   Chlorhexidine Gluconate Cloth  6 each Topical Daily   famotidine  10 mg Oral Daily   feeding supplement  237 mL Oral BID BM   ferrous sulfate  325 mg Oral Q breakfast   furosemide  80 mg Oral Daily   heparin  5,000 Units Subcutaneous Q8H   insulin aspart  0-20 Units Subcutaneous TID WC   insulin aspart  0-5 Units Subcutaneous QHS   insulin aspart  6 Units Subcutaneous TID WC   insulin glargine-yfgn  28 Units Subcutaneous BID   predniSONE  10 mg Oral Q lunch   revefenacin  175 mcg Nebulization Daily   sodium chloride flush  3 mL Intravenous Q12H    BMET    Component Value Date/Time   NA 133 (L) 04/06/2023 0740   K 3.9 04/06/2023 0740   CL 97 (L) 04/06/2023 0740  CO2 24 04/06/2023 0740   GLUCOSE 150 (H) 04/06/2023 0740   BUN 79 (H) 04/06/2023 0740   CREATININE 3.21 (H) 04/06/2023 0740   CALCIUM 8.6 (L) 04/06/2023 0740   GFRNONAA 23 (L) 04/06/2023 0740   GFRAA >60 09/14/2019 0415   CBC    Component Value Date/Time   WBC 18.2 (H) 04/05/2023 0428   RBC 4.45 04/05/2023 0428   HGB 10.1 (L) 04/05/2023 0428   HCT 31.1 (L) 04/05/2023 0428   PLT 146 (L) 04/05/2023 0428   MCV 69.9 (L) 04/05/2023 0428   MCH 22.7 (L) 04/05/2023 0428   MCHC 32.5 04/05/2023 0428   RDW 18.8 (H) 04/05/2023 0428   LYMPHSABS 4.1 (H) 03/28/2023 0404   MONOABS 2.3 (H) 03/28/2023 0404   EOSABS 0.0 03/28/2023 0404   BASOSABS 0.0 03/28/2023 0404    A Renal failure of unclear chronicity, nonoliguric Question of GN because of problem #2 Serologies negative Urine without strong suggestion of nephritic sediment CT-guided renal biopsy was not a good sample, results not diagnostic with only medulla Oral Lasix 80 mg daily I think very unlikely to have  active glomerular disease Azotemia multifactorial including low GFR, high-protein enteral feeds, corticosteroids; slowly improving No RRT indicaitons AHRF with bronchoscopic findings of DAH, serologies as above. Completed pulse Solu-Medrol currently on prednisone 40 mg daily He is off oxygen, satting well on room air. Completed antibiotics for pneumonia Follow up the biopsy results. Hypokalemia, improved; continue repletion as necessary Metabolic acidosis, stable DM2, DKA at admission, improved Leukocytosis, improving, likely multifactorial Anemia   P Slowly improving.  We will sign off Medication Issues; Preferred narcotic agents for pain control are hydromorphone, fentanyl, and methadone. Morphine should not be used.  Baclofen should be avoided Avoid oral sodium phosphate and magnesium citrate based laxatives / bowel preps   Given the patient's improvement/stability we will sign off at this time.  We will arrange follow-up in our office in 2 weeks.

## 2023-04-06 NOTE — Plan of Care (Signed)
Problem: Fluid Volume: Goal: Hemodynamic stability will improve Outcome: Progressing   Problem: Clinical Measurements: Goal: Diagnostic test results will improve Outcome: Progressing Goal: Signs and symptoms of infection will decrease Outcome: Progressing   Problem: Respiratory: Goal: Ability to maintain adequate ventilation will improve Outcome: Progressing   Problem: Education: Goal: Ability to describe self-care measures that may prevent or decrease complications (Diabetes Survival Skills Education) will improve Outcome: Progressing Goal: Individualized Educational Video(s) Outcome: Progressing   Problem: Coping: Goal: Ability to adjust to condition or change in health will improve Outcome: Progressing   Problem: Fluid Volume: Goal: Ability to maintain a balanced intake and output will improve Outcome: Progressing   Problem: Health Behavior/Discharge Planning: Goal: Ability to identify and utilize available resources and services will improve Outcome: Progressing Goal: Ability to manage health-related needs will improve Outcome: Progressing   Problem: Metabolic: Goal: Ability to maintain appropriate glucose levels will improve Outcome: Progressing   Problem: Nutritional: Goal: Maintenance of adequate nutrition will improve Outcome: Progressing Goal: Progress toward achieving an optimal weight will improve Outcome: Progressing   Problem: Skin Integrity: Goal: Risk for impaired skin integrity will decrease Outcome: Progressing   Problem: Tissue Perfusion: Goal: Adequacy of tissue perfusion will improve Outcome: Progressing   Problem: Education: Goal: Ability to describe self-care measures that may prevent or decrease complications (Diabetes Survival Skills Education) will improve Outcome: Progressing Goal: Individualized Educational Video(s) Outcome: Progressing   Problem: Cardiac: Goal: Ability to maintain an adequate cardiac output will  improve Outcome: Progressing   Problem: Health Behavior/Discharge Planning: Goal: Ability to identify and utilize available resources and services will improve Outcome: Progressing Goal: Ability to manage health-related needs will improve Outcome: Progressing   Problem: Fluid Volume: Goal: Ability to achieve a balanced intake and output will improve Outcome: Progressing   Problem: Metabolic: Goal: Ability to maintain appropriate glucose levels will improve Outcome: Progressing   Problem: Nutritional: Goal: Maintenance of adequate nutrition will improve Outcome: Progressing Goal: Maintenance of adequate weight for body size and type will improve Outcome: Progressing   Problem: Respiratory: Goal: Will regain and/or maintain adequate ventilation Outcome: Progressing   Problem: Urinary Elimination: Goal: Ability to achieve and maintain adequate renal perfusion and functioning will improve Outcome: Progressing   Problem: Education: Goal: Knowledge of General Education information will improve Description: Including pain rating scale, medication(s)/side effects and non-pharmacologic comfort measures Outcome: Progressing   Problem: Health Behavior/Discharge Planning: Goal: Ability to manage health-related needs will improve Outcome: Progressing   Problem: Clinical Measurements: Goal: Ability to maintain clinical measurements within normal limits will improve Outcome: Progressing Goal: Will remain free from infection Outcome: Progressing Goal: Diagnostic test results will improve Outcome: Progressing Goal: Respiratory complications will improve Outcome: Progressing Goal: Cardiovascular complication will be avoided Outcome: Progressing   Problem: Activity: Goal: Risk for activity intolerance will decrease Outcome: Progressing   Problem: Nutrition: Goal: Adequate nutrition will be maintained Outcome: Progressing   Problem: Coping: Goal: Level of anxiety will  decrease Outcome: Progressing   Problem: Elimination: Goal: Will not experience complications related to bowel motility Outcome: Progressing Goal: Will not experience complications related to urinary retention Outcome: Progressing   Problem: Pain Management: Goal: General experience of comfort will improve Outcome: Progressing   Problem: Safety: Goal: Ability to remain free from injury will improve Outcome: Progressing   Problem: Skin Integrity: Goal: Risk for impaired skin integrity will decrease Outcome: Progressing   Problem: Activity: Goal: Ability to tolerate increased activity will improve Outcome: Progressing   Problem: Respiratory: Goal: Ability to  maintain a clear airway and adequate ventilation will improve Outcome: Progressing   Problem: Role Relationship: Goal: Method of communication will improve Outcome: Progressing

## 2023-04-07 LAB — GLUCOSE, CAPILLARY
Glucose-Capillary: 246 mg/dL — ABNORMAL HIGH (ref 70–99)
Glucose-Capillary: 260 mg/dL — ABNORMAL HIGH (ref 70–99)
Glucose-Capillary: 290 mg/dL — ABNORMAL HIGH (ref 70–99)
Glucose-Capillary: 252 mg/dL — ABNORMAL HIGH (ref 70–99)
Glucose-Capillary: 222 mg/dL — ABNORMAL HIGH (ref 70–99)

## 2023-04-07 MED ORDER — GLUCERNA SHAKE PO LIQD
237.0000 mL | Freq: Three times a day (TID) | ORAL | Status: DC
  Administered 2023-04-07 – 2023-04-08 (×3): 237 mL via ORAL

## 2023-04-07 NOTE — Progress Notes (Signed)
PROGRESS NOTE    Gary Conway  NWG:[id number] DOB: 1975-12-15 DOA: 03/22/2023 PCP: Pcp, No     Brief Narrative:   47 year old with history of DM 2, spinal cord angioma s/p resection 2015, obesity presented to the ED on 10/25 with 1 week of fever and malaise prior to admission.  Patient was found to have DKA and AKI with signs of sepsis and significant other metabolic abnormalities.  Procalcitonin was ordered 41 and chest x-ray showing significant infiltrate bilaterally.  Initially due to concerns of ARDS development, he was intubated on 10/27 and eventually extubated on 10/30.  In the meantime patient received antibiotics, nephrology team has been following.  There has been concerns of pulmonary renal syndrome therefore currently on steroids.   Subjective: Patient in bed, appears comfortable, denies any headache, no fever, no chest pain or pressure, no shortness of breath , no abdominal pain. No new focal weakness.   Assessment & Plan:   Acute respiratory failure secondary to ARDS , Severe sepsis secondary to multifocal community-acquired pneumonia suspicion for pulmonary renal syndrome -Initially intubated on 10/27 and extubated 10/30.  Bronchoscopy status post BAL (10/27). Seen by pulmonary finished his IV Zosyn, initially on IV steroids now on oral prednisone, rapidly taper off over the next 7 to 10 days starting 04/05/2023, Dr. Chestine Spore again on 04/05/2023, with aggressive diuresis his oxygen requirements have completely resolved and he symptom-free on room air, continue to encourage him to use I-S flutter valve for pulmonary toiletry and sit in chair in daytime.  I question if his pulmonary hemorrhage was due to CHF.  Pulmonary will see him in the office in 2 weeks postdischarge, continue diuresis per nephrology, will require outpatient cardiology follow-up as well.    Acute kidney injury on CKD stage V, Uremia  - Creatinine 2021 was 1.1.  Nephrology on board patient underwent CT-guided renal  biopsy on 04/02/2023, unfortunately it was a poor sample and was nondiagnostic, responded well to high-dose Lasix, now down to room air, nephrology following.  Renal ultrasound was nonacute.  Discussed with nephrologist Dr. Arrie Aran and Peoples on 04/05/2023.   Acute on chronic diastolic CHF EF 60%.  Kindly see #1 above.  Outpatient cardiology follow-up, on Coreg and diuretics.  Essential hypertension  -Norvasc, Coreg.  IV as needed   Microcytic anemia  -Low iron saturations.  Will place him on supplements.   Severe leukocytosis.  Peripheral smear noted with as expected left shift, leukocytosis likely due to #1 above from pneumonia, no diarrhea, continue to monitor CBC along with inflammatory markers.  Gradually improving with supportive care.  CT chest abdomen pelvis nonacute except for possible multifocal pneumonia.  Diabetic ketoacidosis, History of diabetes mellitus type 2  - DKA is now resolved.  Sugars are difficult to control as patient is still on steroids.  Insulin regimen adjusted for better control.  Continue to monitor.  Lab Results  Component Value Date   HGBA1C 8.1 (H) 03/22/2023   CBG (last 3)  Recent Labs    04/06/23 1642 04/06/23 2057 04/07/23 0841  GLUCAP 222* 233* 246*     DVT prophylaxis: heparin injection 5,000 Units Start: 03/25/23 0600 Code Status: Full code Family Communication: Patient's mother Donnella Sham [phone]  on 04/01/2023, 04/05/23 Status is: Inpatient Remains inpatient appropriate because: Continue hospital stay as patient still significantly hypoxic.      Examination:   Awake Alert, No new F.N deficits, Normal affect [place].AT,PERRAL Supple Neck, No JVD,   Symmetrical Chest wall movement, Good air movement bilaterally, CTAB  RRR,No Gallops, Rubs or new Murmurs,  +ve B.Sounds, Abd Soft, No tenderness,   No Cyanosis, Clubbing or edema     Diet Orders (From admission, onward)     Start     Ordered   04/02/23 1200  Diet heart healthy/carb modified  Room service appropriate? Yes; Fluid consistency: Thin  Diet effective now       Question Answer Comment  Diet-HS Snack? Nothing   Room service appropriate? Yes   Fluid consistency: Thin      04/02/23 1200            Objective: Vitals:   04/07/23 0432 04/07/23 0815 04/07/23 0816 04/07/23 0838  BP: 117/83   126/74  Pulse: (!) 108   (!) 107  Resp: 19   18  Temp: 97.7 F (36.5 C)   98.2 F (36.8 C)  TempSrc:    Oral  SpO2: 98%  97%   Weight:  76.2 kg    Height:        Intake/Output Summary (Last 24 hours) at 04/07/2023 0921 Last data filed at 04/06/2023 1800 Gross per 24 hour  Intake --  Output 800 ml  Net -800 ml   Filed Weights   04/05/23 0500 04/06/23 0705 04/07/23 0815  Weight: 78.1 kg 77.9 kg 76.2 kg    Scheduled Meds:  arformoterol  15 mcg Nebulization BID   carvedilol  12.5 mg Oral BID WC   famotidine  10 mg Oral Daily   feeding supplement  237 mL Oral BID BM   ferrous sulfate  325 mg Oral Q breakfast   furosemide  80 mg Oral Daily   heparin  5,000 Units Subcutaneous Q8H   insulin aspart  0-20 Units Subcutaneous TID WC   insulin aspart  0-5 Units Subcutaneous QHS   insulin aspart  6 Units Subcutaneous TID WC   insulin glargine-yfgn  28 Units Subcutaneous BID   predniSONE  10 mg Oral Q lunch   revefenacin  175 mcg Nebulization Daily   sodium chloride flush  3 mL Intravenous Q12H   Continuous Infusions:    Nutritional status Signs/Symptoms: estimated needs Interventions: MVI, Snacks Body mass index is 25.54 kg/m.  Data Reviewed:   Recent Labs  Lab 04/01/23 0439 04/02/23 0432 04/03/23 0325 04/04/23 0443 04/05/23 0428  WBC 30.1* 27.0* 21.1* 18.0* 18.2*  HGB 10.1* 9.9* 9.5* 10.1* 10.1*  HCT 31.5* 30.4* 29.2* 30.3* 31.1*  PLT 207 183 171 149* 146*  MCV 67.9* 68.9* 68.5* 69.0* 69.9*  MCH 21.8* 22.4* 22.3* 23.0* 22.7*  MCHC 32.1 32.6 32.5 33.3 32.5  RDW 18.5* 17.9* 17.9* 18.4* 18.8*    Recent Labs  Lab 04/01/23 0439 04/02/23 0432  04/03/23 0325 04/04/23 0443 04/05/23 0428 04/06/23 0740  NA 138 133* 134* 137 133* 133*  K 3.5 3.4* 3.8 3.7 4.6 3.9  CL 101 98 99 99 95* 97*  CO2 23 21* 22 20* 22 24  ANIONGAP 14 14 13  18* 16* 12  GLUCOSE 97 130* 246* 164* 281* 150*  BUN 100* 92* 90* 84* 81* 79*  CREATININE 3.88* 3.60* 3.66* 3.61* 3.28* 3.21*  ALBUMIN  --   --   --   --   --  2.6*  CRP 2.8* 1.9* 1.5*  --   --   --   PROCALCITON  --  0.99  --   --   --   --   INR 1.1  --   --   --   --   --  MG 2.1 1.9 2.1 2.1 2.0  --   CALCIUM 7.8* 7.5* 7.8* 8.1* 8.2* 8.6*    Lab Results  Component Value Date   TRIG 229 (H) 03/25/2023      Recent Labs  Lab 04/01/23 0439 04/02/23 0432 04/03/23 0325 04/04/23 0443 04/05/23 0428 04/06/23 0740  CRP 2.8* 1.9* 1.5*  --   --   --   PROCALCITON  --  0.99  --   --   --   --   INR 1.1  --   --   --   --   --   MG 2.1 1.9 2.1 2.1 2.0  --   CALCIUM 7.8* 7.5* 7.8* 8.1* 8.2* 8.6*    No results found for this or any previous visit (from the past 240 hour(s)).   Radiology Studies:  No results found.   LOS: 16 days   Signature  -    Susa Raring M.D on 04/07/2023 at 9:21 AM   -  To page go to [email or web address]

## 2023-04-07 NOTE — Plan of Care (Signed)
  Problem: Fluid Volume: Goal: Hemodynamic stability will improve Outcome: Progressing   

## 2023-04-07 NOTE — Plan of Care (Signed)
Problem: Fluid Volume: Goal: Hemodynamic stability will improve Outcome: Progressing   Problem: Clinical Measurements: Goal: Diagnostic test results will improve Outcome: Progressing Goal: Signs and symptoms of infection will decrease Outcome: Progressing   Problem: Respiratory: Goal: Ability to maintain adequate ventilation will improve Outcome: Progressing   Problem: Education: Goal: Ability to describe self-care measures that may prevent or decrease complications (Diabetes Survival Skills Education) will improve Outcome: Progressing Goal: Individualized Educational Video(s) Outcome: Progressing   Problem: Coping: Goal: Ability to adjust to condition or change in health will improve Outcome: Progressing   Problem: Fluid Volume: Goal: Ability to maintain a balanced intake and output will improve Outcome: Progressing   Problem: Health Behavior/Discharge Planning: Goal: Ability to identify and utilize available resources and services will improve Outcome: Progressing Goal: Ability to manage health-related needs will improve Outcome: Progressing   Problem: Metabolic: Goal: Ability to maintain appropriate glucose levels will improve Outcome: Progressing   Problem: Nutritional: Goal: Maintenance of adequate nutrition will improve Outcome: Progressing Goal: Progress toward achieving an optimal weight will improve Outcome: Progressing   Problem: Skin Integrity: Goal: Risk for impaired skin integrity will decrease Outcome: Progressing   Problem: Tissue Perfusion: Goal: Adequacy of tissue perfusion will improve Outcome: Progressing   Problem: Education: Goal: Ability to describe self-care measures that may prevent or decrease complications (Diabetes Survival Skills Education) will improve Outcome: Progressing Goal: Individualized Educational Video(s) Outcome: Progressing   Problem: Cardiac: Goal: Ability to maintain an adequate cardiac output will  improve Outcome: Progressing   Problem: Health Behavior/Discharge Planning: Goal: Ability to identify and utilize available resources and services will improve Outcome: Progressing Goal: Ability to manage health-related needs will improve Outcome: Progressing   Problem: Fluid Volume: Goal: Ability to achieve a balanced intake and output will improve Outcome: Progressing   Problem: Metabolic: Goal: Ability to maintain appropriate glucose levels will improve Outcome: Progressing   Problem: Nutritional: Goal: Maintenance of adequate nutrition will improve Outcome: Progressing Goal: Maintenance of adequate weight for body size and type will improve Outcome: Progressing   Problem: Respiratory: Goal: Will regain and/or maintain adequate ventilation Outcome: Progressing   Problem: Urinary Elimination: Goal: Ability to achieve and maintain adequate renal perfusion and functioning will improve Outcome: Progressing   Problem: Education: Goal: Knowledge of General Education information will improve Description: Including pain rating scale, medication(s)/side effects and non-pharmacologic comfort measures Outcome: Progressing   Problem: Health Behavior/Discharge Planning: Goal: Ability to manage health-related needs will improve Outcome: Progressing   Problem: Clinical Measurements: Goal: Ability to maintain clinical measurements within normal limits will improve Outcome: Progressing Goal: Will remain free from infection Outcome: Progressing Goal: Diagnostic test results will improve Outcome: Progressing Goal: Respiratory complications will improve Outcome: Progressing Goal: Cardiovascular complication will be avoided Outcome: Progressing   Problem: Activity: Goal: Risk for activity intolerance will decrease Outcome: Progressing   Problem: Nutrition: Goal: Adequate nutrition will be maintained Outcome: Progressing   Problem: Coping: Goal: Level of anxiety will  decrease Outcome: Progressing   Problem: Elimination: Goal: Will not experience complications related to bowel motility Outcome: Progressing Goal: Will not experience complications related to urinary retention Outcome: Progressing   Problem: Pain Management: Goal: General experience of comfort will improve Outcome: Progressing   Problem: Safety: Goal: Ability to remain free from injury will improve Outcome: Progressing   Problem: Skin Integrity: Goal: Risk for impaired skin integrity will decrease Outcome: Progressing   Problem: Activity: Goal: Ability to tolerate increased activity will improve Outcome: Progressing   Problem: Respiratory: Goal: Ability to  maintain a clear airway and adequate ventilation will improve Outcome: Progressing   Problem: Role Relationship: Goal: Method of communication will improve Outcome: Progressing

## 2023-04-08 ENCOUNTER — Encounter (HOSPITAL_BASED_OUTPATIENT_CLINIC_OR_DEPARTMENT_OTHER): Payer: Self-pay | Admitting: Pulmonary Disease

## 2023-04-08 ENCOUNTER — Other Ambulatory Visit (HOSPITAL_COMMUNITY): Payer: Self-pay

## 2023-04-08 LAB — CBC WITH DIFFERENTIAL/PLATELET
Abs Immature Granulocytes: 0.04 10*3/uL (ref 0.00–0.07)
Basophils Absolute: 0 10*3/uL (ref 0.0–0.1)
Basophils Relative: 0 %
Eosinophils Absolute: 0.1 10*3/uL (ref 0.0–0.5)
Eosinophils Relative: 1 %
HCT: 26.9 % — ABNORMAL LOW (ref 39.0–52.0)
Hemoglobin: 8.6 g/dL — ABNORMAL LOW (ref 13.0–17.0)
Immature Granulocytes: 1 %
Lymphocytes Relative: 11 %
Lymphs Abs: 0.8 10*3/uL (ref 0.7–4.0)
MCH: 22.8 pg — ABNORMAL LOW (ref 26.0–34.0)
MCHC: 32 g/dL (ref 30.0–36.0)
MCV: 71.2 fL — ABNORMAL LOW (ref 80.0–100.0)
Monocytes Absolute: 0.4 10*3/uL (ref 0.1–1.0)
Monocytes Relative: 5 %
Neutro Abs: 6.1 10*3/uL (ref 1.7–7.7)
Neutrophils Relative %: 82 %
Platelets: 124 10*3/uL — ABNORMAL LOW (ref 150–400)
RBC: 3.78 MIL/uL — ABNORMAL LOW (ref 4.22–5.81)
RDW: 19.9 % — ABNORMAL HIGH (ref 11.5–15.5)
WBC: 7.5 10*3/uL (ref 4.0–10.5)
nRBC: 0 % (ref 0.0–0.2)

## 2023-04-08 LAB — BASIC METABOLIC PANEL
Anion gap: 11 (ref 5–15)
BUN: 67 mg/dL — ABNORMAL HIGH (ref 6–20)
CO2: 23 mmol/L (ref 22–32)
Calcium: 8.6 mg/dL — ABNORMAL LOW (ref 8.9–10.3)
Chloride: 101 mmol/L (ref 98–111)
Creatinine, Ser: 2.61 mg/dL — ABNORMAL HIGH (ref 0.61–1.24)
GFR, Estimated: 29 mL/min — ABNORMAL LOW (ref >60)
Glucose, Bld: 107 mg/dL — ABNORMAL HIGH (ref 70–99)
Potassium: 3.9 mmol/L (ref 3.5–5.1)
Sodium: 135 mmol/L (ref 135–145)

## 2023-04-08 LAB — GLUCOSE, CAPILLARY
Glucose-Capillary: 127 mg/dL — ABNORMAL HIGH (ref 70–99)
Glucose-Capillary: 108 mg/dL — ABNORMAL HIGH (ref 70–99)

## 2023-04-08 MED ORDER — LANCET DEVICE MISC
1.0000 | Freq: Three times a day (TID) | 0 refills | Status: AC
  Filled 2023-04-08: qty 1, 30d supply, fill #0

## 2023-04-08 MED ORDER — ACCU-CHEK SOFTCLIX LANCETS MISC
1.0000 | Freq: Three times a day (TID) | 0 refills | Status: AC
  Filled 2023-04-08: qty 100, 30d supply, fill #0

## 2023-04-08 MED ORDER — INSULIN LISPRO (1 UNIT DIAL) 100 UNIT/ML (KWIKPEN)
PEN_INJECTOR | SUBCUTANEOUS | 0 refills | Status: DC
  Filled 2023-04-08: qty 15, 28d supply, fill #0
  Filled 2023-04-08: qty 12, 28d supply, fill #0

## 2023-04-08 MED ORDER — LANTUS SOLOSTAR 100 UNIT/ML ~~LOC~~ SOPN
35.0000 [IU] | PEN_INJECTOR | Freq: Every day | SUBCUTANEOUS | 0 refills | Status: DC
  Filled 2023-04-08 (×2): qty 3, 8d supply, fill #0

## 2023-04-08 MED ORDER — IPRATROPIUM-ALBUTEROL 0.5-2.5 (3) MG/3ML IN SOLN
RESPIRATORY_TRACT | 0 refills | Status: DC
  Filled 2023-04-08: qty 360, 15d supply, fill #0

## 2023-04-08 MED ORDER — FUROSEMIDE 80 MG PO TABS
80.0000 mg | ORAL_TABLET | Freq: Every day | ORAL | 0 refills | Status: DC
  Filled 2023-04-08: qty 30, 30d supply, fill #0

## 2023-04-08 MED ORDER — CARVEDILOL 12.5 MG PO TABS
12.5000 mg | ORAL_TABLET | Freq: Two times a day (BID) | ORAL | 0 refills | Status: DC
  Filled 2023-04-08: qty 60, 30d supply, fill #0

## 2023-04-08 MED ORDER — BLOOD GLUCOSE MONITOR SYSTEM W/DEVICE KIT
1.0000 | PACK | Freq: Three times a day (TID) | 0 refills | Status: AC
  Filled 2023-04-08: qty 1, 30d supply, fill #0

## 2023-04-08 MED ORDER — PREDNISONE 5 MG PO TABS: 5.0000 mg | ORAL_TABLET | Freq: Every day | ORAL | Status: DC

## 2023-04-08 MED ORDER — BLOOD GLUCOSE TEST VI STRP
1.0000 | ORAL_STRIP | Freq: Three times a day (TID) | 0 refills | Status: AC
  Filled 2023-04-08: qty 100, 34d supply, fill #0
  Filled 2023-04-08: qty 100, 30d supply, fill #0

## 2023-04-08 MED ORDER — INSULIN PEN NEEDLE 32G X 4 MM MISC
0 refills | Status: DC
  Filled 2023-04-08: qty 100, 25d supply, fill #0

## 2023-04-08 MED ORDER — FERROUS SULFATE 325 (65 FE) MG PO TABS
325.0000 mg | ORAL_TABLET | Freq: Every day | ORAL | 0 refills | Status: DC
  Filled 2023-04-08: qty 30, 30d supply, fill #0

## 2023-04-08 NOTE — TOC Transition Note (Signed)
Transition of Care [facility]) - CM/SW Discharge Note   Patient Details  Name: Gary Conway MRN: [id number] Date of Birth: [birth year]  Transition of [facility]) CM/SW Contact:  Gordy Clement, RN Phone Number: 04/08/2023, 9:43 AM   Clinical Narrative:    Patient to DC to home today.  Patient has been set up for OP therapies- AVS updated. Match letter sent to [facility] pharmacy  Family to transport       Barriers to Discharge: Continued Medical Work up   Patient Goals and CMS Choice      Discharge Placement                         Discharge Plan and Services Additional resources added to the After Visit Summary for     Discharge Planning Services: CM Consult            DME Arranged: Dan Humphreys DME Agency: [employer] Date DME Agency Contacted: 04/04/23 Time DME Agency Contacted: [phone] Representative spoke with at DME Agency: Vaughan Basta            Social Determinants of Health (SDOH) Interventions SDOH Screenings   Food Insecurity: No Food Insecurity (03/22/2023)  Housing: Low Risk  (03/22/2023)  Transportation Needs: No Transportation Needs (03/22/2023)  Utilities: Not At Risk (03/22/2023)  Tobacco Use: Low Risk  (03/22/2023)     Readmission Risk Interventions     No data to display

## 2023-04-08 NOTE — Discharge Summary (Signed)
Vandy Baune YNW:[id number] DOB: 07-12-1975 DOA: 03/22/2023  PCP: Pcp, No  Admit date: 03/22/2023  Discharge date: 04/08/2023  Admitted From: Home   Disposition:  Home   Recommendations for Outpatient Follow-up:   Follow up with PCP in 1-2 weeks  PCP Please obtain BMP/CBC, 2 view CXR in 1week,  (see Discharge instructions)   PCP Please follow up on the following pending results:    Home Health: None   Equipment/Devices: Neb machine  Consultations: Renal, IR, PCCM Discharge Condition: Stable     CODE STATUS: Full     Diet Recommendation: Heart Healthy carbohydrate diet with 1.5 L fluid restriction per day.    Chief Complaint  Patient presents with   Fever     Brief history of present illness from the day of admission and additional interim summary    47 year old with history of DM 2, spinal cord angioma s/p resection 2015, obesity presented to the ED on 10/25 with 1 week of fever and malaise prior to admission. Patient was found to have DKA and AKI with signs of sepsis and significant other metabolic abnormalities. Procalcitonin was ordered 41 and chest x-ray showing significant infiltrate bilaterally. Initially due to concerns of ARDS development, he was intubated on 10/27 and eventually extubated on 10/30. In the meantime patient received antibiotics, nephrology team has been following. There has been concerns of pulmonary renal syndrome therefore currently on steroids.                                                                  Hospital Course   Acute respiratory failure secondary to ARDS/DAH, Severe sepsis secondary to multifocal community-acquired pneumonia suspicion for pulmonary renal syndrome -Initially intubated on 10/27 and extubated 10/30.  Bronchoscopy status post BAL (10/27). Seen by  pulmonary finished his IV Zosyn, initially on IV steroids now on oral prednisone, rapidly taper off over the next 7 to 10 days starting 04/05/2023, Dr. Chestine Spore again on 04/05/2023, with aggressive diuresis his oxygen requirements have completely resolved and he symptom-free on room air, continue to encourage him to use I-S flutter valve for pulmonary toiletry and sit in chair in daytime.  I question if his pulmonary hemorrhage was due to CHF.  Pulmonary will see him in the office in 2 weeks postdischarge, continue diuresis per nephrology, will require outpatient cardiology follow-up as well.    After diuresis with high-dose IV Lasix patient has been on room air for 3 days, has been tapered off of steroids today after discussions with Dr. Zoe Lan pulmonary critical care on 04/07/2023, he is relatively symptom-free, question if pulmonary edema was the main source of his symptoms, postdischarge he will follow-up with PCCM and pulmonary.  Being diuresed and 80 mg of Lasix.    Acute kidney injury on CKD stage V  -  Creatinine 2021 was 1.1.  Nephrology on board patient underwent CT-guided renal biopsy on 04/02/2023, fortunately it was a poor sample and was nondiagnostic, responded well to high-dose Lasix, now down to room air, nephrology following.  Renal ultrasound was nonacute.  Discussed with nephrologist Dr. Arrie Aran and Peoples on 04/05/2023.  Will be discharged on 80 mg of Lasix daily with outpatient nephrology and PCP follow-up.  No signs of uremia anymore, renal function serially improving.   Acute on chronic diastolic CHF EF 60%.  Kindly see #1 above.  Outpatient cardiology follow-up, on Coreg and diuretics.  PCP to arrange for cardiology follow-up.   Essential hypertension - Will on present dose Coreg   Microcytic anemia -Low iron saturations.  Will place him on supplements.   Severe leukocytosis.  Peripheral smear noted with as expected left shift, leukocytosis likely due to #1 above from  pneumonia, no diarrhea, continue to monitor CBC along with inflammatory markers.  Gradually improving with supportive care.  CT chest abdomen pelvis nonacute except for possible multifocal pneumonia.  Has finished antibiotic treatment and serially improving.   Diabetic ketoacidosis History of diabetes mellitus type 2 - DKA is now resolved.  Sugars are stable on present regimen, steroids tapered off today.  Provided testing supplies along with 1 month supply of insulin.  Diabetic insulin education provided.    Discharge diagnosis     Principal Problem:   Severe sepsis (HCC) Active Problems:   Obesity (BMI 30-39.9)   DM (diabetes mellitus) (HCC)   Benign neoplasm of spinal cord (HCC)   CAP (community acquired pneumonia)   Acute hypoxemic respiratory failure (HCC)   Diffuse pulmonary alveolar hemorrhage   Acute respiratory failure with hypoxia (HCC)   Hemoptysis    Discharge instructions    Discharge Instructions     Ambulatory referral to Pulmonology   Complete by: As directed    Reason for referral: Asthma/COPD   Discharge instructions   Complete by: As directed    Follow with Primary MD in 7 days   Get CBC, CMP, 2 view Chest X ray -  checked next visit with your primary MD   Activity: As tolerated with Full fall precautions use walker/cane & assistance as needed  Disposition Home    Diet: Heart Healthy low carbohydrate diet, 1.5 L fluid restriction per day.  CBGs q. ACH S.  Accuchecks 4 times/day, Once in AM empty stomach and then before each meal. Log in all results and show them to your Prim.MD in 3 days. If any glucose reading is under 80 or above 300 call your Prim MD immidiately. Follow Low glucose instructions for glucose under 80 as instructed.   Special Instructions: If you have smoked or chewed Tobacco  in the last 2 yrs please stop smoking, stop any regular Alcohol  and or any Recreational drug use.  On your next visit with your primary care physician  please Get Medicines reviewed and adjusted.  Please request your Prim.MD to go over all Hospital Tests and Procedure/Radiological results at the follow up, please get all Hospital records sent to your Prim MD by signing hospital release before you go home.  If you experience worsening of your admission symptoms, develop shortness of breath, life threatening emergency, suicidal or homicidal thoughts you must seek medical attention immediately by calling 911 or calling your MD immediately  if symptoms less severe.  You Must read complete instructions/literature along with all the possible adverse reactions/side effects for all the Medicines you take and that  have been prescribed to you. Take any new Medicines after you have completely understood and accpet all the possible adverse reactions/side effects.   Do not drive when taking Pain medications.  Do not take more than prescribed Pain, Sleep and Anxiety Medications   For home use only DME Nebulizer/meds   Complete by: As directed    Patient needs a nebulizer to treat with the following condition: COPD (chronic obstructive pulmonary disease) (HCC)   Length of Need: 12 Months   Increase activity slowly   Complete by: As directed    No wound care   Complete by: As directed        Discharge Medications   Allergies as of 04/08/2023       Reactions   Other Hives, Itching   Ketchup & Mustard        Medication List     STOP taking these medications    amLODipine 5 MG tablet Commonly known as: NORVASC   metFORMIN 500 MG tablet Commonly known as: GLUCOPHAGE       TAKE these medications    blood glucose meter kit and supplies Kit Dispense based on patient and insurance preference. Use up to four times daily as directed. (FOR ICD-9 250.00, 250.01). For QAC - HS accuchecks.   Blood Glucose Monitoring Suppl Devi 1 each by Does not apply route in the morning, at noon, and at bedtime. May substitute to any manufacturer covered by  patient's insurance.   BLOOD GLUCOSE TEST STRIPS Strp 1 each by In Vitro route in the morning, at noon, and at bedtime. May substitute to any manufacturer covered by patient's insurance.   carvedilol 12.5 MG tablet Commonly known as: COREG Take 1 tablet (12.5 mg total) by mouth 2 (two) times daily with a meal.   ferrous sulfate 325 (65 FE) MG tablet Take 1 tablet (325 mg total) by mouth daily with breakfast.   furosemide 80 MG tablet Commonly known as: LASIX Take 1 tablet (80 mg total) by mouth daily.   insulin aspart 100 UNIT/ML FlexPen Commonly known as: NOVOLOG Before each meal 3 times a day, 140-199 - 2 units, 200-250 - 6 units, 251-299 - 8 units,  300-349 - 10 units,  350 or above 14 units.   insulin glargine-yfgn 100 UNIT/ML Pen Commonly known as: SEMGLEE Inject 35 Units into the skin daily.   Insulin Syringe-Needle U-100 25G X 1" 1 ML Misc For 4 times a day insulin SQ, 1 month supply. Diagnosis E11.65   ipratropium-albuterol 0.5-2.5 (3) MG/3ML Soln Commonly known as: DUONEB Use twice a day scheduled and every 4 hours as needed for shortness of breath and wheezing   Lancet Device Misc 1 each by Does not apply route in the morning, at noon, and at bedtime. May substitute to any manufacturer covered by patient's insurance.   Lancets Misc. Misc 1 each by Does not apply route in the morning, at noon, and at bedtime. May substitute to any manufacturer covered by patient's insurance.               Durable Medical Equipment  (From admission, onward)           Start     Ordered   04/08/23 0000  For home use only DME Nebulizer/meds       Question Answer Comment  Patient needs a nebulizer to treat with the following condition COPD (chronic obstructive pulmonary disease) (HCC)   Length of Need 12 Months      04/08/23  3329             Follow-up Information     [facility] Follow up.   Specialty:  Rehabilitation Why: They will call you to schedule an appoontment for physical therapy Contact information: [address] [place] [address] [phone]        [facility] Follow up.   Why: Call to schedule an appointment to establish with a PCP Contact information: [address] [employer] [address] [place] [address] [phone]        Terrial Rhodes, MD. Schedule an appointment as soon as possible for a visit in 1 week(s).   Specialty: Nephrology Contact information: [address] [place] [address] [phone]         Jake Bathe, MD. Schedule an appointment as soon as possible for a visit in 1 week(s).   Specialty: Cardiology Why: CHF Contact information: [address] [place] [address] [phone]                 Major procedures and Radiology Reports - PLEASE review detailed and final reports thoroughly  -        CT RENAL BIOPSY  Result Date: 04/02/2023 INDICATION: 270350 Renal insufficiency [id number] EXAM: CT-GUIDED NON TARGETED RENAL BIOPSY COMPARISON:  CT CAP, 03/31/2023 MEDICATIONS: None. ANESTHESIA/SEDATION: Moderate (conscious) sedation was employed during this procedure. A total of Versed 1 mg and Fentanyl 50 mcg was administered intravenously. Moderate Sedation Time: 23 minutes. The patient's level of consciousness and vital signs were monitored continuously by radiology nursing throughout the procedure under my direct supervision. CONTRAST:  None. COMPLICATIONS: None immediate. PROCEDURE: RADIATION DOSE REDUCTION: This exam was performed according to the departmental dose-optimization program which includes automated exposure control, adjustment of the mA and/or kV according to patient size and/or use of iterative reconstruction technique. Informed consent was obtained from the patient following an explanation of the procedure, risks, benefits and  alternatives. A time out was performed prior to the initiation of the procedure. The patient was positioned prone on the CT table and a limited CT was performed for procedural planning demonstrating the lower pole the LEFT kidney. The procedure was planned. The operative site was prepped and draped in the usual sterile fashion. Appropriate trajectory was confirmed with a 22 gauge spinal needle after the adjacent tissues were anesthetized with 1% Lidocaine with epinephrine. Under intermittent CT guidance, a 17 gauge coaxial needle was advanced into the peripheral aspect of the LEFT inferior renal pole. Appropriate positioning was confirmed and 4 samples were obtained with an 18 gauge core needle biopsy device. The co-axial needle was removed and hemostasis was achieved with manual compression. A limited postprocedural CT was negative for hemorrhage or additional complication. A dressing was placed. The patient tolerated the procedure well without immediate postprocedural complication. IMPRESSION: Successful CT guided non-targeted LEFT renal core biopsy. Roanna Banning, MD [facility] Electronically Signed   By: Roanna Banning M.D.   On: 04/02/2023 15:32   CT CHEST ABDOMEN PELVIS WO CONTRAST  Result Date: 03/31/2023 CLINICAL DATA:  Sepsis. History of spinal cord angioma with resection in 2015. 1 week of fever and malaise. DKA. EXAM: CT CHEST, ABDOMEN AND PELVIS WITHOUT CONTRAST TECHNIQUE: Multidetector CT imaging of the chest, abdomen and pelvis was performed following the standard protocol without IV contrast. RADIATION DOSE REDUCTION: This exam was performed according to the departmental dose-optimization program  which includes automated exposure control, adjustment of the mA and/or kV according to patient size and/or use of iterative reconstruction technique. COMPARISON:  03/23/2014 FINDINGS: CT CHEST FINDINGS Cardiovascular: Normal heart size. No  pericardial effusion. No new acute vascular findings without contrast Mediastinum/Nodes: No mediastinal mass or adenopathy Lungs/Pleura: Very low volume lungs with airspace opacity bilaterally. There is also bandlike opacity with volume loss especially in the right lower lobe, consistent with multi segment collapse. No effusion or air leak. Musculoskeletal: Prior multilevel laminectomy for reported resection. No acute finding CT ABDOMEN PELVIS FINDINGS Hepatobiliary: No focal liver abnormality.No evidence of biliary obstruction or stone. Pancreas: Unremarkable. Spleen: Unremarkable. Adrenals/Urinary Tract: Negative adrenals. No hydronephrosis or stone. 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 Unremarkable bladder. Stomach/Bowel: Small bowel and colonic fluid levels, colonic fluid levels reach the distal colon. No evidence of bowel inflammation. Vascular/Lymphatic: No acute vascular abnormality. No mass or adenopathy. Reproductive:No pathologic findings. Other: No ascites or pneumoperitoneum. Musculoskeletal: No acute abnormalities. IMPRESSION: 1. Multifocal pneumonia. Lung volumes are low and there is superimposed atelectasis. 2. Small bowel and colonic fluid levels with mild distension, question enteritis or ileus. Electronically Signed   By: Tiburcio Pea M.D.   On: 03/31/2023 07:28   DG CHEST PORT 1 VIEW  Result Date: 03/29/2023 CLINICAL DATA:  Shortness of breath. EXAM: PORTABLE CHEST 1 VIEW COMPARISON:  Chest radiographs 03/28/2023 and 03/26/2023 FINDINGS: Cardiac silhouette is again mildly enlarged. Mediastinal contours are grossly within normal limits for AP technique and low lung volumes. Interval removal of the prior left internal jugular central venous catheter. Moderately decreased lung volumes. Elevation of the right hemidiaphragm is unchanged. Heterogeneous airspace opacification silhouetting the right hemidiaphragm is not significant changed. Additional heterogeneous airspace  opacification within the left mid lung is not simply changed. It is difficult to exclude an underlying right pleural effusion, has on prior. No definite left pleural effusion is seen. No pneumothorax is seen. No acute skeletal abnormality. IMPRESSION: 1. Interval removal of the prior left internal jugular central venous catheter. 2. No significant change in heterogeneous airspace opacification within the right lower lung and left mid lung. Again this may represent multifocal pneumonia. 3. Unchanged elevation of the right hemidiaphragm. Electronically Signed   By: Neita Garnet M.D.   On: 03/29/2023 14:24   DG Chest Port 1 View  Result Date: 03/28/2023 CLINICAL DATA:  47 year old male with history of acute respiratory failure. EXAM: PORTABLE CHEST 1 VIEW COMPARISON:  Chest x-ray 03/26/2023. FINDINGS: There is a left-sided internal jugular central venous catheter with tip terminating in the distal superior vena cava. Previously noted nasogastric and endotracheal tubes have been removed. Lung volumes are low. Elevation of the right hemidiaphragm. Opacity at the right base which may reflect atelectasis and/or consolidation, with superimposed moderate to large right pleural effusion. Widespread patchy interstitial and airspace disease scattered throughout the lungs bilaterally. Heart size is enlarged. Upper mediastinal contours are distorted by patient positioning. IMPRESSION: 1. Support apparatus, as above. 2. The appearance of the chest is favored to reflect multilobar bilateral bronchopneumonia, although some degree of underlying heart failure is also suspected given the cardiac enlargement and diffuse interstitial prominence. 3. Moderate to large right pleural effusion. Electronically Signed   By: Trudie Reed M.D.   On: 03/28/2023 06:07   DG CHEST PORT 1 VIEW  Result Date: 03/26/2023 CLINICAL DATA:  Fever. EXAM: PORTABLE CHEST 1 VIEW COMPARISON:  March 25, 2023. FINDINGS: Stable cardiomediastinal  silhouette. Endotracheal and nasogastric tubes are unchanged in position. Left  internal jugular catheter is unchanged. Stable moderate size right pleural effusion with associated right upper lobe atelectasis or infiltrate. Stable patchy opacities are noted in the left lung. Bony thorax is unremarkable. IMPRESSION: Stable support apparatus. Stable moderate right pleural effusion with associated right upper lobe atelectasis or infiltrate. Stable left lung opacities. Electronically Signed   By: Lupita Raider M.D.   On: 03/26/2023 15:55   ECHOCARDIOGRAM COMPLETE  Result Date: 03/25/2023    ECHOCARDIOGRAM REPORT   Patient Name:   CELVIN PARA Date of Exam: 03/25/2023 Medical Rec #:  [id number]        Height:       68.0 in Accession #:    [id number]       Weight:       190.3 lb Date of Birth:  1976/01/10        BSA:          2.001 m Patient Age:    47 years         BP:           115/84 mmHg Patient Gender: M                HR:           101 bpm. Exam Location:  Inpatient Procedure: 2D Echo, Cardiac Doppler and Color Doppler Indications:    Shock R57.9  History:        Patient has no prior history of Echocardiogram examinations.                 Risk Factors:Diabetes.  Sonographer:    Dondra Prader RVT RCS Referring Phys: [id number] Lorin Glass  Sonographer Comments: Technically challenging study due to limited acoustic windows, Technically difficult study due to poor echo windows, suboptimal parasternal window, suboptimal apical window and suboptimal subcostal window. Technically challenging exam due to patients position and respiratory motion. IMPRESSIONS  1. Technically difficult study with poor visualization of cardiac structures.  2. Left ventricular ejection fraction, by estimation, is 50 to 55%. The left ventricle has low normal function. The left ventricle has no regional wall motion abnormalities. Left ventricular diastolic function could not be evaluated.  3. Right ventricular systolic function is normal.  The right ventricular size is normal.  4. The mitral valve is normal in structure. No evidence of mitral valve regurgitation. No evidence of mitral stenosis.  5. The aortic valve is normal in structure. There is mild calcification of the aortic valve. Aortic valve regurgitation is not visualized. No aortic stenosis is present.  6. The inferior vena cava is normal in size with greater than 50% respiratory variability, suggesting right atrial pressure of 3 mmHg. FINDINGS  Left Ventricle: Left ventricular ejection fraction, by estimation, is 50 to 55%. The left ventricle has low normal function. The left ventricle has no regional wall motion abnormalities. The left ventricular internal cavity size was normal in size. There is no left ventricular hypertrophy. Left ventricular diastolic function could not be evaluated due to indeterminate diastolic function. Left ventricular diastolic function could not be evaluated. Right Ventricle: The right ventricular size is normal. No increase in right ventricular wall thickness. Right ventricular systolic function is normal. Left Atrium: Left atrial size was normal in size. Right Atrium: Right atrial size was normal in size. Pericardium: There is no evidence of pericardial effusion. Mitral Valve: The mitral valve is normal in structure. No evidence of mitral valve regurgitation. No evidence of mitral valve stenosis. Tricuspid Valve: The tricuspid valve is  normal in structure. Tricuspid valve regurgitation is not demonstrated. No evidence of tricuspid stenosis. Aortic Valve: The aortic valve is normal in structure. There is mild calcification of the aortic valve. Aortic valve regurgitation is not visualized. No aortic stenosis is present. Aortic valve mean gradient measures 2.0 mmHg. Aortic valve peak gradient measures 3.6 mmHg. Aortic valve area, by VTI measures 1.78 cm. Pulmonic Valve: The pulmonic valve was normal in structure. Pulmonic valve regurgitation is not visualized. No  evidence of pulmonic stenosis. Aorta: The aortic root is normal in size and structure. Venous: The inferior vena cava is normal in size with greater than 50% respiratory variability, suggesting right atrial pressure of 3 mmHg. IAS/Shunts: No atrial level shunt detected by color flow Doppler.  LEFT VENTRICLE PLAX 2D LVIDd:         5.00 cm LVIDs:         3.80 cm LV PW:         0.90 cm LV IVS:        0.60 cm LVOT diam:     1.60 cm LV SV:         27 LV SV Index:   13 LVOT Area:     2.01 cm  IVC IVC diam: 1.40 cm LEFT ATRIUM           Index        RIGHT ATRIUM          Index LA diam:      3.10 cm 1.55 cm/m   RA Area:     8.17 cm LA Vol (A4C): 22.7 ml 11.34 ml/m  RA Volume:   15.30 ml 7.65 ml/m  AORTIC VALVE                    PULMONIC VALVE AV Area (Vmax):    1.85 cm     PV Vmax:       0.78 m/s AV Area (Vmean):   1.73 cm     PV Peak grad:  2.4 mmHg AV Area (VTI):     1.78 cm AV Vmax:           94.30 cm/s AV Vmean:          68.700 cm/s AV VTI:            0.151 m AV Peak Grad:      3.6 mmHg AV Mean Grad:      2.0 mmHg LVOT Vmax:         86.60 cm/s LVOT Vmean:        59.000 cm/s LVOT VTI:          0.134 m LVOT/AV VTI ratio: 0.89  AORTA Ao Root diam: 3.40 cm Ao Asc diam:  3.10 cm MITRAL VALVE MV Area (PHT): 5.62 cm    SHUNTS MV Decel Time: 135 msec    Systemic VTI:  0.13 m MV E velocity: 79.77 cm/s  Systemic Diam: 1.60 cm MV A velocity: 57.00 cm/s MV E/A ratio:  1.40 Aditya Sabharwal Electronically signed by Dorthula Nettles Signature Date/Time: 03/25/2023/2:58:57 PM    Final    VAS [place] LOWER EXTREMITY VENOUS (DVT)  Result Date: 03/25/2023  Lower Venous DVT Study Patient Name:  KAEO MCNAIR  Date of Exam:   03/25/2023 Medical Rec #: [id number]         Accession #:    [id number] Date of Birth: 1975/07/26         Patient Gender: M Patient Age:   83 years  Exam Location[facility]  [facility] Procedure:      VAS [place] LOWER EXTREMITY VENOUS (DVT) Referring Phys: Levon Hedger  --------------------------------------------------------------------------------  Indications: Edema.  Risk Factors: None identified. Limitations: Poor ultrasound/tissue interface and patient positioning. Comparison Study: No prior studies. Performing Technologist: Chanda Busing RVT  Examination Guidelines: A complete evaluation includes B-mode imaging, spectral Doppler, color Doppler, and power Doppler as needed of all accessible portions of each vessel. Bilateral testing is considered an integral part of a complete examination. Limited examinations for reoccurring indications may be performed as noted. The reflux portion of the exam is performed with the patient in reverse Trendelenburg.  +---------+---------------+---------+-----------+----------+--------------+ RIGHT    CompressibilityPhasicitySpontaneityPropertiesThrombus Aging +---------+---------------+---------+-----------+----------+--------------+ CFV      Full           Yes      Yes                                 +---------+---------------+---------+-----------+----------+--------------+ SFJ      Full                                                        +---------+---------------+---------+-----------+----------+--------------+ FV Prox  Full                                                        +---------+---------------+---------+-----------+----------+--------------+ FV Mid   Full                                                        +---------+---------------+---------+-----------+----------+--------------+ FV DistalFull                                                        +---------+---------------+---------+-----------+----------+--------------+ PFV      Full                                                        +---------+---------------+---------+-----------+----------+--------------+ POP      Full           Yes      Yes                                  +---------+---------------+---------+-----------+----------+--------------+ PTV      Full                                                        +---------+---------------+---------+-----------+----------+--------------+  PERO     Full                                                        +---------+---------------+---------+-----------+----------+--------------+   +---------+---------------+---------+-----------+----------+--------------+ LEFT     CompressibilityPhasicitySpontaneityPropertiesThrombus Aging +---------+---------------+---------+-----------+----------+--------------+ CFV      Full           Yes      Yes                                 +---------+---------------+---------+-----------+----------+--------------+ SFJ      Full                                                        +---------+---------------+---------+-----------+----------+--------------+ FV Prox  Full                                                        +---------+---------------+---------+-----------+----------+--------------+ FV Mid   Full                                                        +---------+---------------+---------+-----------+----------+--------------+ FV DistalFull                                                        +---------+---------------+---------+-----------+----------+--------------+ PFV      Full                                                        +---------+---------------+---------+-----------+----------+--------------+ POP      Full           Yes      Yes                                 +---------+---------------+---------+-----------+----------+--------------+ PTV      Full                                                        +---------+---------------+---------+-----------+----------+--------------+ PERO     Full                                                         +---------+---------------+---------+-----------+----------+--------------+  Summary: RIGHT: - There is no evidence of deep vein thrombosis in the lower extremity.  - No cystic structure found in the popliteal fossa.  LEFT: - There is no evidence of deep vein thrombosis in the lower extremity.  - No cystic structure found in the popliteal fossa.  *See table(s) above for measurements and observations. Electronically signed by Sherald Hess MD on 03/25/2023 at 2:11:03 PM.    Final    [place] CHEST (PLEURAL EFFUSION)  Result Date: 03/25/2023 INDICATION: 47 year old male currently admitted due to shortness of breath, pneumonia, and sepsis. Previous chest x-ray showed right pleural effusion. Request for therapeutic and diagnostic thoracentesis. EXAM: CHEST ULTRASOUND COMPARISON:  None Available. FINDINGS: Bilateral pleural space visualized with ultrasound which revealed no pleural effusion. Thoracentesis was not performed. IMPRESSION: No pleural effusion.  Thoracentesis was not performed. Performed by: Lawernce Ion, PA-C Electronically Signed   By: Marin Roberts M.D.   On: 03/25/2023 12:18   DG Chest Port 1 View  Result Date: 03/25/2023 CLINICAL DATA:  Fever.  Endotracheal tube. EXAM: PORTABLE CHEST 1 VIEW COMPARISON:  March 24, 2023. FINDINGS: Stable cardiomediastinal silhouette. Endotracheal and nasogastric tubes are in grossly good position. Left internal jugular catheter is noted with distal tip in expected position of cavoatrial junction. No pneumothorax is noted. Moderate right pleural effusion is noted with associated right-sided pneumonia or atelectasis. Stable left lung opacities are noted as well. IMPRESSION: Stable bilateral lung opacities are noted with associated moderate right pleural effusion. Endotracheal and nasogastric tubes are in grossly good position. Left internal jugular catheter is noted with distal tip in expected position of cavoatrial junction. Electronically Signed   By:  Lupita Raider M.D.   On: 03/25/2023 09:07   US RENAL  Result Date: 03/24/2023 CLINICAL DATA:  [id number] Acute kidney failure (HCC) [id number] EXAM: RENAL / URINARY TRACT ULTRASOUND COMPLETE COMPARISON:  March 23, 2014 FINDINGS: Right Kidney: Renal measurements: 11.8 x 5.4 x 5.0 cm = volume: 167 mL. Echogenicity within normal limits. No mass or hydronephrosis visualized. Left Kidney: Renal measurements: 11.8 x 5.5 x 6.0 cm = volume: 204 mL. Echogenicity within normal limits. No mass or hydronephrosis visualized. Bladder: Nearly completely decompressed. There is a focus of rounded high density noted within the bladder which may reflect a Foley balloon, suboptimally visualized. Other: None. IMPRESSION: 1. No hydronephrosis. 2. Likely Foley balloon within the decompressed bladder, suboptimally visualized. Recommend correlation with history of Foley placement. Electronically Signed   By: Meda Klinefelter M.D.   On: 03/24/2023 19:33   DG Abd 1 View  Result Date: 03/24/2023 CLINICAL DATA:  Enteric tube placement. EXAM: ABDOMEN - 1 VIEW COMPARISON:  CT abdomen pelvis dated 03/23/2014. FINDINGS: An enteric tube terminates in the stomach. Multiple distended loops of bowel are seen throughout the abdomen. IMPRESSION: Enteric tube terminates in the stomach. Electronically Signed   By: Romona Curls M.D.   On: 03/24/2023 14:52   DG CHEST PORT 1 VIEW  Result Date: 03/24/2023 CLINICAL DATA:  10027 Tachypnea [id number] Dyspnea [id number] EXAM: PORTABLE CHEST 1 VIEW COMPARISON:  March 22, 2023 FINDINGS: The cardiomediastinal silhouette is unchanged in contour. Small RIGHT pleural effusion. No pneumothorax. Increased LEFT-sided airspace opacities compared to prior. Similar to mildly increased RIGHT-sided heterogeneous opacities. IMPRESSION: 1. Increased LEFT-sided airspace opacities compared to prior. Similar to mildly increased RIGHT-sided heterogeneous opacities. Findings are concerning for multifocal pneumonia. 2.  Small RIGHT pleural effusion. Electronically Signed   By: Meda Klinefelter M.D.   On: 03/24/2023 12:05  DG Chest 2 View  Result Date: 03/22/2023 CLINICAL DATA:  Cough.  Fever. EXAM: CHEST - 2 VIEW COMPARISON:  None Available. FINDINGS: Low lung volume. There are patchy heterogeneous opacities throughout bilateral lungs with relative sparing of upper lobes, compatible with multilobar pneumonia versus ARDS. There is small-to-moderate right pleural effusion with probable associated right lower lobe collapse. No left pleural effusion. Normal cardio-mediastinal silhouette. No acute osseous abnormalities. The soft tissues are within normal limits. IMPRESSION: *There are patchy heterogeneous opacities throughout bilateral lungs, compatible with multilobar pneumonia versus ARDS. *There is small-to-moderate right pleural effusion with probable associated right lower lobe collapse. No left pleural effusion. Follow-up to clearing is recommended. Electronically Signed   By: Jules Schick M.D.   On: 03/22/2023 12:45    Micro Results     No results found for this or any previous visit (from the past 240 hour(s)).  Today   Subjective    Haroldo Arnwine today has no headache,no chest abdominal pain,no new weakness tingling or numbness, feels much better wants to go home today.     Objective   Blood pressure 124/81, pulse 90, temperature 97.7 F (36.5 C), temperature source Oral, resp. rate 18, height 5\' 8"  (1.727 m), weight 80.5 kg, SpO2 96%.   Intake/Output Summary (Last 24 hours) at 04/08/2023 0815 Last data filed at 04/08/2023 0500 Gross per 24 hour  Intake --  Output 2050 ml  Net -2050 ml    Exam  Awake Alert, No new F.N deficits,    [place].AT,PERRAL Supple Neck,   Symmetrical Chest wall movement, Good air movement bilaterally, CTAB RRR,No Gallops,   +ve B.Sounds, Abd Soft, Non tender,  No Cyanosis, Clubbing or edema    Data Review   Recent Labs  Lab 04/02/23 0432 04/03/23 0325  04/04/23 0443 04/05/23 0428 04/08/23 0436  WBC 27.0* 21.1* 18.0* 18.2* 7.5  HGB 9.9* 9.5* 10.1* 10.1* 8.6*  HCT 30.4* 29.2* 30.3* 31.1* 26.9*  PLT 183 171 149* 146* 124*  MCV 68.9* 68.5* 69.0* 69.9* 71.2*  MCH 22.4* 22.3* 23.0* 22.7* 22.8*  MCHC 32.6 32.5 33.3 32.5 32.0  RDW 17.9* 17.9* 18.4* 18.8* 19.9*  LYMPHSABS  --   --   --   --  0.8  MONOABS  --   --   --   --  0.4  EOSABS  --   --   --   --  0.1  BASOSABS  --   --   --   --  0.0    Recent Labs  Lab 04/02/23 0432 04/03/23 0325 04/04/23 0443 04/05/23 0428 04/06/23 0740 04/08/23 0436  NA 133* 134* 137 133* 133* 135  K 3.4* 3.8 3.7 4.6 3.9 3.9  CL 98 99 99 95* 97* 101  CO2 21* 22 20* 22 24 23   ANIONGAP 14 13 18* 16* 12 11  GLUCOSE 130* 246* 164* 281* 150* 107*  BUN 92* 90* 84* 81* 79* 67*  CREATININE 3.60* 3.66* 3.61* 3.28* 3.21* 2.61*  ALBUMIN  --   --   --   --  2.6*  --   CRP 1.9* 1.5*  --   --   --   --   PROCALCITON 0.99  --   --   --   --   --   MG 1.9 2.1 2.1 2.0  --   --   CALCIUM 7.5* 7.8* 8.1* 8.2* 8.6* 8.6*    Total Time in preparing paper work, data evaluation and todays exam - 35 minutes  Signature  -    Susa Raring M.D on 04/08/2023 at 8:15 AM   -  To page go to [email or web address]

## 2023-04-08 NOTE — Progress Notes (Signed)
Mobility Specialist Progress Note;   04/08/23 0930  Mobility  Activity Ambulated with assistance in hallway  Level of Assistance Standby assist, set-up cues, supervision of patient - no hands on  Assistive Device Front wheel walker  Distance Ambulated (ft) 250 ft  Activity Response Tolerated well  Mobility Referral Yes  $Mobility charge 1 Mobility  Mobility Specialist Start Time (ACUTE ONLY) 0930  Mobility Specialist Stop Time (ACUTE ONLY) 0945  Mobility Specialist Time Calculation (min) (ACUTE ONLY) 15 min   Pt agreeable to mobility. Required no physical assistance during ambulation, SV. Asx throughout and no c/o during session. Pt back in bed with all needs met.   Caesar Bookman Mobility Specialist Please contact via SecureChat or Rehab Office [phone]

## 2023-04-08 NOTE — Plan of Care (Signed)
Problem: Fluid Volume: Goal: Hemodynamic stability will improve Outcome: Progressing   Problem: Clinical Measurements: Goal: Diagnostic test results will improve Outcome: Progressing Goal: Signs and symptoms of infection will decrease Outcome: Progressing   Problem: Respiratory: Goal: Ability to maintain adequate ventilation will improve Outcome: Progressing   Problem: Education: Goal: Ability to describe self-care measures that may prevent or decrease complications (Diabetes Survival Skills Education) will improve Outcome: Progressing Goal: Individualized Educational Video(s) Outcome: Progressing   Problem: Coping: Goal: Ability to adjust to condition or change in health will improve Outcome: Progressing   Problem: Fluid Volume: Goal: Ability to maintain a balanced intake and output will improve Outcome: Progressing   Problem: Health Behavior/Discharge Planning: Goal: Ability to identify and utilize available resources and services will improve Outcome: Progressing Goal: Ability to manage health-related needs will improve Outcome: Progressing   Problem: Metabolic: Goal: Ability to maintain appropriate glucose levels will improve Outcome: Progressing   Problem: Nutritional: Goal: Maintenance of adequate nutrition will improve Outcome: Progressing Goal: Progress toward achieving an optimal weight will improve Outcome: Progressing   Problem: Skin Integrity: Goal: Risk for impaired skin integrity will decrease Outcome: Progressing   Problem: Tissue Perfusion: Goal: Adequacy of tissue perfusion will improve Outcome: Progressing   Problem: Education: Goal: Ability to describe self-care measures that may prevent or decrease complications (Diabetes Survival Skills Education) will improve Outcome: Progressing Goal: Individualized Educational Video(s) Outcome: Progressing   Problem: Cardiac: Goal: Ability to maintain an adequate cardiac output will  improve Outcome: Progressing   Problem: Health Behavior/Discharge Planning: Goal: Ability to identify and utilize available resources and services will improve Outcome: Progressing Goal: Ability to manage health-related needs will improve Outcome: Progressing   Problem: Fluid Volume: Goal: Ability to achieve a balanced intake and output will improve Outcome: Progressing   Problem: Metabolic: Goal: Ability to maintain appropriate glucose levels will improve Outcome: Progressing   Problem: Nutritional: Goal: Maintenance of adequate nutrition will improve Outcome: Progressing Goal: Maintenance of adequate weight for body size and type will improve Outcome: Progressing   Problem: Respiratory: Goal: Will regain and/or maintain adequate ventilation Outcome: Progressing   Problem: Urinary Elimination: Goal: Ability to achieve and maintain adequate renal perfusion and functioning will improve Outcome: Progressing   Problem: Education: Goal: Knowledge of General Education information will improve Description: Including pain rating scale, medication(s)/side effects and non-pharmacologic comfort measures Outcome: Progressing   Problem: Health Behavior/Discharge Planning: Goal: Ability to manage health-related needs will improve Outcome: Progressing   Problem: Clinical Measurements: Goal: Ability to maintain clinical measurements within normal limits will improve Outcome: Progressing Goal: Will remain free from infection Outcome: Progressing Goal: Diagnostic test results will improve Outcome: Progressing Goal: Respiratory complications will improve Outcome: Progressing Goal: Cardiovascular complication will be avoided Outcome: Progressing   Problem: Activity: Goal: Risk for activity intolerance will decrease Outcome: Progressing   Problem: Nutrition: Goal: Adequate nutrition will be maintained Outcome: Progressing   Problem: Coping: Goal: Level of anxiety will  decrease Outcome: Progressing   Problem: Elimination: Goal: Will not experience complications related to bowel motility Outcome: Progressing Goal: Will not experience complications related to urinary retention Outcome: Progressing   Problem: Pain Management: Goal: General experience of comfort will improve Outcome: Progressing   Problem: Safety: Goal: Ability to remain free from injury will improve Outcome: Progressing   Problem: Skin Integrity: Goal: Risk for impaired skin integrity will decrease Outcome: Progressing   Problem: Activity: Goal: Ability to tolerate increased activity will improve Outcome: Progressing   Problem: Respiratory: Goal: Ability to  maintain a clear airway and adequate ventilation will improve Outcome: Progressing   Problem: Role Relationship: Goal: Method of communication will improve Outcome: Progressing

## 2023-04-08 NOTE — Plan of Care (Signed)
                                      [facility]                            [address] [place], [place] [address]      Gary Conway was admitted to the Hospital on 03/22/2023 and Discharged  04/08/2023 and should be excused from work/school   for 21  days starting from date -  03/22/2023 , may return to work/school without any restrictions.  Call Susa Raring MD, [facility]  [phone] with questions.  Susa Raring M.D on 04/08/2023,at 8:02 AM  [facility]   Office  [phone]

## 2023-04-08 NOTE — Inpatient Diabetes Management (Signed)
Inpatient Diabetes Program Recommendations  AACE/ADA: New Consensus Statement on Inpatient Glycemic Control (2015)  Target Ranges:  Prepandial:   less than 140 mg/dL      Peak postprandial:   less than 180 mg/dL (1-2 hours)      Critically ill patients:  140 - 180 mg/dL   Lab Results  Component Value Date   GLUCAP 127 (H) 04/08/2023   HGBA1C 8.1 (H) 03/22/2023    Review of Glycemic Control  Latest Reference Range & Units 04/07/23 10:02 04/07/23 12:17 04/07/23 16:48 04/07/23 21:55 04/08/23 08:19  Glucose-Capillary 70 - 99 mg/dL 161 (H) 096 (H) 045 (H) 222 (H) 127 (H)  (H): Data is abnormally high  Diabetes history: DM2 Outpatient Diabetes medications: Metformin 500 mg BID  Current orders for Inpatient glycemic control: Semglee 28 units BID, Novolog 0-20 units TID and 0-5 units at bedtime, Novolog 6 units TID   Spoke with  patient at bedside.  He has been on steroids while inpatient and glucose trends have been elevated requiring basal/bolus.  MD will be discharging on Lantus 35 units QD and Lispro 0-15 units TID.    Educated patient on insulin pen use at home. Reviewed contents of insulin flexpen starter kit. Reviewed all steps of insulin pen including attachment of needle, 2-unit air shot, dialing up dose, giving injection, removing needle, disposal of sharps, storage of unused insulin, disposal of insulin etc. Patient able to provide successful return demonstration. Also reviewed troubleshooting with insulin pen. MATCH letter to [facility] pharmacy for meds to bed.    Educated him on long and rapid insulins and sliding scale directions.    Discussed hypoglycemia, signs, symptoms and treatment.    Dr. Thedore Mins gave me permission to apply a Freestyle Libre 3 CGM.  He has an Theme park manager but he lost his wallet and his sister locked his credit cards.  The FSL app needs a credit card connected to the phone.  He will get this taken care of once he gets home.  Provided him with 2 FSL 3 sensors.  He states  his sister will be able to help him apply the CGM.  Provided him with a ReliOn glucometer and supplies.  He should check his BG ac/hs.    He will be making a new patient appointment with his mother and sister's PCP.  Encouraged him to make an appointment ASAP and bring his phone and meter with him for glucose trend review.  If his trends begin to run less than 100 mg/dL he should call his PCP.    Attached education to his AVS.  All questions answered.    Will continue to follow while inpatient.  Thank you, Dulce Sellar, MSN, CDCES Diabetes Coordinator Inpatient Diabetes Program [phone] (team pager from 8a-5p)

## 2023-04-08 NOTE — Telephone Encounter (Signed)
Called # on file and rechecked 3 times. I got a Dr's office. Stated I had the WN.Will send letter.

## 2023-04-08 NOTE — TOC CM/SW Note (Signed)
[facility] MEDICATION ASSISTANCE CARD Pharmacies please call [phone] for claim processing assistance.  Rx BIN: [id number] Rx Group: [id number] Rx PCN: [id number] Relationship Code: 1 Person Code: 01  Patient ID (MRN): [id number]      Patient Name: Gary Conway    Patient DOB: 11-04-1975   Discharge Date: 04/08/2023  Expiration Date:04/15/2023 (must be filled within 7 days of discharge)   Dear Gary Conway have been approved to have the prescriptions written by your discharging physician filled through our [facility] (Medication Assistance Through [facility]) program. This program allows for a one-time (no refills) 34-day supply of selected medications for a low copay amount.  The copay is $3.00 per prescription. For instance, if you have one prescription, you will pay $3.00; for two prescriptions, you pay $6.00; for three prescriptions, you pay $9.00; and so on. Only certain pharmacies are participating in this program with [facility]. You will need to select one of the pharmacies from the attached lists and take your prescriptions, this letter, and your photo ID to one of the participating pharmacies.  We are excited that you are able to use the [facility] program to get your medications. These prescriptions must be filled within 7 days of hospital discharge or they will no longer be valid for the [facility] program. Should you have any problems with your prescriptions please contact your case management team member at [phone] for [name] or [phone] for [facility].  Thank you, [facility]

## 2023-04-08 NOTE — Progress Notes (Signed)
Patient waiting to see Diabetic educator before discharge. Called diabetic educator to make aware patient needs to be seen before discharge.

## 2023-04-08 NOTE — Discharge Instructions (Addendum)
Follow with Primary MD in 7 days   Get CBC, CMP, 2 view Chest X ray -  checked next visit with your primary MD   Activity: As tolerated with Full fall precautions use walker/cane & assistance as needed  Disposition Home    Diet: Heart Healthy low carbohydrate diet, 1.5 L fluid restriction per day.  CBGs q. ACH S.  Accuchecks 4 times/day, Once in AM empty stomach and then before each meal. Log in all results and show them to your Prim.MD in 3 days. If any glucose reading is under 80 or above 300 call your Prim MD immidiately. Follow Low glucose instructions for glucose under 80 as instructed.   Special Instructions: If you have smoked or chewed Tobacco  in the last 2 yrs please stop smoking, stop any regular Alcohol  and or any Recreational drug use.  On your next visit with your primary care physician please Get Medicines reviewed and adjusted.  Please request your Prim.MD to go over all Hospital Tests and Procedure/Radiological results at the follow up, please get all Hospital records sent to your Prim MD by signing hospital release before you go home.  If you experience worsening of your admission symptoms, develop shortness of breath, life threatening emergency, suicidal or homicidal thoughts you must seek medical attention immediately by calling 911 or calling your MD immediately  if symptoms less severe.  You Must read complete instructions/literature along with all the possible adverse reactions/side effects for all the Medicines you take and that have been prescribed to you. Take any new Medicines after you have completely understood and accpet all the possible adverse reactions/side effects.   Do not drive when taking Pain medications.  Do not take more than prescribed Pain, Sleep and Anxiety Medications

## 2023-04-12 LAB — SURGICAL PATHOLOGY

## 2023-04-15 ENCOUNTER — Ambulatory Visit: Payer: Self-pay | Admitting: Family

## 2023-04-15 NOTE — Progress Notes (Deleted)
New Patient Office Visit  Subjective:  Patient ID: Gary Conway, male    DOB: 01-22-1976  Age: 47 y.o. MRN: [id number]  CC: No chief complaint on file.   HPI Gary Conway presents for establishing care today.  d/c hospital 11/11 for resp failure/sepsis/CAP/neoplasm spine- resected in 2015; needs BMP, CBC, CXR - f/u w/Pulmonary & Cardiology? hx DM2, HTN - meds coreg, lasix, semglee insulin, novolog insuin nebulizer duoneb, ferrous sul, glucometer  Assessment & Plan:  There are no diagnoses linked to this encounter.  Subjective:    Outpatient Medications Prior to Visit  Medication Sig Dispense Refill   Accu-Chek Softclix Lancets lancets Use 1 each in the morning, at noon, and at bedtime. 100 each 0   blood glucose meter kit and supplies KIT Dispense based on patient and insurance preference. Use up to four times daily as directed. (FOR ICD-9 250.00, 250.01). For QAC - HS accuchecks. 1 each 1   Blood Glucose Monitoring Suppl (BLOOD GLUCOSE MONITOR SYSTEM) w/Device KIT Use to test blood sugar in the morning, at noon, and at bedtime. 1 kit 0   carvedilol (COREG) 12.5 MG tablet Take 1 tablet (12.5 mg total) by mouth 2 (two) times daily with a meal. 60 tablet 0   ferrous sulfate 325 (65 FE) MG tablet Take 1 tablet (325 mg total) by mouth daily with breakfast. 30 tablet 0   furosemide (LASIX) 80 MG tablet Take 1 tablet (80 mg total) by mouth daily. 30 tablet 0   Glucose Blood (BLOOD GLUCOSE TEST STRIPS) STRP Use 1 each in the morning, at noon, and at bedtime. 100 strip 0   insulin glargine (LANTUS SOLOSTAR) 100 UNIT/ML Solostar Pen Inject 35 Units into the skin daily. 3 mL 0   insulin lispro (HUMALOG) 100 UNIT/ML KwikPen Before each meal, inject subcutaneously 3 times a day:  140-199 - 2 units, 200-250 - 6 units, 251-299 - 8 units,  300-349 - 10 units,  350 or above 14 units. 15 mL 0   Insulin Pen Needle 32G X 4 MM MISC For 4 times a day insulin Subcutaneously. 100 each 0    ipratropium-albuterol (DUONEB) 0.5-2.5 (3) MG/3ML SOLN Use twice a day scheduled and every 4 hours as needed for shortness of breath and wheezing. 360 mL 0   Lancet Device MISC 1 each by Does not apply route in the morning, at noon, and at bedtime. May substitute to any manufacturer covered by patient's insurance. 1 each 0   No facility-administered medications prior to visit.   Past Medical History:  Diagnosis Date   Diabetes mellitus, type II (HCC) 2015   Diagnosed during 2015 admission to [facility]   Hemangioma of spine 2015   S/P excision at [facility] with pathology favoring benign Hemangioma   Past Surgical History:  Procedure Laterality Date   LAMINECTOMY N/A 03/22/2014   Procedure: THORACIC two - four  LAMINECTOMY FOR TUMOR;  Surgeon: Temple Pacini, MD;  Location: [facility] NEURO ORS;  Service: Neurosurgery;  Laterality: N/A;  Throacic two  - four laminectomy for tumor   LEG SURGERY      Objective:   Today's Vitals: There were no vitals taken for this visit.  Physical Exam Vitals and nursing note reviewed.  Constitutional:      General: He is not in acute distress.    Appearance: Normal appearance.  HENT:     Head: Normocephalic.  Cardiovascular:     Rate and Rhythm: Normal rate and regular rhythm.  Pulmonary:  Effort: Pulmonary effort is normal.     Breath sounds: Normal breath sounds.  Musculoskeletal:        General: Normal range of motion.     Cervical back: Normal range of motion.  Skin:    General: Skin is warm and dry.  Neurological:     Mental Status: He is alert and oriented to person, place, and time.  Psychiatric:        Mood and Affect: Mood normal.     No orders of the defined types were placed in this encounter.   Dulce Sellar, NP

## 2023-05-01 NOTE — Therapy (Signed)
OUTPATIENT PHYSICAL THERAPY LOWER EXTREMITY EVALUATION   Patient Name: Gary Conway MRN: [id number] DOB:Sep 11, 1975, 47 y.o., male Today's Date: 05/03/2023  END OF SESSION:  PT End of Session - 05/02/23 1553     Visit Number 1    Number of Visits 13    Date for PT Re-Evaluation 06/28/23    Authorization Type Self    PT Start Time 1548    PT Stop Time 1630    PT Time Calculation (min) 42 min    Activity Tolerance Patient tolerated treatment well    Behavior During Therapy WFL for tasks assessed/performed             Past Medical History:  Diagnosis Date   Diabetes mellitus, type II (HCC) 2015   Diagnosed during 2015 admission to [facility]   Hemangioma of spine 2015   S/P excision at [facility] with pathology favoring benign Hemangioma   Past Surgical History:  Procedure Laterality Date   LAMINECTOMY N/A 03/22/2014   Procedure: THORACIC two - four  LAMINECTOMY FOR TUMOR;  Surgeon: Temple Pacini, MD;  Location: [facility];  Service: Neurosurgery;  Laterality: N/A;  Throacic two  - four laminectomy for tumor   LEG SURGERY     Patient Active Problem List   Diagnosis Date Noted   Hemoptysis 04/05/2023   Diffuse 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   Acute respiratory failure with hypoxia (HCC) 04/02/2023   Acute hypoxemic respiratory failure (HCC) 03/25/2023   Severe sepsis (HCC) 03/22/2023   CAP (community acquired pneumonia) 03/22/2023   Diabetic ketoacidosis without coma associated with type 2 diabetes mellitus (HCC) 09/13/2019   Lactic acidosis 09/13/2019   Benign neoplasm of spinal cord (HCC) 03/27/2014   Obesity (BMI 30-39.9) 03/24/2014   DM (diabetes mellitus) (HCC) 03/24/2014   Spinal hemangioma 03/23/2014   Leukocytosis 03/23/2014   Paraplegia (HCC) 03/22/2014   Thoracic myelopathy 03/22/2014   Numbness and tingling of both legs 03/22/2014   Leg weakness, bilateral 03/22/2014    PCP: Dulce Sellar, NP  REFERRING PROVIDER: Leroy Sea,  MD  REFERRING DIAG: R53.1 (ICD-10-CM) - Weakness   THERAPY DIAG:  Muscle weakness (generalized)  Difficulty in walking, not elsewhere classified  Rationale for Evaluation and Treatment: Rehabilitation  ONSET DATE: 03/22/23 Hospitalization  SUBJECTIVE:   SUBJECTIVE STATEMENT: Pt reports he feels weak and that his balance is not as good since being hospitalized on 03/22/23 for respiratory failure and pneumonia. 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  PERTINENT HISTORY: LAMINECTOMY N/A 03/22/2014   Hemangioma of spine 2015  DM   PAIN:  Are you having pain? No   PRECAUTIONS: None  RED FLAGS: Elevated HR 120 resting and BP 156/96 resting  WEIGHT BEARING RESTRICTIONS: No  FALLS:  Has patient fallen in last 6 months? 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  DIAGNOSTIC FINDINGS:  DG chest 03/29/23 IMPRESSION: 1. Interval removal of the prior left internal jugular central venous catheter. 2. No significant change in  heterogeneous airspace opacification within the right lower lung and left mid lung. Again this may represent multifocal pneumonia. 3. Unchanged elevation of the right hemidiaphragm.  PATIENT SURVEYS:  FOTO: Perceived function   43%, predicted   59%   COGNITION: Overall cognitive status: Within functional limits for tasks assessed     SENSATION: A little better. Gets short of breath quick. WFL for LT of the  hands  EDEMA:  Not observed  MUSCLE LENGTH: Hamstrings: Right NT deg; Left NT deg Maisie Fus test: Right NT deg; Left NT deg  POSTURE: rounded shoulders and forward head  PALPATION: NT  LOWER EXTREMITY ROM:  Grossly WNLs Active ROM Right eval Left eval  Hip flexion    Hip extension    Hip abduction    Hip adduction    Hip internal rotation    Hip external rotation    Knee flexion    Knee extension    Ankle dorsiflexion    Ankle plantarflexion    Ankle inversion    Ankle eversion     (Blank rows = not tested)  LOWER EXTREMITY MMT:  MMT Right eval Left eval  Hip flexion 4 4  Hip extension 4 4  Hip abduction 4 4  Hip adduction 4 4  Hip internal rotation 4 4  Hip external rotation 4 4  Knee flexion 4 4  Knee extension 4 4  Ankle dorsiflexion    Ankle plantarflexion    Ankle inversion    Ankle eversion     (Blank rows = not tested)  LOWER EXTREMITY SPECIAL TESTS:  NT  FUNCTIONAL TESTS:  5 times sit to stand: TBA 2 minute walk test: TBA Berg Balance Scale: TBA  GAIT: Distance walked: 200' Assistive device utilized: None Level of assistance: Complete Independence Comments: WNLs  TODAY'S TREATMENT:          [facility] Adult PT Treatment:                                                DATE: 05/02/23 EVAL Only                                                                                                                    PATIENT EDUCATION:  Education details: Eval findings, POC Person educated: Patient Education method: Explanation Education comprehension: verbalized understanding  HOME EXERCISE PROGRAM: To be provided  ASSESSMENT:  CLINICAL IMPRESSION: Patient is a 47 y.o. male who was seen today for physical therapy evaluation and treatment for R53.1 (ICD-10-CM) - Weakness. Pt presented to PT with elevated HR and BP at rest. PT today only assessed LE strength while activity tolerance and balance assessment was deferred due to pt's vital signs being  elevated above the clinical range for exertive activities. Pt was advised to see his PCP as soon as possible to have these issues assessed and addressed prior to  continuing with PT. Stephanie Hudnell's office was contact by phone to inform of this pt's condition. Pt was advised to call and schedule PT appts after he has been seen by his PCP and his vital signs are appropriate to participate in PT to address strength, balance, and activity tolerance.  OBJECTIVE IMPAIRMENTS: decreased activity tolerance, decreased balance, decreased endurance, difficulty walking, and decreased strength.   ACTIVITY LIMITATIONS: carrying, lifting, standing, stairs, and locomotion level  PARTICIPATION LIMITATIONS: meal prep, cleaning, laundry, shopping, community activity, and occupation  PERSONAL FACTORS: Fitness and Time since onset of injury/illness/exacerbation are also affecting patient's functional outcome.   REHAB POTENTIAL: Good  CLINICAL DECISION MAKING: Evolving/moderate complexity  EVALUATION COMPLEXITY: Moderate   GOALS:  SHORT TERM GOALS: Target date: 05/24/23 Pt will be Ind in an initial HEP Baseline: To be developed Goal status: INITIAL   LONG TERM GOALS: Target date: 06/28/23  Pt will be Ind in a final HEP to maintain achieved LOF  Baseline: To be developed Goal status: INITIAL  2.  Increase bilat hip strength to 4+ and knee to 5 to improve function and balance Baseline: see flow sheet Goal status: INITIAL  3.  Assesss Berg and increase by 5 points as indicated to improve pt's balance and mobility safety Baseline: TBA Goal status: INITIAL  4.  Assess 5xSTS and improve by MCID of 5" and assess and improve by MCID of 19ft as indication of improved functional mobility  Baseline: TBA Goal status: INITIAL  5.  Pt's FOTO score will improved to the predicted value of 59% as indication of improved function  Baseline: 43% Goal status: INITIAL   PLAN:  PT FREQUENCY:  2x/week  PT DURATION: 6 weeks  PLANNED INTERVENTIONS: 97164- PT Re-evaluation, 97110-Therapeutic exercises, 97530- Therapeutic activity, 97535- Self Care, 16109- Gait training, Patient/Family education, Balance training, and Stair training  PLAN FOR NEXT SESSION: Review FOTO; develop HEP; progress therex as indicated.   Hassaan Crite MS, PT 05/03/23 12:20 PM

## 2023-05-02 ENCOUNTER — Other Ambulatory Visit: Payer: Self-pay

## 2023-05-02 ENCOUNTER — Ambulatory Visit: Payer: Self-pay | Attending: Internal Medicine

## 2023-05-02 DIAGNOSIS — R262 Difficulty in walking, not elsewhere classified: Secondary | ICD-10-CM | POA: Insufficient documentation

## 2023-05-02 DIAGNOSIS — M6281 Muscle weakness (generalized): Secondary | ICD-10-CM | POA: Insufficient documentation

## 2023-05-03 ENCOUNTER — Other Ambulatory Visit: Payer: Self-pay

## 2023-05-17 ENCOUNTER — Ambulatory Visit: Payer: Self-pay | Admitting: Family

## 2023-06-17 ENCOUNTER — Inpatient Hospital Stay (HOSPITAL_COMMUNITY): Payer: Self-pay

## 2023-06-17 ENCOUNTER — Inpatient Hospital Stay (HOSPITAL_COMMUNITY)
Admission: EM | Admit: 2023-06-17 | Discharge: 2023-06-24 | DRG: 871 | Disposition: A | Discharge status: Home / Self Care | Payer: Self-pay | Attending: Family Medicine | Admitting: Family Medicine

## 2023-06-17 ENCOUNTER — Emergency Department (HOSPITAL_COMMUNITY): Payer: Self-pay

## 2023-06-17 DIAGNOSIS — J80 Acute respiratory distress syndrome: Secondary | ICD-10-CM

## 2023-06-17 DIAGNOSIS — Z794 Long term (current) use of insulin: Secondary | ICD-10-CM

## 2023-06-17 DIAGNOSIS — E1122 Type 2 diabetes mellitus with diabetic chronic kidney disease: Secondary | ICD-10-CM | POA: Diagnosis present

## 2023-06-17 DIAGNOSIS — J101 Influenza due to other identified influenza virus with other respiratory manifestations: Secondary | ICD-10-CM

## 2023-06-17 DIAGNOSIS — E1165 Type 2 diabetes mellitus with hyperglycemia: Secondary | ICD-10-CM | POA: Diagnosis present

## 2023-06-17 DIAGNOSIS — I5032 Chronic diastolic (congestive) heart failure: Secondary | ICD-10-CM | POA: Diagnosis present

## 2023-06-17 DIAGNOSIS — N179 Acute kidney failure, unspecified: Secondary | ICD-10-CM

## 2023-06-17 DIAGNOSIS — J09X1 Influenza due to identified novel influenza A virus with pneumonia: Secondary | ICD-10-CM

## 2023-06-17 DIAGNOSIS — D631 Anemia in chronic kidney disease: Secondary | ICD-10-CM | POA: Diagnosis present

## 2023-06-17 DIAGNOSIS — I2489 Other forms of acute ischemic heart disease: Secondary | ICD-10-CM | POA: Diagnosis present

## 2023-06-17 DIAGNOSIS — R0603 Acute respiratory distress: Principal | ICD-10-CM

## 2023-06-17 DIAGNOSIS — R6521 Severe sepsis with septic shock: Secondary | ICD-10-CM | POA: Diagnosis present

## 2023-06-17 DIAGNOSIS — R531 Weakness: Secondary | ICD-10-CM

## 2023-06-17 DIAGNOSIS — Z91018 Allergy to other foods: Secondary | ICD-10-CM

## 2023-06-17 DIAGNOSIS — J690 Pneumonitis due to inhalation of food and vomit: Secondary | ICD-10-CM | POA: Diagnosis present

## 2023-06-17 DIAGNOSIS — R7402 Elevation of levels of lactic acid dehydrogenase (LDH): Secondary | ICD-10-CM

## 2023-06-17 DIAGNOSIS — A419 Sepsis, unspecified organism: Principal | ICD-10-CM | POA: Diagnosis present

## 2023-06-17 DIAGNOSIS — J1001 Influenza due to other identified influenza virus with the same other identified influenza virus pneumonia: Secondary | ICD-10-CM | POA: Diagnosis present

## 2023-06-17 DIAGNOSIS — N1831 Chronic kidney disease, stage 3a: Secondary | ICD-10-CM | POA: Diagnosis present

## 2023-06-17 DIAGNOSIS — E871 Hypo-osmolality and hyponatremia: Secondary | ICD-10-CM | POA: Diagnosis present

## 2023-06-17 DIAGNOSIS — I130 Hypertensive heart and chronic kidney disease with heart failure and stage 1 through stage 4 chronic kidney disease, or unspecified chronic kidney disease: Secondary | ICD-10-CM | POA: Diagnosis present

## 2023-06-17 DIAGNOSIS — J189 Pneumonia, unspecified organism: Secondary | ICD-10-CM

## 2023-06-17 DIAGNOSIS — R0902 Hypoxemia: Secondary | ICD-10-CM

## 2023-06-17 DIAGNOSIS — J9601 Acute respiratory failure with hypoxia: Secondary | ICD-10-CM

## 2023-06-17 DIAGNOSIS — I10 Essential (primary) hypertension: Secondary | ICD-10-CM

## 2023-06-17 DIAGNOSIS — Z79899 Other long term (current) drug therapy: Secondary | ICD-10-CM

## 2023-06-17 DIAGNOSIS — D696 Thrombocytopenia, unspecified: Secondary | ICD-10-CM | POA: Diagnosis present

## 2023-06-17 DIAGNOSIS — J9611 Chronic respiratory failure with hypoxia: Secondary | ICD-10-CM | POA: Diagnosis present

## 2023-06-17 DIAGNOSIS — E876 Hypokalemia: Secondary | ICD-10-CM | POA: Diagnosis not present

## 2023-06-17 HISTORY — DX: Influenza due to other identified influenza virus with other respiratory manifestations: J10.1

## 2023-06-17 HISTORY — DX: Acute respiratory failure with hypoxia: J96.01

## 2023-06-17 HISTORY — DX: Acute kidney failure, unspecified: N17.9

## 2023-06-17 HISTORY — DX: Acute respiratory distress syndrome: J80

## 2023-06-17 LAB — URINALYSIS, W/ REFLEX TO CULTURE (INFECTION SUSPECTED)
Bacteria, UA: NONE SEEN
Bilirubin Urine: NEGATIVE
Glucose, UA: >=500 mg/dL — AB
Ketones, ur: NEGATIVE mg/dL
Leukocytes,Ua: NEGATIVE
Nitrite: NEGATIVE
Protein, ur: 30 mg/dL — AB
Specific Gravity, Urine: 1.01 (ref 1.005–1.030)
pH: 5 (ref 5.0–8.0)

## 2023-06-17 LAB — RESPIRATORY PANEL BY PCR

## 2023-06-17 LAB — CBC WITH DIFFERENTIAL/PLATELET
Abs Immature Granulocytes: 0.06 10*3/uL (ref 0.00–0.07)
Basophils Absolute: 0 10*3/uL (ref 0.0–0.1)
Basophils Relative: 0 %
Eosinophils Absolute: 0 10*3/uL (ref 0.0–0.5)
Eosinophils Relative: 0 %
HCT: 37.4 % — ABNORMAL LOW (ref 39.0–52.0)
Hemoglobin: 11.6 g/dL — ABNORMAL LOW (ref 13.0–17.0)
Immature Granulocytes: 1 %
Lymphocytes Relative: 4 %
Lymphs Abs: 0.5 10*3/uL — ABNORMAL LOW (ref 0.7–4.0)
MCH: 22.7 pg — ABNORMAL LOW (ref 26.0–34.0)
MCHC: 31 g/dL (ref 30.0–36.0)
MCV: 73.2 fL — ABNORMAL LOW (ref 80.0–100.0)
Monocytes Absolute: 0.3 10*3/uL (ref 0.1–1.0)
Monocytes Relative: 3 %
Neutro Abs: 12.1 10*3/uL — ABNORMAL HIGH (ref 1.7–7.7)
Neutrophils Relative %: 92 %
Platelets: 133 10*3/uL — ABNORMAL LOW (ref 150–400)
RBC: 5.11 MIL/uL (ref 4.22–5.81)
RDW: 15.3 % (ref 11.5–15.5)
WBC: 13 10*3/uL — ABNORMAL HIGH (ref 4.0–10.5)
nRBC: 0 % (ref 0.0–0.2)

## 2023-06-17 LAB — LACTIC ACID, PLASMA
Lactic Acid, Venous: 3.4 mmol/L (ref 0.5–1.9)
Lactic Acid, Venous: 4.3 mmol/L (ref 0.5–1.9)

## 2023-06-17 LAB — COMPREHENSIVE METABOLIC PANEL
ALT: 81 U/L — ABNORMAL HIGH (ref 0–44)
AST: 242 U/L — ABNORMAL HIGH (ref 15–41)
Albumin: 3.3 g/dL — ABNORMAL LOW (ref 3.5–5.0)
Alkaline Phosphatase: 75 U/L (ref 38–126)
Anion gap: 20 — ABNORMAL HIGH (ref 5–15)
BUN: 31 mg/dL — ABNORMAL HIGH (ref 6–20)
CO2: 15 mmol/L — ABNORMAL LOW (ref 22–32)
Calcium: 8.5 mg/dL — ABNORMAL LOW (ref 8.9–10.3)
Chloride: 102 mmol/L (ref 98–111)
Creatinine, Ser: 2.51 mg/dL — ABNORMAL HIGH (ref 0.61–1.24)
GFR, Estimated: 31 mL/min — ABNORMAL LOW (ref >60)
Glucose, Bld: 363 mg/dL — ABNORMAL HIGH (ref 70–99)
Potassium: 4.3 mmol/L (ref 3.5–5.1)
Sodium: 137 mmol/L (ref 135–145)
Total Bilirubin: 1.8 mg/dL — ABNORMAL HIGH (ref 0.0–1.2)
Total Protein: 7 g/dL (ref 6.5–8.1)

## 2023-06-17 LAB — APTT: aPTT: 31 s (ref 24–36)

## 2023-06-17 LAB — PHOSPHORUS: Phosphorus: 3.2 mg/dL (ref 2.5–4.6)

## 2023-06-17 LAB — I-STAT CHEM 8, ED
BUN: 29 mg/dL — ABNORMAL HIGH (ref 6–20)
Calcium, Ion: 1.04 mmol/L — ABNORMAL LOW (ref 1.15–1.40)
Chloride: 104 mmol/L (ref 98–111)
Creatinine, Ser: 2.4 mg/dL — ABNORMAL HIGH (ref 0.61–1.24)
Glucose, Bld: 361 mg/dL — ABNORMAL HIGH (ref 70–99)
HCT: 37 % — ABNORMAL LOW (ref 39.0–52.0)
Hemoglobin: 12.6 g/dL — ABNORMAL LOW (ref 13.0–17.0)
Potassium: 4.4 mmol/L (ref 3.5–5.1)
Sodium: 137 mmol/L (ref 135–145)
TCO2: 16 mmol/L — ABNORMAL LOW (ref 22–32)

## 2023-06-17 LAB — POCT I-STAT 7, (LYTES, BLD GAS, ICA,H+H)
Acid-base deficit: 5 mmol/L — ABNORMAL HIGH (ref 0.0–2.0)
Bicarbonate: 20.6 mmol/L (ref 20.0–28.0)
Calcium, Ion: 1.14 mmol/L — ABNORMAL LOW (ref 1.15–1.40)
HCT: 30 % — ABNORMAL LOW (ref 39.0–52.0)
Hemoglobin: 10.2 g/dL — ABNORMAL LOW (ref 13.0–17.0)
O2 Saturation: 94 %
Patient temperature: 100.1
Potassium: 3.9 mmol/L (ref 3.5–5.1)
Sodium: 141 mmol/L (ref 135–145)
TCO2: 22 mmol/L (ref 22–32)
pCO2 arterial: 38.9 mm[Hg] (ref 32–48)
pH, Arterial: 7.336 — ABNORMAL LOW (ref 7.35–7.45)
pO2, Arterial: 80 mm[Hg] — ABNORMAL LOW (ref 83–108)

## 2023-06-17 LAB — GLUCOSE, CAPILLARY
Glucose-Capillary: 198 mg/dL — ABNORMAL HIGH (ref 70–99)
Glucose-Capillary: 236 mg/dL — ABNORMAL HIGH (ref 70–99)
Glucose-Capillary: 249 mg/dL — ABNORMAL HIGH (ref 70–99)
Glucose-Capillary: 259 mg/dL — ABNORMAL HIGH (ref 70–99)

## 2023-06-17 LAB — RESP PANEL BY RT-PCR (RSV, FLU A&B, COVID)  RVPGX2
Influenza A by PCR: POSITIVE — AB
Influenza B by PCR: NEGATIVE
Resp Syncytial Virus by PCR: NEGATIVE
SARS Coronavirus 2 by RT PCR: NEGATIVE

## 2023-06-17 LAB — I-STAT ARTERIAL BLOOD GAS, ED
Acid-base deficit: 10 mmol/L — ABNORMAL HIGH (ref 0.0–2.0)
Bicarbonate: 14.1 mmol/L — ABNORMAL LOW (ref 20.0–28.0)
Calcium, Ion: 1.1 mmol/L — ABNORMAL LOW (ref 1.15–1.40)
HCT: 35 % — ABNORMAL LOW (ref 39.0–52.0)
Hemoglobin: 11.9 g/dL — ABNORMAL LOW (ref 13.0–17.0)
O2 Saturation: 89 %
Potassium: 4.2 mmol/L (ref 3.5–5.1)
Sodium: 138 mmol/L (ref 135–145)
TCO2: 15 mmol/L — ABNORMAL LOW (ref 22–32)
pCO2 arterial: 24.5 mm[Hg] — ABNORMAL LOW (ref 32–48)
pH, Arterial: 7.368 (ref 7.35–7.45)
pO2, Arterial: 57 mm[Hg] — ABNORMAL LOW (ref 83–108)

## 2023-06-17 LAB — CBG MONITORING, ED: Glucose-Capillary: 376 mg/dL — ABNORMAL HIGH (ref 70–99)

## 2023-06-17 LAB — MAGNESIUM: Magnesium: 1.9 mg/dL (ref 1.7–2.4)

## 2023-06-17 LAB — TROPONIN I (HIGH SENSITIVITY)
Troponin I (High Sensitivity): 640 ng/L (ref <18)
Troponin I (High Sensitivity): 994 ng/L (ref <18)

## 2023-06-17 LAB — MRSA NEXT GEN BY PCR, NASAL: MRSA by PCR Next Gen: NOT DETECTED

## 2023-06-17 LAB — I-STAT CG4 LACTIC ACID, ED: Lactic Acid, Venous: 9.8 mmol/L (ref 0.5–1.9)

## 2023-06-17 LAB — PROCALCITONIN: Procalcitonin: 21.61 ng/mL

## 2023-06-17 LAB — PROTIME-INR
INR: 1.3 — ABNORMAL HIGH (ref 0.8–1.2)
Prothrombin Time: 16.5 s — ABNORMAL HIGH (ref 11.4–15.2)

## 2023-06-17 LAB — BRAIN NATRIURETIC PEPTIDE: B Natriuretic Peptide: 375.6 pg/mL — ABNORMAL HIGH (ref 0.0–100.0)

## 2023-06-17 MED ORDER — PROSOURCE TF20 ENFIT COMPATIBL EN LIQD
60.0000 mL | Freq: Every day | ENTERAL | Status: DC
  Administered 2023-06-17 – 2023-06-19 (×3): 60 mL
  Filled 2023-06-17 (×4): qty 60

## 2023-06-17 MED ORDER — PROPOFOL 1000 MG/100ML IV EMUL
INTRAVENOUS | Status: AC | PRN
  Administered 2023-06-17: 10 ug/kg/min via INTRAVENOUS

## 2023-06-17 MED ORDER — INSULIN GLARGINE-YFGN 100 UNIT/ML ~~LOC~~ SOLN
10.0000 [IU] | Freq: Two times a day (BID) | SUBCUTANEOUS | Status: DC
  Administered 2023-06-17 – 2023-06-18 (×4): 10 [IU] via SUBCUTANEOUS
  Filled 2023-06-17 (×7): qty 0.1

## 2023-06-17 MED ORDER — POLYETHYLENE GLYCOL 3350 17 G PO PACK: 17.0000 g | PACK | Freq: Every day | ORAL | Status: DC | PRN

## 2023-06-17 MED ORDER — SODIUM CHLORIDE 0.9 % IV SOLN
2.0000 g | INTRAVENOUS | Status: AC
  Administered 2023-06-18 – 2023-06-21 (×4): 2 g via INTRAVENOUS
  Filled 2023-06-17 (×4): qty 20

## 2023-06-17 MED ORDER — OSELTAMIVIR PHOSPHATE 30 MG PO CAPS
30.0000 mg | ORAL_CAPSULE | Freq: Two times a day (BID) | ORAL | Status: DC
  Filled 2023-06-17: qty 1

## 2023-06-17 MED ORDER — ONDANSETRON HCL 4 MG/2ML IJ SOLN
INTRAMUSCULAR | Status: AC
  Filled 2023-06-17: qty 2

## 2023-06-17 MED ORDER — ORAL CARE MOUTH RINSE
15.0000 mL | OROMUCOSAL | Status: DC
Start: 2023-06-17 — End: 2023-06-20
  Administered 2023-06-17 – 2023-06-20 (×38): 15 mL via OROMUCOSAL

## 2023-06-17 MED ORDER — DOCUSATE SODIUM 50 MG/5ML PO LIQD
100.0000 mg | Freq: Two times a day (BID) | ORAL | Status: DC
  Administered 2023-06-17 – 2023-06-19 (×5): 100 mg
  Filled 2023-06-17 (×6): qty 10

## 2023-06-17 MED ORDER — ADULT MULTIVITAMIN W/MINERALS CH
1.0000 | ORAL_TABLET | Freq: Every day | ORAL | Status: DC
  Administered 2023-06-17 – 2023-06-20 (×4): 1
  Filled 2023-06-17 (×4): qty 1

## 2023-06-17 MED ORDER — ACETAMINOPHEN 650 MG RE SUPP
650.0000 mg | Freq: Once | RECTAL | Status: AC
  Administered 2023-06-17: 650 mg via RECTAL
  Filled 2023-06-17: qty 1

## 2023-06-17 MED ORDER — FENTANYL CITRATE PF 50 MCG/ML IJ SOSY
50.0000 ug | PREFILLED_SYRINGE | INTRAMUSCULAR | Status: DC | PRN
Start: 2023-06-17 — End: 2023-06-20
  Administered 2023-06-17 (×2): 50 ug via INTRAVENOUS
  Filled 2023-06-17 (×2): qty 1

## 2023-06-17 MED ORDER — DOCUSATE SODIUM 50 MG/5ML PO LIQD: 100.0000 mg | Freq: Two times a day (BID) | ORAL | Status: DC | PRN

## 2023-06-17 MED ORDER — HEPARIN SODIUM (PORCINE) 5000 UNIT/ML IJ SOLN
5000.0000 [IU] | Freq: Three times a day (TID) | INTRAMUSCULAR | Status: DC
  Administered 2023-06-17 – 2023-06-24 (×21): 5000 [IU] via SUBCUTANEOUS
  Filled 2023-06-17 (×22): qty 1

## 2023-06-17 MED ORDER — POLYETHYLENE GLYCOL 3350 17 G PO PACK
17.0000 g | PACK | Freq: Every day | ORAL | Status: DC
  Administered 2023-06-17 – 2023-06-19 (×3): 17 g
  Filled 2023-06-17 (×3): qty 1

## 2023-06-17 MED ORDER — DOCUSATE SODIUM 100 MG PO CAPS: 100.0000 mg | ORAL_CAPSULE | Freq: Two times a day (BID) | ORAL | Status: DC | PRN

## 2023-06-17 MED ORDER — ROCURONIUM BROMIDE 10 MG/ML (PF) SYRINGE
PREFILLED_SYRINGE | INTRAVENOUS | Status: AC | PRN
  Administered 2023-06-17: 100 mg via INTRAVENOUS

## 2023-06-17 MED ORDER — PANTOPRAZOLE SODIUM 40 MG IV SOLR
40.0000 mg | Freq: Every day | INTRAVENOUS | Status: DC
  Administered 2023-06-17 – 2023-06-20 (×4): 40 mg via INTRAVENOUS
  Filled 2023-06-17 (×4): qty 10

## 2023-06-17 MED ORDER — LACTATED RINGERS IV BOLUS (SEPSIS)
1000.0000 mL | Freq: Once | INTRAVENOUS | Status: AC
  Administered 2023-06-17: 1000 mL via INTRAVENOUS

## 2023-06-17 MED ORDER — CALCIUM GLUCONATE-NACL 2-0.675 GM/100ML-% IV SOLN
2.0000 g | Freq: Once | INTRAVENOUS | Status: AC
  Administered 2023-06-17: 2000 mg via INTRAVENOUS
  Filled 2023-06-17: qty 100

## 2023-06-17 MED ORDER — OSELTAMIVIR PHOSPHATE 30 MG PO CAPS
30.0000 mg | ORAL_CAPSULE | Freq: Two times a day (BID) | ORAL | Status: DC
  Administered 2023-06-17 – 2023-06-18 (×3): 30 mg
  Filled 2023-06-17 (×3): qty 1

## 2023-06-17 MED ORDER — VITAL 1.5 CAL PO LIQD
1000.0000 mL | ORAL | Status: DC
  Administered 2023-06-17 – 2023-06-20 (×4): 1000 mL

## 2023-06-17 MED ORDER — THIAMINE MONONITRATE 100 MG PO TABS
100.0000 mg | ORAL_TABLET | Freq: Every day | ORAL | Status: DC
  Administered 2023-06-17 – 2023-06-20 (×4): 100 mg
  Filled 2023-06-17 (×4): qty 1

## 2023-06-17 MED ORDER — ORAL CARE MOUTH RINSE
15.0000 mL | OROMUCOSAL | Status: DC | PRN
Start: 2023-06-17 — End: 2023-06-23

## 2023-06-17 MED ORDER — FAMOTIDINE 20 MG PO TABS
20.0000 mg | ORAL_TABLET | Freq: Two times a day (BID) | ORAL | Status: DC
  Administered 2023-06-17: 20 mg
  Filled 2023-06-17: qty 1

## 2023-06-17 MED ORDER — NOREPINEPHRINE 4 MG/250ML-% IV SOLN
INTRAVENOUS | Status: AC
  Filled 2023-06-17: qty 250

## 2023-06-17 MED ORDER — FAMOTIDINE 20 MG PO TABS
20.0000 mg | ORAL_TABLET | Freq: Every day | ORAL | Status: DC
  Administered 2023-06-18 – 2023-06-19 (×2): 20 mg
  Filled 2023-06-17 (×3): qty 1

## 2023-06-17 MED ORDER — ONDANSETRON HCL 4 MG/2ML IJ SOLN
4.0000 mg | Freq: Once | INTRAMUSCULAR | Status: AC
  Administered 2023-06-17: 4 mg via INTRAVENOUS

## 2023-06-17 MED ORDER — INSULIN ASPART 100 UNIT/ML IJ SOLN
0.0000 [IU] | INTRAMUSCULAR | Status: DC
  Administered 2023-06-17 (×2): 5 [IU] via SUBCUTANEOUS
  Administered 2023-06-17: 8 [IU] via SUBCUTANEOUS
  Administered 2023-06-18: 5 [IU] via SUBCUTANEOUS
  Administered 2023-06-18 (×3): 3 [IU] via SUBCUTANEOUS
  Administered 2023-06-18: 5 [IU] via SUBCUTANEOUS
  Administered 2023-06-18 – 2023-06-19 (×3): 3 [IU] via SUBCUTANEOUS
  Administered 2023-06-19: 5 [IU] via SUBCUTANEOUS
  Administered 2023-06-19: 8 [IU] via SUBCUTANEOUS
  Administered 2023-06-19: 5 [IU] via SUBCUTANEOUS
  Administered 2023-06-19: 3 [IU] via SUBCUTANEOUS
  Administered 2023-06-19: 5 [IU] via SUBCUTANEOUS
  Administered 2023-06-20: 3 [IU] via SUBCUTANEOUS
  Administered 2023-06-20 (×3): 5 [IU] via SUBCUTANEOUS
  Administered 2023-06-20: 3 [IU] via SUBCUTANEOUS
  Administered 2023-06-21: 2 [IU] via SUBCUTANEOUS
  Administered 2023-06-21 (×2): 3 [IU] via SUBCUTANEOUS

## 2023-06-17 MED ORDER — FENTANYL 2500MCG IN NS 250ML (10MCG/ML) PREMIX INFUSION
0.0000 ug/h | INTRAVENOUS | Status: DC
  Administered 2023-06-17: 50 ug/h via INTRAVENOUS
  Filled 2023-06-17: qty 250

## 2023-06-17 MED ORDER — FUROSEMIDE 10 MG/ML IJ SOLN
80.0000 mg | Freq: Once | INTRAMUSCULAR | Status: AC
  Administered 2023-06-17: 80 mg via INTRAVENOUS
  Filled 2023-06-17: qty 8

## 2023-06-17 MED ORDER — DEXMEDETOMIDINE HCL IN NACL 400 MCG/100ML IV SOLN
0.0000 ug/kg/h | INTRAVENOUS | Status: DC
  Administered 2023-06-17: 0.4 ug/kg/h via INTRAVENOUS
  Administered 2023-06-17 (×2): 1 ug/kg/h via INTRAVENOUS
  Administered 2023-06-18 (×2): 0.6 ug/kg/h via INTRAVENOUS
  Administered 2023-06-18 (×2): 1 ug/kg/h via INTRAVENOUS
  Administered 2023-06-19 – 2023-06-20 (×3): 0.6 ug/kg/h via INTRAVENOUS
  Filled 2023-06-17 (×11): qty 100

## 2023-06-17 MED ORDER — ETOMIDATE 2 MG/ML IV SOLN
INTRAVENOUS | Status: AC | PRN
  Administered 2023-06-17: 20 mg via INTRAVENOUS

## 2023-06-17 MED ORDER — SODIUM CHLORIDE 0.9 % IV SOLN
500.0000 mg | INTRAVENOUS | Status: AC
Start: 2023-06-18 — End: 2023-06-21
  Administered 2023-06-18 – 2023-06-21 (×4): 500 mg via INTRAVENOUS
  Filled 2023-06-17 (×4): qty 5

## 2023-06-17 MED ORDER — FOLIC ACID 1 MG PO TABS
1.0000 mg | ORAL_TABLET | Freq: Every day | ORAL | Status: DC
  Administered 2023-06-17 – 2023-06-20 (×4): 1 mg
  Filled 2023-06-17 (×4): qty 1

## 2023-06-17 MED ORDER — FENTANYL CITRATE PF 50 MCG/ML IJ SOSY
50.0000 ug | PREFILLED_SYRINGE | INTRAMUSCULAR | Status: DC | PRN
Start: 2023-06-17 — End: 2023-06-20

## 2023-06-17 MED ORDER — CHLORHEXIDINE GLUCONATE CLOTH 2 % EX PADS
6.0000 | MEDICATED_PAD | Freq: Every day | CUTANEOUS | Status: DC
  Administered 2023-06-17 – 2023-06-23 (×7): 6 via TOPICAL

## 2023-06-17 MED ORDER — LACTATED RINGERS IV BOLUS (SEPSIS)
500.0000 mL | Freq: Once | INTRAVENOUS | Status: AC
  Administered 2023-06-17: 500 mL via INTRAVENOUS

## 2023-06-17 MED ORDER — SODIUM CHLORIDE 0.9 % IV SOLN
2.0000 g | Freq: Once | INTRAVENOUS | Status: AC
  Administered 2023-06-17: 2 g via INTRAVENOUS
  Filled 2023-06-17: qty 20

## 2023-06-17 MED ORDER — OSELTAMIVIR PHOSPHATE 75 MG PO CAPS
75.0000 mg | ORAL_CAPSULE | Freq: Once | ORAL | Status: AC
  Administered 2023-06-17: 75 mg via ORAL
  Filled 2023-06-17: qty 1

## 2023-06-17 MED ORDER — LACTATED RINGERS IV SOLN
INTRAVENOUS | Status: DC
Start: 2023-06-17 — End: 2023-06-17

## 2023-06-17 MED ORDER — NOREPINEPHRINE 4 MG/250ML-% IV SOLN
0.0000 ug/min | INTRAVENOUS | Status: DC
  Administered 2023-06-17: 7 ug/min via INTRAVENOUS
  Administered 2023-06-17: 2 ug/min via INTRAVENOUS
  Administered 2023-06-18: 5 ug/min via INTRAVENOUS
  Administered 2023-06-19: 4 ug/min via INTRAVENOUS
  Filled 2023-06-17 (×4): qty 250

## 2023-06-17 MED ORDER — SODIUM CHLORIDE 0.9 % IV SOLN
500.0000 mg | Freq: Once | INTRAVENOUS | Status: AC
  Administered 2023-06-17: 500 mg via INTRAVENOUS
  Filled 2023-06-17: qty 5

## 2023-06-17 MED ORDER — SODIUM CHLORIDE 0.9 % IV SOLN: 250.0000 mL | INTRAVENOUS | Status: AC

## 2023-06-17 NOTE — Sepsis Progress Note (Signed)
Code Sepsis protocol being monitored by eLink. 

## 2023-06-17 NOTE — Sepsis Progress Note (Addendum)
Blood cultures drawn have not populated in [facility] chart yet. Bedside ED RN reported 1 set by lab and 2nd set by RN were obtained prior to antibiotics. Initial lactic acid 9.8. Pt has been intubated. Has received adequate fluid resuscitation based on 88ml/kg. Transferring to 28M. Will ask bedside to repeat lactic acid as soon as able.

## 2023-06-17 NOTE — Progress Notes (Signed)
   06/17/23 0915  Vent Select  $ Ventilator Initial/Subsequent  Initial  Invasive or Noninvasive Noninvasive  Adult Vent Y  Adult Ventilator Settings  Vent Type Servo i  Humidity HME  Vent Mode Bi-Vent;PCV  Set Rate 18 bmp  FiO2 (%) 100 %  IPAP 12 cmH20  EPAP 6 cmH20  Pressure Control 6 cmH20  PEEP 6 cmH20  Adult Ventilator Measurements  Peak Airway Pressure 20 L/min  Mean Airway Pressure 8 cmH20  Resp Rate Spontaneous 21 br/min  Resp Rate Total 39 br/min  Exhaled Vt 760 mL  Measured Ve 29 L  I:E Ratio Measured 1:1.4  Auto PEEP 0 cmH20  Total PEEP 6 cmH20  SpO2 94 %  Adult Ventilator Alarms  Alarms On Y  Ve High Alarm 21 L/min  Ve Low Alarm 4 L/min  Resp Rate High Alarm 38 br/min  Resp Rate Low Alarm 8  PEEP Low Alarm 3 cmH2O  Press High Alarm 25 cmH2O  T Apnea 25 sec(s)  VAP Prevention  HOB> 30 Degrees Y  Equipment wiped down Yes  Vent Respiratory Assessment  Level of Consciousness Alert  Respiratory Pattern Tachypnea;Dyspnea at rest     Pt BIB EMS on CPAP. Pt placed on 12/6 on BIPAP per MD order. Pt's vitals stable and is tolerating well. RT will monitor.

## 2023-06-17 NOTE — ED Provider Notes (Signed)
I provided a substantive portion of the care of this patient.  I personally made/approved the management plan for this patient and take responsibility for the patient management.  EKG Interpretation Date/Time:  Monday June 17 2023 09:11:46 EST Ventricular Rate:  145 PR Interval:  114 QRS Duration:  78 QT Interval:  321 QTC Calculation: 499 R Axis:   68  Text Interpretation: Sinus tachycardia Left atrial enlargement Borderline repolarization abnormality Borderline prolonged QT interval Confirmed by Margarita Grizzle [phone]) on 06/17/2023 9:14:67 AM    47 year old presents with acute shortness of breath.  His images were 3.3.  Chest x-ray concerning for pneumonia.  Code sepsis initiated.  Given IV fluid as well as antibiotics.  EKG per interpretation shows sinus tachycardia.  He is currently on BiPAP.  Will admit to ICU   Lorre Nick, MD 06/17/23 [phone]

## 2023-06-17 NOTE — Progress Notes (Signed)
Initial Nutrition Assessment  DOCUMENTATION CODES:   Not applicable  INTERVENTION:  Enteral nutrition via OG-tube: Start Vital 1.5 at 25 mL/hr and advance by 10 mL every 6 hours to goal rate of 55 mL/hr (1320 mL per day) 60 mL ProSource TF20 - Daily  Tube feeds at goal provides 2060 kcal, 109 gm protein., and 1008 mL free water daily.  Continue folic acid, thiamine, and MVI daily  NUTRITION DIAGNOSIS:  Inadequate oral intake related to inability to eat as evidenced by NPO status.  GOAL:  Patient will meet greater than or equal to 90% of their needs  MONITOR:  Diet advancement, Vent status, Labs, I & O's, TF tolerance  REASON FOR ASSESSMENT:  Consult, Ventilator Enteral/tube feeding initiation and management  ASSESSMENT:  48 y.o. male presented to the ED with SOB. PMH includes HTN, DM, and DAH benign spinal cord tumor. Pt admitted with sepsis, Flu A PNA, severe ARDS, and AKI.   1/20 - Admitted; Intubated  Patient is currently intubated on ventilator support. No family at bedside at time of RD visit. Limited weight history within EMR.  MV: 15.9 L/min MAP (cuff): 57 Temp (24hrs), Avg:100.2 F (37.9 C), Min:97 F (36.1 C), Max:103.3 F (39.6 C)  Drips Precedex Levophed  Medications reviewed and include: Colace, Pepcid, Folic acid, Novolog SSI, Semglee, MVI, Protonix, Miralax, Thiamine Labs reviewed: Sodium 141, Potassium 3.9, BUN 29, Creatine 2.40 CBG: 249-376  NUTRITION - FOCUSED PHYSICAL EXAM:  Flowsheet Row Most Recent Value  Orbital Region No depletion  Upper Arm Region No depletion  Thoracic and Lumbar Region No depletion  Buccal Region No depletion  Temple Region No depletion  Clavicle Bone Region No depletion  Clavicle and Acromion Bone Region No depletion  Scapular Bone Region Unable to assess  Dorsal Hand No depletion  Patellar Region No depletion  Anterior Thigh Region No depletion  Posterior Calf Region No depletion  Edema (RD Assessment) Unable  to assess  Hair Reviewed  Eyes Unable to assess  Mouth Unable to assess  Skin Reviewed  Nails Unable to assess   Diet Order:   Diet Order             Diet NPO time specified  Diet effective now                  EDUCATION NEEDS:  Not appropriate for education at this time  Skin:  Skin Assessment: Reviewed RN Assessment  Last BM:  PTA  Height:  Ht Readings from Last 1 Encounters:  06/17/23 5\' 8"  (1.727 m)   Weight:  Wt Readings from Last 1 Encounters:  06/17/23 80 kg   Ideal Body Weight:  70 kg  BMI:  Body mass index is 26.82 kg/m.  Estimated Nutritional Needs:  Kcal:  1900-2100 Protein:  95-115 grams Fluid:  >/= 1.9 L   Kirby Crigler RD, LDN Clinical Dietitian

## 2023-06-17 NOTE — ED Notes (Signed)
Crit Care NP at bedside

## 2023-06-17 NOTE — Procedures (Signed)
Intubation Procedure Note  Gary Conway  [id number]  1975/10/11  Date:06/17/23  Time:10:52 AM   Provider Performing:Kayelee Herbig E  [name]    Procedure: Intubation (31500)  Indication(s) Respiratory Failure  Consent Unable to obtain consent due to emergent nature of procedure.   Anesthesia Etomidate and Rocuronium   Time Out Verified patient identification, verified procedure, site/side was marked, verified correct patient position, special equipment/implants available, medications/allergies/relevant history reviewed, required imaging and test results available.   Sterile Technique Usual hand hygeine, masks, and gloves were used   Procedure Description Patient positioned in bed supine.  Sedation given as noted above.  Patient was intubated with endotracheal tube using Glidescope.  View was Grade 1 full glottis .  Number of attempts was 1.  Colorimetric CO2 detector was consistent with tracheal placement.   Complications/Tolerance None; patient tolerated the procedure well. Chest X-ray is ordered to verify placement.   EBL None    Specimen(s) None   [name], PA-C [facility] 06/17/23 10:52 AM  Please see [email or web address] for pager details.  From 7A-7P if no response, please call [phone] After hours, please call ELink [phone]

## 2023-06-17 NOTE — ED Provider Notes (Signed)
[facility] AT [facility] Provider Note   CSN: [id number] Arrival date & time: 06/17/23  8469     History  Chief Complaint  Patient presents with   Shortness of Breath    Gary Conway is a 48 y.o. male.  Patient brought in by [facility] with a chief complaint of shortness of breath.  Patient is reported to have had a fever and a cough.  EMS reports patient does not have any history of congestive heart failure or asthma.  They report roommate reported patient has a history of diabetes.  The history is provided by the EMS personnel.  Shortness of Breath Severity:  Severe Progression:  Worsening Chronicity:  New      Home Medications Prior to Admission medications   Medication Sig Start Date End Date Taking? Authorizing Provider  blood glucose meter kit and supplies KIT Dispense based on patient and insurance preference. Use up to four times daily as directed. (FOR ICD-9 250.00, 250.01). For QAC - HS accuchecks. 09/14/19   Leroy Sea, MD  Blood Glucose Monitoring Suppl (BLOOD GLUCOSE MONITOR SYSTEM) w/Device KIT Use to test blood sugar in the morning, at noon, and at bedtime. 04/08/23   Leroy Sea, MD  carvedilol (COREG) 12.5 MG tablet Take 1 tablet (12.5 mg total) by mouth 2 (two) times daily with a meal. 04/08/23   Leroy Sea, MD  ferrous sulfate 325 (65 FE) MG tablet Take 1 tablet (325 mg total) by mouth daily with breakfast. 04/08/23   Leroy Sea, MD  furosemide (LASIX) 80 MG tablet Take 1 tablet (80 mg total) by mouth daily. 04/08/23   Leroy Sea, MD  insulin glargine (LANTUS SOLOSTAR) 100 UNIT/ML Solostar Pen Inject 35 Units into the skin daily. 04/08/23   Leroy Sea, MD  insulin lispro (HUMALOG) 100 UNIT/ML KwikPen Before each meal, inject subcutaneously 3 times a day:  140-199 - 2 units, 200-250 - 6 units, 251-299 - 8 units,  300-349 - 10 units,  350 or above 14 units. 04/08/23   Leroy Sea, MD  Insulin Pen Needle 32G X 4 MM MISC For 4 times a day insulin Subcutaneously. 04/08/23   Leroy Sea, MD  ipratropium-albuterol (DUONEB) 0.5-2.5 (3) MG/3ML SOLN Use twice a day scheduled and every 4 hours as needed for shortness of breath and wheezing. 04/08/23   Leroy Sea, MD      Allergies    Other    Review of Systems   Review of Systems  Unable to perform ROS: Acuity of condition  Respiratory:  Positive for shortness of breath.     Physical Exam Updated Vital Signs BP (!) 150/93 (BP Location: Right Arm)   Pulse (!) 150   Temp (!) 103.3 F (39.6 C) (Axillary)   Resp (!) 40   Wt 80 kg   SpO2 (!) 86%   BMI 26.82 kg/m  Physical Exam Vitals and nursing note reviewed.  Constitutional:      General: He is in acute distress.     Appearance: He is ill-appearing.  HENT:     Head: Normocephalic.  Cardiovascular:     Rate and Rhythm: Tachycardia present.  Pulmonary:     Effort: Respiratory distress present.     Breath sounds: Decreased breath sounds present.  Abdominal:     General: There is no distension.  Musculoskeletal:        General: Normal range of motion.  Cervical back: Normal range of motion.  Skin:    General: Skin is warm.  Neurological:     General: No focal deficit present.     Mental Status: He is oriented to person, place, and time.  Psychiatric:        Mood and Affect: Mood normal.     ED Results / Procedures / Treatments   Labs (all labs ordered are listed, but only abnormal results are displayed) Labs Reviewed  I-STAT CHEM 8, ED - Abnormal; Notable for the following components:      Result Value   BUN 29 (*)    Creatinine, Ser 2.40 (*)    Glucose, Bld 361 (*)    Calcium, Ion 1.04 (*)    TCO2 16 (*)    Hemoglobin 12.6 (*)    HCT 37.0 (*)    All other components within normal limits  I-STAT ARTERIAL BLOOD GAS, ED - Abnormal; Notable for the following components:   pCO2 arterial 24.5 (*)    pO2, Arterial 57  (*)    Bicarbonate 14.1 (*)    TCO2 15 (*)    Acid-base deficit 10.0 (*)    Calcium, Ion 1.10 (*)    HCT 35.0 (*)    Hemoglobin 11.9 (*)    All other components within normal limits  RESP PANEL BY RT-PCR (RSV, FLU A&B, COVID)  RVPGX2  CULTURE, BLOOD (ROUTINE X 2)  CULTURE, BLOOD (ROUTINE X 2)  COMPREHENSIVE METABOLIC PANEL  CBC WITH DIFFERENTIAL/PLATELET  PROTIME-INR  APTT  URINALYSIS, W/ REFLEX TO CULTURE (INFECTION SUSPECTED)  I-STAT CG4 LACTIC ACID, ED  TROPONIN I (HIGH SENSITIVITY)    EKG EKG Interpretation Date/Time:  Monday June 17 2023 09:11:46 EST Ventricular Rate:  145 PR Interval:  114 QRS Duration:  78 QT Interval:  321 QTC Calculation: 499 R Axis:   68  Text Interpretation: Sinus tachycardia Left atrial enlargement Borderline repolarization abnormality Borderline prolonged QT interval Confirmed by Margarita Grizzle [phone]) on 06/17/2023 9:14:03 AM  Radiology DG Chest Port 1 View Result Date: 06/17/2023 CLINICAL DATA:  Shortness of breath EXAM: PORTABLE CHEST 1 VIEW COMPARISON:  03/29/2023 FINDINGS: Low volume chest with bilateral airspace disease similar to prior. There is asymmetric elevation of the right diaphragm. Normal heart size and mediastinal contours. No visible effusion or pneumothorax. Artifact from EKG leads. IMPRESSION: Low volume chest with bilateral airspace disease similar to 03/31/2023. Electronically Signed   By: Tiburcio Pea M.D.   On: 06/17/2023 09:20    Procedures .Critical Care  Performed by: Elson Areas, PA-C Authorized by: Elson Areas, PA-C   Critical care provider statement:    Critical care time (minutes):  45   Critical care start time:  06/17/2023 8:00 AM   Critical care end time:  06/17/2023 10:47 AM   Critical care time was exclusive of:  Separately billable procedures and treating other patients and teaching time   Critical care was necessary to treat or prevent imminent or life-threatening deterioration of the following  conditions:  Cardiac failure, circulatory failure, dehydration and sepsis   Critical care was time spent personally by me on the following activities:  Blood draw for specimens, development of treatment plan with patient or surrogate, discussions with consultants, discussions with primary provider, evaluation of patient's response to treatment, examination of patient, interpretation of cardiac output measurements, obtaining history from patient or surrogate, ordering and performing treatments and interventions, ordering and review of laboratory studies, ordering and review of radiographic studies, pulse oximetry, re-evaluation  of patient's condition and review of old charts   I assumed direction of critical care for this patient from another provider in my specialty: no     Care discussed with: admitting provider   Comments:     Critical care Consulted and will see for admission      Medications Ordered in ED Medications  lactated ringers infusion (has no administration in time range)  lactated ringers bolus 1,000 mL (1,000 mLs Intravenous New Bag/Given 06/17/23 0921)    And  lactated ringers bolus 1,000 mL (has no administration in time range)    And  lactated ringers bolus 500 mL (has no administration in time range)  cefTRIAXone (ROCEPHIN) 2 g in sodium chloride 0.9 % 100 mL IVPB (2 g Intravenous New Bag/Given 06/17/23 0924)  azithromycin (ZITHROMAX) 500 mg in sodium chloride 0.9 % 250 mL IVPB (has no administration in time range)  acetaminophen (TYLENOL) suppository 650 mg (has no administration in time range)  ondansetron (ZOFRAN) injection 4 mg (has no administration in time range)    ED Course/ Medical Decision Making/ A&P                                 Medical Decision Making Patient brought in by [facility] due to fever and respiratory distress  Amount and/or Complexity of Data Reviewed Independent Historian: EMS    Details: History is provided by EMS and old  records External Data Reviewed: labs, radiology and notes.    Details: Hospitalist notes reviewed from October 24 Labs: ordered. Decision-making details documented in ED Course.    Details: Labs ordered reviewed and interpreted.  Patient is a lactic acid of 9.  pO2 was 57.  pH is 7.368 Radiology: ordered and independent interpretation performed. Decision-making details documented in ED Course.    Details: Chest x-ray shows lung disease ECG/medicine tests: ordered and independent interpretation performed. Decision-making details documented in ED Course.    Details: EKG tachycardic Discussion of management or test interpretation with external provider(s): Critical care intensivist consulted.  I spoke to Tessie Fass, NP.  Critical care will see here and evaluate for admission  Risk Decision regarding hospitalization.  Critical Care Total time providing critical care: 45 minutes           Final Clinical Impression(s) / ED Diagnoses Final diagnoses:  Respiratory distress  Hypoxia  Pneumonia of both lungs due to infectious organism, unspecified part of lung  Sepsis, due to unspecified organism, unspecified whether acute organ dysfunction present [facility])    Rx / DC Orders ED Discharge Orders     None         Osie Cheeks 06/17/23 1052    Lorre Nick, MD 06/19/23 1211

## 2023-06-17 NOTE — Progress Notes (Signed)
Pt transported on vent to 3M10 from Trauma A without complications. RT will monitor.

## 2023-06-17 NOTE — H&P (Signed)
NAME:  Gary Conway, MRN:  [id number], DOB:  20-Dec-1975, LOS: 0 ADMISSION DATE:  06/17/2023, CONSULTATION DATE:  06/17/23 REFERRING MD:  Freida Busman - EM, CHIEF COMPLAINT:  resp failure    History of Present Illness:  Hx from chart review due to degree of resp failure 48 yo M PMH recent DAH (10-03/2023) benign spinal cord tumor, DM, HTN presented to ED 1/20 via EMS for SOB. Onset unclear. With EMS he was hypoxic and SOB and placed on CPAP. Vomited in ED, remained hypoxic and placed on BiPAP  PCCM consulted in this setting  Only some Prelim labs are starting to result at time of consultation.    Pertinent  Medical History  HTN DAH DM  ARDS  Significant Hospital Events: Including procedures, antibiotic start and stop dates in addition to other pertinent events   1/20 to ED via EMS   Interim History / Subjective:  Vomited  Now on BiPAP with RR improved from 50s to 30s   Objective   Blood pressure (!) 176/137, pulse (!) 152, temperature (!) 103.3 F (39.6 C), temperature source Axillary, resp. rate (!) 27, height 5\' 8"  (1.727 m), weight 80 kg, SpO2 95%.    Vent Mode: PRVC FiO2 (%):  [100 %] 100 % Set Rate:  [18 bmp-30 bmp] 30 bmp Vt Set:  [540 mL] 540 mL PEEP:  [6 cmH20-12 cmH20] 12 cmH20 Plateau Pressure:  [24 cmH20] 24 cmH20   Intake/Output Summary (Last 24 hours) at 06/17/2023 1212 Last data filed at 06/17/2023 1056 Gross per 24 hour  Intake 1100 ml  Output --  Net 1100 ml   Filed Weights   06/17/23 0903  Weight: 80 kg    Examination: General: Ill appearing wdwn middle aged M in distress  HENT: BiPAP in place with good seal  Lungs: Incr RR. No accessory use. Lung sounds difficult to hear over BiPAP sounds  Cardiovascular: tachycardic s1s2 cap refill is brisk  Abdomen: soft round  Extremities: no acute joint deformity Neuro: Awake following commands  GU: defer   Resolved Hospital Problem list     Assessment & Plan:   Sepsis -- Flu A PNA P -flu as  below -follow up cx data otherwise   Acute respiratory failure with hypoxia Flu A PNA Severe ARDS  -does have Hx DAH 2/2 infectious alveolitis vs pulm renal syndrome.  -Cxrf personally reviewed, multifocal bilat ASD P -intubate in ED -CXR -lung protective ventilation  -tamiflu -RVP is pending, trach asp is pending  -with known Flu PNA, ok to defer further abx  -trying to find the results of his kidney bx from last admission but can't locate   Lactic acidosis  P -follow   AKI  AGMA  P -follow renal indices UOP   HTN  P -will defer adding antiHTN meds while he equilibrates with sedation   Thrombocytopenia  Coagulopathy  P -follow CBC PRN   DM2 with hyperglycemia  P -SSI     We talked about possibility that he may require intubation if his resp status does not improve and he understands this. (Has since required intubation). Full Code, full scope of offered care    Best Practice (right click and "Reselect all SmartList Selections" daily)   Diet/type: tubefeeds DVT prophylaxis prophylactic heparin  Pressure ulcer(s): pressure ulcer assessment deferred  GI prophylaxis: PPI Lines: N/A Foley:  N/A Code Status:  full code Last date of multidisciplinary goals of care discussion [1/20]  Labs   CBC: Recent Labs  Lab 06/17/23  1610 06/17/23 0915 06/17/23 1202  WBC 13.0*  --   --   NEUTROABS 12.1*  --   --   HGB 11.6*  12.6* 11.9* 10.2*  HCT 37.4*  37.0* 35.0* 30.0*  MCV 73.2*  --   --   PLT 133*  --   --     Basic Metabolic Panel: Recent Labs  Lab 06/17/23 0911 06/17/23 0915 06/17/23 1202  NA 137  137 138 141  K 4.3  4.4 4.2 3.9  CL 102  104  --   --   CO2 15*  --   --   GLUCOSE 363*  361*  --   --   BUN 31*  29*  --   --   CREATININE 2.51*  2.40*  --   --   CALCIUM 8.5*  --   --    GFR: Estimated Creatinine Clearance: 34.4 mL/min (A) (by C-G formula based on SCr of 2.51 mg/dL (H)). Recent Labs  Lab 06/17/23 0911  WBC 13.0*   LATICACIDVEN 9.8*    Liver Function Tests: Recent Labs  Lab 06/17/23 0911  AST 242*  ALT 81*  ALKPHOS 75  BILITOT 1.8*  PROT 7.0  ALBUMIN 3.3*   No results for input(s): "LIPASE", "AMYLASE" in the last 168 hours. No results for input(s): "AMMONIA" in the last 168 hours.  ABG    Component Value Date/Time   PHART 7.336 (L) 06/17/2023 1202   PCO2ART 38.9 06/17/2023 1202   PO2ART 80 (L) 06/17/2023 1202   HCO3 20.6 06/17/2023 1202   TCO2 22 06/17/2023 1202   ACIDBASEDEF 5.0 (H) 06/17/2023 1202   O2SAT 94 06/17/2023 1202     Coagulation Profile: Recent Labs  Lab 06/17/23 0911  INR 1.3*    Cardiac Enzymes: No results for input(s): "CKTOTAL", "CKMB", "CKMBINDEX", "TROPONINI" in the last 168 hours.  HbA1C: Hgb A1c MFr Bld  Date/Time Value Ref Range Status  03/22/2023 06:06 PM 8.1 (H) 4.8 - 5.6 % Final    Comment:    (NOTE) Pre diabetes:          5.7%-6.4%  Diabetes:              >6.4%  Glycemic control for   <7.0% adults with diabetes   09/13/2019 02:26 AM 12.9 (H) 4.8 - 5.6 % Final    Comment:    (NOTE) Pre diabetes:          5.7%-6.4% Diabetes:              >6.4% Glycemic control for   <7.0% adults with diabetes     CBG: Recent Labs  Lab 06/17/23 0903 06/17/23 1159  GLUCAP 376* 249*    Review of Systems:   Unable to obtain 2/2 pt distress   Past Medical History:  He,  has a past medical history of Diabetes mellitus, type II (HCC) (2015) and Hemangioma of spine (2015).   Surgical History:   Past Surgical History:  Procedure Laterality Date   LAMINECTOMY N/A 03/22/2014   Procedure: THORACIC two - four  LAMINECTOMY FOR TUMOR;  Surgeon: Temple Pacini, MD;  Location: [facility];  Service: Neurosurgery;  Laterality: N/A;  Throacic two  - four laminectomy for tumor   LEG SURGERY       Social History:   reports that he has never smoked. He has never used smokeless tobacco. He reports current alcohol use. He reports that he does not use drugs.    Family History:  His  family history includes Breast cancer in his mother.   Allergies Allergies  Allergen Reactions   Other Hives and Itching    Ketchup & Mustard     Home Medications  Prior to Admission medications   Medication Sig Start Date End Date Taking? Authorizing Provider  blood glucose meter kit and supplies KIT Dispense based on patient and insurance preference. Use up to four times daily as directed. (FOR ICD-9 250.00, 250.01). For QAC - HS accuchecks. 09/14/19   Leroy Sea, MD  Blood Glucose Monitoring Suppl (BLOOD GLUCOSE MONITOR SYSTEM) w/Device KIT Use to test blood sugar in the morning, at noon, and at bedtime. 04/08/23   Leroy Sea, MD  carvedilol (COREG) 12.5 MG tablet Take 1 tablet (12.5 mg total) by mouth 2 (two) times daily with a meal. 04/08/23   Leroy Sea, MD  ferrous sulfate 325 (65 FE) MG tablet Take 1 tablet (325 mg total) by mouth daily with breakfast. 04/08/23   Leroy Sea, MD  furosemide (LASIX) 80 MG tablet Take 1 tablet (80 mg total) by mouth daily. 04/08/23   Leroy Sea, MD  insulin glargine (LANTUS SOLOSTAR) 100 UNIT/ML Solostar Pen Inject 35 Units into the skin daily. 04/08/23   Leroy Sea, MD  insulin lispro (HUMALOG) 100 UNIT/ML KwikPen Before each meal, inject subcutaneously 3 times a day:  140-199 - 2 units, 200-250 - 6 units, 251-299 - 8 units,  300-349 - 10 units,  350 or above 14 units. 04/08/23   Leroy Sea, MD  Insulin Pen Needle 32G X 4 MM MISC For 4 times a day insulin Subcutaneously. 04/08/23   Leroy Sea, MD  ipratropium-albuterol (DUONEB) 0.5-2.5 (3) MG/3ML SOLN Use twice a day scheduled and every 4 hours as needed for shortness of breath and wheezing. 04/08/23   Leroy Sea, MD     Critical care time: 42 min      CRITICAL CARE Performed by: Lanier Clam   Total critical care time: 42 minutes  Critical care time was exclusive of separately billable procedures and  treating other patients.  Critical care was necessary to treat or prevent imminent or life-threatening deterioration.  Critical care was time spent personally by me on the following activities: development of treatment plan with patient and/or surrogate as well as nursing, discussions with consultants, evaluation of patient's response to treatment, examination of patient, obtaining history from patient or surrogate, ordering and performing treatments and interventions, ordering and review of laboratory studies, ordering and review of radiographic studies, pulse oximetry and re-evaluation of patient's condition.  Tessie Fass MSN, AGACNP-BC [facility] Pulmonary/Critical Care Medicine Amion for pager  06/17/2023, 12:12 PM

## 2023-06-17 NOTE — Procedures (Signed)
Arterial Catheter Insertion Procedure Note  Gary Conway  [id number]  1975-06-07  Date:06/17/23  Time:3:23 PM    Provider Performing: [name]    Procedure: Insertion of Arterial Line (40981) with US guidance (19147)   Indication(s) Blood pressure monitoring and/or need for frequent ABGs  Consent Risks of the procedure as well as the alternatives and risks of each were explained to the patient and/or caregiver.  Consent for the procedure was obtained and is signed in the bedside chart  Anesthesia None   Time Out Verified patient identification, verified procedure, site/side was marked, verified correct patient position, special equipment/implants available, medications/allergies/relevant history reviewed, required imaging and test results available.   Sterile Technique Maximal sterile technique including full sterile barrier drape, hand hygiene, sterile gown, sterile gloves, mask, hair covering, sterile ultrasound probe cover (if used).   Procedure Description Area of catheter insertion was cleaned with chlorhexidine and draped in sterile fashion. With real-time ultrasound guidance an arterial catheter was placed into the left  axillary  artery.  Appropriate arterial tracings confirmed on monitor.     Complications/Tolerance None; patient tolerated the procedure well.   EBL Minimal   Specimen(s) None   [name], PA-C [facility] 06/17/23 3:23 PM  Please see [email or web address] for pager details.  From 7A-7P if no response, please call [phone] After hours, please call ELink [phone]

## 2023-06-17 NOTE — ED Notes (Signed)
Bilat breath sounds and color change. Portable xray called to verify intubation. 25 at lip. 7.5 et.

## 2023-06-17 NOTE — Progress Notes (Signed)
Per MD order ETT pulled back 2cm at this time.

## 2023-06-17 NOTE — Code Documentation (Addendum)
While cleaning patient, he experienced additional episode of emesis requiring bipap to be removed. CCM contacted by RT, at bedside. Zofran 4mg  given. CCM at bedside and decision made with patient to intubate.

## 2023-06-17 NOTE — ED Triage Notes (Addendum)
SOB at home x2 days. Productive cough.  1 nitroglycerin SL with EMS, placed on CPAP  EMS exam: 150ST, 190SBP, lethargic, copious pink frothy sputum, 51% RA, improved to 71% on CPAP.   H/o DM. PNA in Oct. No known h/o CHF.

## 2023-06-18 ENCOUNTER — Other Ambulatory Visit: Payer: Self-pay

## 2023-06-18 ENCOUNTER — Encounter (HOSPITAL_COMMUNITY): Payer: Self-pay

## 2023-06-18 DIAGNOSIS — R6521 Severe sepsis with septic shock: Secondary | ICD-10-CM

## 2023-06-18 DIAGNOSIS — N1832 Chronic kidney disease, stage 3b: Secondary | ICD-10-CM

## 2023-06-18 DIAGNOSIS — E1165 Type 2 diabetes mellitus with hyperglycemia: Secondary | ICD-10-CM

## 2023-06-18 DIAGNOSIS — J189 Pneumonia, unspecified organism: Secondary | ICD-10-CM

## 2023-06-18 LAB — POCT I-STAT 7, (LYTES, BLD GAS, ICA,H+H)
Acid-base deficit: 4 mmol/L — ABNORMAL HIGH (ref 0.0–2.0)
Acid-base deficit: 2 mmol/L (ref 0.0–2.0)
Bicarbonate: 19 mmol/L — ABNORMAL LOW (ref 20.0–28.0)
Bicarbonate: 22.2 mmol/L (ref 20.0–28.0)
Calcium, Ion: 1.16 mmol/L (ref 1.15–1.40)
Calcium, Ion: 1.17 mmol/L (ref 1.15–1.40)
HCT: 25 % — ABNORMAL LOW (ref 39.0–52.0)
HCT: 28 % — ABNORMAL LOW (ref 39.0–52.0)
Hemoglobin: 8.5 g/dL — ABNORMAL LOW (ref 13.0–17.0)
Hemoglobin: 9.5 g/dL — ABNORMAL LOW (ref 13.0–17.0)
O2 Saturation: 99 %
O2 Saturation: 96 %
Patient temperature: 98.6
Patient temperature: 99.4
Potassium: 3.2 mmol/L — ABNORMAL LOW (ref 3.5–5.1)
Potassium: 3.6 mmol/L (ref 3.5–5.1)
Sodium: 142 mmol/L (ref 135–145)
Sodium: 143 mmol/L (ref 135–145)
TCO2: 20 mmol/L — ABNORMAL LOW (ref 22–32)
TCO2: 23 mmol/L (ref 22–32)
pCO2 arterial: 27.4 mm[Hg] — ABNORMAL LOW (ref 32–48)
pCO2 arterial: 36.3 mm[Hg] (ref 32–48)
pH, Arterial: 7.448 (ref 7.35–7.45)
pH, Arterial: 7.396 (ref 7.35–7.45)
pO2, Arterial: 135 mm[Hg] — ABNORMAL HIGH (ref 83–108)
pO2, Arterial: 84 mm[Hg] (ref 83–108)

## 2023-06-18 LAB — GLUCOSE, CAPILLARY
Glucose-Capillary: 121 mg/dL — ABNORMAL HIGH (ref 70–99)
Glucose-Capillary: 156 mg/dL — ABNORMAL HIGH (ref 70–99)
Glucose-Capillary: 162 mg/dL — ABNORMAL HIGH (ref 70–99)
Glucose-Capillary: 180 mg/dL — ABNORMAL HIGH (ref 70–99)
Glucose-Capillary: 192 mg/dL — ABNORMAL HIGH (ref 70–99)
Glucose-Capillary: 216 mg/dL — ABNORMAL HIGH (ref 70–99)
Glucose-Capillary: 221 mg/dL — ABNORMAL HIGH (ref 70–99)

## 2023-06-18 LAB — CBC
HCT: 32.9 % — ABNORMAL LOW (ref 39.0–52.0)
Hemoglobin: 10.7 g/dL — ABNORMAL LOW (ref 13.0–17.0)
MCH: 22.7 pg — ABNORMAL LOW (ref 26.0–34.0)
MCHC: 32.5 g/dL (ref 30.0–36.0)
MCV: 69.9 fL — ABNORMAL LOW (ref 80.0–100.0)
Platelets: 117 10*3/uL — ABNORMAL LOW (ref 150–400)
RBC: 4.71 MIL/uL (ref 4.22–5.81)
RDW: 15 % (ref 11.5–15.5)
WBC: 9.5 10*3/uL (ref 4.0–10.5)
nRBC: 0 % (ref 0.0–0.2)

## 2023-06-18 LAB — BASIC METABOLIC PANEL
Anion gap: 11 (ref 5–15)
BUN: 34 mg/dL — ABNORMAL HIGH (ref 6–20)
CO2: 23 mmol/L (ref 22–32)
Calcium: 8.5 mg/dL — ABNORMAL LOW (ref 8.9–10.3)
Chloride: 106 mmol/L (ref 98–111)
Creatinine, Ser: 2.27 mg/dL — ABNORMAL HIGH (ref 0.61–1.24)
GFR, Estimated: 34 mL/min — ABNORMAL LOW (ref >60)
Glucose, Bld: 162 mg/dL — ABNORMAL HIGH (ref 70–99)
Potassium: 3.7 mmol/L (ref 3.5–5.1)
Sodium: 140 mmol/L (ref 135–145)

## 2023-06-18 LAB — MAGNESIUM: Magnesium: 2.1 mg/dL (ref 1.7–2.4)

## 2023-06-18 LAB — PHOSPHORUS: Phosphorus: 1.8 mg/dL — ABNORMAL LOW (ref 2.5–4.6)

## 2023-06-18 MED ORDER — FUROSEMIDE 10 MG/ML IJ SOLN
60.0000 mg | Freq: Once | INTRAMUSCULAR | Status: AC
  Administered 2023-06-18: 60 mg via INTRAVENOUS
  Filled 2023-06-18: qty 6

## 2023-06-18 MED ORDER — OSELTAMIVIR PHOSPHATE 6 MG/ML PO SUSR
30.0000 mg | Freq: Two times a day (BID) | ORAL | Status: DC
  Administered 2023-06-19 (×2): 30 mg
  Filled 2023-06-18 (×3): qty 12.5

## 2023-06-18 MED ORDER — SODIUM CHLORIDE 0.9 % IV SOLN
30.0000 mmol | Freq: Once | INTRAVENOUS | Status: AC
  Administered 2023-06-18: 30 mmol via INTRAVENOUS
  Filled 2023-06-18: qty 10

## 2023-06-18 NOTE — Progress Notes (Signed)
NAME:  Gary Conway, MRN:  [id number], DOB:  Jan 25, 1976, LOS: 1 ADMISSION DATE:  06/17/2023, CONSULTATION DATE:  06/17/23 REFERRING MD:  Freida Busman - EM, CHIEF COMPLAINT:  resp failure    History of Present Illness:  Hx from chart review due to degree of resp failure 48 yo M PMH recent DAH (10-03/2023) benign spinal cord tumor, DM, HTN presented to ED 1/20 via EMS for SOB. Onset unclear. With EMS he was hypoxic and SOB and placed on CPAP. Vomited in ED, remained hypoxic and placed on BiPAP  PCCM consulted in this setting  Only some Prelim labs are starting to result at time of consultation.    Pertinent  Medical History  HTN DAH DM  ARDS  Significant Hospital Events: Including procedures, antibiotic start and stop dates in addition to other pertinent events   1/20 to ED via EMS   Interim History / Subjective:  Patient continued to require vasopressor support, currently on 5 mics of Levophed FiO2 was titrated down to 40% Made 2.2 L of urine with 80 mg of Lasix x 1 Remain afebrile  Objective   Blood pressure 91/71, pulse 82, temperature 98.4 F (36.9 C), temperature source Axillary, resp. rate (!) 30, height 5\' 8"  (1.727 m), weight 80.3 kg, SpO2 100%.    Vent Mode: PRVC FiO2 (%):  [40 %-100 %] 40 % Set Rate:  [30 bmp] 30 bmp Vt Set:  [540 mL] 540 mL PEEP:  [10 cmH20-12 cmH20] 10 cmH20 Plateau Pressure:  [24 cmH20-30 cmH20] 26 cmH20   Intake/Output Summary (Last 24 hours) at 06/18/2023 9147 Last data filed at 06/18/2023 0500 Gross per 24 hour  Intake 3953.77 ml  Output 2225 ml  Net 1728.77 ml   Filed Weights   06/17/23 0903 06/18/23 0500  Weight: 80 kg 80.3 kg    Examination: General: Crtitically ill-appearing middle-aged male, orally intubated HEENT: [place]/AT, eyes anicteric.  ETT and OGT in place Neuro: Sedated, not following commands.  Eyes are closed.  Pupils 3 mm bilateral reactive to light Chest: Coarse breath sounds, no wheezes or rhonchi Heart: Regular rate and  rhythm, no murmurs or gallops Abdomen: Soft, nondistended, bowel sounds present Skin: No rash  Labs and images reviewed  Resolved Hospital Problem list     Assessment & Plan:  Acute respiratory failure with hypoxia Influenza pneumonia with severe ARDS Severe sepsis with septic shock due to influenza pneumonia, POA Possible aspiration pneumonia Lactic acidosis Demand cardiac ischemia Diabetes type 2 with hyperglycemia Hypocalcemia CKD stage IIIb Anemia and thrombocytopenia of critical illness  Continue lung protective ventilation VAP prevention bundle in place FiO2 was titrated down to 40% and PEEP at 8 PAD protocol with propofol and fentanyl ARDS has improved Continue Tamiflu Still requiring Levophed, titrate with MAP goal 65 Continue IV antibiotics Trend lactate Blood sugars are better controlled now Continue Semglee and sliding scale insulin CBG goal 140-180 Continue aggressive electrolyte replacement Will try Lasix again at 60 mg x 1 Serum creatinine is at baseline around 2.2-2.4 Monitor H&H and platelet count  Best Practice (right click and "Reselect all SmartList Selections" daily)   Diet/type: tubefeeds DVT prophylaxis prophylactic heparin  Pressure ulcer(s): pressure ulcer assessment deferred  GI prophylaxis: PPI Lines: N/A Foley:  N/A Code Status:  full code Last date of multidisciplinary goals of care discussion [1/20]  Labs   CBC: Recent Labs  Lab 06/17/23 0911 06/17/23 0915 06/17/23 1202 06/18/23 0504 06/18/23 0649  WBC 13.0*  --   --   --  9.5  NEUTROABS 12.1*  --   --   --   --   HGB 11.6*  12.6* 11.9* 10.2* 8.5* 10.7*  HCT 37.4*  37.0* 35.0* 30.0* 25.0* 32.9*  MCV 73.2*  --   --   --  69.9*  PLT 133*  --   --   --  117*    Basic Metabolic Panel: Recent Labs  Lab 06/17/23 0911 06/17/23 0915 06/17/23 1202 06/17/23 1853 06/18/23 0504 06/18/23 0649  NA 137  137 138 141  --  142 140  K 4.3  4.4 4.2 3.9  --  3.2* 3.7  CL 102   104  --   --   --   --  106  CO2 15*  --   --   --   --  23  GLUCOSE 363*  361*  --   --   --   --  162*  BUN 31*  29*  --   --   --   --  34*  CREATININE 2.51*  2.40*  --   --   --   --  2.27*  CALCIUM 8.5*  --   --   --   --  8.5*  MG  --   --   --  1.9  --  2.1  PHOS  --   --   --  3.2  --  1.8*   GFR: Estimated Creatinine Clearance: 38.1 mL/min (A) (by C-G formula based on SCr of 2.27 mg/dL (H)). Recent Labs  Lab 06/17/23 0911 06/17/23 1249 06/17/23 1344 06/17/23 1527 06/18/23 0649  PROCALCITON  --  21.61  --   --   --   WBC 13.0*  --   --   --  9.5  LATICACIDVEN 9.8*  --  3.4* 4.3*  --     Liver Function Tests: Recent Labs  Lab 06/17/23 0911  AST 242*  ALT 81*  ALKPHOS 75  BILITOT 1.8*  PROT 7.0  ALBUMIN 3.3*   No results for input(s): "LIPASE", "AMYLASE" in the last 168 hours. No results for input(s): "AMMONIA" in the last 168 hours.  ABG    Component Value Date/Time   PHART 7.448 06/18/2023 0504   PCO2ART 27.4 (L) 06/18/2023 0504   PO2ART 135 (H) 06/18/2023 0504   HCO3 19.0 (L) 06/18/2023 0504   TCO2 20 (L) 06/18/2023 0504   ACIDBASEDEF 4.0 (H) 06/18/2023 0504   O2SAT 99 06/18/2023 0504     Coagulation Profile: Recent Labs  Lab 06/17/23 0911  INR 1.3*    Cardiac Enzymes: No results for input(s): "CKTOTAL", "CKMB", "CKMBINDEX", "TROPONINI" in the last 168 hours.  HbA1C: Hgb A1c MFr Bld  Date/Time Value Ref Range Status  03/22/2023 06:06 PM 8.1 (H) 4.8 - 5.6 % Final    Comment:    (NOTE) Pre diabetes:          5.7%-6.4%  Diabetes:              >6.4%  Glycemic control for   <7.0% adults with diabetes   09/13/2019 02:26 AM 12.9 (H) 4.8 - 5.6 % Final    Comment:    (NOTE) Pre diabetes:          5.7%-6.4% Diabetes:              >6.4% Glycemic control for   <7.0% adults with diabetes     CBG: Recent Labs  Lab 06/17/23 1539 06/17/23 1940 06/17/23 2333 06/18/23 0332 06/18/23 0751  GLUCAP 236* 259* 198* 156* 121*    The  patient is critically ill due to acute respiratory failure with hypoxia.  Critical care was necessary to treat or prevent imminent or life-threatening deterioration.  Critical care was time spent personally by me on the following activities: development of treatment plan with patient and/or surrogate as well as nursing, discussions with consultants, evaluation of patient's response to treatment, examination of patient, obtaining history from patient or surrogate, ordering and performing treatments and interventions, ordering and review of laboratory studies, ordering and review of radiographic studies, pulse oximetry, re-evaluation of patient's condition and participation in multidisciplinary rounds.   During this encounter critical care time was devoted to patient care services described in this note for 38 minutes.     Cheri [facility], MD [facility] See Amion for pager If no response to pager, please call [phone] until 7pm After 7pm, Please call E-link [phone]

## 2023-06-18 NOTE — Plan of Care (Signed)
Pt remains calm with a Rass score of -2, pt is able to follow directions and as the night progressed was able to have his needs met. Bp remains stable on levophed at .

## 2023-06-18 NOTE — Progress Notes (Addendum)
Brief Nutrition Support Note  Reviewed AM labs after starting enteral nutrition 1/20 and noted a decrease in phosphorus. RPH already ordered replacement. Pt on pressor x 1 this AM with stable infusion rate.    Reference Range 06/17/23 18:53 06/18/23 05:04 06/18/23 06:49  Potassium 3.5 - 5.1 mmol/L  3.2 (L) 3.7  Phosphorus 2.5 - 4.6 mg/dL 3.2  1.8 (L)  Magnesium 1.7 - 2.4 mg/dL 1.9  2.1  (L): Data is abnormally low  Will decrease TF rate back down to 25 and then slowly advance back to goal to allow time for electrolyte replacement and hemodynamic stability.   Greig Castilla, RD, LDN Registered Dietitian II Please reach out via secure chat Weekend on-call pager # available in [facility]

## 2023-06-19 ENCOUNTER — Ambulatory Visit: Payer: Self-pay | Admitting: Family

## 2023-06-19 ENCOUNTER — Inpatient Hospital Stay (HOSPITAL_COMMUNITY): Payer: Self-pay

## 2023-06-19 LAB — CULTURE, RESPIRATORY W GRAM STAIN

## 2023-06-19 LAB — CBC
HCT: 29.6 % — ABNORMAL LOW (ref 39.0–52.0)
Hemoglobin: 9.7 g/dL — ABNORMAL LOW (ref 13.0–17.0)
MCH: 22.8 pg — ABNORMAL LOW (ref 26.0–34.0)
MCHC: 32.8 g/dL (ref 30.0–36.0)
MCV: 69.6 fL — ABNORMAL LOW (ref 80.0–100.0)
Platelets: 97 10*3/uL — ABNORMAL LOW (ref 150–400)
RBC: 4.25 MIL/uL (ref 4.22–5.81)
RDW: 15.2 % (ref 11.5–15.5)
WBC: 6 10*3/uL (ref 4.0–10.5)
nRBC: 0 % (ref 0.0–0.2)

## 2023-06-19 LAB — BASIC METABOLIC PANEL
Anion gap: 9 (ref 5–15)
BUN: 27 mg/dL — ABNORMAL HIGH (ref 6–20)
CO2: 22 mmol/L (ref 22–32)
Calcium: 7.8 mg/dL — ABNORMAL LOW (ref 8.9–10.3)
Chloride: 112 mmol/L — ABNORMAL HIGH (ref 98–111)
Creatinine, Ser: 1.66 mg/dL — ABNORMAL HIGH (ref 0.61–1.24)
GFR, Estimated: 50 mL/min — ABNORMAL LOW (ref >60)
Glucose, Bld: 180 mg/dL — ABNORMAL HIGH (ref 70–99)
Potassium: 3.5 mmol/L (ref 3.5–5.1)
Sodium: 143 mmol/L (ref 135–145)

## 2023-06-19 LAB — MAGNESIUM: Magnesium: 2.1 mg/dL (ref 1.7–2.4)

## 2023-06-19 LAB — GLUCOSE, CAPILLARY
Glucose-Capillary: 165 mg/dL — ABNORMAL HIGH (ref 70–99)
Glucose-Capillary: 204 mg/dL — ABNORMAL HIGH (ref 70–99)
Glucose-Capillary: 193 mg/dL — ABNORMAL HIGH (ref 70–99)
Glucose-Capillary: 279 mg/dL — ABNORMAL HIGH (ref 70–99)
Glucose-Capillary: 212 mg/dL — ABNORMAL HIGH (ref 70–99)
Glucose-Capillary: 229 mg/dL — ABNORMAL HIGH (ref 70–99)

## 2023-06-19 LAB — PHOSPHORUS: Phosphorus: 3.1 mg/dL (ref 2.5–4.6)

## 2023-06-19 MED ORDER — INSULIN GLARGINE-YFGN 100 UNIT/ML ~~LOC~~ SOLN
15.0000 [IU] | Freq: Two times a day (BID) | SUBCUTANEOUS | Status: DC
  Administered 2023-06-19 – 2023-06-24 (×11): 15 [IU] via SUBCUTANEOUS
  Filled 2023-06-19 (×12): qty 0.15

## 2023-06-19 MED ORDER — FUROSEMIDE 10 MG/ML IJ SOLN
60.0000 mg | Freq: Once | INTRAMUSCULAR | Status: AC
  Administered 2023-06-19: 60 mg via INTRAVENOUS
  Filled 2023-06-19: qty 6

## 2023-06-19 MED ORDER — ACETAMINOPHEN 160 MG/5ML PO SOLN
650.0000 mg | ORAL | Status: DC | PRN
  Administered 2023-06-19: 650 mg
  Filled 2023-06-19: qty 20.3

## 2023-06-19 MED ORDER — POTASSIUM CHLORIDE 20 MEQ PO PACK
40.0000 meq | PACK | ORAL | Status: AC
  Administered 2023-06-19 (×2): 40 meq
  Filled 2023-06-19 (×2): qty 2

## 2023-06-19 NOTE — Progress Notes (Signed)
NAME:  Gary Conway, MRN:  [id number], DOB:  22-Nov-1975, LOS: 2 ADMISSION DATE:  06/17/2023, CONSULTATION DATE:  06/17/23 REFERRING MD:  Freida Busman - EM, CHIEF COMPLAINT:  resp failure    History of Present Illness:  Hx from chart review due to degree of resp failure 48 yo M PMH recent DAH (10-03/2023) benign spinal cord tumor, DM, HTN presented to ED 1/20 via EMS for SOB. Onset unclear. With EMS he was hypoxic and SOB and placed on CPAP. Vomited in ED, remained hypoxic and placed on BiPAP  PCCM consulted in this setting  Only some Prelim labs are starting to result at time of consultation.    Pertinent  Medical History  HTN DAH DM  ARDS  Significant Hospital Events: Including procedures, antibiotic start and stop dates in addition to other pertinent events   1/20 to ED via EMS   Interim History / Subjective:  Patient continues to require low-dose vasopressor support, currently on 3 mics of Levophed Tolerating spontaneous breathing trial but thick Neck He was given 60 mg of Lasix yesterday with 2.7 L urine output and with improvement in serum creatinine  Spiked fever with Tmax 100.4  Objective   Blood pressure 116/79, pulse 84, temperature (!) 100.4 F (38 C), temperature source Axillary, resp. rate (!) 28, height 5' 7.99" (1.727 m), weight 81.8 kg, SpO2 99%.    Vent Mode: PSV;CPAP FiO2 (%):  [40 %] 40 % Set Rate:  [22 bmp-30 bmp] 22 bmp Vt Set:  [540 mL] 540 mL PEEP:  [5 cmH20] 5 cmH20 Pressure Support:  [5 cmH20] 5 cmH20 Plateau Pressure:  [20 cmH20-23 cmH20] 23 cmH20   Intake/Output Summary (Last 24 hours) at 06/19/2023 4098 Last data filed at 06/19/2023 0800 Gross per 24 hour  Intake 3007.68 ml  Output 2750 ml  Net 257.68 ml   Filed Weights   06/17/23 0903 06/18/23 0500 06/19/23 0300  Weight: 80 kg 80.3 kg 81.8 kg    Examination: General: Crtitically ill-appearing middle-aged male, orally intubated HEENT: [place]/AT, eyes anicteric.  ETT and OGT in place Neuro: Eyes  closed, opens with vocal stimuli, following simple commands Chest: Bilateral faint crackles left more than right, no wheezes or rhonchi Heart: Regular rate and rhythm, no murmurs or gallops Abdomen: Soft, nondistended, bowel sounds present Skin: No rash  Labs and images reviewed  Resolved Hospital Problem list     Assessment & Plan:  Acute respiratory failure with hypoxia Influenza pneumonia with severe ARDS Severe sepsis with septic shock due to influenza pneumonia, POA Possible aspiration pneumonia Lactic acidosis Demand cardiac ischemia Diabetes type 2 with hyperglycemia Hypocalcemia/hypocalcemia/hypophosphatemia AKI CKD stage IIIa Anemia and thrombocytopenia of critical illness  Continue lung protective ventilation VAP prevention bundle in place Patient is tolerating spontaneous breathing trial but tachypneic, will watch carefully, he may be extubated to BiPAP Will try Lasix again as serum creatinine continue to improve and he is making between 2 to 3 L of urine every day ARDS has resolved Continue Tamiflu Continue IV antibiotics Lactate accidentally down Blood sugars are slightly elevated again Will increase Semglee to 15 units twice daily Continue sliding scale insulin Continue aggressive electrolyte replacement Monitor H&H and platelet count  Best Practice (right click and "Reselect all SmartList Selections" daily)   Diet/type: tubefeeds DVT prophylaxis prophylactic heparin  Pressure ulcer(s): pressure ulcer assessment deferred  GI prophylaxis: PPI Lines: N/A Foley:  N/A Code Status:  full code Last date of multidisciplinary goals of care discussion [1/20]  Labs   CBC:  Recent Labs  Lab 06/17/23 0911 06/17/23 0915 06/17/23 1202 06/18/23 0504 06/18/23 0649 06/18/23 1228 06/19/23 0432  WBC 13.0*  --   --   --  9.5  --  6.0  NEUTROABS 12.1*  --   --   --   --   --   --   HGB 11.6*  12.6*   < > 10.2* 8.5* 10.7* 9.5* 9.7*  HCT 37.4*  37.0*   < > 30.0*  25.0* 32.9* 28.0* 29.6*  MCV 73.2*  --   --   --  69.9*  --  69.6*  PLT 133*  --   --   --  117*  --  97*   < > = values in this interval not displayed.    Basic Metabolic Panel: Recent Labs  Lab 06/17/23 0911 06/17/23 0915 06/17/23 1202 06/17/23 1853 06/18/23 0504 06/18/23 0649 06/18/23 1228 06/19/23 0432  NA 137  137   < > 141  --  142 140 143 143  K 4.3  4.4   < > 3.9  --  3.2* 3.7 3.6 3.5  CL 102  104  --   --   --   --  106  --  112*  CO2 15*  --   --   --   --  23  --  22  GLUCOSE 363*  361*  --   --   --   --  162*  --  180*  BUN 31*  29*  --   --   --   --  34*  --  27*  CREATININE 2.51*  2.40*  --   --   --   --  2.27*  --  1.66*  CALCIUM 8.5*  --   --   --   --  8.5*  --  7.8*  MG  --   --   --  1.9  --  2.1  --  2.1  PHOS  --   --   --  3.2  --  1.8*  --  3.1   < > = values in this interval not displayed.   GFR: Estimated Creatinine Clearance: 52.1 mL/min (A) (by C-G formula based on SCr of 1.66 mg/dL (H)). Recent Labs  Lab 06/17/23 0911 06/17/23 1249 06/17/23 1344 06/17/23 1527 06/18/23 0649 06/19/23 0432  PROCALCITON  --  21.61  --   --   --   --   WBC 13.0*  --   --   --  9.5 6.0  LATICACIDVEN 9.8*  --  3.4* 4.3*  --   --     Liver Function Tests: Recent Labs  Lab 06/17/23 0911  AST 242*  ALT 81*  ALKPHOS 75  BILITOT 1.8*  PROT 7.0  ALBUMIN 3.3*   No results for input(s): "LIPASE", "AMYLASE" in the last 168 hours. No results for input(s): "AMMONIA" in the last 168 hours.  ABG    Component Value Date/Time   PHART 7.396 06/18/2023 1228   PCO2ART 36.3 06/18/2023 1228   PO2ART 84 06/18/2023 1228   HCO3 22.2 06/18/2023 1228   TCO2 23 06/18/2023 1228   ACIDBASEDEF 2.0 06/18/2023 1228   O2SAT 96 06/18/2023 1228     Coagulation Profile: Recent Labs  Lab 06/17/23 0911  INR 1.3*    Cardiac Enzymes: No results for input(s): "CKTOTAL", "CKMB", "CKMBINDEX", "TROPONINI" in the last 168 hours.  HbA1C: Hgb A1c MFr Bld  Date/Time  Value Ref Range Status  03/22/2023 06:06 PM 8.1 (H) 4.8 - 5.6 % Final    Comment:    (NOTE) Pre diabetes:          5.7%-6.4%  Diabetes:              >6.4%  Glycemic control for   <7.0% adults with diabetes   09/13/2019 02:26 AM 12.9 (H) 4.8 - 5.6 % Final    Comment:    (NOTE) Pre diabetes:          5.7%-6.4% Diabetes:              >6.4% Glycemic control for   <7.0% adults with diabetes     CBG: Recent Labs  Lab 06/18/23 1933 06/18/23 2208 06/18/23 2347 06/19/23 0400 06/19/23 0750  GLUCAP 216* 180* 192* 165* 204*    The patient is critically ill due to acute respiratory failure with hypoxia.  Critical care was necessary to treat or prevent imminent or life-threatening deterioration.  Critical care was time spent personally by me on the following activities: development of treatment plan with patient and/or surrogate as well as nursing, discussions with consultants, evaluation of patient's response to treatment, examination of patient, obtaining history from patient or surrogate, ordering and performing treatments and interventions, ordering and review of laboratory studies, ordering and review of radiographic studies, pulse oximetry, re-evaluation of patient's condition and participation in multidisciplinary rounds.   During this encounter critical care time was devoted to patient care services described in this note for 35 minutes.     Cheri Fowler, MD  Pulmonary Critical Care See Amion for pager If no response to pager, please call [phone] until 7pm After 7pm, Please call E-link [phone]

## 2023-06-19 NOTE — Plan of Care (Signed)
  Problem: Clinical Measurements: Goal: Will remain free from infection Outcome: Progressing Goal: Diagnostic test results will improve Outcome: Progressing Goal: Respiratory complications will improve Outcome: Progressing Goal: Cardiovascular complication will be avoided Outcome: Progressing   

## 2023-06-20 LAB — GLUCOSE, CAPILLARY
Glucose-Capillary: 153 mg/dL — ABNORMAL HIGH (ref 70–99)
Glucose-Capillary: 163 mg/dL — ABNORMAL HIGH (ref 70–99)
Glucose-Capillary: 195 mg/dL — ABNORMAL HIGH (ref 70–99)
Glucose-Capillary: 227 mg/dL — ABNORMAL HIGH (ref 70–99)
Glucose-Capillary: 249 mg/dL — ABNORMAL HIGH (ref 70–99)
Glucose-Capillary: 250 mg/dL — ABNORMAL HIGH (ref 70–99)

## 2023-06-20 LAB — MAGNESIUM: Magnesium: 2.5 mg/dL — ABNORMAL HIGH (ref 1.7–2.4)

## 2023-06-20 LAB — BASIC METABOLIC PANEL
Anion gap: 9 (ref 5–15)
BUN: 24 mg/dL — ABNORMAL HIGH (ref 6–20)
CO2: 22 mmol/L (ref 22–32)
Calcium: 7.9 mg/dL — ABNORMAL LOW (ref 8.9–10.3)
Chloride: 112 mmol/L — ABNORMAL HIGH (ref 98–111)
Creatinine, Ser: 1.44 mg/dL — ABNORMAL HIGH (ref 0.61–1.24)
GFR, Estimated: 60 mL/min — ABNORMAL LOW (ref >60)
Glucose, Bld: 349 mg/dL — ABNORMAL HIGH (ref 70–99)
Potassium: 3.9 mmol/L (ref 3.5–5.1)
Sodium: 143 mmol/L (ref 135–145)

## 2023-06-20 MED ORDER — POTASSIUM CHLORIDE 20 MEQ PO PACK
40.0000 meq | PACK | Freq: Once | ORAL | Status: AC
  Administered 2023-06-20: 40 meq
  Filled 2023-06-20: qty 2

## 2023-06-20 MED ORDER — ADULT MULTIVITAMIN W/MINERALS CH
1.0000 | ORAL_TABLET | Freq: Every day | ORAL | Status: DC
  Administered 2023-06-21 – 2023-06-24 (×4): 1 via ORAL
  Filled 2023-06-20 (×4): qty 1

## 2023-06-20 MED ORDER — OSELTAMIVIR PHOSPHATE 6 MG/ML PO SUSR
75.0000 mg | Freq: Two times a day (BID) | ORAL | Status: DC
  Administered 2023-06-20: 75 mg
  Filled 2023-06-20 (×2): qty 12.5

## 2023-06-20 MED ORDER — OSELTAMIVIR PHOSPHATE 75 MG PO CAPS
75.0000 mg | ORAL_CAPSULE | Freq: Two times a day (BID) | ORAL | Status: DC
  Filled 2023-06-20: qty 1

## 2023-06-20 MED ORDER — INSULIN ASPART 100 UNIT/ML IJ SOLN: 4.0000 [IU] | INTRAMUSCULAR | Status: DC

## 2023-06-20 MED ORDER — ORAL CARE MOUTH RINSE
15.0000 mL | OROMUCOSAL | Status: DC | PRN
Start: 2023-06-20 — End: 2023-06-23

## 2023-06-20 MED ORDER — POLYETHYLENE GLYCOL 3350 17 G PO PACK
17.0000 g | PACK | Freq: Every day | ORAL | Status: DC
  Administered 2023-06-23: 17 g via ORAL
  Filled 2023-06-20: qty 1

## 2023-06-20 MED ORDER — FUROSEMIDE 10 MG/ML IJ SOLN
60.0000 mg | Freq: Every day | INTRAMUSCULAR | Status: DC
  Administered 2023-06-20: 60 mg via INTRAVENOUS
  Filled 2023-06-20 (×2): qty 6

## 2023-06-20 MED ORDER — THIAMINE MONONITRATE 100 MG PO TABS
100.0000 mg | ORAL_TABLET | Freq: Every day | ORAL | Status: DC
  Administered 2023-06-21 – 2023-06-24 (×4): 100 mg via ORAL
  Filled 2023-06-20 (×4): qty 1

## 2023-06-20 MED ORDER — OSELTAMIVIR PHOSPHATE 75 MG PO CAPS
75.0000 mg | ORAL_CAPSULE | Freq: Two times a day (BID) | ORAL | Status: AC
  Administered 2023-06-20 – 2023-06-21 (×3): 75 mg via ORAL
  Filled 2023-06-20 (×4): qty 1

## 2023-06-20 MED ORDER — DOCUSATE SODIUM 100 MG PO CAPS
100.0000 mg | ORAL_CAPSULE | Freq: Two times a day (BID) | ORAL | Status: DC
  Administered 2023-06-21 – 2023-06-23 (×2): 100 mg via ORAL
  Filled 2023-06-20 (×3): qty 1

## 2023-06-20 MED ORDER — FOLIC ACID 1 MG PO TABS
1.0000 mg | ORAL_TABLET | Freq: Every day | ORAL | Status: DC
  Administered 2023-06-21 – 2023-06-24 (×4): 1 mg via ORAL
  Filled 2023-06-20 (×4): qty 1

## 2023-06-20 MED ORDER — CARVEDILOL 12.5 MG PO TABS
12.5000 mg | ORAL_TABLET | Freq: Two times a day (BID) | ORAL | Status: DC
  Administered 2023-06-20 – 2023-06-21 (×2): 12.5 mg via ORAL
  Filled 2023-06-20 (×2): qty 1

## 2023-06-20 NOTE — Procedures (Signed)
Extubation Procedure Note  Patient Details:   Name: Gary Conway DOB: 06/08/1975 MRN: [id number]   Airway Documentation:    Vent end date: 06/20/23 Vent end time: 0953   Evaluation  O2 sats: stable throughout Complications: No apparent complications Patient did tolerate procedure well. Bilateral Breath Sounds: Clear, Diminished   Yes Pt extubated to 2L [place]. Positive cuff leak noted.  Allen Norris 06/20/2023, 9:54 AM

## 2023-06-20 NOTE — Evaluation (Signed)
Physical Therapy Evaluation Patient Details Name: Gary Conway MRN: [id number] DOB: November 02, 1975 Today's Date: 06/20/2023  History of Present Illness  Pt is a 48 y/o male presenting to the ED on 06/17/23 with shortness of breath. Placed on BiPAP and vomited, then intubated. Severe sepsis due to pna; vasopressor support; extubated 1/23  PMHx:  DMII, hemangioma of spine resected with laminectomy T2-4, HTN,  Clinical Impression   Pt admitted secondary to problem above with deficits below. PTA patient was living with a roommate and independent with all mobility and ADLs. Pt currently requires CGA for basic mobility OOB to chair. Ambulation deferred due to HR 138 bpm with 2 steps to chair. Anticipate patient will benefit from PT to address problems listed below.Will continue to follow acutely to maximize functional mobility independence and safety. Anticpate good progress with no need for DME or follow-up PT on discharge.          If plan is discharge home, recommend the following: Help with stairs or ramp for entrance   Can travel by private vehicle        Equipment Recommendations None recommended by PT  Recommendations for Other Services       Functional Status Assessment Patient has had a recent decline in their functional status and demonstrates the ability to make significant improvements in function in a reasonable and predictable amount of time.     Precautions / Restrictions Precautions Precautions: Other (comment) Precaution Comments: ICU multiple lines, tubes      Mobility  Bed Mobility Overal bed mobility: Needs Assistance Bed Mobility: Supine to Sit     Supine to sit: Contact guard, HOB elevated     General bed mobility comments: slow, labored but no physical assist    Transfers Overall transfer level: Needs assistance Equipment used: None Transfers: Sit to/from Stand, Bed to chair/wheelchair/BSC Sit to Stand: Contact guard assist   Step pivot transfers:  Contact guard assist       General transfer comment: no imbalance noted    Ambulation/Gait               General Gait Details: deferred due to HR up to 138 bpm with steps to chair  Stairs            Wheelchair Mobility     Tilt Bed    Modified Rankin (Stroke Patients Only)       Balance Overall balance assessment: No apparent balance deficits (not formally assessed)                                           Pertinent Vitals/Pain Pain Assessment Pain Assessment: No/denies pain    Home Living Family/patient expects to be discharged to:: Private residence Living Arrangements: Non-relatives/Friends Available Help at Discharge: Friend(s);Available PRN/intermittently Type of Home: Apartment Home Access: Stairs to enter   Entrance Stairs-Number of Steps: several   Home Layout: One level Home Equipment: Agricultural consultant (2 wheels)      Prior Function Prior Level of Function : Independent/Modified Independent                     Extremity/Trunk Assessment   Upper Extremity Assessment Upper Extremity Assessment: Overall WFL for tasks assessed    Lower Extremity Assessment Lower Extremity Assessment: Overall WFL for tasks assessed    Cervical / Trunk Assessment Cervical / Trunk Assessment: Normal  Communication  Communication Communication: No apparent difficulties (very few words spoken)  Cognition Arousal: Alert Behavior During Therapy: Flat affect Overall Cognitive Status: Within Functional Limits for tasks assessed                                          General Comments      Exercises     Assessment/Plan    PT Assessment Patient needs continued PT services  PT Problem List Decreased activity tolerance;Decreased mobility;Cardiopulmonary status limiting activity       PT Treatment Interventions DME instruction;Gait training;Stair training;Functional mobility training;Therapeutic  activities;Therapeutic exercise;Patient/family education    PT Goals (Current goals can be found in the Care Plan section)  Acute Rehab PT Goals Patient Stated Goal: return to apartment PT Goal Formulation: With patient Time For Goal Achievement: 07/04/23 Potential to Achieve Goals: Good    Frequency Min 1X/week     Co-evaluation               AM-PAC PT "6 Clicks" Mobility  Outcome Measure Help needed turning from your back to your side while in a flat bed without using bedrails?: None Help needed moving from lying on your back to sitting on the side of a flat bed without using bedrails?: A Little Help needed moving to and from a bed to a chair (including a wheelchair)?: A Little Help needed standing up from a chair using your arms (e.g., wheelchair or bedside chair)?: A Little Help needed to walk in hospital room?: Total Help needed climbing 3-5 steps with a railing? : Total 6 Click Score: 15    End of Session Equipment Utilized During Treatment: Oxygen Activity Tolerance: Treatment limited secondary to medical complications (Comment) (HR 138) Patient left: in chair;with call bell/phone within reach;with chair alarm set;with SCD's reapplied Nurse Communication: Mobility status PT Visit Diagnosis: Difficulty in walking, not elsewhere classified (R26.2)    Time: 1520-1550 PT Time Calculation (min) (ACUTE ONLY): 30 min   Charges:   PT Evaluation $PT Eval Moderate Complexity: 1 Mod   PT General Charges $$ ACUTE PT VISIT: 1 Visit          Jerolyn Center, PT Acute Rehabilitation Services  Office [phone]   Zena Amos 06/20/2023, 4:05 PM

## 2023-06-20 NOTE — Progress Notes (Signed)
NAME:  Gary Conway, MRN:  [id number], DOB:  10-05-1975, LOS: 3 ADMISSION DATE:  06/17/2023, CONSULTATION DATE:  06/17/23 REFERRING MD:  Freida Busman - EM, CHIEF COMPLAINT:  resp failure    History of Present Illness:  Hx from chart review due to degree of resp failure 48 yo M PMH recent DAH (10-03/2023) benign spinal cord tumor, DM, HTN presented to ED 1/20 via EMS for SOB. Onset unclear. With EMS he was hypoxic and SOB and placed on CPAP. Vomited in ED, remained hypoxic and placed on BiPAP  PCCM consulted in this setting  Only some Prelim labs are starting to result at time of consultation.    Pertinent  Medical History  HTN DAH DM  ARDS  Significant Hospital Events: Including procedures, antibiotic start and stop dates in addition to other pertinent events   1/20 to ED via EMS   Interim History / Subjective:  Patient was titrated off of Levophed He is tolerating supportive breathing trial, much more calm and relaxed Remain afebrile Serum creatinine continue to improve  Objective   Blood pressure 99/66, pulse 84, temperature 99 F (37.2 C), temperature source Axillary, resp. rate (!) 28, height 5' 7.99" (1.727 m), weight 81.2 kg, SpO2 97%.    Vent Mode: PSV;CPAP FiO2 (%):  [40 %] 40 % Set Rate:  [22 bmp] 22 bmp Vt Set:  [540 mL] 540 mL PEEP:  [5 cmH20] 5 cmH20 Pressure Support:  [8 cmH20] 8 cmH20 Plateau Pressure:  [20 cmH20-22 cmH20] 21 cmH20   Intake/Output Summary (Last 24 hours) at 06/20/2023 0857 Last data filed at 06/20/2023 0800 Gross per 24 hour  Intake 1844.72 ml  Output 2500 ml  Net -655.28 ml   Filed Weights   06/18/23 0500 06/19/23 0300 06/20/23 0800  Weight: 80.3 kg 81.8 kg 81.2 kg    Examination: General: Chronically ill-appearing male, lying on the bed HEENT: [place]/AT, eyes anicteric.  moist mucus membranes Neuro: Eyes closed, opens with vocal stimuli, following simple commands Chest: Coarse breath sounds, no wheezes or rhonchi Heart: Regular rate and  rhythm, no murmurs or gallops Abdomen: Soft, nontender, nondistended, bowel sounds present Skin: No rash  Labs and images reviewed  Resolved Hospital Problem list     Assessment & Plan:  Acute respiratory failure with hypoxia Influenza pneumonia with severe ARDS Severe sepsis with septic shock due to influenza pneumonia, POA Possible aspiration pneumonia Lactic acidosis Demand cardiac ischemia Diabetes type 2 with hyperglycemia Hypocalcemia/hypocalcemia/hypophosphatemia AKI CKD stage IIIa Anemia and thrombocytopenia of critical illness  Continue lung protective ventilation VAP prevention bundle in place Patient is tolerating spontaneous breathing trial, he is not tachypneic anymore, will try to extubate him today ARDS has resolved Continue Tamiflu Continue IV antibiotics Blood sugars are elevated will add tube feed coverage Continue Semglee 15 units twice daily and sliding scale insulin with CBG goal 140-180 Serum creatinine continue to improve, will try Lasix again 60 mg daily, reevaluate every day Avoid nephrotoxic agent Continue aggressive electrolyte replacement Platelet count continued to drop, currently at 97, watch for signs of bleeding Iron studies are consistent with anemia of chronic disease  Best Practice (right click and "Reselect all SmartList Selections" daily)   Diet/type: tubefeeds DVT prophylaxis prophylactic heparin  Pressure ulcer(s): pressure ulcer assessment deferred  GI prophylaxis: PPI Lines: N/A Foley:  N/A Code Status:  full code Last date of multidisciplinary goals of care discussion [1/20]  Labs   CBC: Recent Labs  Lab 06/17/23 0911 06/17/23 0915 06/17/23 1202 06/18/23 0504 06/18/23  2952 06/18/23 1228 06/19/23 0432  WBC 13.0*  --   --   --  9.5  --  6.0  NEUTROABS 12.1*  --   --   --   --   --   --   HGB 11.6*  12.6*   < > 10.2* 8.5* 10.7* 9.5* 9.7*  HCT 37.4*  37.0*   < > 30.0* 25.0* 32.9* 28.0* 29.6*  MCV 73.2*  --   --   --   69.9*  --  69.6*  PLT 133*  --   --   --  117*  --  97*   < > = values in this interval not displayed.    Basic Metabolic Panel: Recent Labs  Lab 06/17/23 0911 06/17/23 0915 06/17/23 1853 06/18/23 0504 06/18/23 0649 06/18/23 1228 06/19/23 0432 06/20/23 0456  NA 137  137   < >  --  142 140 143 143 143  K 4.3  4.4   < >  --  3.2* 3.7 3.6 3.5 3.9  CL 102  104  --   --   --  106  --  112* 112*  CO2 15*  --   --   --  23  --  22 22  GLUCOSE 363*  361*  --   --   --  162*  --  180* 349*  BUN 31*  29*  --   --   --  34*  --  27* 24*  CREATININE 2.51*  2.40*  --   --   --  2.27*  --  1.66* 1.44*  CALCIUM 8.5*  --   --   --  8.5*  --  7.8* 7.9*  MG  --   --  1.9  --  2.1  --  2.1 2.5*  PHOS  --   --  3.2  --  1.8*  --  3.1  --    < > = values in this interval not displayed.   GFR: Estimated Creatinine Clearance: 60 mL/min (A) (by C-G formula based on SCr of 1.44 mg/dL (H)). Recent Labs  Lab 06/17/23 0911 06/17/23 1249 06/17/23 1344 06/17/23 1527 06/18/23 0649 06/19/23 0432  PROCALCITON  --  21.61  --   --   --   --   WBC 13.0*  --   --   --  9.5 6.0  LATICACIDVEN 9.8*  --  3.4* 4.3*  --   --     Liver Function Tests: Recent Labs  Lab 06/17/23 0911  AST 242*  ALT 81*  ALKPHOS 75  BILITOT 1.8*  PROT 7.0  ALBUMIN 3.3*   No results for input(s): "LIPASE", "AMYLASE" in the last 168 hours. No results for input(s): "AMMONIA" in the last 168 hours.  ABG    Component Value Date/Time   PHART 7.396 06/18/2023 1228   PCO2ART 36.3 06/18/2023 1228   PO2ART 84 06/18/2023 1228   HCO3 22.2 06/18/2023 1228   TCO2 23 06/18/2023 1228   ACIDBASEDEF 2.0 06/18/2023 1228   O2SAT 96 06/18/2023 1228     Coagulation Profile: Recent Labs  Lab 06/17/23 0911  INR 1.3*    Cardiac Enzymes: No results for input(s): "CKTOTAL", "CKMB", "CKMBINDEX", "TROPONINI" in the last 168 hours.  HbA1C: Hgb A1c MFr Bld  Date/Time Value Ref Range Status  03/22/2023 06:06 PM 8.1 (H) 4.8  - 5.6 % Final    Comment:    (NOTE) Pre diabetes:  5.7%-6.4%  Diabetes:              >6.4%  Glycemic control for   <7.0% adults with diabetes   09/13/2019 02:26 AM 12.9 (H) 4.8 - 5.6 % Final    Comment:    (NOTE) Pre diabetes:          5.7%-6.4% Diabetes:              >6.4% Glycemic control for   <7.0% adults with diabetes     CBG: Recent Labs  Lab 06/19/23 1520 06/19/23 1911 06/19/23 2313 06/20/23 0351 06/20/23 0727  GLUCAP 279* 212* 229* 250* 227*    The patient is critically ill due to acute respiratory failure with hypoxia.  Critical care was necessary to treat or prevent imminent or life-threatening deterioration.  Critical care was time spent personally by me on the following activities: development of treatment plan with patient and/or surrogate as well as nursing, discussions with consultants, evaluation of patient's response to treatment, examination of patient, obtaining history from patient or surrogate, ordering and performing treatments and interventions, ordering and review of laboratory studies, ordering and review of radiographic studies, pulse oximetry, re-evaluation of patient's condition and participation in multidisciplinary rounds.   During this encounter critical care time was devoted to patient care services described in this note for 33 minutes.     Cheri Fowler, MD [facility] See Amion for pager If no response to pager, please call [phone] until 7pm After 7pm, Please call E-link [phone]

## 2023-06-20 NOTE — Progress Notes (Signed)
Nutrition Follow-up  DOCUMENTATION CODES:  Not applicable  INTERVENTION:  Advance diet as able Monitor PO intake for need for nutrition supplements  NUTRITION DIAGNOSIS:  Inadequate oral intake related to inability to eat as evidenced by NPO status. - remains applicable  GOAL:  Patient will meet greater than or equal to 90% of their needs - progressing  MONITOR:  Diet advancement, Vent status, Labs, I & O's, TF tolerance  REASON FOR ASSESSMENT:  Consult, Ventilator Enteral/tube feeding initiation and management  ASSESSMENT:  48 y.o. male presented to the ED with SOB. PMH includes HTN, DM, and DAH benign spinal cord tumor. Pt admitted with sepsis, Flu A PNA, severe ARDS, and AKI.   1/20 - Admitted; Intubated 1/23 - extubated  Pt resting in bed at the time of assessment on [place]. Recently extubated, not enough time has passed to have [place] swallow evaluation. RN to do sometime this afternoon. Pt not very talkative, mostly nods or shakes his head.  Pt well nourished, will monitor for diet advancement and add supplements pending intake trends. RN not anticipating difficulty swallowing.  Admit weight: 80 kg  Current weight: 81.2 kg   Intake/Output Summary (Last 24 hours) at 06/20/2023 1027 Last data filed at 06/20/2023 0800 Gross per 24 hour  Intake 1382.23 ml  Output 2125 ml  Net -742.77 ml  Net IO Since Admission: 1,331.17 mL [06/20/23 1027]  Drains/Lines: Art line UOP x 24 hours  Nutritionally Relevant Medications: Scheduled Meds:  docusate  100 mg Per Tube BID   PROSource TF20  60 mL Per Tube Daily   folic acid  1 mg Per Tube Daily   furosemide  60 mg Intravenous Daily   insulin aspart  0-15 Units Subcutaneous Q4H   insulin aspart  4 Units Subcutaneous Q4H   insulin glargine-yfgn  15 Units Subcutaneous BID   multivitamin with minerals  1 tablet Per Tube Daily   pantoprazole (PROTONIX) IV  40 mg Intravenous Daily   polyethylene glycol  17 g Per Tube Daily    thiamine  100 mg Per Tube Daily   Continuous Infusions:  azithromycin 250 mL/hr at 06/20/23 0800   cefTRIAXone (ROCEPHIN)  IV 2 g (06/20/23 0920)   feeding supplement (VITAL 1.5 CAL) Stopped (06/20/23 0910)   PRN Meds: docusate, polyethylene glycol  Labs Reviewed: Chloride 112 BUN 24, creatinine 1.44 Mg 2.5 CBG ranges from 165-250 mg/dL over the last 24 hours HgbA1c 8.1%  NUTRITION - FOCUSED PHYSICAL EXAM: Flowsheet Row Most Recent Value  Orbital Region No depletion  Upper Arm Region No depletion  Thoracic and Lumbar Region No depletion  Buccal Region No depletion  Temple Region No depletion  Clavicle Bone Region No depletion  Clavicle and Acromion Bone Region No depletion  Scapular Bone Region Unable to assess  Dorsal Hand No depletion  Patellar Region No depletion  Anterior Thigh Region No depletion  Posterior Calf Region No depletion  Edema (RD Assessment) Unable to assess  Hair Reviewed  Eyes Unable to assess  Mouth Unable to assess  Skin Reviewed  Nails Unable to assess   Diet Order:   Diet Order             Diet NPO time specified  Diet effective now                  EDUCATION NEEDS:  Not appropriate for education at this time  Skin:  Skin Assessment: Reviewed RN Assessment  Last BM:  1/22 - type 7  Height:  Ht Readings from Last 1 Encounters:  06/18/23 5' 7.99" (1.727 m)   Weight:  Wt Readings from Last 1 Encounters:  06/20/23 81.2 kg   Ideal Body Weight:  70 kg  BMI:  Body mass index is 27.23 kg/m.  Estimated Nutritional Needs:  Kcal:  2100-2300 kcal/d Protein:  105-120g/d Fluid:  >/=2.2L/d   Greig Castilla, RD, LDN Registered Dietitian II Please reach out via secure chat Weekend on-call pager # available in [facility]

## 2023-06-20 NOTE — TOC Initial Note (Signed)
Transition of Care [facility]) - Initial/Assessment Note    Patient Details  Name: Gary Conway MRN: [id number] Date of Birth: [birth year]  Transition of Care [facility]) CM/SW Contact:    Lockie Pares, RN Phone Number: 06/20/2023, 4:53 PM  Clinical Narrative:                  Presented with [facility], had pneumonia in October, Placed on Bipap, vomited  aspirated, intubated. Extubated  today history of spinal tumor benign 02/2023 f and DAH. Lives with roommate in a apartment, has mom and sister to call on, had DME sent last admit.   The patient will be discussed in daily progressive rounds, if a need is identified please place a TOC consult   Barriers to Discharge: Continued Medical Work up   Patient Goals and CMS Choice            Expected Discharge Plan and Services       Living arrangements for the past 2 months: Apartment                                      Prior Living Arrangements/Services Living arrangements for the past 2 months: Apartment Lives with:: Roommate Patient language and need for interpreter reviewed:: Yes        Need for Family Participation in Patient Care: Yes (Comment) Care giver support system in place?: Yes (comment)   Criminal Activity/Legal Involvement Pertinent to Current Situation/Hospitalization: No - Comment as needed  Activities of Daily Living   ADL Screening (condition at time of admission) Independently performs ADLs?: Yes (appropriate for developmental age) Is the patient deaf or have difficulty hearing?: No Does the patient have difficulty seeing, even when wearing glasses/contacts?: No Does the patient have difficulty concentrating, remembering, or making decisions?: No  Permission Sought/Granted                  Emotional Assessment   Attitude/Demeanor/Rapport: Intubated (Following Commands or Not Following Commands)   Orientation: : Fluctuating Orientation (Suspected and/or reported [employer])   Psych  Involvement: No (comment)  Admission diagnosis:  Respiratory distress [R06.03] Hypoxia [R09.02] Acute respiratory failure with hypoxemia (HCC) [J96.01] Pneumonia of both lungs due to infectious organism, unspecified part of lung [J18.9] Sepsis, due to unspecified organism, unspecified whether acute organ dysfunction present [facility]) [A41.9] Patient Active Problem List   Diagnosis Date Noted   Acute respiratory failure with hypoxemia (HCC) 06/17/2023   Influenza A 06/17/2023   ARDS (adult respiratory distress syndrome) (HCC) 06/17/2023   AKI (acute kidney injury) (HCC) 06/17/2023   Hemoptysis 04/05/2023   Diffuse 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   Acute respiratory failure with hypoxia (HCC) 04/02/2023   Acute hypoxemic respiratory failure (HCC) 03/25/2023   Severe sepsis (HCC) 03/22/2023   CAP (community acquired pneumonia) 03/22/2023   Diabetic ketoacidosis without coma associated with type 2 diabetes mellitus (HCC) 09/13/2019   Lactic acidosis 09/13/2019   Benign neoplasm of spinal cord (HCC) 03/27/2014   Obesity (BMI 30-39.9) 03/24/2014   DM (diabetes mellitus) (HCC) 03/24/2014   Spinal hemangioma 03/23/2014   Leukocytosis 03/23/2014   Paraplegia (HCC) 03/22/2014   Thoracic myelopathy 03/22/2014   Numbness and tingling of both legs 03/22/2014   Leg weakness, bilateral 03/22/2014   PCP:  Patient, No Pcp Per Pharmacy:   [id number] - [place] - [address] [place] [address]  Phone: [phone] Fax: [phone]  [facility] [address] [place] [address] Phone: [phone] Fax: [phone]     Social Drivers of Health (SDOH) Social History: SDOH Screenings   Food Insecurity: Patient Unable To Answer (06/17/2023)  Housing: Patient Unable To Answer (06/17/2023)  Transportation Needs: Patient Unable To Answer (06/17/2023)  Utilities: Patient Unable To Answer  (06/17/2023)  Tobacco Use: Low Risk  (05/02/2023)   SDOH Interventions:     Readmission Risk Interventions     No data to display

## 2023-06-21 ENCOUNTER — Telehealth: Payer: Self-pay | Admitting: Nurse Practitioner

## 2023-06-21 DIAGNOSIS — J101 Influenza due to other identified influenza virus with other respiratory manifestations: Secondary | ICD-10-CM

## 2023-06-21 LAB — GLUCOSE, CAPILLARY
Glucose-Capillary: 125 mg/dL — ABNORMAL HIGH (ref 70–99)
Glucose-Capillary: 150 mg/dL — ABNORMAL HIGH (ref 70–99)
Glucose-Capillary: 151 mg/dL — ABNORMAL HIGH (ref 70–99)
Glucose-Capillary: 162 mg/dL — ABNORMAL HIGH (ref 70–99)
Glucose-Capillary: 173 mg/dL — ABNORMAL HIGH (ref 70–99)
Glucose-Capillary: 176 mg/dL — ABNORMAL HIGH (ref 70–99)

## 2023-06-21 LAB — BASIC METABOLIC PANEL
Anion gap: 10 (ref 5–15)
BUN: 16 mg/dL (ref 6–20)
CO2: 25 mmol/L (ref 22–32)
Calcium: 8.4 mg/dL — ABNORMAL LOW (ref 8.9–10.3)
Chloride: 111 mmol/L (ref 98–111)
Creatinine, Ser: 1.43 mg/dL — ABNORMAL HIGH (ref 0.61–1.24)
GFR, Estimated: >60 mL/min (ref >60)
Glucose, Bld: 135 mg/dL — ABNORMAL HIGH (ref 70–99)
Potassium: 3.5 mmol/L (ref 3.5–5.1)
Sodium: 146 mmol/L — ABNORMAL HIGH (ref 135–145)

## 2023-06-21 LAB — CBC
HCT: 32.5 % — ABNORMAL LOW (ref 39.0–52.0)
Hemoglobin: 10.3 g/dL — ABNORMAL LOW (ref 13.0–17.0)
MCH: 22.6 pg — ABNORMAL LOW (ref 26.0–34.0)
MCHC: 31.7 g/dL (ref 30.0–36.0)
MCV: 71.4 fL — ABNORMAL LOW (ref 80.0–100.0)
Platelets: 112 10*3/uL — ABNORMAL LOW (ref 150–400)
RBC: 4.55 MIL/uL (ref 4.22–5.81)
RDW: 15.4 % (ref 11.5–15.5)
WBC: 5.2 10*3/uL (ref 4.0–10.5)
nRBC: 0 % (ref 0.0–0.2)

## 2023-06-21 MED ORDER — POTASSIUM CHLORIDE CRYS ER 20 MEQ PO TBCR
40.0000 meq | EXTENDED_RELEASE_TABLET | Freq: Once | ORAL | Status: AC
  Administered 2023-06-21: 40 meq via ORAL
  Filled 2023-06-21: qty 2

## 2023-06-21 MED ORDER — INSULIN ASPART 100 UNIT/ML IJ SOLN
0.0000 [IU] | Freq: Three times a day (TID) | INTRAMUSCULAR | Status: DC
Start: 2023-06-21 — End: 2023-06-24
  Administered 2023-06-21: 3 [IU] via SUBCUTANEOUS
  Administered 2023-06-21: 2 [IU] via SUBCUTANEOUS
  Administered 2023-06-22 – 2023-06-24 (×6): 3 [IU] via SUBCUTANEOUS

## 2023-06-21 MED ORDER — CARVEDILOL 25 MG PO TABS
25.0000 mg | ORAL_TABLET | Freq: Two times a day (BID) | ORAL | Status: DC
  Administered 2023-06-21 – 2023-06-24 (×6): 25 mg via ORAL
  Filled 2023-06-21 (×6): qty 1

## 2023-06-21 MED ORDER — CARVEDILOL 6.25 MG PO TABS
6.2500 mg | ORAL_TABLET | Freq: Once | ORAL | Status: AC
  Administered 2023-06-21: 6.25 mg via ORAL
  Filled 2023-06-21: qty 1

## 2023-06-21 NOTE — Telephone Encounter (Signed)
Could DB on 07/11/23 for me

## 2023-06-21 NOTE — Plan of Care (Signed)
Problem: Coping: Goal: Ability to adjust to condition or change in health will improve Outcome: Progressing   Problem: Fluid Volume: Goal: Ability to maintain a balanced intake and output will improve Outcome: Progressing

## 2023-06-21 NOTE — Progress Notes (Signed)
NAME:  Gary Conway, MRN:  [id number], DOB:  Jan 26, 1976, LOS: 4 ADMISSION DATE:  06/17/2023, CONSULTATION DATE:  06/17/23 REFERRING MD:  Freida Busman - EM, CHIEF COMPLAINT:  resp failure    History of Present Illness:  Hx from chart review due to degree of resp failure 48 yo M PMH recent DAH (10-03/2023) benign spinal cord tumor, DM, HTN presented to ED 1/20 via EMS for SOB. Onset unclear. With EMS he was hypoxic and SOB and placed on CPAP. Vomited in ED, remained hypoxic and placed on BiPAP.  PCCM consulted in this setting.   Pertinent  Medical History  HTN DAH DM  ARDS   Significant Hospital Events: Including procedures, antibiotic start and stop dates in addition to other pertinent events   1/20 to ED via EMS, resp failure, vomited on BiPAP, intubated, flu A + 1/22 off pressors  1/23 extubated  Interim History / Subjective:  No complaints or event overnight  Objective   Blood pressure (!) 153/108, pulse (!) 105, temperature 98.7 F (37.1 C), temperature source Oral, resp. rate (!) 23, height 5' 7.99" (1.727 m), weight 81.2 kg, SpO2 100%.        Intake/Output Summary (Last 24 hours) at 06/21/2023 0847 Last data filed at 06/21/2023 0800 Gross per 24 hour  Intake 1157.31 ml  Output 3025 ml  Net -1867.69 ml   Filed Weights   06/18/23 0500 06/19/23 0300 06/20/23 0800  Weight: 80.3 kg 81.8 kg 81.2 kg    Examination: General:  chronically ill appearing adult male sitting in bed in NAD HEENT: MM pink/moist Neuro: Alert, oriented, MAE CV: rr, ST 105 PULM:  non labored, few crackles on left mid/lower, 2L [place] at 100%> off maintaining sats >97% GI: soft, bs+, NT, male purwick- amber urine, rectal pouch Extremities: warm/dry, no tibial edema  Skin: no rashes  afebrile Labs>  UOP 2.8L/ 24hrs -1.6L/ 24hrs  Net -356 ml  Last BM charted 1/22 but has rectal pouch with liquid brown stool  1/20 BC> ngtd 1/20 trach asp> neg 1/20 RVP> +flu A 1/20 MRSA PCR > neg  Resolved  Hospital Problem list     Assessment & Plan:  Acute respiratory failure with hypoxia, improving Influenza pneumonia with severe ARDS, resolving  Severe sepsis with septic shock due to influenza pneumonia, POA Possible aspiration pneumonia Lactic acidosis, resolved Demand cardiac ischemia Diabetes type 2 with hyperglycemia Hypocalcemia/hypocalcemia/hypophosphatemia AKI CKD stage IIIa, improving  Anemia and thrombocytopenia of critical illness AoC thrombocytopenia  - extubated 1/23, doing well today, weaned to room air.  Supplemental O2 prn.  Aggressive pulm hygiene- IS, cont mobilization - completes 5 days azithro/ ceftriaxone and tamiflu today - trend clinically  - remains hemodynamically stable, slight hypertensive, increase coreg 12.5mg  BID to 25mg  BID - H/H remains stable, trend on CBC.  No signs of bleeding - renal function continues to improve below prior baseline/ PTA  - hold lasix today, euvolemic on exam, Na 149.  Monitor daily  - encourage PO intake - Trend BMP / urinary output - Replace electrolytes as indicated - Avoid nephrotoxic agents, ensure adequate renal perfusion - cont semglee 15u BID - SSI mod change to [facility] - PT/ OT - change bowel regimen to prn given liquid stools - stable for transfer to telemetry and to [facility] as of 1/25  Best Practice (right click and "Reselect all SmartList Selections" daily)   Diet/type: Regular consistency (see orders) DVT prophylaxis prophylactic heparin  Pressure ulcer(s): pressure ulcer assessment deferred  GI prophylaxis: PPI Lines:  N/A Foley:  N/A Code Status:  full code Last date of multidisciplinary goals of care discussion [1/20]  Labs   CBC: Recent Labs  Lab 06/17/23 0911 06/17/23 0915 06/18/23 0504 06/18/23 0649 06/18/23 1228 06/19/23 0432 06/21/23 0512  WBC 13.0*  --   --  9.5  --  6.0 5.2  NEUTROABS 12.1*  --   --   --   --   --   --   HGB 11.6*  12.6*   < > 8.5* 10.7* 9.5* 9.7* 10.3*  HCT 37.4*  37.0*   <  > 25.0* 32.9* 28.0* 29.6* 32.5*  MCV 73.2*  --   --  69.9*  --  69.6* 71.4*  PLT 133*  --   --  117*  --  97* 112*   < > = values in this interval not displayed.    Basic Metabolic Panel: Recent Labs  Lab 06/17/23 0911 06/17/23 0915 06/17/23 1853 06/18/23 0504 06/18/23 1610 06/18/23 1228 06/19/23 0432 06/20/23 0456 06/21/23 0512  NA 137  137   < >  --    < > 140 143 143 143 146*  K 4.3  4.4   < >  --    < > 3.7 3.6 3.5 3.9 3.5  CL 102  104  --   --   --  106  --  112* 112* 111  CO2 15*  --   --   --  23  --  22 22 25   GLUCOSE 363*  361*  --   --   --  162*  --  180* 349* 135*  BUN 31*  29*  --   --   --  34*  --  27* 24* 16  CREATININE 2.51*  2.40*  --   --   --  2.27*  --  1.66* 1.44* 1.43*  CALCIUM 8.5*  --   --   --  8.5*  --  7.8* 7.9* 8.4*  MG  --   --  1.9  --  2.1  --  2.1 2.5*  --   PHOS  --   --  3.2  --  1.8*  --  3.1  --   --    < > = values in this interval not displayed.   GFR: Estimated Creatinine Clearance: 60.5 mL/min (A) (by C-G formula based on SCr of 1.43 mg/dL (H)). Recent Labs  Lab 06/17/23 0911 06/17/23 1249 06/17/23 1344 06/17/23 1527 06/18/23 0649 06/19/23 0432 06/21/23 0512  PROCALCITON  --  21.61  --   --   --   --   --   WBC 13.0*  --   --   --  9.5 6.0 5.2  LATICACIDVEN 9.8*  --  3.4* 4.3*  --   --   --     Liver Function Tests: Recent Labs  Lab 06/17/23 0911  AST 242*  ALT 81*  ALKPHOS 75  BILITOT 1.8*  PROT 7.0  ALBUMIN 3.3*   No results for input(s): "LIPASE", "AMYLASE" in the last 168 hours. No results for input(s): "AMMONIA" in the last 168 hours.  ABG    Component Value Date/Time   PHART 7.396 06/18/2023 1228   PCO2ART 36.3 06/18/2023 1228   PO2ART 84 06/18/2023 1228   HCO3 22.2 06/18/2023 1228   TCO2 23 06/18/2023 1228   ACIDBASEDEF 2.0 06/18/2023 1228   O2SAT 96 06/18/2023 1228     Coagulation Profile: Recent Labs  Lab 06/17/23 0911  INR 1.3*    Cardiac Enzymes: No results for input(s):  "CKTOTAL", "CKMB", "CKMBINDEX", "TROPONINI" in the last 168 hours.  HbA1C: Hgb A1c MFr Bld  Date/Time Value Ref Range Status  03/22/2023 06:06 PM 8.1 (H) 4.8 - 5.6 % Final    Comment:    (NOTE) Pre diabetes:          5.7%-6.4%  Diabetes:              >6.4%  Glycemic control for   <7.0% adults with diabetes   09/13/2019 02:26 AM 12.9 (H) 4.8 - 5.6 % Final    Comment:    (NOTE) Pre diabetes:          5.7%-6.4% Diabetes:              >6.4% Glycemic control for   <7.0% adults with diabetes     CBG: Recent Labs  Lab 06/20/23 1605 06/20/23 1952 06/20/23 2348 06/21/23 0355 06/21/23 0732  GLUCAP 195* 249* 153* 176* 125*   CCT: n/a   Posey Boyer, MSN, AG-ACNP-BC [facility] 06/21/2023, 8:47 AM  See Amion for pager If no response to pager , please call [phone] until 7pm After 7:00 pm call Elink  [phone]

## 2023-06-22 LAB — CBC
HCT: 32.4 % — ABNORMAL LOW (ref 39.0–52.0)
Hemoglobin: 10.1 g/dL — ABNORMAL LOW (ref 13.0–17.0)
MCH: 22.4 pg — ABNORMAL LOW (ref 26.0–34.0)
MCHC: 31.2 g/dL (ref 30.0–36.0)
MCV: 72 fL — ABNORMAL LOW (ref 80.0–100.0)
Platelets: 153 10*3/uL (ref 150–400)
RBC: 4.5 MIL/uL (ref 4.22–5.81)
RDW: 15.2 % (ref 11.5–15.5)
WBC: 5.8 10*3/uL (ref 4.0–10.5)
nRBC: 0 % (ref 0.0–0.2)

## 2023-06-22 LAB — BASIC METABOLIC PANEL
Anion gap: 10 (ref 5–15)
BUN: 11 mg/dL (ref 6–20)
CO2: 22 mmol/L (ref 22–32)
Calcium: 8.3 mg/dL — ABNORMAL LOW (ref 8.9–10.3)
Chloride: 109 mmol/L (ref 98–111)
Creatinine, Ser: 1.22 mg/dL (ref 0.61–1.24)
GFR, Estimated: >60 mL/min (ref >60)
Glucose, Bld: 179 mg/dL — ABNORMAL HIGH (ref 70–99)
Potassium: 3.6 mmol/L (ref 3.5–5.1)
Sodium: 141 mmol/L (ref 135–145)

## 2023-06-22 LAB — GLUCOSE, CAPILLARY
Glucose-Capillary: 178 mg/dL — ABNORMAL HIGH (ref 70–99)
Glucose-Capillary: 170 mg/dL — ABNORMAL HIGH (ref 70–99)
Glucose-Capillary: 184 mg/dL — ABNORMAL HIGH (ref 70–99)
Glucose-Capillary: 245 mg/dL — ABNORMAL HIGH (ref 70–99)

## 2023-06-22 LAB — CULTURE, BLOOD (ROUTINE X 2)
Culture: NO GROWTH
Culture: NO GROWTH

## 2023-06-22 NOTE — Plan of Care (Signed)
  Problem: Respiratory: Goal: Ability to maintain a clear airway and adequate ventilation will improve Outcome: Progressing   Problem: Fluid Volume: Goal: Ability to maintain a balanced intake and output will improve Outcome: Progressing   Problem: Nutritional: Goal: Maintenance of adequate nutrition will improve Outcome: Progressing   Problem: Tissue Perfusion: Goal: Adequacy of tissue perfusion will improve Outcome: Progressing

## 2023-06-22 NOTE — Progress Notes (Addendum)
Progress Note    Gary Conway  QIO:[id number] DOB: Feb 04, 1976  DOA: 06/17/2023 PCP: Patient, No Pcp Per      Brief Narrative:    Medical records reviewed and are as summarized below:  Gary Conway is a 48 y.o. male PMH recent DAH (10-03/2023) benign spinal cord tumor, DM, HTN presented to ED 1/20 via EMS for SOB. Onset unclear. With EMS he was hypoxic and SOB and placed on CPAP. Vomited in ED, remained hypoxic and placed on BiPAP    He was intubated and placed on mechanical ventilation acute respiratory failure    Assessment/Plan:   Principal Problem:   Acute respiratory failure with hypoxemia (HCC) Active Problems:   Influenza A   ARDS (adult respiratory distress syndrome) (HCC)   AKI (acute kidney injury) (HCC)   Nutrition Problem: Inadequate oral intake Etiology: inability to eat  Signs/Symptoms: NPO status   Body mass index is 28.03 kg/m.   Acute hypoxic respiratory failure, ARDS: Improved.  S/p extubation on 06/20/2023.  Oxygen saturation on room air was 89%.  Oxygen saturation closely.   Severe sepsis secondary to influenza pneumonia, probable aspiration pneumonia: Completed Tamiflu and antibiotics Lactic acidosis: Likely from sepsis   Anemia of chronic disease: H&H stable Thrombocytopenia: Resolved   Hypocalcemia, hypokalemia, hypophosphatemia: Improved   AKI: Improved   Elevated troponin: Likely from demand ischemia   Insulin-dependent diabetes mellitus: Continue Semglee and NovoLog as needed   Hypertension: Continue carvedilol   General Weakness: Patient is still very weak.  Continue PT.  Encourage ambulation.   Diet Order             Diet Carb Modified Fluid consistency: Thin; Room service appropriate? Yes  Diet effective now                            Consultants: Intensivist  Procedures: Intubation and mechanical ventilation on 06/17/2023    Medications:    carvedilol  25 mg Oral BID WC    Chlorhexidine Gluconate Cloth  6 each Topical Daily   docusate sodium  100 mg Oral BID   folic acid  1 mg Oral Daily   heparin  5,000 Units Subcutaneous Q8H   insulin aspart  0-15 Units Subcutaneous TID WC   insulin glargine-yfgn  15 Units Subcutaneous BID   multivitamin with minerals  1 tablet Oral Daily   polyethylene glycol  17 g Oral Daily   thiamine  100 mg Oral Daily   Continuous Infusions:   Anti-infectives (From admission, onward)    Start     Dose/Rate Route Frequency Ordered Stop   06/20/23 2200  oseltamivir (TAMIFLU) capsule 75 mg  Status:  Discontinued        75 mg Oral 2 times daily 06/20/23 1253 06/20/23 1254   06/20/23 2200  oseltamivir (TAMIFLU) capsule 75 mg        75 mg Oral 2 times daily 06/20/23 1254 06/21/23 2113   06/20/23 1000  oseltamivir (TAMIFLU) 6 MG/ML suspension 75 mg  Status:  Discontinued        75 mg Per Tube 2 times daily 06/20/23 0732 06/20/23 1253   06/19/23 1000  oseltamivir (TAMIFLU) 6 MG/ML suspension 30 mg  Status:  Discontinued        30 mg Per Tube 2 times daily 06/18/23 2220 06/20/23 0732   06/18/23 1000  cefTRIAXone (ROCEPHIN) 2 g in sodium chloride 0.9 % 100 mL IVPB  2 g 200 mL/hr over 30 Minutes Intravenous Every 24 hours 06/17/23 1550 06/21/23 0945   06/18/23 0800  azithromycin (ZITHROMAX) 500 mg in sodium chloride 0.9 % 250 mL IVPB        500 mg 250 mL/hr over 60 Minutes Intravenous Every 24 hours 06/17/23 1650 06/21/23 0849   06/17/23 2200  oseltamivir (TAMIFLU) capsule 30 mg  Status:  Discontinued        30 mg Per Tube 2 times daily 06/17/23 1127 06/18/23 2220   06/17/23 1215  oseltamivir (TAMIFLU) capsule 75 mg        75 mg Oral  Once 06/17/23 1127 06/17/23 1208   06/17/23 1100  oseltamivir (TAMIFLU) capsule 30 mg  Status:  Discontinued        30 mg Per Tube 2 times daily 06/17/23 1047 06/17/23 1127   06/17/23 0915  cefTRIAXone (ROCEPHIN) 2 g in sodium chloride 0.9 % 100 mL IVPB        2 g 200 mL/hr over 30 Minutes  Intravenous Once 06/17/23 0911 06/17/23 0956   06/17/23 0915  azithromycin (ZITHROMAX) 500 mg in sodium chloride 0.9 % 250 mL IVPB        500 mg 250 mL/hr over 60 Minutes Intravenous  Once 06/17/23 0911 06/17/23 1142              Family Communication/Anticipated D/C date and plan/Code Status   DVT prophylaxis: heparin injection 5,000 Units Start: 06/17/23 1400 SCDs Start: 06/17/23 1043     Code Status: Full Code  Family Communication: None Disposition Plan: Plan to discharge home   Status is: Inpatient Remains inpatient appropriate because:         Subjective:   Interval events noted.  He complains of general weakness.  Breathing is better.  No shortness of breath or chest pain.  Objective:    Vitals:   06/22/23 1113 06/22/23 1200 06/22/23 1300 06/22/23 1400  BP:   (!) 132/107 128/88  Pulse:      Resp:  (!) 27 16 20   Temp: 97.9 F (36.6 C)     TempSrc: Oral     SpO2:      Weight:      Height:       No data found.   Intake/Output Summary (Last 24 hours) at 06/22/2023 1459 Last data filed at 06/22/2023 0800 Gross per 24 hour  Intake 0 ml  Output 1250 ml  Net -1250 ml   Filed Weights   06/20/23 0800 06/21/23 0706 06/22/23 0500  Weight: 81.2 kg 83.2 kg 83.6 kg    Exam:  GEN: NAD SKIN: No rash EYES: No pallor or icterus ENT: MMM CV: RRR PULM: B/l rhonchi ABD: soft, ND, NT, +BS CNS: AAO x 3, non focal EXT: No edema or tenderness        Data Reviewed:   I have personally reviewed following labs and imaging studies:  Labs: Labs show the following:   Basic Metabolic Panel: Recent Labs  Lab 06/17/23 1853 06/18/23 0504 06/18/23 0649 06/18/23 1228 06/19/23 0432 06/20/23 0456 06/21/23 0512 06/22/23 0441  NA  --    < > 140 143 143 143 146* 141  K  --    < > 3.7 3.6 3.5 3.9 3.5 3.6  CL  --   --  106  --  112* 112* 111 109  CO2  --   --  23  --  22 22 25 22   GLUCOSE  --   --  162*  --  180* 349* 135* 179*  BUN  --   --  34*   --  27* 24* 16 11  CREATININE  --   --  2.27*  --  1.66* 1.44* 1.43* 1.22  CALCIUM  --   --  8.5*  --  7.8* 7.9* 8.4* 8.3*  MG 1.9  --  2.1  --  2.1 2.5*  --   --   PHOS 3.2  --  1.8*  --  3.1  --   --   --    < > = values in this interval not displayed.   GFR Estimated Creatinine Clearance: 77.2 mL/min (by C-G formula based on SCr of 1.22 mg/dL). Liver Function Tests: Recent Labs  Lab 06/17/23 0911  AST 242*  ALT 81*  ALKPHOS 75  BILITOT 1.8*  PROT 7.0  ALBUMIN 3.3*   No results for input(s): "LIPASE", "AMYLASE" in the last 168 hours. No results for input(s): "AMMONIA" in the last 168 hours. Coagulation profile Recent Labs  Lab 06/17/23 0911  INR 1.3*    CBC: Recent Labs  Lab 06/17/23 0911 06/17/23 0915 06/18/23 0649 06/18/23 1228 06/19/23 0432 06/21/23 0512 06/22/23 0441  WBC 13.0*  --  9.5  --  6.0 5.2 5.8  NEUTROABS 12.1*  --   --   --   --   --   --   HGB 11.6*  12.6*   < > 10.7* 9.5* 9.7* 10.3* 10.1*  HCT 37.4*  37.0*   < > 32.9* 28.0* 29.6* 32.5* 32.4*  MCV 73.2*  --  69.9*  --  69.6* 71.4* 72.0*  PLT 133*  --  117*  --  97* 112* 153   < > = values in this interval not displayed.   Cardiac Enzymes: No results for input(s): "CKTOTAL", "CKMB", "CKMBINDEX", "TROPONINI" in the last 168 hours. BNP (last 3 results) No results for input(s): "PROBNP" in the last 8760 hours. CBG: Recent Labs  Lab 06/21/23 1539 06/21/23 2004 06/21/23 2341 06/22/23 0729 06/22/23 1112  GLUCAP 151* 162* 173* 178* 170*   D-Dimer: No results for input(s): "DDIMER" in the last 72 hours. Hgb A1c: No results for input(s): "HGBA1C" in the last 72 hours. Lipid Profile: No results for input(s): "CHOL", "HDL", "LDLCALC", "TRIG", "CHOLHDL", "LDLDIRECT" in the last 72 hours. Thyroid function studies: No results for input(s): "TSH", "T4TOTAL", "T3FREE", "THYROIDAB" in the last 72 hours.  Invalid input(s): "FREET3" Anemia work up: No results for input(s): "VITAMINB12", "FOLATE",  "FERRITIN", "TIBC", "IRON", "RETICCTPCT" in the last 72 hours. Sepsis Labs: Recent Labs  Lab 06/17/23 0911 06/17/23 1249 06/17/23 1344 06/17/23 1527 06/18/23 0649 06/19/23 0432 06/21/23 0512 06/22/23 0441  PROCALCITON  --  21.61  --   --   --   --   --   --   WBC 13.0*  --   --   --  9.5 6.0 5.2 5.8  LATICACIDVEN 9.8*  --  3.4* 4.3*  --   --   --   --     Microbiology Recent Results (from the past 240 hours)  Culture, blood (Routine X 2) w Reflex to ID Panel     Status: None   Collection Time: 06/17/23  9:06 AM   Specimen: BLOOD LEFT HAND  Result Value Ref Range Status   Specimen Description BLOOD LEFT HAND  Final   Special Requests   Final    BOTTLES DRAWN AEROBIC AND ANAEROBIC Blood Culture results may not be optimal due to an inadequate  volume of blood received in culture bottles   Culture   Final    NO GROWTH 5 DAYS Performed at [facility], [address], [place], [place] [address]    Report Status 06/22/2023 FINAL  Final  Culture, blood (Routine X 2) w Reflex to ID Panel     Status: None   Collection Time: 06/17/23  9:15 AM   Specimen: BLOOD  Result Value Ref Range Status   Specimen Description BLOOD SITE NOT SPECIFIED  Final   Special Requests   Final    BOTTLES DRAWN AEROBIC AND ANAEROBIC Blood Culture results may not be optimal due to an inadequate volume of blood received in culture bottles   Culture   Final    NO GROWTH 5 DAYS Performed at [facility], [address], [place], [place] [address]    Report Status 06/22/2023 FINAL  Final  Resp panel by RT-PCR (RSV, Flu A&B, Covid) Anterior Nasal Swab     Status: Abnormal   Collection Time: 06/17/23  9:36 AM   Specimen: Anterior Nasal Swab  Result Value Ref Range Status   SARS Coronavirus 2 by RT PCR NEGATIVE NEGATIVE Final   Influenza A by PCR POSITIVE (A) NEGATIVE Final   Influenza B by PCR NEGATIVE NEGATIVE Final    Comment: (NOTE) The Xpert Xpress SARS-CoV-2/FLU/RSV plus assay is intended  as an aid in the diagnosis of influenza from Nasopharyngeal swab specimens and should not be used as a sole basis for treatment. Nasal washings and aspirates are unacceptable for Xpert Xpress SARS-CoV-2/FLU/RSV testing.  Fact Sheet for Patients: BloggerCourse.com  Fact Sheet for Healthcare Providers: SeriousBroker.it  This test is not yet approved or cleared by the [place] FDA and has been authorized for detection and/or diagnosis of SARS-CoV-2 by FDA under an Emergency Use Authorization (EUA). This EUA will remain in effect (meaning this test can be used) for the duration of the COVID-19 declaration under Section 564(b)(1) of the Act, 21 U.S.C. section 360bbb-3(b)(1), unless the authorization is terminated or revoked.     Resp Syncytial Virus by PCR NEGATIVE NEGATIVE Final    Comment: (NOTE) Fact Sheet for Patients: BloggerCourse.com  Fact Sheet for Healthcare Providers: SeriousBroker.it  This test is not yet approved or cleared by the [place] FDA and has been authorized for detection and/or diagnosis of SARS-CoV-2 by FDA under an Emergency Use Authorization (EUA). This EUA will remain in effect (meaning this test can be used) for the duration of the COVID-19 declaration under Section 564(b)(1) of the Act, 21 U.S.C. section 360bbb-3(b)(1), unless the authorization is terminated or revoked.  Performed at [facility], [address], [place], [place] [address]   Respiratory (~20 pathogens) panel by PCR     Status: Abnormal   Collection Time: 06/17/23 10:44 AM   Specimen: Nasopharyngeal Swab; Respiratory  Result Value Ref Range Status   Adenovirus NOT DETECTED NOT DETECTED Final   Coronavirus 229E NOT DETECTED NOT DETECTED Final    Comment: (NOTE) The Coronavirus on the Respiratory Panel, DOES NOT test for the novel  Coronavirus (2019 nCoV)     Coronavirus HKU1 NOT DETECTED NOT DETECTED Final   Coronavirus NL63 NOT DETECTED NOT DETECTED Final   Coronavirus OC43 NOT DETECTED NOT DETECTED Final   Metapneumovirus NOT DETECTED NOT DETECTED Final   Rhinovirus / Enterovirus NOT DETECTED NOT DETECTED Final   Influenza A H1 2009 DETECTED (A) NOT DETECTED Final   Influenza B NOT DETECTED NOT DETECTED  Final   Parainfluenza Virus 1 NOT DETECTED NOT DETECTED Final   Parainfluenza Virus 2 NOT DETECTED NOT DETECTED Final   Parainfluenza Virus 3 NOT DETECTED NOT DETECTED Final   Parainfluenza Virus 4 NOT DETECTED NOT DETECTED Final   Respiratory Syncytial Virus NOT DETECTED NOT DETECTED Final   Bordetella pertussis NOT DETECTED NOT DETECTED Final   Bordetella Parapertussis NOT DETECTED NOT DETECTED Final   Chlamydophila pneumoniae NOT DETECTED NOT DETECTED Final   Mycoplasma pneumoniae NOT DETECTED NOT DETECTED Final    Comment: Performed at [facility], [address] [place], [place] [address]  Culture, Respiratory w Gram Stain     Status: None   Collection Time: 06/17/23 11:21 AM   Specimen: Tracheal Aspirate; Respiratory  Result Value Ref Range Status   Specimen Description TRACHEAL ASPIRATE  Final   Special Requests NONE  Final   Gram Stain   Final    ABUNDANT WBC PRESENT, PREDOMINANTLY PMN NO ORGANISMS SEEN    Culture   Final    Normal respiratory flora-no Staph aureus or Pseudomonas seen Performed at [facility], [address], [place], [place] [address]    Report Status 06/19/2023 FINAL  Final  MRSA Next Gen by PCR, Nasal     Status: None   Collection Time: 06/17/23 12:56 PM   Specimen: Nasal Mucosa; Nasal Swab  Result Value Ref Range Status   MRSA by PCR Next Gen NOT DETECTED NOT DETECTED Final    Comment: (NOTE) The GeneXpert MRSA Assay (FDA approved for NASAL specimens only), is one component of a comprehensive MRSA colonization surveillance program. It is not intended to diagnose MRSA infection nor to  guide or monitor treatment for MRSA infections. Test performance is not FDA approved in patients less than 20 years old. Performed at [facility], [address] [place], [place] [address]     Procedures and diagnostic studies:  No results found.             LOS: 5 days   Gary Conway  [facility]   Pager on [email or web address]. If 7PM-7AM, please contact night-coverage at [email or web address]     06/22/2023, 2:59 PM

## 2023-06-23 ENCOUNTER — Encounter (HOSPITAL_COMMUNITY): Payer: Self-pay | Admitting: Internal Medicine

## 2023-06-23 ENCOUNTER — Other Ambulatory Visit: Payer: Self-pay

## 2023-06-23 LAB — GLUCOSE, CAPILLARY
Glucose-Capillary: 109 mg/dL — ABNORMAL HIGH (ref 70–99)
Glucose-Capillary: 151 mg/dL — ABNORMAL HIGH (ref 70–99)
Glucose-Capillary: 194 mg/dL — ABNORMAL HIGH (ref 70–99)
Glucose-Capillary: 201 mg/dL — ABNORMAL HIGH (ref 70–99)
Glucose-Capillary: 81 mg/dL (ref 70–99)

## 2023-06-23 MED ORDER — FUROSEMIDE 40 MG PO TABS
80.0000 mg | ORAL_TABLET | Freq: Every day | ORAL | Status: DC
  Administered 2023-06-24: 80 mg via ORAL
  Filled 2023-06-23: qty 2

## 2023-06-23 NOTE — Progress Notes (Addendum)
Progress Note    Gary Conway  ZOX:[id number] DOB: 06-03-1975  DOA: 06/17/2023 PCP: Patient, No Pcp Per      Brief Narrative:    Medical records reviewed and are as summarized below:  Gary Conway is a 48 y.o. male PMH recent DAH (10-03/2023) benign spinal cord tumor, DM, HTN, chronic diastolic CHF, presented to ED 8/11 via EMS for SOB. Onset unclear. With EMS he was hypoxic and SOB and placed on CPAP. Vomited in ED, remained hypoxic and placed on BiPAP    He was intubated and placed on mechanical ventilation acute respiratory failure    Assessment/Plan:   Principal Problem:   Acute respiratory failure with hypoxemia (HCC) Active Problems:   Influenza A   ARDS (adult respiratory distress syndrome) (HCC)   AKI (acute kidney injury) (HCC)   Nutrition Problem: Inadequate oral intake Etiology: inability to eat  Signs/Symptoms: NPO status   Body mass index is 28.03 kg/m.   Acute on chronic hypoxic respiratory failure, ARDS: Improved.  S/p extubation on 06/20/2023.   Oxygen saturation on room air at rest 92%, on room air with ambulation was 84% and on 3 L of oxygen with ambulation was 92%.  He will need home oxygen.   Severe sepsis secondary to influenza pneumonia, probable aspiration pneumonia: Completed Tamiflu and antibiotics Lactic acidosis: Likely from sepsis   Anemia of chronic disease: H&H stable Thrombocytopenia: Resolved   Hypocalcemia, hypokalemia, hypophosphatemia: Improved   AKI: Improved   Elevated troponin: Likely from demand ischemia   Insulin-dependent diabetes mellitus: Continue Semglee and NovoLog as needed   Hypertension: Continue carvedilol   Chronic diastolic CHF: Restart home Lasix at 80 mg daily.   General Weakness: Patient is still very weak.  He was evaluated by PT on 06/20/2023.  Requested reevaluation by PT because of persistent weakness.     Of note, patient was discharged to go home today.  However, he said he  did not feel ready to go home today because of general weakness.   Diet Order             Diet - low sodium heart healthy           Diet Carb Modified Fluid consistency: Thin; Room service appropriate? Yes  Diet effective now                            Consultants: Intensivist  Procedures: Intubation and mechanical ventilation on 06/17/2023    Medications:    carvedilol  25 mg Oral BID WC   Chlorhexidine Gluconate Cloth  6 each Topical Daily   docusate sodium  100 mg Oral BID   folic acid  1 mg Oral Daily   [START ON 06/24/2023] furosemide  80 mg Oral Daily   heparin  5,000 Units Subcutaneous Q8H   insulin aspart  0-15 Units Subcutaneous TID WC   insulin glargine-yfgn  15 Units Subcutaneous BID   multivitamin with minerals  1 tablet Oral Daily   polyethylene glycol  17 g Oral Daily   thiamine  100 mg Oral Daily   Continuous Infusions:   Anti-infectives (From admission, onward)    Start     Dose/Rate Route Frequency Ordered Stop   06/20/23 2200  oseltamivir (TAMIFLU) capsule 75 mg  Status:  Discontinued        75 mg Oral 2 times daily 06/20/23 1253 06/20/23 1254   06/20/23 2200  oseltamivir (TAMIFLU) capsule 75 mg  75 mg Oral 2 times daily 06/20/23 1254 06/21/23 2113   06/20/23 1000  oseltamivir (TAMIFLU) 6 MG/ML suspension 75 mg  Status:  Discontinued        75 mg Per Tube 2 times daily 06/20/23 0732 06/20/23 1253   06/19/23 1000  oseltamivir (TAMIFLU) 6 MG/ML suspension 30 mg  Status:  Discontinued        30 mg Per Tube 2 times daily 06/18/23 2220 06/20/23 0732   06/18/23 1000  cefTRIAXone (ROCEPHIN) 2 g in sodium chloride 0.9 % 100 mL IVPB        2 g 200 mL/hr over 30 Minutes Intravenous Every 24 hours 06/17/23 1550 06/21/23 0945   06/18/23 0800  azithromycin (ZITHROMAX) 500 mg in sodium chloride 0.9 % 250 mL IVPB        500 mg 250 mL/hr over 60 Minutes Intravenous Every 24 hours 06/17/23 1650 06/21/23 0849   06/17/23 2200  oseltamivir  (TAMIFLU) capsule 30 mg  Status:  Discontinued        30 mg Per Tube 2 times daily 06/17/23 1127 06/18/23 2220   06/17/23 1215  oseltamivir (TAMIFLU) capsule 75 mg        75 mg Oral  Once 06/17/23 1127 06/17/23 1208   06/17/23 1100  oseltamivir (TAMIFLU) capsule 30 mg  Status:  Discontinued        30 mg Per Tube 2 times daily 06/17/23 1047 06/17/23 1127   06/17/23 0915  cefTRIAXone (ROCEPHIN) 2 g in sodium chloride 0.9 % 100 mL IVPB        2 g 200 mL/hr over 30 Minutes Intravenous Once 06/17/23 0911 06/17/23 0956   06/17/23 0915  azithromycin (ZITHROMAX) 500 mg in sodium chloride 0.9 % 250 mL IVPB        500 mg 250 mL/hr over 60 Minutes Intravenous  Once 06/17/23 0911 06/17/23 1142              Family Communication/Anticipated D/C date and plan/Code Status   DVT prophylaxis: heparin injection 5,000 Units Start: 06/17/23 1400 SCDs Start: 06/17/23 1043     Code Status: Full Code  Family Communication: None Disposition Plan: Plan to discharge home   Status is: Inpatient Remains inpatient appropriate because:         Subjective:   No acute events overnight.  No shortness of breath or chest pain.  He complains of general weakness.  Objective:    Vitals:   06/23/23 0900 06/23/23 1000 06/23/23 1100 06/23/23 1113  BP: (!) 156/107 (!) 124/92 105/78   Pulse:  (!) 110 94   Resp: (!) 21 (!) 27 (!) 25   Temp:    98.6 F (37 C)  TempSrc:    Oral  SpO2:  93% 90%   Weight:      Height:       No data found.   Intake/Output Summary (Last 24 hours) at 06/23/2023 1427 Last data filed at 06/23/2023 0600 Gross per 24 hour  Intake --  Output 300 ml  Net -300 ml   Filed Weights   06/21/23 0706 06/22/23 0500 06/23/23 0312  Weight: 83.2 kg 83.6 kg 83.6 kg    Exam:  GEN: NAD SKIN: Warm and dry EYES: No pallor or icterus ENT: MMM CV: RRR PULM: CTA B ABD: soft, ND, NT, +BS CNS: AAO x 3, non focal EXT: No edema or tenderness        Data Reviewed:   I  have personally reviewed following labs and  imaging studies:  Labs: Labs show the following:   Basic Metabolic Panel: Recent Labs  Lab 06/17/23 1853 06/18/23 0504 06/18/23 0649 06/18/23 1228 06/19/23 0432 06/20/23 0456 06/21/23 0512 06/22/23 0441  NA  --    < > 140 143 143 143 146* 141  K  --    < > 3.7 3.6 3.5 3.9 3.5 3.6  CL  --   --  106  --  112* 112* 111 109  CO2  --   --  23  --  22 22 25 22   GLUCOSE  --   --  162*  --  180* 349* 135* 179*  BUN  --   --  34*  --  27* 24* 16 11  CREATININE  --   --  2.27*  --  1.66* 1.44* 1.43* 1.22  CALCIUM  --   --  8.5*  --  7.8* 7.9* 8.4* 8.3*  MG 1.9  --  2.1  --  2.1 2.5*  --   --   PHOS 3.2  --  1.8*  --  3.1  --   --   --    < > = values in this interval not displayed.   GFR Estimated Creatinine Clearance: 77.2 mL/min (by C-G formula based on SCr of 1.22 mg/dL). Liver Function Tests: Recent Labs  Lab 06/17/23 0911  AST 242*  ALT 81*  ALKPHOS 75  BILITOT 1.8*  PROT 7.0  ALBUMIN 3.3*   No results for input(s): "LIPASE", "AMYLASE" in the last 168 hours. No results for input(s): "AMMONIA" in the last 168 hours. Coagulation profile Recent Labs  Lab 06/17/23 0911  INR 1.3*    CBC: Recent Labs  Lab 06/17/23 0911 06/17/23 0915 06/18/23 0649 06/18/23 1228 06/19/23 0432 06/21/23 0512 06/22/23 0441  WBC 13.0*  --  9.5  --  6.0 5.2 5.8  NEUTROABS 12.1*  --   --   --   --   --   --   HGB 11.6*  12.6*   < > 10.7* 9.5* 9.7* 10.3* 10.1*  HCT 37.4*  37.0*   < > 32.9* 28.0* 29.6* 32.5* 32.4*  MCV 73.2*  --  69.9*  --  69.6* 71.4* 72.0*  PLT 133*  --  117*  --  97* 112* 153   < > = values in this interval not displayed.   Cardiac Enzymes: No results for input(s): "CKTOTAL", "CKMB", "CKMBINDEX", "TROPONINI" in the last 168 hours. BNP (last 3 results) No results for input(s): "PROBNP" in the last 8760 hours. CBG: Recent Labs  Lab 06/22/23 1112 06/22/23 1524 06/22/23 2156 06/23/23 0727 06/23/23 1112  GLUCAP  170* 184* 245* 109* 151*   D-Dimer: No results for input(s): "DDIMER" in the last 72 hours. Hgb A1c: No results for input(s): "HGBA1C" in the last 72 hours. Lipid Profile: No results for input(s): "CHOL", "HDL", "LDLCALC", "TRIG", "CHOLHDL", "LDLDIRECT" in the last 72 hours. Thyroid function studies: No results for input(s): "TSH", "T4TOTAL", "T3FREE", "THYROIDAB" in the last 72 hours.  Invalid input(s): "FREET3" Anemia work up: No results for input(s): "VITAMINB12", "FOLATE", "FERRITIN", "TIBC", "IRON", "RETICCTPCT" in the last 72 hours. Sepsis Labs: Recent Labs  Lab 06/17/23 0911 06/17/23 1249 06/17/23 1344 06/17/23 1527 06/18/23 0649 06/19/23 0432 06/21/23 0512 06/22/23 0441  PROCALCITON  --  21.61  --   --   --   --   --   --   WBC 13.0*  --   --   --  9.5  6.0 5.2 5.8  LATICACIDVEN 9.8*  --  3.4* 4.3*  --   --   --   --     Microbiology Recent Results (from the past 240 hours)  Culture, blood (Routine X 2) w Reflex to ID Panel     Status: None   Collection Time: 06/17/23  9:06 AM   Specimen: BLOOD LEFT HAND  Result Value Ref Range Status   Specimen Description BLOOD LEFT HAND  Final   Special Requests   Final    BOTTLES DRAWN AEROBIC AND ANAEROBIC Blood Culture results may not be optimal due to an inadequate volume of blood received in culture bottles   Culture   Final    NO GROWTH 5 DAYS Performed at [facility], [address] [place], [place] [address]    Report Status 06/22/2023 FINAL  Final  Culture, blood (Routine X 2) w Reflex to ID Panel     Status: None   Collection Time: 06/17/23  9:15 AM   Specimen: BLOOD  Result Value Ref Range Status   Specimen Description BLOOD SITE NOT SPECIFIED  Final   Special Requests   Final    BOTTLES DRAWN AEROBIC AND ANAEROBIC Blood Culture results may not be optimal due to an inadequate volume of blood received in culture bottles   Culture   Final    NO GROWTH 5 DAYS Performed at [facility], [address] [place], [place] [address]    Report Status 06/22/2023 FINAL  Final  Resp panel by RT-PCR (RSV, Flu A&B, Covid) Anterior Nasal Swab     Status: Abnormal   Collection Time: 06/17/23  9:36 AM   Specimen: Anterior Nasal Swab  Result Value Ref Range Status   SARS Coronavirus 2 by RT PCR NEGATIVE NEGATIVE Final   Influenza A by PCR POSITIVE (A) NEGATIVE Final   Influenza B by PCR NEGATIVE NEGATIVE Final    Comment: (NOTE) The Xpert Xpress SARS-CoV-2/FLU/RSV plus assay is intended as an aid in the diagnosis of influenza from Nasopharyngeal swab specimens and should not be used as a sole basis for treatment. Nasal washings and aspirates are unacceptable for Xpert Xpress SARS-CoV-2/FLU/RSV testing.  Fact Sheet for Patients: BloggerCourse.com  Fact Sheet for Healthcare Providers: SeriousBroker.it  This test is not yet approved or cleared by the [place] FDA and has been authorized for detection and/or diagnosis of SARS-CoV-2 by FDA under an Emergency Use Authorization (EUA). This EUA will remain in effect (meaning this test can be used) for the duration of the COVID-19 declaration under Section 564(b)(1) of the Act, 21 U.S.C. section 360bbb-3(b)(1), unless the authorization is terminated or revoked.     Resp Syncytial Virus by PCR NEGATIVE NEGATIVE Final    Comment: (NOTE) Fact Sheet for Patients: BloggerCourse.com  Fact Sheet for Healthcare Providers: SeriousBroker.it  This test is not yet approved or cleared by the [place] FDA and has been authorized for detection and/or diagnosis of SARS-CoV-2 by FDA under an Emergency Use Authorization (EUA). This EUA will remain in effect (meaning this test can be used) for the duration of the COVID-19 declaration under Section 564(b)(1) of the Act, 21 U.S.C. section 360bbb-3(b)(1), unless the authorization is terminated  or revoked.  Performed at [facility], [address] [place], [place] [address]   Respiratory (~20 pathogens) panel by PCR     Status: Abnormal   Collection Time: 06/17/23 10:44 AM   Specimen: Nasopharyngeal Swab; Respiratory  Result Value  Ref Range Status   Adenovirus NOT DETECTED NOT DETECTED Final   Coronavirus 229E NOT DETECTED NOT DETECTED Final    Comment: (NOTE) The Coronavirus on the Respiratory Panel, DOES NOT test for the novel  Coronavirus (2019 nCoV)    Coronavirus HKU1 NOT DETECTED NOT DETECTED Final   Coronavirus NL63 NOT DETECTED NOT DETECTED Final   Coronavirus OC43 NOT DETECTED NOT DETECTED Final   Metapneumovirus NOT DETECTED NOT DETECTED Final   Rhinovirus / Enterovirus NOT DETECTED NOT DETECTED Final   Influenza A H1 2009 DETECTED (A) NOT DETECTED Final   Influenza B NOT DETECTED NOT DETECTED Final   Parainfluenza Virus 1 NOT DETECTED NOT DETECTED Final   Parainfluenza Virus 2 NOT DETECTED NOT DETECTED Final   Parainfluenza Virus 3 NOT DETECTED NOT DETECTED Final   Parainfluenza Virus 4 NOT DETECTED NOT DETECTED Final   Respiratory Syncytial Virus NOT DETECTED NOT DETECTED Final   Bordetella pertussis NOT DETECTED NOT DETECTED Final   Bordetella Parapertussis NOT DETECTED NOT DETECTED Final   Chlamydophila pneumoniae NOT DETECTED NOT DETECTED Final   Mycoplasma pneumoniae NOT DETECTED NOT DETECTED Final    Comment: Performed at [facility], [address], [place], [place] [address]  Culture, Respiratory w Gram Stain     Status: None   Collection Time: 06/17/23 11:21 AM   Specimen: Tracheal Aspirate; Respiratory  Result Value Ref Range Status   Specimen Description TRACHEAL ASPIRATE  Final   Special Requests NONE  Final   Gram Stain   Final    ABUNDANT WBC PRESENT, PREDOMINANTLY PMN NO ORGANISMS SEEN    Culture   Final    Normal respiratory flora-no Staph aureus or Pseudomonas seen Performed at [facility], [address],  [place], [place] [address]    Report Status 06/19/2023 FINAL  Final  MRSA Next Gen by PCR, Nasal     Status: None   Collection Time: 06/17/23 12:56 PM   Specimen: Nasal Mucosa; Nasal Swab  Result Value Ref Range Status   MRSA by PCR Next Gen NOT DETECTED NOT DETECTED Final    Comment: (NOTE) The GeneXpert MRSA Assay (FDA approved for NASAL specimens only), is one component of a comprehensive MRSA colonization surveillance program. It is not intended to diagnose MRSA infection nor to guide or monitor treatment for MRSA infections. Test performance is not FDA approved in patients less than 47 years old. Performed at [facility], [address], [place], [place] [address]     Procedures and diagnostic studies:  No results found.             LOS: 6 days   Karol Skarzynski  [facility]   Pager on [email or web address]. If 7PM-7AM, please contact night-coverage at [email or web address]     06/23/2023, 2:27 PM

## 2023-06-23 NOTE — Plan of Care (Signed)
  Problem: Education: Goal: Knowledge of General Education information will improve Description: Including pain rating scale, medication(s)/side effects and non-pharmacologic comfort measures Outcome: Progressing   Problem: Health Behavior/Discharge Planning: Goal: Ability to manage health-related needs will improve Outcome: Progressing   Problem: Clinical Measurements: Goal: Ability to maintain clinical measurements within normal limits will improve Outcome: Progressing Goal: Will remain free from infection Outcome: Progressing Goal: Diagnostic test results will improve Outcome: Progressing Goal: Respiratory complications will improve Outcome: Progressing Goal: Cardiovascular complication will be avoided Outcome: Progressing   Problem: Activity: Goal: Risk for activity intolerance will decrease Outcome: Progressing   Problem: Nutrition: Goal: Adequate nutrition will be maintained Outcome: Progressing   Problem: Coping: Goal: Level of anxiety will decrease Outcome: Progressing   Problem: Elimination: Goal: Will not experience complications related to bowel motility Outcome: Progressing Goal: Will not experience complications related to urinary retention Outcome: Progressing   Problem: Pain Managment: Goal: General experience of comfort will improve and/or be controlled Outcome: Progressing   Problem: Safety: Goal: Ability to remain free from injury will improve Outcome: Progressing   Problem: Skin Integrity: Goal: Risk for impaired skin integrity will decrease Outcome: Progressing   Problem: Education: Goal: Ability to describe self-care measures that may prevent or decrease complications (Diabetes Survival Skills Education) will improve Outcome: Progressing Goal: Individualized Educational Video(s) Outcome: Progressing   Problem: Coping: Goal: Ability to adjust to condition or change in health will improve Outcome: Progressing   Problem: Fluid  Volume: Goal: Ability to maintain a balanced intake and output will improve Outcome: Progressing   Problem: Metabolic: Goal: Ability to maintain appropriate glucose levels will improve Outcome: Progressing   Problem: Nutritional: Goal: Maintenance of adequate nutrition will improve Outcome: Progressing Goal: Progress toward achieving an optimal weight will improve Outcome: Progressing   Problem: Skin Integrity: Goal: Risk for impaired skin integrity will decrease Outcome: Progressing

## 2023-06-23 NOTE — Plan of Care (Signed)
  Problem: Activity: Goal: Ability to tolerate increased activity will improve Outcome: Progressing   Problem: Respiratory: Goal: Ability to maintain a clear airway and adequate ventilation will improve Outcome: Progressing   Problem: Role Relationship: Goal: Method of communication will improve Outcome: Progressing   Problem: Education: Goal: Ability to describe self-care measures that may prevent or decrease complications (Diabetes Survival Skills Education) will improve Outcome: Progressing Goal: Individualized Educational Video(s) Outcome: Progressing   Problem: Coping: Goal: Ability to adjust to condition or change in health will improve Outcome: Progressing   Problem: Fluid Volume: Goal: Ability to maintain a balanced intake and output will improve Outcome: Progressing   Problem: Health Behavior/Discharge Planning: Goal: Ability to identify and utilize available resources and services will improve Outcome: Progressing Goal: Ability to manage health-related needs will improve Outcome: Progressing   Problem: Metabolic: Goal: Ability to maintain appropriate glucose levels will improve Outcome: Progressing   Problem: Nutritional: Goal: Maintenance of adequate nutrition will improve Outcome: Progressing Goal: Progress toward achieving an optimal weight will improve Outcome: Progressing   Problem: Skin Integrity: Goal: Risk for impaired skin integrity will decrease Outcome: Progressing   Problem: Tissue Perfusion: Goal: Adequacy of tissue perfusion will improve Outcome: Progressing   Problem: Education: Goal: Knowledge of General Education information will improve Description: Including pain rating scale, medication(s)/side effects and non-pharmacologic comfort measures Outcome: Progressing   Problem: Health Behavior/Discharge Planning: Goal: Ability to manage health-related needs will improve Outcome: Progressing   Problem: Clinical Measurements: Goal:  Ability to maintain clinical measurements within normal limits will improve Outcome: Progressing Goal: Will remain free from infection Outcome: Progressing Goal: Diagnostic test results will improve Outcome: Progressing Goal: Respiratory complications will improve Outcome: Progressing Goal: Cardiovascular complication will be avoided Outcome: Progressing   Problem: Activity: Goal: Risk for activity intolerance will decrease Outcome: Progressing   Problem: Nutrition: Goal: Adequate nutrition will be maintained Outcome: Progressing   Problem: Coping: Goal: Level of anxiety will decrease Outcome: Progressing   Problem: Elimination: Goal: Will not experience complications related to bowel motility Outcome: Progressing Goal: Will not experience complications related to urinary retention Outcome: Progressing   Problem: Pain Managment: Goal: General experience of comfort will improve and/or be controlled Outcome: Progressing   Problem: Safety: Goal: Ability to remain free from injury will improve Outcome: Progressing   Problem: Skin Integrity: Goal: Risk for impaired skin integrity will decrease Outcome: Progressing

## 2023-06-23 NOTE — Progress Notes (Signed)
SATURATION QUALIFICATIONS: (This note is used to comply with regulatory documentation for home oxygen)  Patient Saturations on Room Air at Rest = 92%  Patient Saturations on Room Air while Ambulating = 84%  Patient Saturations on 3 Liters of oxygen while Ambulating = 92%  Please briefly explain why patient needs home oxygen:

## 2023-06-24 LAB — GLUCOSE, CAPILLARY
Glucose-Capillary: 130 mg/dL — ABNORMAL HIGH (ref 70–99)
Glucose-Capillary: 152 mg/dL — ABNORMAL HIGH (ref 70–99)
Glucose-Capillary: 193 mg/dL — ABNORMAL HIGH (ref 70–99)

## 2023-06-24 NOTE — Discharge Summary (Addendum)
Physician Discharge Summary  Jeffey Janssen PZW:[id number] DOB: 1975/09/23 DOA: 06/17/2023  PCP: Patient, No Pcp Per  Admit date: 06/17/2023  Discharge date: 06/24/2023  Admitted From: Home.  Disposition:  Home.  Recommendations for Outpatient Follow-up:  Follow up with PCP in 1-2 weeks Please obtain BMP/CBC in one week  Home Health: None Equipment/Devices:None  Discharge Condition: Stable CODE STATUS:Full code Diet recommendation: Heart Healthy  Brief Summary/ Hospital Course: Loretta Kluender is a 48 y.o. male with PMH recent DAH (10-03/2023) benign spinal cord tumor, DM, HTN, chronic diastolic CHF, presented to ED 4/23 via EMS for SOB. Onset unclear. With EMS he was hypoxic and SOB and placed on CPAP. Vomited in ED, remained hypoxic and placed on BiPAP. He was intubated and placed on mechanical ventilation acute respiratory failure.  Patient was initially managed in the ICU. Subsequently he was extubated and moved out of ICU.  Patient remains hemodynamically stable.  He is successfully weaned down to room air.  Patient completed antibiotics course.  H&H remains stable.  Patient is being discharged home.   Discharge Diagnoses:  Principal Problem:   Acute respiratory failure with hypoxemia (HCC) Active Problems:   Influenza A   ARDS (adult respiratory distress syndrome) (HCC)   AKI (acute kidney injury) (HCC)  Acute on chronic hypoxic respiratory failure, ARDS: Improved.  S/p extubation on 06/20/2023.   Oxygen saturation on room air at rest 92%, on room air with ambulation was 84% and on 3 L of oxygen with ambulation was 92%.   Oxygen has improved.  Patient remains on room air.   Severe sepsis secondary to influenza pneumonia, probable aspiration pneumonia:  Completed Tamiflu and antibiotics Lactic acidosis: Likely from sepsis now resolved.    Anemia of chronic disease: H&H stable.  Thrombocytopenia: Resolved   Hypocalcemia, hypokalemia, hypophosphatemia: Improved    AKI: Improved. Resolved.   Elevated troponin: Likely from demand ischemia, No workup recommended.   Insulin-dependent diabetes mellitus: Continue Semglee and NovoLog as needed   Hypertension: Continue carvedilol   Chronic diastolic CHF: Restart home Lasix at 80 mg daily.     General Weakness: Patient is still very weak.  He was evaluated by PT on 06/20/2023.   Requested reevaluation by PT because of persistent weakness.       Discharge Instructions  Discharge Instructions     Call MD for:  difficulty breathing, headache or visual disturbances   Complete by: As directed    Call MD for:  persistant dizziness or light-headedness   Complete by: As directed    Call MD for:  persistant nausea and vomiting   Complete by: As directed    Diet - low sodium heart healthy   Complete by: As directed    Diet - low sodium heart healthy   Complete by: As directed    Diet Carb Modified   Complete by: As directed    Discharge instructions   Complete by: As directed    Follow-up with primary care physician in 1 week. Advised to continue current medicines as prescribed.   Increase activity slowly   Complete by: As directed    Increase activity slowly   Complete by: As directed       Allergies as of 06/24/2023   No Known Allergies      Medication List     TAKE these medications    Accu-Chek Guide w/Device Kit Use to test blood sugar in the morning, at noon, and at bedtime.   BD Pen Needle Nano U/F 32G  X 4 MM Misc Generic drug: Insulin Pen Needle For 4 times a day insulin Subcutaneously.   blood glucose meter kit and supplies Kit Dispense based on patient and insurance preference. Use up to four times daily as directed. (FOR ICD-9 250.00, 250.01). For QAC - HS accuchecks.   carvedilol 12.5 MG tablet Commonly known as: COREG Take 1 tablet (12.5 mg total) by mouth 2 (two) times daily with a meal.   FeroSul 325 (65 Fe) MG tablet Generic drug: ferrous sulfate Take 1 tablet  (325 mg total) by mouth daily with breakfast.   furosemide 80 MG tablet Commonly known as: LASIX Take 1 tablet (80 mg total) by mouth daily.   HumaLOG KwikPen 100 UNIT/ML KwikPen Generic drug: insulin lispro Before each meal, inject subcutaneously 3 times a day:  140-199 - 2 units, 200-250 - 6 units, 251-299 - 8 units,  300-349 - 10 units,  350 or above 14 units.   ipratropium-albuterol 0.5-2.5 (3) MG/3ML Soln Commonly known as: DUONEB Use twice a day scheduled and every 4 hours as needed for shortness of breath and wheezing.   Lantus SoloStar 100 UNIT/ML Solostar Pen Generic drug: insulin glargine Inject 35 Units into the skin daily.               Durable Medical Equipment  (From admission, onward)           Start     Ordered   06/23/23 1327  DME Oxygen  Once       Question Answer Comment  Length of Need Lifetime   Mode or (Route) Nasal cannula   Liters per Minute 3   Frequency Continuous (stationary and portable oxygen unit needed)   Oxygen conserving device Yes   Oxygen delivery system Gas      06/23/23 1326            Follow-up Information     [facility] Follow up.   Why: Will need hospital follow up and to establish a primary care doctor. They have finacial counselling and a pharmacy Contact information: [address] [place] [address] [phone]               No Known Allergies  Consultations: None   Procedures/Studies: DG CHEST PORT 1 VIEW Result Date: 06/19/2023 CLINICAL DATA:  Hypercapnic respiratory failure. EXAM: PORTABLE CHEST 1 VIEW COMPARISON:  06/17/2023 FINDINGS: Endotracheal tube has tip 2.9 cm above the carina. Enteric tube courses into the stomach and has tip and side-port over the stomach in the left upper quadrant. Small caliber catheter projects with tip over the left axilla of uncertain clinical significance. This may represent a PICC line. Mild stable  elevation of the right hemidiaphragm. Lungs are somewhat hypoinflated demonstrate continued patchy bilateral multifocal airspace process likely multifocal infection. Possible small amount right pleural fluid. Cardiomediastinal silhouette and remainder of the exam is unchanged. IMPRESSION: 1. Continued patchy bilateral multifocal airspace process likely multifocal infection. Possible small amount right pleural fluid. 2. Support apparatus as described. Small caliber catheter with tip projecting over the left axilla of uncertain clinical significance. This may represent a left-sided PICC line. Recommend clinical correlation. Electronically Signed   By: Elberta Fortis M.D.   On: 06/19/2023 13:48   Portable Chest x-ray Result Date: 06/17/2023 CLINICAL DATA:  Endotracheal tube present [id number] EXAM: PORTABLE CHEST 1 VIEW COMPARISON:  06/17/2023 FINDINGS: Endotracheal tube tip is at the carina and appears to be pointing towards the right  mainstem bronchus. Bilateral airspace densities, left side greater than right. Heart size is within normal limits. Nasogastric tube extends into the abdomen. Negative for a pneumothorax. IMPRESSION: 1. Endotracheal tube tip is at the carina and appears to be pointing towards the right mainstem bronchus. Recommend retraction. This result was discussed with the patient's ICU nurse, Harrold Donath, at 11:34 a.m. on 06/17/2023. 2. Bilateral airspace densities. Findings are concerning for multifocal pneumonia. Electronically Signed   By: Richarda Overlie M.D.   On: 06/17/2023 11:35   DG Abd Portable 1V Result Date: 06/17/2023 CLINICAL DATA:  Encounter for imaging study to confirm nasogastric (NG) tube placement [id number] EXAM: PORTABLE ABDOMEN - 1 VIEW COMPARISON:  03/24/2023 and chest radiograph 06/17/2023 FINDINGS: Nasogastric tube tip is in left upper abdomen likely in the gastric fundus region. Gas-filled loops of bowel in the upper abdomen. Bilateral airspace densities in the lungs, left side greater  than right. Tip of the endotracheal tube is at the carina and may be pointing towards the right mainstem bronchus. IMPRESSION: 1. Nasogastric tube tip in the gastric fundus region. 2. Tip of the endotracheal tube is at the carina and may be pointing towards the right mainstem bronchus. Recommend retraction. Electronically Signed   By: Richarda Overlie M.D.   On: 06/17/2023 11:32   DG Chest Port 1 View Result Date: 06/17/2023 CLINICAL DATA:  Shortness of breath EXAM: PORTABLE CHEST 1 VIEW COMPARISON:  03/29/2023 FINDINGS: Low volume chest with bilateral airspace disease similar to prior. There is asymmetric elevation of the right diaphragm. Normal heart size and mediastinal contours. No visible effusion or pneumothorax. Artifact from EKG leads. IMPRESSION: Low volume chest with bilateral airspace disease similar to 03/31/2023. Electronically Signed   By: Tiburcio Pea M.D.   On: 06/17/2023 09:20    Subjective: Seen and examined at bedside.  Overnight events noted.   Patient reports feeling much improved and wants to be discharged.  Discharge Exam: Vitals:   06/24/23 0933 06/24/23 1159  BP: (!) 148/97   Pulse: (!) 116   Resp:    Temp:  98.6 F (37 C)  SpO2:     Vitals:   06/24/23 0600 06/24/23 0734 06/24/23 0933 06/24/23 1159  BP:   (!) 148/97   Pulse:   (!) 116   Resp: 20     Temp:  98.7 F (37.1 C)  98.6 F (37 C)  TempSrc:  Oral  Oral  SpO2: 95%     Weight:      Height:        General: Pt is alert, awake, not in acute distress Cardiovascular: RRR, S1/S2 +, no rubs, no gallops Respiratory: CTA bilaterally, no wheezing, no rhonchi Abdominal: Soft, NT, ND, bowel sounds + Extremities: no edema, no cyanosis    The results of significant diagnostics from this hospitalization (including imaging, microbiology, ancillary and laboratory) are listed below for reference.     Microbiology: Recent Results (from the past 240 hours)  Culture, blood (Routine X 2) w Reflex to ID Panel      Status: None   Collection Time: 06/17/23  9:06 AM   Specimen: BLOOD LEFT HAND  Result Value Ref Range Status   Specimen Description BLOOD LEFT HAND  Final   Special Requests   Final    BOTTLES DRAWN AEROBIC AND ANAEROBIC Blood Culture results may not be optimal due to an inadequate volume of blood received in culture bottles   Culture   Final    NO GROWTH 5 DAYS Performed at  [facility], [address] [place], [place] [address]    Report Status 06/22/2023 FINAL  Final  Culture, blood (Routine X 2) w Reflex to ID Panel     Status: None   Collection Time: 06/17/23  9:15 AM   Specimen: BLOOD  Result Value Ref Range Status   Specimen Description BLOOD SITE NOT SPECIFIED  Final   Special Requests   Final    BOTTLES DRAWN AEROBIC AND ANAEROBIC Blood Culture results may not be optimal due to an inadequate volume of blood received in culture bottles   Culture   Final    NO GROWTH 5 DAYS Performed at [facility], [address], [place], [place] [address]    Report Status 06/22/2023 FINAL  Final  Resp panel by RT-PCR (RSV, Flu A&B, Covid) Anterior Nasal Swab     Status: Abnormal   Collection Time: 06/17/23  9:36 AM   Specimen: Anterior Nasal Swab  Result Value Ref Range Status   SARS Coronavirus 2 by RT PCR NEGATIVE NEGATIVE Final   Influenza A by PCR POSITIVE (A) NEGATIVE Final   Influenza B by PCR NEGATIVE NEGATIVE Final    Comment: (NOTE) The Xpert Xpress SARS-CoV-2/FLU/RSV plus assay is intended as an aid in the diagnosis of influenza from Nasopharyngeal swab specimens and should not be used as a sole basis for treatment. Nasal washings and aspirates are unacceptable for Xpert Xpress SARS-CoV-2/FLU/RSV testing.  Fact Sheet for Patients: BloggerCourse.com  Fact Sheet for Healthcare Providers: SeriousBroker.it  This test is not yet approved or cleared by the [place] FDA and has been authorized for detection  and/or diagnosis of SARS-CoV-2 by FDA under an Emergency Use Authorization (EUA). This EUA will remain in effect (meaning this test can be used) for the duration of the COVID-19 declaration under Section 564(b)(1) of the Act, 21 U.S.C. section 360bbb-3(b)(1), unless the authorization is terminated or revoked.     Resp Syncytial Virus by PCR NEGATIVE NEGATIVE Final    Comment: (NOTE) Fact Sheet for Patients: BloggerCourse.com  Fact Sheet for Healthcare Providers: SeriousBroker.it  This test is not yet approved or cleared by the [place] FDA and has been authorized for detection and/or diagnosis of SARS-CoV-2 by FDA under an Emergency Use Authorization (EUA). This EUA will remain in effect (meaning this test can be used) for the duration of the COVID-19 declaration under Section 564(b)(1) of the Act, 21 U.S.C. section 360bbb-3(b)(1), unless the authorization is terminated or revoked.  Performed at [facility] Lab, [address], [place], [place] [address]   Respiratory (~20 pathogens) panel by PCR     Status: Abnormal   Collection Time: 06/17/23 10:44 AM   Specimen: Nasopharyngeal Swab; Respiratory  Result Value Ref Range Status   Adenovirus NOT DETECTED NOT DETECTED Final   Coronavirus 229E NOT DETECTED NOT DETECTED Final    Comment: (NOTE) The Coronavirus on the Respiratory Panel, DOES NOT test for the novel  Coronavirus (2019 nCoV)    Coronavirus HKU1 NOT DETECTED NOT DETECTED Final   Coronavirus NL63 NOT DETECTED NOT DETECTED Final   Coronavirus OC43 NOT DETECTED NOT DETECTED Final   Metapneumovirus NOT DETECTED NOT DETECTED Final   Rhinovirus / Enterovirus NOT DETECTED NOT DETECTED Final   Influenza A H1 2009 DETECTED (A) NOT DETECTED Final   Influenza B NOT DETECTED NOT DETECTED Final   Parainfluenza Virus 1 NOT DETECTED NOT DETECTED Final   Parainfluenza Virus 2 NOT DETECTED NOT DETECTED Final  Parainfluenza  Virus 3 NOT DETECTED NOT DETECTED Final   Parainfluenza Virus 4 NOT DETECTED NOT DETECTED Final   Respiratory Syncytial Virus NOT DETECTED NOT DETECTED Final   Bordetella pertussis NOT DETECTED NOT DETECTED Final   Bordetella Parapertussis NOT DETECTED NOT DETECTED Final   Chlamydophila pneumoniae NOT DETECTED NOT DETECTED Final   Mycoplasma pneumoniae NOT DETECTED NOT DETECTED Final    Comment: Performed at [facility], [address], [place], [place] [address]  Culture, Respiratory w Gram Stain     Status: None   Collection Time: 06/17/23 11:21 AM   Specimen: Tracheal Aspirate; Respiratory  Result Value Ref Range Status   Specimen Description TRACHEAL ASPIRATE  Final   Special Requests NONE  Final   Gram Stain   Final    ABUNDANT WBC PRESENT, PREDOMINANTLY PMN NO ORGANISMS SEEN    Culture   Final    Normal respiratory flora-no Staph aureus or Pseudomonas seen Performed at [facility] Lab, [address], [place], [place] [address]    Report Status 06/19/2023 FINAL  Final  MRSA Next Gen by PCR, Nasal     Status: None   Collection Time: 06/17/23 12:56 PM   Specimen: Nasal Mucosa; Nasal Swab  Result Value Ref Range Status   MRSA by PCR Next Gen NOT DETECTED NOT DETECTED Final    Comment: (NOTE) The GeneXpert MRSA Assay (FDA approved for NASAL specimens only), is one component of a comprehensive MRSA colonization surveillance program. It is not intended to diagnose MRSA infection nor to guide or monitor treatment for MRSA infections. Test performance is not FDA approved in patients less than 37 years old. Performed at [facility] Lab, [address] [place], [place] [address]      Labs: BNP (last 3 results) Recent Labs    03/24/23 0352 03/30/23 0442 06/17/23 1249  BNP 456.3* 58.7 375.6*   Basic Metabolic Panel: Recent Labs  Lab 06/17/23 1853 06/18/23 0504 06/18/23 0649 06/18/23 1228 06/19/23 0432 06/20/23 0456 06/21/23 0512 06/22/23 0441  NA  --     < > 140 143 143 143 146* 141  K  --    < > 3.7 3.6 3.5 3.9 3.5 3.6  CL  --   --  106  --  112* 112* 111 109  CO2  --   --  23  --  22 22 25 22   GLUCOSE  --   --  162*  --  180* 349* 135* 179*  BUN  --   --  34*  --  27* 24* 16 11  CREATININE  --   --  2.27*  --  1.66* 1.44* 1.43* 1.22  CALCIUM  --   --  8.5*  --  7.8* 7.9* 8.4* 8.3*  MG 1.9  --  2.1  --  2.1 2.5*  --   --   PHOS 3.2  --  1.8*  --  3.1  --   --   --    < > = values in this interval not displayed.   Liver Function Tests: No results for input(s): "AST", "ALT", "ALKPHOS", "BILITOT", "PROT", "ALBUMIN" in the last 168 hours. No results for input(s): "LIPASE", "AMYLASE" in the last 168 hours. No results for input(s): "AMMONIA" in the last 168 hours. CBC: Recent Labs  Lab 06/18/23 0649 06/18/23 1228 06/19/23 0432 06/21/23 0512 06/22/23 0441  WBC 9.5  --  6.0 5.2 5.8  HGB 10.7* 9.5* 9.7* 10.3* 10.1*  HCT  32.9* 28.0* 29.6* 32.5* 32.4*  MCV 69.9*  --  69.6* 71.4* 72.0*  PLT 117*  --  97* 112* 153   Cardiac Enzymes: No results for input(s): "CKTOTAL", "CKMB", "CKMBINDEX", "TROPONINI" in the last 168 hours. BNP: Invalid input(s): "POCBNP" CBG: Recent Labs  Lab 06/23/23 1516 06/23/23 2232 06/23/23 2237 06/24/23 0733 06/24/23 1209  GLUCAP 194* 81 201* 130* 152*   D-Dimer No results for input(s): "DDIMER" in the last 72 hours. Hgb A1c No results for input(s): "HGBA1C" in the last 72 hours. Lipid Profile No results for input(s): "CHOL", "HDL", "LDLCALC", "TRIG", "CHOLHDL", "LDLDIRECT" in the last 72 hours. Thyroid function studies No results for input(s): "TSH", "T4TOTAL", "T3FREE", "THYROIDAB" in the last 72 hours.  Invalid input(s): "FREET3" Anemia work up No results for input(s): "VITAMINB12", "FOLATE", "FERRITIN", "TIBC", "IRON", "RETICCTPCT" in the last 72 hours. Urinalysis    Component Value Date/Time   COLORURINE YELLOW 06/17/2023 1205   APPEARANCEUR HAZY (A) 06/17/2023 1205   LABSPEC 1.010  06/17/2023 1205   PHURINE 5.0 06/17/2023 1205   GLUCOSEU >=500 (A) 06/17/2023 1205   HGBUR SMALL (A) 06/17/2023 1205   BILIRUBINUR NEGATIVE 06/17/2023 1205   KETONESUR NEGATIVE 06/17/2023 1205   PROTEINUR 30 (A) 06/17/2023 1205   UROBILINOGEN 0.2 03/23/2014 1600   NITRITE NEGATIVE 06/17/2023 1205   LEUKOCYTESUR NEGATIVE 06/17/2023 1205   Sepsis Labs Recent Labs  Lab 06/18/23 0649 06/19/23 0432 06/21/23 0512 06/22/23 0441  WBC 9.5 6.0 5.2 5.8   Microbiology Recent Results (from the past 240 hours)  Culture, blood (Routine X 2) w Reflex to ID Panel     Status: None   Collection Time: 06/17/23  9:06 AM   Specimen: BLOOD LEFT HAND  Result Value Ref Range Status   Specimen Description BLOOD LEFT HAND  Final   Special Requests   Final    BOTTLES DRAWN AEROBIC AND ANAEROBIC Blood Culture results may not be optimal due to an inadequate volume of blood received in culture bottles   Culture   Final    NO GROWTH 5 DAYS Performed at [facility], [address] [place], [place] [address]    Report Status 06/22/2023 FINAL  Final  Culture, blood (Routine X 2) w Reflex to ID Panel     Status: None   Collection Time: 06/17/23  9:15 AM   Specimen: BLOOD  Result Value Ref Range Status   Specimen Description BLOOD SITE NOT SPECIFIED  Final   Special Requests   Final    BOTTLES DRAWN AEROBIC AND ANAEROBIC Blood Culture results may not be optimal due to an inadequate volume of blood received in culture bottles   Culture   Final    NO GROWTH 5 DAYS Performed at [facility], [address] [place], [place] [address]    Report Status 06/22/2023 FINAL  Final  Resp panel by RT-PCR (RSV, Flu A&B, Covid) Anterior Nasal Swab     Status: Abnormal   Collection Time: 06/17/23  9:36 AM   Specimen: Anterior Nasal Swab  Result Value Ref Range Status   SARS Coronavirus 2 by RT PCR NEGATIVE NEGATIVE Final   Influenza A by PCR POSITIVE (A) NEGATIVE Final   Influenza B by PCR NEGATIVE  NEGATIVE Final    Comment: (NOTE) The Xpert Xpress SARS-CoV-2/FLU/RSV plus assay is intended as an aid in the diagnosis of influenza from Nasopharyngeal swab specimens and should not be used as a sole basis for treatment. Nasal washings and aspirates are unacceptable  for Xpert Xpress SARS-CoV-2/FLU/RSV testing.  Fact Sheet for Patients: BloggerCourse.com  Fact Sheet for Healthcare Providers: SeriousBroker.it  This test is not yet approved or cleared by the [place] FDA and has been authorized for detection and/or diagnosis of SARS-CoV-2 by FDA under an Emergency Use Authorization (EUA). This EUA will remain in effect (meaning this test can be used) for the duration of the COVID-19 declaration under Section 564(b)(1) of the Act, 21 U.S.C. section 360bbb-3(b)(1), unless the authorization is terminated or revoked.     Resp Syncytial Virus by PCR NEGATIVE NEGATIVE Final    Comment: (NOTE) Fact Sheet for Patients: BloggerCourse.com  Fact Sheet for Healthcare Providers: SeriousBroker.it  This test is not yet approved or cleared by the [place] FDA and has been authorized for detection and/or diagnosis of SARS-CoV-2 by FDA under an Emergency Use Authorization (EUA). This EUA will remain in effect (meaning this test can be used) for the duration of the COVID-19 declaration under Section 564(b)(1) of the Act, 21 U.S.C. section 360bbb-3(b)(1), unless the authorization is terminated or revoked.  Performed at [facility], [address], [place], [place] [address]   Respiratory (~20 pathogens) panel by PCR     Status: Abnormal   Collection Time: 06/17/23 10:44 AM   Specimen: Nasopharyngeal Swab; Respiratory  Result Value Ref Range Status   Adenovirus NOT DETECTED NOT DETECTED Final   Coronavirus 229E NOT DETECTED NOT DETECTED Final    Comment: (NOTE) The Coronavirus  on the Respiratory Panel, DOES NOT test for the novel  Coronavirus (2019 nCoV)    Coronavirus HKU1 NOT DETECTED NOT DETECTED Final   Coronavirus NL63 NOT DETECTED NOT DETECTED Final   Coronavirus OC43 NOT DETECTED NOT DETECTED Final   Metapneumovirus NOT DETECTED NOT DETECTED Final   Rhinovirus / Enterovirus NOT DETECTED NOT DETECTED Final   Influenza A H1 2009 DETECTED (A) NOT DETECTED Final   Influenza B NOT DETECTED NOT DETECTED Final   Parainfluenza Virus 1 NOT DETECTED NOT DETECTED Final   Parainfluenza Virus 2 NOT DETECTED NOT DETECTED Final   Parainfluenza Virus 3 NOT DETECTED NOT DETECTED Final   Parainfluenza Virus 4 NOT DETECTED NOT DETECTED Final   Respiratory Syncytial Virus NOT DETECTED NOT DETECTED Final   Bordetella pertussis NOT DETECTED NOT DETECTED Final   Bordetella Parapertussis NOT DETECTED NOT DETECTED Final   Chlamydophila pneumoniae NOT DETECTED NOT DETECTED Final   Mycoplasma pneumoniae NOT DETECTED NOT DETECTED Final    Comment: Performed at [facility], [address] [place], [place] [address]  Culture, Respiratory w Gram Stain     Status: None   Collection Time: 06/17/23 11:21 AM   Specimen: Tracheal Aspirate; Respiratory  Result Value Ref Range Status   Specimen Description TRACHEAL ASPIRATE  Final   Special Requests NONE  Final   Gram Stain   Final    ABUNDANT WBC PRESENT, PREDOMINANTLY PMN NO ORGANISMS SEEN    Culture   Final    Normal respiratory flora-no Staph aureus or Pseudomonas seen Performed at [facility], [address] [place], [place] [address]    Report Status 06/19/2023 FINAL  Final  MRSA Next Gen by PCR, Nasal     Status: None   Collection Time: 06/17/23 12:56 PM   Specimen: Nasal Mucosa; Nasal Swab  Result Value Ref Range Status   MRSA by PCR Next Gen NOT DETECTED NOT DETECTED Final    Comment: (NOTE) The GeneXpert MRSA Assay (FDA approved for NASAL  specimens only), is one component of a comprehensive MRSA  colonization surveillance program. It is not intended to diagnose MRSA infection nor to guide or monitor treatment for MRSA infections. Test performance is not FDA approved in patients less than 67 years old. Performed at [facility], [address] [place], [place] [address]      Time coordinating discharge: Over 30 minutes  SIGNED:   Willeen Niece, MD  [facility] 06/24/2023, 2:27 PM Pager   If 7PM-7AM, please contact night-coverage [email or web address]

## 2023-06-24 NOTE — TOC Transition Note (Signed)
Transition of Care [facility]) - Discharge Note   Patient Details  Name: Gary Conway MRN: [id number] Date of Birth: 09/07/1975  Transition of Care [facility]) CM/SW Contact:  Tom-Johnson, Hershal Coria, RN Phone Number: 06/24/2023, 4:17 PM   Clinical Narrative:     Patient is scheduled for discharge today.  Readmission Risk Assessment done. Outpatient referrals, hospital f/u and discharge instructions on AVS. No TOC needs or recommendations noted. Mother, Donnella Sham to transport at discharge.  No further TOC needs noted.          Final next level of care: Home/Self Care Barriers to Discharge: Barriers Resolved   Patient Goals and CMS Choice Patient states their goals for this hospitalization and ongoing recovery are:: To return home CMS Medicare.gov Compare Post Acute Care list provided to:: Patient Choice offered to / list presented to : Patient      Discharge Placement                Patient to be transferred to facility by: Mother Name of family member notified: [place]    Discharge Plan and Services Additional resources added to the After Visit Summary for                  DME Arranged: N/A DME Agency: NA       HH Arranged: NA HH Agency: NA        Social Drivers of Health (SDOH) Interventions SDOH Screenings   Food Insecurity: Patient Unable To Answer (06/17/2023)  Housing: Patient Unable To Answer (06/17/2023)  Transportation Needs: Patient Unable To Answer (06/17/2023)  Utilities: Patient Unable To Answer (06/17/2023)  Tobacco Use: Low Risk  (06/23/2023)     Readmission Risk Interventions    06/24/2023    4:16 PM  Readmission Risk Prevention Plan  Transportation Screening Complete  PCP or Specialist Appt within 5-7 Days Complete  Home Care Screening Complete  Medication Review (RN CM) Referral to Pharmacy

## 2023-06-24 NOTE — Progress Notes (Signed)
Physical Therapy Treatment Patient Details Name: Gary Conway MRN: [id number] DOB: Aug 26, 1975 Today's Date: 06/24/2023   History of Present Illness Pt is a 48 y/o male presenting to the ED on 06/17/23 with shortness of breath. Placed on BiPAP and vomited, then intubated. Severe sepsis due to pna; vasopressor support; extubated 1/23  PMHx:  DMII, hemangioma of spine resected with laminectomy T2-4, HTN,    PT Comments  Progressing well towards acute functional goals, ambulating up to 150 feet this afternoon on room air, Sats at 94%. 7/10 RPE reported. RW for support. He does show some mild instability that didn't appear to be present during initial evaluation. Encouraged OOB frequently with staff. Patient will continue to benefit from skilled physical therapy services to further improve independence with functional mobility.     If plan is discharge home, recommend the following: Help with stairs or ramp for entrance;Assistance with cooking/housework   Can travel by private vehicle        Equipment Recommendations  None recommended by PT    Recommendations for Other Services       Precautions / Restrictions Precautions Precautions: Fall Precaution Comments: Monitor O2 (slow to obtain accurate pleth reading.) Restrictions Weight Bearing Restrictions Per Provider Order: No     Mobility  Bed Mobility Overal bed mobility: Modified Independent Bed Mobility: Supine to Sit, Sit to Supine     Supine to sit: Modified independent (Device/Increase time) Sit to supine: Modified independent (Device/Increase time)   General bed mobility comments: Mod I no assist.    Transfers Overall transfer level: Needs assistance Equipment used: Rolling walker (2 wheels) Transfers: Sit to/from Stand Sit to Stand: Supervision           General transfer comment: Supervision for safety, mild instability noted, able to self correct with hands on RW for support.     Ambulation/Gait Ambulation/Gait assistance: Supervision Gait Distance (Feet): 150 Feet Assistive device: Rolling walker (2 wheels) Gait Pattern/deviations: Step-through pattern, Decreased stride length, Drifts right/left Gait velocity: dec Gait velocity interpretation: <1.8 ft/sec, indicate of risk for recurrent falls   General Gait Details: Minor drift from straight path, very mild instability but adequate with bil UEs on RW for support. Educated on pacing, symptom awareness, and pursed lip breathing. Moderate dyspnea towards end of distance with RPE of 7/10 reported. SpO2 94% on RA.   Stairs             Wheelchair Mobility     Tilt Bed    Modified Rankin (Stroke Patients Only)       Balance Overall balance assessment: Needs assistance Sitting-balance support: No upper extremity supported, Feet supported Sitting balance-Leahy Scale: Good     Standing balance support: No upper extremity supported Standing balance-Leahy Scale: Fair Standing balance comment: CGA without UE support.                            Cognition Arousal: Alert Behavior During Therapy: Flat affect Overall Cognitive Status: Within Functional Limits for tasks assessed                                          Exercises      General Comments        Pertinent Vitals/Pain Pain Assessment Pain Assessment: No/denies pain    Home Living  Prior Function            PT Goals (current goals can now be found in the care plan section) Acute Rehab PT Goals Patient Stated Goal: return to apartment PT Goal Formulation: With patient Time For Goal Achievement: 07/04/23 Potential to Achieve Goals: Good Progress towards PT goals: Progressing toward goals    Frequency    Min 1X/week      PT Plan      Co-evaluation              AM-PAC PT "6 Clicks" Mobility   Outcome Measure  Help needed turning from your back to  your side while in a flat bed without using bedrails?: None Help needed moving from lying on your back to sitting on the side of a flat bed without using bedrails?: None Help needed moving to and from a bed to a chair (including a wheelchair)?: A Little Help needed standing up from a chair using your arms (e.g., wheelchair or bedside chair)?: A Little Help needed to walk in hospital room?: A Little Help needed climbing 3-5 steps with a railing? : A Little 6 Click Score: 20    End of Session   Activity Tolerance: Patient tolerated treatment well Patient left: with call bell/phone within reach;in bed;with bed alarm set (Declines sitting in recliner, wants to eat lunch in bed.) Nurse Communication: Mobility status PT Visit Diagnosis: Difficulty in walking, not elsewhere classified (R26.2)     Time: 2130-8657 PT Time Calculation (min) (ACUTE ONLY): 19 min  Charges:    $Gait Training: 8-22 mins PT General Charges $$ ACUTE PT VISIT: 1 Visit                     Kathlyn [place], PT, DPT [facility]  Rehabilitation Services Physical Therapist Office: [phone] Website: [email or web address]    Berton Mount 06/24/2023, 1:20 PM

## 2023-06-24 NOTE — Discharge Instructions (Signed)
Advised to follow-up with primary care physician in 1 week.

## 2023-06-24 NOTE — Telephone Encounter (Signed)
LM for PT top call to double book w/Ms. Parrett

## 2023-07-11 ENCOUNTER — Encounter: Payer: Self-pay | Admitting: Adult Health

## 2023-07-11 ENCOUNTER — Ambulatory Visit: Payer: Self-pay | Admitting: Adult Health

## 2023-07-11 ENCOUNTER — Ambulatory Visit (INDEPENDENT_AMBULATORY_CARE_PROVIDER_SITE_OTHER): Payer: Self-pay

## 2023-07-11 VITALS — BP 160/100 | HR 112 | Temp 98.1°F | Ht 68.0 in | Wt 184.2 lb

## 2023-07-11 DIAGNOSIS — J80 Acute respiratory distress syndrome: Secondary | ICD-10-CM

## 2023-07-11 DIAGNOSIS — J9601 Acute respiratory failure with hypoxia: Secondary | ICD-10-CM

## 2023-07-11 DIAGNOSIS — J101 Influenza due to other identified influenza virus with other respiratory manifestations: Secondary | ICD-10-CM

## 2023-07-11 DIAGNOSIS — J189 Pneumonia, unspecified organism: Secondary | ICD-10-CM

## 2023-07-11 NOTE — Patient Instructions (Addendum)
Chest xray today.  Call your primary provider to make follow up in next week.  Activity as tolerated.  Follow up with Dr. Celine Mans in 4-6 weeks with PFT and As needed

## 2023-07-11 NOTE — Assessment & Plan Note (Signed)
Resolved O2 saturations remain good today.  O2 sats are 100% on room air.  Chest x-ray is pending.  PFTs on return visit

## 2023-07-11 NOTE — Progress Notes (Signed)
@Patient  ID: Gary Conway, male    DOB: [birth year], 48 y.o.   MRN: [id number]  Chief Complaint  Patient presents with   Hospitalization Follow-up  Discussed the use of AI scribe software for clinical note transcription with the patient, who gave verbal consent to proceed.  Referring provider: No ref. provider found  HPI: 48 year old male never smoker seen for pulmonary critical care consult during hospitalization last month for acute hypoxic respiratory failure in the setting of influenza pneumonia with severe ARDS and severe sepsis with septic shock. Medical history significant for diabetes, chronic kidney disease, history of benign spinal tumor   TEST/EVENTS :  Hospitalization November 2024 for acute respiratory failure secondary to ARDS/DAH (diffuse alveolar hemorrhage), severe sepsis secondary to multifocal community-acquired pneumonia-required intubation and mechanical vent support-treated with aggressive IV antibiotics.  Required aggressive diuresis for decompensated heart failure. Treated for DKA.   07/11/2023 Follow up : Post hospital follow-up-acute respiratory failure, influenza pneumonia, ARDS, sepsis [name] is a 48 year old male with diabetes and chronic kidney disease who presents for a post-hospital follow-up after recent hospitalization for influenza pneumonia.  Complicated by severe ARDS, severe sepsis with septic shock requiring mechanical ventilatory support. He was recently hospitalized for influenza and pneumonia, requiring ventilator support. This was his second hospitalization necessitating ventilation, the first being in the fall due to diabetic ketoacidosis, CAP and sepsis. He has no prior history of ventilator use before these incidents. No episodes of dysphagia, emesis, or choking during his recent illness. He has no history of lung disease, asthma, COPD, or emphysema, and he is a non-smoker.  He is concerned about his ability to work due to the  fast-paced nature of his job and experiences exertional dyspnea, describing it as 'breathing sort of kind of heavy.' No chest tightness or chest pain but he feels fatigued from walking. He works in a [employer] .  Since discharge patient is feeling better but remains weak with low energy.  Has returned back to work.  Chest x-ray prior to discharge showed patchy bilateral multifocal airspace disease.  CT chest March 31, 2023 showed airspace opacities bilaterally and volume loss in the right lower lobe.  Patient denies any hemoptysis, chest pain, orthopnea, edema.  No Known Allergies   There is no immunization history on file for this patient.  Past Medical History:  Diagnosis Date   Diabetes mellitus, type II (HCC) 2015   Diagnosed during 2015 admission to [facility]   Hemangioma of spine 2015   S/P excision at [facility] with pathology favoring benign Hemangioma    Tobacco History: Social History   Tobacco Use  Smoking Status Never  Smokeless Tobacco Never   Counseling given: Not Answered   Outpatient Medications Prior to Visit  Medication Sig Dispense Refill   blood glucose meter kit and supplies KIT Dispense based on patient and insurance preference. Use up to four times daily as directed. (FOR ICD-9 250.00, 250.01). For QAC - HS accuchecks. 1 each 1   Blood Glucose Monitoring Suppl (BLOOD GLUCOSE MONITOR SYSTEM) w/Device KIT Use to test blood sugar in the morning, at noon, and at bedtime. 1 kit 0   carvedilol (COREG) 12.5 MG tablet Take 1 tablet (12.5 mg total) by mouth 2 (two) times daily with a meal. 60 tablet 0   ferrous sulfate 325 (65 FE) MG tablet Take 1 tablet (325 mg total) by mouth daily with breakfast. 30 tablet 0   furosemide (LASIX) 80 MG tablet Take 1 tablet (80 mg  total) by mouth daily. 30 tablet 0   insulin glargine (LANTUS SOLOSTAR) 100 UNIT/ML Solostar Pen Inject 35 Units into the skin daily. 3 mL 0   insulin lispro (HUMALOG) 100 UNIT/ML KwikPen Before  each meal, inject subcutaneously 3 times a day:  140-199 - 2 units, 200-250 - 6 units, 251-299 - 8 units,  300-349 - 10 units,  350 or above 14 units. 15 mL 0   Insulin Pen Needle 32G X 4 MM MISC For 4 times a day insulin Subcutaneously. 100 each 0   ipratropium-albuterol (DUONEB) 0.5-2.5 (3) MG/3ML SOLN Use twice a day scheduled and every 4 hours as needed for shortness of breath and wheezing. 360 mL 0   No facility-administered medications prior to visit.     Review of Systems:   Constitutional:   No  weight loss, night sweats,  Fevers, chills, + fatigue, or  lassitude.  HEENT:   No headaches,  Difficulty swallowing,  Tooth/dental problems, or  Sore throat,                No sneezing, itching, ear ache, nasal congestion, post nasal drip,   CV:  No chest pain,  Orthopnea, PND, swelling in lower extremities, anasarca, dizziness, palpitations, syncope.   GI  No heartburn, indigestion, abdominal pain, nausea, vomiting, diarrhea, change in bowel habits, loss of appetite, bloody stools.   Resp:  No chest wall deformity  Skin: no rash or lesions.  GU: no dysuria, change in color of urine, no urgency or frequency.  No flank pain, no hematuria   MS:  No joint pain or swelling.  No decreased range of motion.  No back pain.    Physical Exam  BP (!) 160/100 (BP Location: Right Arm, Patient Position: Sitting, Cuff Size: Normal)   Pulse (!) 112   Temp 98.1 F (36.7 C) (Oral)   Ht 5\' 8"  (1.727 m)   Wt 184 lb 3.2 oz (83.6 kg)   SpO2 100%   BMI 28.01 kg/m   GEN: A/Ox3; pleasant , NAD, well nourished    HEENT:  [place]/AT,  NOSE-clear, THROAT-clear, no lesions, no postnasal drip or exudate noted.   NECK:  Supple w/ fair ROM; no JVD; normal carotid impulses w/o bruits; no thyromegaly or nodules palpated; no lymphadenopathy.    RESP  Clear  P & A; w/o, wheezes/ rales/ or rhonchi. no accessory muscle use, no dullness to percussion  CARD:  RRR, no m/r/g, no peripheral edema, pulses intact,  no cyanosis or clubbing.  GI:   Soft & nt; nml bowel sounds; no organomegaly or masses detected.   Musco: Warm bil, no deformities or joint swelling noted.   Neuro: alert, no focal deficits noted.    Skin: Warm, no lesions or rashes    Lab Results:  CBC    ProBNP No results found for: "PROBNP"  Imaging: DG Chest 2 View Result Date: 07/11/2023 CLINICAL DATA:  Pneumonia, flu, and ARDS. EXAM: CHEST - 2 VIEW COMPARISON:  Chest radiographs 06/19/2023, 06/17/2023 (multiple studies), 06/28/2022, 03/26/2023, 03/22/2023 FINDINGS: Interval extubation and interval removal of nasogastric tube. Cardiac silhouette and mediastinal contours are within limits. The lungs are clear. This actually is an improvement from all the above-listed comparison radiographs when pulmonary opacities were visualized. Mild elevation the right hemidiaphragm is unchanged. No pleural effusion or pneumothorax. No acute skeletal abnormality. Mild multilevel degenerative disc changes of the thoracic spine. IMPRESSION: 1. Interval extubation and interval removal of nasogastric tube. 2. Resolution of the prior bilateral airspace  opacities. No acute cardiopulmonary process. Electronically Signed   By: Neita Garnet M.D.   On: 07/11/2023 14:12   DG CHEST PORT 1 VIEW Result Date: 06/19/2023 CLINICAL DATA:  Hypercapnic respiratory failure. EXAM: PORTABLE CHEST 1 VIEW COMPARISON:  06/17/2023 FINDINGS: Endotracheal tube has tip 2.9 cm above the carina. Enteric tube courses into the stomach and has tip and side-port over the stomach in the left upper quadrant. Small caliber catheter projects with tip over the left axilla of uncertain clinical significance. This may represent a PICC line. Mild stable elevation of the right hemidiaphragm. Lungs are somewhat hypoinflated demonstrate continued patchy bilateral multifocal airspace process likely multifocal infection. Possible small amount right pleural fluid. Cardiomediastinal silhouette and  remainder of the exam is unchanged. IMPRESSION: 1. Continued patchy bilateral multifocal airspace process likely multifocal infection. Possible small amount right pleural fluid. 2. Support apparatus as described. Small caliber catheter with tip projecting over the left axilla of uncertain clinical significance. This may represent a left-sided PICC line. Recommend clinical correlation. Electronically Signed   By: Elberta Fortis M.D.   On: 06/19/2023 13:48   Portable Chest x-ray Result Date: 06/17/2023 CLINICAL DATA:  Endotracheal tube present [id number] EXAM: PORTABLE CHEST 1 VIEW COMPARISON:  06/17/2023 FINDINGS: Endotracheal tube tip is at the carina and appears to be pointing towards the right mainstem bronchus. Bilateral airspace densities, left side greater than right. Heart size is within normal limits. Nasogastric tube extends into the abdomen. Negative for a pneumothorax. IMPRESSION: 1. Endotracheal tube tip is at the carina and appears to be pointing towards the right mainstem bronchus. Recommend retraction. This result was discussed with the patient's ICU nurse, Harrold Donath, at 11:34 a.m. on 06/17/2023. 2. Bilateral airspace densities. Findings are concerning for multifocal pneumonia. Electronically Signed   By: Richarda Overlie M.D.   On: 06/17/2023 11:35   DG Abd Portable 1V Result Date: 06/17/2023 CLINICAL DATA:  Encounter for imaging study to confirm nasogastric (NG) tube placement [id number] EXAM: PORTABLE ABDOMEN - 1 VIEW COMPARISON:  03/24/2023 and chest radiograph 06/17/2023 FINDINGS: Nasogastric tube tip is in left upper abdomen likely in the gastric fundus region. Gas-filled loops of bowel in the upper abdomen. Bilateral airspace densities in the lungs, left side greater than right. Tip of the endotracheal tube is at the carina and may be pointing towards the right mainstem bronchus. IMPRESSION: 1. Nasogastric tube tip in the gastric fundus region. 2. Tip of the endotracheal tube is at the carina and may be  pointing towards the right mainstem bronchus. Recommend retraction. Electronically Signed   By: Richarda Overlie M.D.   On: 06/17/2023 11:32   DG Chest Port 1 View Result Date: 06/17/2023 CLINICAL DATA:  Shortness of breath EXAM: PORTABLE CHEST 1 VIEW COMPARISON:  03/29/2023 FINDINGS: Low volume chest with bilateral airspace disease similar to prior. There is asymmetric elevation of the right diaphragm. Normal heart size and mediastinal contours. No visible effusion or pneumothorax. Artifact from EKG leads. IMPRESSION: Low volume chest with bilateral airspace disease similar to 03/31/2023. Electronically Signed   By: Tiburcio Pea M.D.   On: 06/17/2023 09:20    Administration History     None           No data to display          No results found for: "NITRICOXIDE"      Assessment & Plan:   Influenza A Recent hospitalization with severe influenza A complicated by pneumonia, ARDS sepsis-clinically is improving.  Needs follow-up chest x-ray.  Patient is encouraged on influenza vaccine going forward.  Plan  Patient Instructions  Chest xray today.  Call your primary provider to make follow up in next week.  Activity as tolerated.  Follow up with Dr. Celine Mans in 4-6 weeks with PFT and As needed         CAP (community acquired pneumonia) Patient had 2 hospitalizations with pneumonia in the last 4 months with severe ARDS and respiratory failure.  Check chest x-ray today for clearance.  Check PFTs on return visit.  Plan  Patient Instructions  Chest xray today.  Call your primary provider to make follow up in next week.  Activity as tolerated.  Follow up with Dr. Celine Mans in 4-6 weeks with PFT and As needed         ARDS (adult respiratory distress syndrome) (HCC) Hospitalizations x 2 in follow-up 2024 and January 2025 requiring vent support.  Check chest x-ray today.  Check PFTs on return visit Pending these results may need to follow-up with CT chest  Plan  Patient  Instructions  Chest xray today.  Call your primary provider to make follow up in next week.  Activity as tolerated.  Follow up with Dr. Celine Mans in 4-6 weeks with PFT and As needed         Acute hypoxemic respiratory failure (HCC) Resolved O2 saturations remain good today.  O2 sats are 100% on room air.  Chest x-ray is pending.  PFTs on return visit    I spent  43 minutes dedicated to the care of this patient on the date of this encounter to include pre-visit review of records, face-to-face time with the patient discussing conditions above, post visit ordering of testing, clinical documentation with the electronic health record, making appropriate referrals as documented, and communicating necessary findings to members of the patients care team.   Rubye Oaks, NP 07/11/2023

## 2023-07-11 NOTE — Assessment & Plan Note (Signed)
Patient had 2 hospitalizations with pneumonia in the last 4 months with severe ARDS and respiratory failure.  Check chest x-ray today for clearance.  Check PFTs on return visit.  Plan  Patient Instructions  Chest xray today.  Call your primary provider to make follow up in next week.  Activity as tolerated.  Follow up with Dr. Celine Mans in 4-6 weeks with PFT and As needed

## 2023-07-11 NOTE — Assessment & Plan Note (Signed)
Hospitalizations x 2 in follow-up 2024 and January 2025 requiring vent support.  Check chest x-ray today.  Check PFTs on return visit Pending these results may need to follow-up with CT chest  Plan  Patient Instructions  Chest xray today.  Call your primary provider to make follow up in next week.  Activity as tolerated.  Follow up with Dr. Celine Mans in 4-6 weeks with PFT and As needed

## 2023-07-11 NOTE — Assessment & Plan Note (Signed)
Recent hospitalization with severe influenza A complicated by pneumonia, ARDS sepsis-clinically is improving.  Needs follow-up chest x-ray.  Patient is encouraged on influenza vaccine going forward.  Plan  Patient Instructions  Chest xray today.  Call your primary provider to make follow up in next week.  Activity as tolerated.  Follow up with Dr. Celine Mans in 4-6 weeks with PFT and As needed

## 2023-07-18 ENCOUNTER — Ambulatory Visit: Payer: Self-pay | Admitting: General Practice

## 2023-07-18 NOTE — Telephone Encounter (Signed)
 Copied from CRM [id number]. Topic: Clinical - Red Word Triage >> Jul 18, 2023 11:19 AM Irine Seal wrote: [facility] that prompted transfer to Nurse Triage: last seen in ED for Acute respiratory failure with hypoxemia, patient presents with shortness of breath, and persistent non productive cough, no PCP, wanting an acute visit.   Chief Complaint: Cough Symptoms: Cough, shortness of breath  Frequency: Intermittent  Disposition: [] ED /[] Urgent Care (no appt availability in office) / [x] Appointment(In office/virtual)/ []  [facility]/ [] Home Care/ [] Refused Recommended Disposition /[] [facility]/ []  Follow-up with PCP Additional Notes: Patient's mother called stating that the patient has been in the hospital twice since October for respiratory failure. She states that he was instructed by the hospital as well as a pulmonologist to follow up with his PCP, which he does not have. She states that he still has intermittent cough and shortness of breath, but is at work now and is unsure if he is currently symptomatic.   New patient appointment made for the patient with preferred clinic. Patient's mother instructed to have patient call for new or worsening symptoms, or to have him follow up with urgent care or the ED if needed. She verbalized understanding and agreement with this plan.    Reason for Disposition  [1] MILD longstanding difficulty breathing AND [2]  SAME as normal  Answer Assessment - Initial Assessment Questions 1. RESPIRATORY STATUS: "Describe your breathing?" (e.g., wheezing, shortness of breath, unable to speak, severe coughing)      Shortness of breath with wheezing  2. ONSET: "When did this breathing problem begin?"      October 3. PATTERN "Does the difficult breathing come and go, or has it been constant since it started?"      Intermittent  4. SEVERITY: "How bad is your breathing?" (e.g., mild, moderate, severe)    - MILD: No SOB at rest, mild SOB with walking,  speaks normally in sentences, can lie down, no retractions, pulse < 100.    - MODERATE: SOB at rest, SOB with minimal exertion and prefers to sit, cannot lie down flat, speaks in phrases, mild retractions, audible wheezing, pulse 100-120.    - SEVERE: Very SOB at rest, speaks in single words, struggling to breathe, sitting hunched forward, retractions, pulse > 120      Unsure, patient at work 5. RECURRENT SYMPTOM: "Have you had difficulty breathing before?" If Yes, ask: "When was the last time?" and "What happened that time?"      Yes 6. CARDIAC HISTORY: "Do you have any history of heart disease?" (e.g., heart attack, angina, bypass surgery, angioplasty)      No 7. LUNG HISTORY: "Do you have any history of lung disease?"  (e.g., pulmonary embolus, asthma, emphysema)     Respiratory failure  8. CAUSE: "What do you think is causing the breathing problem?"      Unsure  9. OTHER SYMPTOMS: "Do you have any other symptoms? (e.g., dizziness, runny nose, cough, chest pain, fever)     Cough  10. O2 SATURATION MONITOR:  "Do you use an oxygen saturation monitor (pulse oximeter) at home?" If Yes, ask: "What is your reading (oxygen level) today?" "What is your usual oxygen saturation reading?" (e.g., 95%)       No  Protocols used: Breathing Difficulty-A-AH

## 2023-07-29 ENCOUNTER — Inpatient Hospital Stay (HOSPITAL_COMMUNITY)
Admission: EM | Admit: 2023-07-29 | Discharge: 2023-08-02 | DRG: 280 | Disposition: A | Discharge status: Home / Self Care | Payer: MEDICAID | Attending: Internal Medicine | Admitting: Internal Medicine

## 2023-07-29 ENCOUNTER — Encounter (HOSPITAL_COMMUNITY): Payer: Self-pay

## 2023-07-29 ENCOUNTER — Other Ambulatory Visit: Payer: Self-pay

## 2023-07-29 ENCOUNTER — Emergency Department (HOSPITAL_COMMUNITY): Payer: MEDICAID

## 2023-07-29 DIAGNOSIS — J9600 Acute respiratory failure, unspecified whether with hypoxia or hypercapnia: Secondary | ICD-10-CM | POA: Diagnosis present

## 2023-07-29 DIAGNOSIS — J189 Pneumonia, unspecified organism: Principal | ICD-10-CM | POA: Diagnosis present

## 2023-07-29 DIAGNOSIS — J4521 Mild intermittent asthma with (acute) exacerbation: Secondary | ICD-10-CM

## 2023-07-29 DIAGNOSIS — D631 Anemia in chronic kidney disease: Secondary | ICD-10-CM | POA: Diagnosis present

## 2023-07-29 DIAGNOSIS — E1165 Type 2 diabetes mellitus with hyperglycemia: Secondary | ICD-10-CM | POA: Diagnosis present

## 2023-07-29 DIAGNOSIS — Z79899 Other long term (current) drug therapy: Secondary | ICD-10-CM

## 2023-07-29 DIAGNOSIS — R0902 Hypoxemia: Secondary | ICD-10-CM

## 2023-07-29 DIAGNOSIS — A481 Legionnaires' disease: Secondary | ICD-10-CM | POA: Diagnosis present

## 2023-07-29 DIAGNOSIS — E1169 Type 2 diabetes mellitus with other specified complication: Secondary | ICD-10-CM

## 2023-07-29 DIAGNOSIS — E1122 Type 2 diabetes mellitus with diabetic chronic kidney disease: Secondary | ICD-10-CM | POA: Diagnosis present

## 2023-07-29 DIAGNOSIS — J188 Other pneumonia, unspecified organism: Secondary | ICD-10-CM | POA: Diagnosis present

## 2023-07-29 DIAGNOSIS — Z91018 Allergy to other foods: Secondary | ICD-10-CM

## 2023-07-29 DIAGNOSIS — Z7951 Long term (current) use of inhaled steroids: Secondary | ICD-10-CM

## 2023-07-29 DIAGNOSIS — J9601 Acute respiratory failure with hypoxia: Secondary | ICD-10-CM | POA: Diagnosis present

## 2023-07-29 DIAGNOSIS — Z803 Family history of malignant neoplasm of breast: Secondary | ICD-10-CM

## 2023-07-29 DIAGNOSIS — I21A1 Myocardial infarction type 2: Secondary | ICD-10-CM | POA: Diagnosis present

## 2023-07-29 DIAGNOSIS — Z794 Long term (current) use of insulin: Secondary | ICD-10-CM

## 2023-07-29 DIAGNOSIS — D509 Iron deficiency anemia, unspecified: Secondary | ICD-10-CM | POA: Diagnosis present

## 2023-07-29 DIAGNOSIS — I5032 Chronic diastolic (congestive) heart failure: Secondary | ICD-10-CM | POA: Insufficient documentation

## 2023-07-29 DIAGNOSIS — N1832 Chronic kidney disease, stage 3b: Secondary | ICD-10-CM | POA: Diagnosis present

## 2023-07-29 DIAGNOSIS — Z1152 Encounter for screening for COVID-19: Secondary | ICD-10-CM

## 2023-07-29 DIAGNOSIS — E669 Obesity, unspecified: Secondary | ICD-10-CM

## 2023-07-29 DIAGNOSIS — I5033 Acute on chronic diastolic (congestive) heart failure: Secondary | ICD-10-CM | POA: Diagnosis present

## 2023-07-29 DIAGNOSIS — E119 Type 2 diabetes mellitus without complications: Secondary | ICD-10-CM

## 2023-07-29 DIAGNOSIS — E538 Deficiency of other specified B group vitamins: Secondary | ICD-10-CM | POA: Diagnosis present

## 2023-07-29 DIAGNOSIS — E876 Hypokalemia: Secondary | ICD-10-CM | POA: Diagnosis present

## 2023-07-29 DIAGNOSIS — N179 Acute kidney failure, unspecified: Secondary | ICD-10-CM | POA: Diagnosis present

## 2023-07-29 DIAGNOSIS — J45901 Unspecified asthma with (acute) exacerbation: Secondary | ICD-10-CM | POA: Diagnosis present

## 2023-07-29 DIAGNOSIS — I130 Hypertensive heart and chronic kidney disease with heart failure and stage 1 through stage 4 chronic kidney disease, or unspecified chronic kidney disease: Principal | ICD-10-CM | POA: Diagnosis present

## 2023-07-29 DIAGNOSIS — N182 Chronic kidney disease, stage 2 (mild): Secondary | ICD-10-CM

## 2023-07-29 HISTORY — DX: Pneumonia, unspecified organism: J18.9

## 2023-07-29 LAB — RESPIRATORY PANEL BY PCR

## 2023-07-29 LAB — RESP PANEL BY RT-PCR (RSV, FLU A&B, COVID)  RVPGX2
Influenza A by PCR: NEGATIVE
Influenza B by PCR: NEGATIVE
Resp Syncytial Virus by PCR: NEGATIVE
SARS Coronavirus 2 by RT PCR: NEGATIVE

## 2023-07-29 LAB — CBC WITH DIFFERENTIAL/PLATELET
Abs Immature Granulocytes: 0.04 10*3/uL (ref 0.00–0.07)
Basophils Absolute: 0 10*3/uL (ref 0.0–0.1)
Basophils Relative: 0 %
Eosinophils Absolute: 0.1 10*3/uL (ref 0.0–0.5)
Eosinophils Relative: 1 %
HCT: 35 % — ABNORMAL LOW (ref 39.0–52.0)
Hemoglobin: 11 g/dL — ABNORMAL LOW (ref 13.0–17.0)
Immature Granulocytes: 0 %
Lymphocytes Relative: 8 %
Lymphs Abs: 0.8 10*3/uL (ref 0.7–4.0)
MCH: 22.5 pg — ABNORMAL LOW (ref 26.0–34.0)
MCHC: 31.4 g/dL (ref 30.0–36.0)
MCV: 71.6 fL — ABNORMAL LOW (ref 80.0–100.0)
Monocytes Absolute: 0.4 10*3/uL (ref 0.1–1.0)
Monocytes Relative: 4 %
Neutro Abs: 8.7 10*3/uL — ABNORMAL HIGH (ref 1.7–7.7)
Neutrophils Relative %: 87 %
Platelets: 227 10*3/uL (ref 150–400)
RBC: 4.89 MIL/uL (ref 4.22–5.81)
RDW: 17.5 % — ABNORMAL HIGH (ref 11.5–15.5)
WBC: 10 10*3/uL (ref 4.0–10.5)
nRBC: 0 % (ref 0.0–0.2)

## 2023-07-29 LAB — GLUCOSE, CAPILLARY: Glucose-Capillary: 401 mg/dL — ABNORMAL HIGH (ref 70–99)

## 2023-07-29 LAB — BASIC METABOLIC PANEL
Anion gap: 13 (ref 5–15)
BUN: 10 mg/dL (ref 6–20)
CO2: 19 mmol/L — ABNORMAL LOW (ref 22–32)
Calcium: 8.9 mg/dL (ref 8.9–10.3)
Chloride: 105 mmol/L (ref 98–111)
Creatinine, Ser: 1.19 mg/dL (ref 0.61–1.24)
GFR, Estimated: >60 mL/min (ref >60)
Glucose, Bld: 193 mg/dL — ABNORMAL HIGH (ref 70–99)
Potassium: 4.3 mmol/L (ref 3.5–5.1)
Sodium: 137 mmol/L (ref 135–145)

## 2023-07-29 LAB — CBG MONITORING, ED: Glucose-Capillary: 325 mg/dL — ABNORMAL HIGH (ref 70–99)

## 2023-07-29 LAB — SEDIMENTATION RATE: Sed Rate: 9 mm/h (ref 0–16)

## 2023-07-29 LAB — TROPONIN I (HIGH SENSITIVITY)
Troponin I (High Sensitivity): 17 ng/L (ref <18)
Troponin I (High Sensitivity): 20 ng/L — ABNORMAL HIGH (ref <18)
Troponin I (High Sensitivity): 16 ng/L (ref <18)

## 2023-07-29 LAB — RAPID URINE DRUG SCREEN, HOSP PERFORMED
Amphetamines: NOT DETECTED
Barbiturates: NOT DETECTED
Benzodiazepines: NOT DETECTED
Cocaine: NOT DETECTED
Opiates: NOT DETECTED
Tetrahydrocannabinol: NOT DETECTED

## 2023-07-29 LAB — HEMOGLOBIN A1C
Hgb A1c MFr Bld: 6.8 % — ABNORMAL HIGH (ref 4.8–5.6)
Mean Plasma Glucose: 148.46 mg/dL

## 2023-07-29 LAB — STREP PNEUMONIAE URINARY ANTIGEN: Strep Pneumo Urinary Antigen: NEGATIVE

## 2023-07-29 LAB — PROCALCITONIN: Procalcitonin: <0.1 ng/mL

## 2023-07-29 LAB — I-STAT CG4 LACTIC ACID, ED
Lactic Acid, Venous: 1.5 mmol/L (ref 0.5–1.9)
Lactic Acid, Venous: 1.1 mmol/L (ref 0.5–1.9)

## 2023-07-29 LAB — BRAIN NATRIURETIC PEPTIDE: B Natriuretic Peptide: 297.9 pg/mL — ABNORMAL HIGH (ref 0.0–100.0)

## 2023-07-29 MED ORDER — INSULIN ASPART 100 UNIT/ML IJ SOLN
0.0000 [IU] | Freq: Three times a day (TID) | INTRAMUSCULAR | Status: DC
  Administered 2023-07-29: 11 [IU] via SUBCUTANEOUS

## 2023-07-29 MED ORDER — SODIUM CHLORIDE 0.9 % IV SOLN
500.0000 mg | INTRAVENOUS | Status: DC
  Administered 2023-07-30 – 2023-07-31 (×2): 500 mg via INTRAVENOUS
  Filled 2023-07-29 (×2): qty 5

## 2023-07-29 MED ORDER — SODIUM CHLORIDE 0.9 % IV SOLN
500.0000 mg | Freq: Once | INTRAVENOUS | Status: AC
  Administered 2023-07-29: 500 mg via INTRAVENOUS
  Filled 2023-07-29: qty 5

## 2023-07-29 MED ORDER — ACETAMINOPHEN 325 MG PO TABS
650.0000 mg | ORAL_TABLET | Freq: Four times a day (QID) | ORAL | Status: DC | PRN
  Administered 2023-07-31: 650 mg via ORAL
  Filled 2023-07-29: qty 2

## 2023-07-29 MED ORDER — FUROSEMIDE 10 MG/ML IJ SOLN
40.0000 mg | Freq: Every day | INTRAMUSCULAR | Status: DC
Start: 2023-07-29 — End: 2023-07-31
  Administered 2023-07-29 – 2023-07-31 (×3): 40 mg via INTRAVENOUS
  Filled 2023-07-29 (×3): qty 4

## 2023-07-29 MED ORDER — ALBUTEROL SULFATE (2.5 MG/3ML) 0.083% IN NEBU
5.0000 mg | INHALATION_SOLUTION | Freq: Once | RESPIRATORY_TRACT | Status: AC
  Administered 2023-07-29: 5 mg via RESPIRATORY_TRACT
  Filled 2023-07-29: qty 6

## 2023-07-29 MED ORDER — IOHEXOL 350 MG/ML SOLN
75.0000 mL | Freq: Once | INTRAVENOUS | Status: AC | PRN
  Administered 2023-07-29: 75 mL via INTRAVENOUS

## 2023-07-29 MED ORDER — SODIUM CHLORIDE 0.9 % IV SOLN
2.0000 g | INTRAVENOUS | Status: DC
  Administered 2023-07-30 – 2023-07-31 (×2): 2 g via INTRAVENOUS
  Filled 2023-07-29 (×2): qty 20

## 2023-07-29 MED ORDER — ACETAMINOPHEN 650 MG RE SUPP: 650.0000 mg | Freq: Four times a day (QID) | RECTAL | Status: DC | PRN

## 2023-07-29 MED ORDER — METHYLPREDNISOLONE SODIUM SUCC 125 MG IJ SOLR
125.0000 mg | Freq: Once | INTRAMUSCULAR | Status: AC
  Administered 2023-07-29: 125 mg via INTRAVENOUS
  Filled 2023-07-29: qty 2

## 2023-07-29 MED ORDER — INSULIN GLARGINE 100 UNIT/ML ~~LOC~~ SOLN
35.0000 [IU] | Freq: Every day | SUBCUTANEOUS | Status: DC
  Filled 2023-07-29: qty 0.35

## 2023-07-29 MED ORDER — INSULIN GLARGINE 100 UNIT/ML ~~LOC~~ SOLN
10.0000 [IU] | Freq: Every day | SUBCUTANEOUS | Status: DC
  Administered 2023-07-29: 10 [IU] via SUBCUTANEOUS
  Filled 2023-07-29 (×2): qty 0.1

## 2023-07-29 MED ORDER — ENOXAPARIN SODIUM 40 MG/0.4ML IJ SOSY
40.0000 mg | PREFILLED_SYRINGE | INTRAMUSCULAR | Status: DC
Start: 2023-07-29 — End: 2023-08-02
  Administered 2023-07-29 – 2023-08-01 (×4): 40 mg via SUBCUTANEOUS
  Filled 2023-07-29 (×4): qty 0.4

## 2023-07-29 MED ORDER — FUROSEMIDE 20 MG PO TABS: 80.0000 mg | ORAL_TABLET | Freq: Every day | ORAL | Status: DC

## 2023-07-29 MED ORDER — INSULIN ASPART 100 UNIT/ML IJ SOLN
0.0000 [IU] | Freq: Three times a day (TID) | INTRAMUSCULAR | Status: DC
  Administered 2023-07-29: 15 [IU] via SUBCUTANEOUS
  Administered 2023-07-30: 5 [IU] via SUBCUTANEOUS

## 2023-07-29 MED ORDER — SODIUM CHLORIDE 0.9 % IV SOLN
1.0000 g | Freq: Once | INTRAVENOUS | Status: AC
  Administered 2023-07-29: 1 g via INTRAVENOUS
  Filled 2023-07-29: qty 10

## 2023-07-29 MED ORDER — MOMETASONE FURO-FORMOTEROL FUM 200-5 MCG/ACT IN AERO
2.0000 | INHALATION_SPRAY | Freq: Two times a day (BID) | RESPIRATORY_TRACT | Status: DC
Start: 2023-07-29 — End: 2023-08-02
  Administered 2023-07-29 – 2023-08-02 (×8): 2 via RESPIRATORY_TRACT
  Filled 2023-07-29: qty 8.8

## 2023-07-29 MED ORDER — CARVEDILOL 12.5 MG PO TABS
12.5000 mg | ORAL_TABLET | Freq: Two times a day (BID) | ORAL | Status: DC
  Administered 2023-07-29 – 2023-08-01 (×6): 12.5 mg via ORAL
  Filled 2023-07-29 (×6): qty 1

## 2023-07-29 NOTE — Consult Note (Signed)
 NAME:  Gary Conway, MRN:  [id number], DOB:  [birth year], LOS: 0 ADMISSION DATE:  07/29/2023, CONSULTATION DATE:  3/3 REFERRING MD:  Stevan Born, CHIEF COMPLAINT:  recurrent respiratory failure    History of Present Illness:  48 year old male (non-smoker) w/ complicated medical hx including DM, benign spinal cord injury (s/p resection 2015), hospitalized X 2 for ARDS requiring mechanical ventilation. First time Oct 2024 w/ noted alveolitis/ DAH treated w/ steroids in addition to abx and diuresis. Just discharged 1/27 after being admitted w/ resp failure w/ ARDS requiring ventilation.  Seen in our office 2/13 still having exertional dyspnea, his CXR had cleared as of that time.  He still however noted exertional dyspnea walking into work.  He estimates he has about a mile walk from the parking lot to get to his place of work.  He works at [employer] on [address].  He does report a fair amount of dust in his workplace, but no specific packing material exposure he also reports he wears his mask regularly.  He has had a chronic cough since his discharge in January.  That cough is more active at night, exacerbated by the supine position and relieved by sit sleeping upright.  It has remained nonproductive, and has not changed in characteristic.  He has had no new fever no new chills no sick exposure no chest pain he reports a little bit of lightheadedness after exertion.  He has had no nausea no vomiting he denies reflux or heartburn.  He said no abdominal discomfort no bloating.  He does not smoke, denies vaping, denies illicit drug use.  Lives in an apartment, has no concern about his air conditioning system, has carpet but they are new and just replaced, no birds no pets no kids. On Presents to the ER 3/3 reporting that on 2/27 noted new onset of some orthopnea when he tried to lay down.  He has had no increase in lower extremity swelling however just some chronic right greater than left  trace edema but not increased.  On 3/1 developed increased worsening shortness of breath even at rest while at work and room  air pulse oximetry noted at 88% by EMS  ER eval  BNP 298, RVP neg for COVID, influenza, RSV, wbc nml, trop I neg, lactate 1.5,  Chest x-ray with bilateral infiltrates left greater than right, CT of chest showing diffuse bilateral airspace disease without significant atelectasis and only small pleural effusions.  He was admitted by the internal medicine service, started on broad-spectrum antibiotics, given a dose of Lasix, and because of the recurrence of the symptoms pulmonary was asked to evaluate Pertinent  Medical History  DAH w/ ARDS Oct 2024 requiring intubation (felt possibly hypervolemia vs inflammatory, was treated w/ steroids) recent in-patient stay 1/20 thru 1/27 for influenza A and ARDS (required intubation) benign spinal cord tumor, DM, HTN, HFpEF, CKD stage IIIa DKA Last echo 2024: EF 50-55%  Significant Hospital Events: Including procedures, antibiotic start and stop dates in addition to other pertinent events   3/3 admitted with recurrent diffuse bilateral pulmonary infiltrates  Interim History / Subjective:  Short of breath at rest  Objective   Blood pressure (!) 143/111, pulse (!) 107, temperature 98.8 F (37.1 C), temperature source Oral, resp. rate (!) 25, height 5\' 8"  (1.727 m), weight 81.6 kg, SpO2 96%.       No intake or output data in the 24 hours ending 07/29/23 1638 Filed Weights   07/29/23  9147  Weight: 81.6 kg    Examination: General: Pleasant 48 year old male patient sitting up in bed no acute distress able to speak in full sentences HENT: Normocephalic atraumatic mucous membranes are moist no obvious jugular venous distention Lungs: Crackles both bases, currently room air saturation is 99% on room air at rest, PCXR L>R airspace disease. CT chest: diffuse bilateral patchy airspace disease  Cardiovascular: Tachycardic rhythm sinus no  murmur rub or gallop Abdomen: Soft nontender no organomegaly Extremities: Trace lower extremity edema chronic on the right pulses strong Neuro: Awake oriented no focal deficits  Resolved Hospital Problem list    Assessment & Plan:  Recurrent dyspnea secondary to diffuse pulmonary infiltrates of unclear etiology. -Does not appear to be a bacterial process, would expect his clinical exam to look much more toxic with his current CT findings Differential includes: Recurrent aspiration, he does have a history of diabetes with a nighttime cough so esophageal dysmotility certainly a consideration,  viral pneumonia, or pneumonitis either postviral, autoimmune or postexposure although nothing obvious in the history.  Also consider pulmonary edema Plan Agree with hospital admission Agree with empiric antibiotics Send complete respiratory viral panel Send procalcitonin Esophagram to look for esophageal dysmotility Repeat echocardiogram rule out cardiomyopathy Send sed rate, UDS, urine strep Legionella antigen, also will go ahead and send IgA IgG and IgM We will hold off on steroids at this point but agree with IV diuresis He is going to need further outpatient follow-up even after resolution of acute illness Best Practice (right click and "Reselect all SmartList Selections" daily)  Per primary   Labs   CBC: Recent Labs  Lab 07/29/23 0924  WBC 10.0  NEUTROABS 8.7*  HGB 11.0*  HCT 35.0*  MCV 71.6*  PLT 227    Basic Metabolic Panel: Recent Labs  Lab 07/29/23 0924  NA 137  K 4.3  CL 105  CO2 19*  GLUCOSE 193*  BUN 10  CREATININE 1.19  CALCIUM 8.9   GFR: Estimated Creatinine Clearance: 72.6 mL/min (by C-G formula based on SCr of 1.19 mg/dL). Recent Labs  Lab 07/29/23 0924 07/29/23 0943 07/29/23 1138  WBC 10.0  --   --   LATICACIDVEN  --  1.5 1.1    Liver Function Tests: No results for input(s): "AST", "ALT", "ALKPHOS", "BILITOT", "PROT", "ALBUMIN" in the last 168  hours. No results for input(s): "LIPASE", "AMYLASE" in the last 168 hours. No results for input(s): "AMMONIA" in the last 168 hours.  ABG    Component Value Date/Time   PHART 7.396 06/18/2023 1228   PCO2ART 36.3 06/18/2023 1228   PO2ART 84 06/18/2023 1228   HCO3 22.2 06/18/2023 1228   TCO2 23 06/18/2023 1228   ACIDBASEDEF 2.0 06/18/2023 1228   O2SAT 96 06/18/2023 1228     Coagulation Profile: No results for input(s): "INR", "PROTIME" in the last 168 hours.  Cardiac Enzymes: No results for input(s): "CKTOTAL", "CKMB", "CKMBINDEX", "TROPONINI" in the last 168 hours.  HbA1C: Hgb A1c MFr Bld  Date/Time Value Ref Range Status  03/22/2023 06:06 PM 8.1 (H) 4.8 - 5.6 % Final    Comment:    (NOTE) Pre diabetes:          5.7%-6.4%  Diabetes:              >6.4%  Glycemic control for   <7.0% adults with diabetes   09/13/2019 02:26 AM 12.9 (H) 4.8 - 5.6 % Final    Comment:    (NOTE) Pre diabetes:  5.7%-6.4% Diabetes:              >6.4% Glycemic control for   <7.0% adults with diabetes     CBG: No results for input(s): "GLUCAP" in the last 168 hours.  Review of Systems:   Review of Systems  Constitutional:  Negative for chills, fever and malaise/fatigue.  HENT:  Negative for congestion and sore throat.   Eyes: Negative.   Respiratory:  Positive for cough, shortness of breath and wheezing. Negative for sputum production.   Cardiovascular:  Positive for orthopnea. Negative for chest pain and leg swelling.  Gastrointestinal:  Negative for constipation, heartburn and nausea.  Genitourinary: Negative.   Musculoskeletal:  Positive for myalgias.  Skin: Negative.   Neurological: Negative.   Endo/Heme/Allergies: Negative.      Past Medical History:  He,  has a past medical history of Diabetes mellitus, type II (HCC) (2015) and Hemangioma of spine (2015).   Surgical History:   Past Surgical History:  Procedure Laterality Date   LAMINECTOMY N/A 03/22/2014    Procedure: THORACIC two - four  LAMINECTOMY FOR TUMOR;  Surgeon: Temple Pacini, MD;  Location: [facility];  Service: Neurosurgery;  Laterality: N/A;  Throacic two  - four laminectomy for tumor   LEG SURGERY       Social History:   reports that he has never smoked. He has never used smokeless tobacco. He reports current alcohol use. He reports that he does not use drugs.   Family History:  His family history includes Breast cancer in his mother.   Allergies Allergies  Allergen Reactions   Food Hives, Itching and Rash    Mayonnaise - itching, rash, hives   Mustard Hives, Itching and Rash     Home Medications  Prior to Admission medications   Medication Sig Start Date End Date Taking? Authorizing Provider  carvedilol (COREG) 12.5 MG tablet Take 1 tablet (12.5 mg total) by mouth 2 (two) times daily with a meal. 04/08/23  Yes Leroy Sea, MD  ibuprofen (ADVIL) 200 MG tablet Take 400-600 mg by mouth 2 (two) times daily as needed for moderate pain (pain score 4-6) or headache.   Yes [provider]  blood glucose meter kit and supplies KIT Dispense based on patient and insurance preference. Use up to four times daily as directed. (FOR ICD-9 250.00, 250.01). For QAC - HS accuchecks. 09/14/19   Leroy Sea, MD  Blood Glucose Monitoring Suppl (BLOOD GLUCOSE MONITOR SYSTEM) w/Device KIT Use to test blood sugar in the morning, at noon, and at bedtime. 04/08/23   Leroy Sea, MD  ferrous sulfate 325 (65 FE) MG tablet Take 1 tablet (325 mg total) by mouth daily with breakfast. Patient taking differently: Take 325 mg by mouth every evening. 04/08/23  Yes Leroy Sea, MD  furosemide (LASIX) 80 MG tablet Take 1 tablet (80 mg total) by mouth daily. Patient taking differently: Take 80 mg by mouth every evening. 04/08/23  Yes Leroy Sea, MD  insulin glargine (LANTUS SOLOSTAR) 100 UNIT/ML Solostar Pen Inject 35 Units into the skin daily. Patient taking differently:  Inject 2 Units into the skin every evening. 04/08/23  Yes Leroy Sea, MD  insulin lispro (HUMALOG) 100 UNIT/ML KwikPen Before each meal, inject subcutaneously 3 times a day:  140-199 - 2 units, 200-250 - 6 units, 251-299 - 8 units,  300-349 - 10 units,  350 or above 14 units. Patient taking differently: Inject 0-3 Units into the skin  3 (three) times daily with meals as needed (hyperglycemia). 04/08/23  Yes Leroy Sea, MD  Insulin Pen Needle 32G X 4 MM MISC For 4 times a day insulin Subcutaneously. 04/08/23   Leroy Sea, MD     Critical care time: NA

## 2023-07-29 NOTE — ED Triage Notes (Signed)
 Pt bib [facility] from work. Pt has been on and off dealing with walking pneumonia. Pt was recently intubated in October 2024 and January 2025 for pneumonia.  88 % RA upon ems arrival. Ronchi reported per ems on right side lungs.   200/130 BP  120 HR  CBG 117  HX Diabetes and pneumonia.  Pt is on lasix and carvedilol.    EMS provided duo neb in route.

## 2023-07-29 NOTE — H&P (Addendum)
 History and Physical    Patient: Gary Conway ZOX:[id number] DOB: [birth year] DOA: 07/29/2023 DOS: the patient was seen and examined on 07/29/2023 PCP: Patient, No Pcp Per  Patient coming from: Home  Chief Complaint:  Chief Complaint  Patient presents with   Shortness of Breath        HPI: Gary Conway is a 48 y.o. male with medical history significant of type 2 diabetes, benign spinal cord neoplasm (s/p resection 2015), ARDS requiring mechanical ventilation x 2 presenting to the ED with shortness of breath.  Of note, patient was admitted in October 2020 for with diffuse alveolar hemorrhage.  He developed ARDS that admission requiring intubation and mechanical ventilation.  Patient was again admitted in January 2025 with respiratory failure secondary to influenza.  He again developed ARDS requiring mechanical ventilation during that admission.  He was discharged on 1/27 and had pulmonology follow-up as an outpatient on 2/13 with clearance of his chest x-ray though he still complained of exertional dyspnea at that time.  Patient reports that he has been having ongoing exertional dyspnea particularly when walking to work since his discharge in January.  He states that his shortness of breath with exertion was not too significant and would typically resolve with rest.  However, earlier today he noted shortness of breath with exertion when he went to work but this episode did not abate with rest.  Given his prior history, he ultimately called EMS who noted that his oxygen saturation was 88% on room air and subsequently brought him to the ED for further evaluation. Patient does note a chronic cough since his discharge in January.  Notes that his cough is more predominant during nighttime when he is laying supine and improves when sitting upright.  His cough is nonproductive and has remained the same since January.  He denies any fevers, chills, nausea, vomiting, chest pain, palpitations, abdominal  pain, urinary changes.  ED course: Vital signs notable for tachycardia to the 120s, tachypnea to the 20s, and hypertension.  He was placed on 2 L [place] with improvement in O2 sats.  CBC without leukocytosis, does show mild microcytic anemia.  BMP with hyperglycemia, otherwise unremarkable.  Respiratory panel negative for COVID, flu, RSV.  BNP elevated to 297.  Lactate normal x 2.  Troponin initially normal at 17, repeat mildly elevated to 20.  Chest x-ray showing hazy diffuse parenchymal and interstitial opacities in the left hemithorax.  CT angio chest showing multifocal scattered groundglass lung opacities, severely elevated right hemidiaphragm, several enlarged mediastinal lymph nodes likely reactive.  Patient was started on Rocephin and azithromycin by the ED provider.  [facility] hospitalist asked to evaluate patient for admission.  Review of Systems: As mentioned in the history of present illness. All other systems reviewed and are negative. Past Medical History:  Diagnosis Date   Diabetes mellitus, type II (HCC) 2015   Diagnosed during 2015 admission to [facility]   Hemangioma of spine 2015   S/P excision at [facility] with pathology favoring benign Hemangioma   Past Surgical History:  Procedure Laterality Date   LAMINECTOMY N/A 03/22/2014   Procedure: THORACIC two - four  LAMINECTOMY FOR TUMOR;  Surgeon: Temple Pacini, MD;  Location: [facility]UR[facility] ORS;  Service: Neurosurgery;  Laterality: N/A;  Throacic two  - four laminectomy for tumor   LEG SURGERY     Social History:  reports that he has never smoked. He has never used smokeless tobacco. He reports current alcohol use. He reports that he does  not use drugs.  Allergies  Allergen Reactions   Food Hives, Itching and Rash    Mayonnaise - itching, rash, hives   Mustard Hives, Itching and Rash    Family History  Problem Relation Age of Onset   Breast cancer Mother     Prior to Admission medications   Medication Sig Start Date End Date Taking?  Authorizing Provider  blood glucose meter kit and supplies KIT Dispense based on patient and insurance preference. Use up to four times daily as directed. (FOR ICD-9 250.00, 250.01). For QAC - HS accuchecks. 09/14/19   Leroy Sea, MD  Blood Glucose Monitoring Suppl (BLOOD GLUCOSE MONITOR SYSTEM) w/Device KIT Use to test blood sugar in the morning, at noon, and at bedtime. 04/08/23   Leroy Sea, MD  carvedilol (COREG) 12.5 MG tablet Take 1 tablet (12.5 mg total) by mouth 2 (two) times daily with a meal. 04/08/23   Leroy Sea, MD  ferrous sulfate 325 (65 FE) MG tablet Take 1 tablet (325 mg total) by mouth daily with breakfast. 04/08/23   Leroy Sea, MD  furosemide (LASIX) 80 MG tablet Take 1 tablet (80 mg total) by mouth daily. 04/08/23   Leroy Sea, MD  insulin glargine (LANTUS SOLOSTAR) 100 UNIT/ML Solostar Pen Inject 35 Units into the skin daily. 04/08/23   Leroy Sea, MD  insulin lispro (HUMALOG) 100 UNIT/ML KwikPen Before each meal, inject subcutaneously 3 times a day:  140-199 - 2 units, 200-250 - 6 units, 251-299 - 8 units,  300-349 - 10 units,  350 or above 14 units. 04/08/23   Leroy Sea, MD  Insulin Pen Needle 32G X 4 MM MISC For 4 times a day insulin Subcutaneously. 04/08/23   Leroy Sea, MD  ipratropium-albuterol (DUONEB) 0.5-2.5 (3) MG/3ML SOLN Use twice a day scheduled and every 4 hours as needed for shortness of breath and wheezing. 04/08/23   Leroy Sea, MD    Physical Exam: Vitals:   07/29/23 1245 07/29/23 1315 07/29/23 1508 07/29/23 1515  BP: (!) 143/109 (!) 150/125 (!) 143/111   Pulse:   (!) 107   Resp: (!) 24 20 (!) 25   Temp:    98.8 F (37.1 C)  TempSrc:    Oral  SpO2:   96%   Weight:      Height:       Physical Exam Constitutional:      Appearance: He is well-developed. He is not ill-appearing.  HENT:     Head: Normocephalic and atraumatic.     Mouth/Throat:     Mouth: Mucous membranes are moist.      Pharynx: Oropharynx is clear. No oropharyngeal exudate.  Eyes:     General: No scleral icterus.    Extraocular Movements: Extraocular movements intact.     Conjunctiva/sclera: Conjunctivae normal.     Pupils: Pupils are equal, round, and reactive to light.  Neck:     Vascular: No JVD.  Cardiovascular:     Rate and Rhythm: Regular rhythm. Tachycardia present.     Pulses: Normal pulses.     Heart sounds: Normal heart sounds. No murmur heard.    No friction rub. No gallop.  Pulmonary:     Breath sounds: No rhonchi.     Comments: Wheezing noted bilaterally with mild bibasilar crackles. Saturating >95% on RA. No respiratory distress noted. Abdominal:     General: Bowel sounds are normal. There is no distension.     Palpations: Abdomen  is soft.     Tenderness: There is no abdominal tenderness. There is no guarding or rebound.  Musculoskeletal:     Comments: 1+ pitting edema in bilateral LE up to mid-shins.  Skin:    General: Skin is warm and dry.  Neurological:     Mental Status: He is alert and oriented to person, place, and time.  Psychiatric:        Mood and Affect: Mood normal.        Behavior: Behavior normal.     Data Reviewed:  There are no new results to review at this time.     Latest Ref Rng & Units 07/29/2023    9:24 AM 06/22/2023    4:41 AM 06/21/2023    5:12 AM  CBC  WBC 4.0 - 10.5 K/uL 10.0  5.8  5.2   Hemoglobin 13.0 - 17.0 g/dL 16.1  09.6  04.5   Hematocrit 39.0 - 52.0 % 35.0  32.4  32.5   Platelets 150 - 400 K/uL 227  153  112       Latest Ref Rng & Units 07/29/2023    9:24 AM 06/22/2023    4:41 AM 06/21/2023    5:12 AM  BMP  Glucose 70 - 99 mg/dL 409  811  914   BUN 6 - 20 mg/dL 10  11  16    Creatinine 0.61 - 1.24 mg/dL 7.82  9.56  2.13   Sodium 135 - 145 mmol/L 137  141  146   Potassium 3.5 - 5.1 mmol/L 4.3  3.6  3.5   Chloride 98 - 111 mmol/L 105  109  111   CO2 22 - 32 mmol/L 19  22  25    Calcium 8.9 - 10.3 mg/dL 8.9  8.3  8.4    BNP    Component  Value Date/Time   BNP 297.9 (H) 07/29/2023 0857   Lactic Acid, Venous    Component Value Date/Time   LATICACIDVEN 1.1 07/29/2023 1138   Troponin: 17 > 20  CT Angio Chest PE W and/or Wo Contrast Result Date: 07/29/2023 CLINICAL DATA:  Shortness of breath EXAM: CT ANGIOGRAPHY CHEST WITH CONTRAST TECHNIQUE: Multidetector CT imaging of the chest was performed using the standard protocol during bolus administration of intravenous contrast. Multiplanar CT image reconstructions and MIPs were obtained to evaluate the vascular anatomy. RADIATION DOSE REDUCTION: This exam was performed according to the departmental dose-optimization program which includes automated exposure control, adjustment of the mA and/or kV according to patient size and/or use of iterative reconstruction technique. CONTRAST:  75mL OMNIPAQUE IOHEXOL 350 MG/ML SOLN COMPARISON:  Chest x-ray 07/29/2023 and older. CT without contrast 03/31/2023 FINDINGS: Cardiovascular: Heart is nonenlarged. Thoracic aorta is normal course and caliber. Trace pericardial fluid. There is some breathing motion identified throughout the examination. This limits evaluation including nondiagnostic for small and peripheral emboli. No segmental or larger pulmonary embolism identified. Mediastinum/Nodes: Slightly patulous thoracic esophagus. Thyroid gland is unremarkable. Prominent bilateral axillary nodes are seen, less than a cm in short axis. There is some prominent hilar nodes. There are a few enlarged mediastinal nodes identified. Example to the left of the carina measures 16 x 23 mm on series 5, image 46. Focus left mediastinum at the level of the aortic arch measures 21 x 12 mm. Subcarinal node has short axis dimension of 17 mm on series 5, image 61. These are all increased from the prior CT scan of November 2024. Lungs/Pleura: Tiny bilateral pleural effusions identified. There is scattered  patchy ground-glass scattered in both lungs. Infectious, inflammatory  etiologies are in the differential. No pneumothorax. Diffuse breathing motion. The opacities have a different configuration than that of November 2024. On that exam these are more consolidative areas of opacity rather than patchy ground-glass. Upper Abdomen: Adrenal glands are preserved in the upper abdomen. Malrotated, congenital kidneys. Musculoskeletal: Scattered degenerative changes of the spine. There is extensive laminectomies in the upper thoracic spine with sclerotic heterogeneous appearance to the T3 vertebral body, unchanged from previous. Please correlate with history. Patient's history of a spinal cord angioma severely elevated right hemidiaphragm. Review of the MIP images confirms the above findings. IMPRESSION: Breathing motion.  No segmental or larger pulmonary embolism. Severely elevated right hemidiaphragm. Tiny pleural effusions. Multifocal scattered ground-glass type lung infiltrative opacities. These have a different configuration in the opacities seen previously in November 2024. There are several enlarged mediastinal lymph nodes as well which are also increased from that examination. Please correlate with clinical presentation recommend short follow-up or additional workup as clinically directed. Electronically Signed   By: Karen Kays M.D.   On: 07/29/2023 16:00   DG Chest Port 1 View Result Date: 07/29/2023 CLINICAL DATA:  Shortness of breath EXAM: PORTABLE CHEST 1 VIEW COMPARISON:  07/11/2023 x-ray and older FINDINGS: The left inferior costophrenic angle is clipped off the edge of the film. Normal cardiopericardial silhouette. No pneumothorax. Vascular congestion identified with some hazy opacity seen particularly diffusely in the left hemithorax. Acute infiltrates possible. Question tiny right effusion. IMPRESSION: Hazy diffuse parenchymal and interstitial opacity seen throughout the left hemithorax. More mild changes on the right. Question tiny right effusion. Recommend short follow-up  Electronically Signed   By: Karen Kays M.D.   On: 07/29/2023 10:18     Assessment and Plan: No notes have been filed under this hospital service. Service: Hospitalist   Recurrent dyspnea secondary to diffuse pulmonary infiltrates of unclear etiology Patient presenting with acute on chronic dyspnea.  CT chest showing multifocal scattered groundglass infiltrative opacities.  Given history of 2 recent admissions requiring mechanical ventilation, consulted pulmonology.  Current differentials include recurrent aspiration, possible esophageal dysmotility, viral pneumonia, postviral or autoimmune pneumonitis, and possibly pulmonary edema.  Fortunately, he is saturating greater than 95% on room air during my exam and not in any respiratory distress.  Will continue empiric antibiotics.  Checking respiratory viral panel, procalcitonin, strep and Legionella urinary antigen.  Per pulmonology, will also check esophagram to look for esophageal dysmotility and check echocardiogram to rule out cardiomyopathy.  May benefit from maintenance bronchodilators for symptomatic relief and will need further evaluation with outpatient PFTs. -Pulmonology following, greatly appreciate assistance -Continue Rocephin and azithromycin -Dulera -Follow-up respiratory viral panel, procalcitonin, strep urinary antigen, Legionella urinary antigen -Follow-up esophagram -Follow-up echocardiogram -Follow-up immunoglobulins -Follow-up ESR, UDS -Follow-up blood cultures x 2 -Holding off on further steroids per pulmonology -IV diuresis -Place in observation/progressive -Consider high-resolution CT and PFTs as an outpatient  Chronic diastolic HF exacerbation Last echocardiogram in 02/2023 showing LVEF 50 to 55%, normal LV function, no regional wall motion abnormalities and inability to visualize LV diastolic function.  He does have a clinical diagnosis of chronic diastolic heart failure and is currently taking Lasix 80 mg daily at  home.  He is fluid overloaded on exam with pitting edema in his lower extremities and mild bibasilar crackles noted as well.  BNP is elevated as well.  Unclear if pulmonary edema is playing a role in patient's orthopnea and recurrent dyspnea, will provide IV diuresis to  assess if this improves patient's symptoms.  Also repeating echocardiogram to rule out cardiomyopathy. -IV Lasix 40 mg -Resume home Coreg 12.5 mg twice daily -Follow-up echocardiogram -Strict I/O's -Daily weights  Hypertension Patient with blood pressures ranging in the 140s-160s/100s-120s since arrival to the ED.  Have resumed home Coreg 12.5 mg twice daily. -Continue home Coreg -Trend BP curve  NSTEMI, likely Type 2 -Initial troponin normal at 17, repeat mildly elevated at 20 -likely demand ischemia in the setting of PNA and diastolic HF exac -No active chest pain -Will trend to ensure troponins not continuing to increase but can stop checking once peaked  T2DM with hyperglycemia Patient reports taking Lantus 2 units nightly and Humalog 0-3 units 3 times daily with meals.  Unclear as to the accuracy of this regimen.  Per medication list, previously prescribed to take Lantus 35 units nightly and what appears to be a possibly moderate SSI along with it.  However given that patient reports lower doses, will start with Lantus 10 units nightly and moderate SSI.  Blood glucose on BMP elevated to 193 and CBG showing glucose level of 325.  Patient's last A1c was 8.1% in October 2024. -Follow-up hemoglobin A1c -Start with Lantus 10 units nightly and moderate SSI -Trend CBGs, goal 140-180  Iron deficiency anemia Patient with baseline hemoglobin around 10.  On admission, hemoglobin 11.  He has had microcytosis on labs dating back to over a month ago.  He is taking iron supplementation as an outpatient. -Continue iron supplementation as an outpatient    Advance Care Planning:   Code Status: Full Code   Consults:  pulmonology  Family Communication: no family at bedside  Severity of Illness: The appropriate patient status for this patient is OBSERVATION. Observation status is judged to be reasonable and necessary in order to provide the required intensity of service to ensure the patient's safety. The patient's presenting symptoms, physical exam findings, and initial radiographic and laboratory data in the context of their medical condition is felt to place them at decreased risk for further clinical deterioration. Furthermore, it is anticipated that the patient will be medically stable for discharge from the hospital within 2 midnights of admission.   Portions of this note were generated with Scientist, clinical (histocompatibility and immunogenetics). Dictation errors may occur despite best attempts at proofreading.   Author: Briscoe Burns, MD 07/29/2023 4:57 PM  For on call review [email or web address].

## 2023-07-29 NOTE — ED Provider Notes (Signed)
 [place] [facility] AT [facility] Provider Note   CSN: [id number] Arrival date & time: 07/29/23  0847     History  Chief Complaint  Patient presents with   Shortness of Breath         Gary Conway is a 48 y.o. male.  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  Pt was most recently admitted from 1/20-27 for sepsis from influenza A and ARDS and another intubation.  Pt started having sob today at work.  He was 88% on RA per EMS.  Pt given duoneb en route.  Pt is still very sob.       Home Medications Prior to Admission medications   Medication Sig Start Date End Date Taking? Authorizing Provider  blood glucose meter kit and supplies KIT Dispense based on patient and insurance preference. Use up to four times daily as directed. (FOR ICD-9 250.00, 250.01). For QAC - HS accuchecks. 09/14/19   Leroy Sea, MD  Blood Glucose Monitoring Suppl (BLOOD GLUCOSE MONITOR SYSTEM) w/Device KIT Use to test blood sugar in the morning, at noon, and at bedtime. 04/08/23   Leroy Sea, MD  carvedilol (COREG) 12.5 MG tablet Take 1 tablet (12.5 mg total) by mouth 2 (two) times daily with a meal. 04/08/23   Leroy Sea, MD  ferrous sulfate 325 (65 FE) MG tablet Take 1 tablet (325 mg total) by mouth daily with breakfast. 04/08/23   Leroy Sea, MD  furosemide (LASIX) 80 MG tablet Take 1 tablet (80 mg total) by mouth daily. 04/08/23   Leroy Sea, MD  insulin glargine (LANTUS SOLOSTAR) 100 UNIT/ML Solostar Pen Inject 35 Units into the skin daily. 04/08/23   Leroy Sea, MD  insulin lispro (HUMALOG) 100 UNIT/ML KwikPen Before each meal, inject subcutaneously 3 times a day:  140-199 - 2 units, 200-250 - 6 units, 251-299 - 8 units,  300-349 - 10 units,  350 or above 14 units. 04/08/23   Leroy Sea, MD  Insulin  Pen Needle 32G X 4 MM MISC For 4 times a day insulin Subcutaneously. 04/08/23   Leroy Sea, MD  ipratropium-albuterol (DUONEB) 0.5-2.5 (3) MG/3ML SOLN Use twice a day scheduled and every 4 hours as needed for shortness of breath and wheezing. 04/08/23   Leroy Sea, MD      Allergies    Patient has no known allergies.    Review of Systems   Review of Systems  Respiratory:  Positive for cough, shortness of breath and wheezing.   All other systems reviewed and are negative.   Physical Exam Updated Vital Signs BP (!) 143/111   Pulse (!) 107   Temp 98.8 F (37.1 C) (Oral)   Resp (!) 25   Ht 5\' 8"  (1.727 m)   Wt 81.6 kg   SpO2 96%   BMI 27.37 kg/m  Physical Exam Vitals and nursing note reviewed.  Constitutional:      General: He is in acute distress.     Appearance: He is well-developed. He is obese. He is ill-appearing.  HENT:     Head: Normocephalic and atraumatic.     Mouth/Throat:     Mouth: Mucous membranes are moist.     Pharynx: Oropharynx is clear.  Eyes:     Extraocular Movements: Extraocular movements intact.     Pupils: Pupils are  equal, round, and reactive to light.  Cardiovascular:     Rate and Rhythm: Regular rhythm. Tachycardia present.  Pulmonary:     Effort: Tachypnea present.     Breath sounds: Wheezing and rhonchi present.  Abdominal:     General: Bowel sounds are normal.     Palpations: Abdomen is soft.  Musculoskeletal:        General: Normal range of motion.     Cervical back: Normal range of motion and neck supple.  Skin:    General: Skin is warm.     Capillary Refill: Capillary refill takes less than 2 seconds.  Neurological:     General: No focal deficit present.     Mental Status: He is alert and oriented to person, place, and time.  Psychiatric:        Mood and Affect: Mood normal.        Behavior: Behavior normal.     ED Results / Procedures / Treatments   Labs (all labs ordered are listed, but only abnormal results  are displayed) Labs Reviewed  BASIC METABOLIC PANEL - Abnormal; Notable for the following components:      Result Value   CO2 19 (*)    Glucose, Bld 193 (*)    All other components within normal limits  CBC WITH DIFFERENTIAL/PLATELET - Abnormal; Notable for the following components:   Hemoglobin 11.0 (*)    HCT 35.0 (*)    MCV 71.6 (*)    MCH 22.5 (*)    RDW 17.5 (*)    Neutro Abs 8.7 (*)    All other components within normal limits  BRAIN NATRIURETIC PEPTIDE - Abnormal; Notable for the following components:   B Natriuretic Peptide 297.9 (*)    All other components within normal limits  TROPONIN I (HIGH SENSITIVITY) - Abnormal; Notable for the following components:   Troponin I (High Sensitivity) 20 (*)    All other components within normal limits  RESP PANEL BY RT-PCR (RSV, FLU A&B, COVID)  RVPGX2  CULTURE, BLOOD (ROUTINE X 2)  CULTURE, BLOOD (ROUTINE X 2)  I-STAT CG4 LACTIC ACID, ED  I-STAT CG4 LACTIC ACID, ED  TROPONIN I (HIGH SENSITIVITY)    EKG EKG Interpretation Date/Time:  Monday July 29 2023 10:16:53 EST Ventricular Rate:  108 PR Interval:  135 QRS Duration:  78 QT Interval:  338 QTC Calculation: 453 R Axis:   78  Text Interpretation: Sinus tachycardia Probable left atrial enlargement Since last tracing rate slower Confirmed by Jacalyn Lefevre [phone]) on 07/29/2023 2:05:42 PM  Radiology CT Angio Chest PE W and/or Wo Contrast Result Date: 07/29/2023 CLINICAL DATA:  Shortness of breath EXAM: CT ANGIOGRAPHY CHEST WITH CONTRAST TECHNIQUE: Multidetector CT imaging of the chest was performed using the standard protocol during bolus administration of intravenous contrast. Multiplanar CT image reconstructions and MIPs were obtained to evaluate the vascular anatomy. RADIATION DOSE REDUCTION: This exam was performed according to the departmental dose-optimization program which includes automated exposure control, adjustment of the mA and/or kV according to patient size and/or  use of iterative reconstruction technique. CONTRAST:  75mL OMNIPAQUE IOHEXOL 350 MG/ML SOLN COMPARISON:  Chest x-ray 07/29/2023 and older. CT without contrast 03/31/2023 FINDINGS: Cardiovascular: Heart is nonenlarged. Thoracic aorta is normal course and caliber. Trace pericardial fluid. There is some breathing motion identified throughout the examination. This limits evaluation including nondiagnostic for small and peripheral emboli. No segmental or larger pulmonary embolism identified. Mediastinum/Nodes: Slightly patulous thoracic esophagus. Thyroid gland is unremarkable. Prominent bilateral axillary  nodes are seen, less than a cm in short axis. There is some prominent hilar nodes. There are a few enlarged mediastinal nodes identified. Example to the left of the carina measures 16 x 23 mm on series 5, image 46. Focus left mediastinum at the level of the aortic arch measures 21 x 12 mm. Subcarinal node has short axis dimension of 17 mm on series 5, image 61. These are all increased from the prior CT scan of November 2024. Lungs/Pleura: Tiny bilateral pleural effusions identified. There is scattered patchy ground-glass scattered in both lungs. Infectious, inflammatory etiologies are in the differential. No pneumothorax. Diffuse breathing motion. The opacities have a different configuration than that of November 2024. On that exam these are more consolidative areas of opacity rather than patchy ground-glass. Upper Abdomen: Adrenal glands are preserved in the upper abdomen. Malrotated, congenital kidneys. Musculoskeletal: Scattered degenerative changes of the spine. There is extensive laminectomies in the upper thoracic spine with sclerotic heterogeneous appearance to the T3 vertebral body, unchanged from previous. Please correlate with history. Patient's history of a spinal cord angioma severely elevated right hemidiaphragm. Review of the MIP images confirms the above findings. IMPRESSION: Breathing motion.  No  segmental or larger pulmonary embolism. Severely elevated right hemidiaphragm. Tiny pleural effusions. Multifocal scattered ground-glass type lung infiltrative opacities. These have a different configuration in the opacities seen previously in November 2024. There are several enlarged mediastinal lymph nodes as well which are also increased from that examination. Please correlate with clinical presentation recommend short follow-up or additional workup as clinically directed. Electronically Signed   By: Karen Kays M.D.   On: 07/29/2023 16:00   DG Chest Port 1 View Result Date: 07/29/2023 CLINICAL DATA:  Shortness of breath EXAM: PORTABLE CHEST 1 VIEW COMPARISON:  07/11/2023 x-ray and older FINDINGS: The left inferior costophrenic angle is clipped off the edge of the film. Normal cardiopericardial silhouette. No pneumothorax. Vascular congestion identified with some hazy opacity seen particularly diffusely in the left hemithorax. Acute infiltrates possible. Question tiny right effusion. IMPRESSION: Hazy diffuse parenchymal and interstitial opacity seen throughout the left hemithorax. More mild changes on the right. Question tiny right effusion. Recommend short follow-up Electronically Signed   By: Karen Kays M.D.   On: 07/29/2023 10:18    Procedures Procedures    Medications Ordered in ED Medications  cefTRIAXone (ROCEPHIN) 1 g in sodium chloride 0.9 % 100 mL IVPB (has no administration in time range)  azithromycin (ZITHROMAX) 500 mg in sodium chloride 0.9 % 250 mL IVPB (has no administration in time range)  albuterol (PROVENTIL) (2.5 MG/3ML) 0.083% nebulizer solution 5 mg (5 mg Nebulization Given 07/29/23 0939)  methylPREDNISolone sodium succinate (SOLU-MEDROL) 125 mg/2 mL injection 125 mg (125 mg Intravenous Given 07/29/23 1121)  iohexol (OMNIPAQUE) 350 MG/ML injection 75 mL (75 mLs Intravenous Contrast Given 07/29/23 1358)    ED Course/ Medical Decision Making/ A&P                                  Medical Decision Making Amount and/or Complexity of Data Reviewed Labs: ordered. Radiology: ordered.  Risk Prescription drug management. Decision regarding hospitalization.   This patient presents to the ED for concern of sob, this involves an extensive number of treatment options, and is a complaint that carries with it a high risk of complications and morbidity.  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  The pertinent results include:  cbc nl, bmp nl,covid/flu/rsv neg, bnp sl elevated at 297, trop sl elevated at 20   Imaging Studies ordered:  I ordered imaging studies including cxr and ct chest I independently visualized and interpreted imaging which showed  CXR: Hazy diffuse parenchymal and interstitial opacity seen throughout  the left hemithorax. More mild changes on the right. Question tiny  right effusion. Recommend short follow-up  CT chest: Breathing motion.  No segmental or larger pulmonary embolism.    Severely elevated right hemidiaphragm.    Tiny pleural effusions. Multifocal scattered ground-glass type lung  infiltrative opacities. These have a different configuration in the  opacities seen previously in November 2024.    There are several enlarged mediastinal lymph nodes as well which are  also increased from that examination.    Please correlate with clinical presentation recommend short  follow-up or additional workup as clinically directed.   I agree with the radiologist interpretation   Cardiac Monitoring:  The  patient was maintained on a cardiac monitor.  I personally viewed and interpreted the cardiac monitored which showed an underlying rhythm of: st   Medicines ordered and prescription drug management:  I ordered medication including alb/solumedrol  for sx  Reevaluation of the patient after these medicines showed that the patient improved I have reviewed the patients home medicines and have made adjustments as needed   Test Considered:  ct   Critical Interventions:  oxygen   Consultations Obtained:  I requested consultation with the hospitalist (Dr. Austin Miles),  and discussed lab and imaging findings as well as pertinent plan -he will admit   Problem List / ED Course:  RAD:  sob has improved with nebs and steroids.  He is requiring 2L oxygen to keep sats over 90.  He said he feels fine sitting still, but gets very sob with any movement. CAP:  pt started on rocephin/zithromax   Reevaluation:  After the interventions noted above, I reevaluated the patient and found that they have :improved   Social Determinants of Health:  Lives at home   Dispostion:  After consideration of the diagnostic results and the patients response to treatment, I feel that the patent would benefit from admission.  CRITICAL CARE Performed by: Jacalyn Lefevre   Total critical care time: 30 minutes  Critical care time was exclusive of separately billable procedures and treating other patients.  Critical care was necessary to treat or prevent imminent or life-threatening deterioration.  Critical care was time spent personally by me on the following activities: development of treatment plan with patient and/or surrogate as well as nursing, discussions with consultants, evaluation of patient's response to treatment, examination of patient, obtaining history from patient or surrogate, ordering and performing treatments and interventions, ordering and review of laboratory studies, ordering and review of  radiographic studies, pulse oximetry and re-evaluation of patient's condition.           Final Clinical Impression(s) / ED Diagnoses Final diagnoses:  Community acquired pneumonia, unspecified laterality  Hypoxia  Mild intermittent reactive airway disease with acute exacerbation    Rx / DC Orders ED Discharge Orders     None  Jacalyn Lefevre, MD 07/29/23 [phone]

## 2023-07-30 ENCOUNTER — Observation Stay (HOSPITAL_COMMUNITY): Payer: MEDICAID

## 2023-07-30 ENCOUNTER — Encounter (HOSPITAL_COMMUNITY): Payer: Self-pay | Admitting: Student

## 2023-07-30 ENCOUNTER — Observation Stay (HOSPITAL_BASED_OUTPATIENT_CLINIC_OR_DEPARTMENT_OTHER): Payer: MEDICAID

## 2023-07-30 DIAGNOSIS — R0609 Other forms of dyspnea: Secondary | ICD-10-CM

## 2023-07-30 LAB — GLUCOSE, CAPILLARY
Glucose-Capillary: 243 mg/dL — ABNORMAL HIGH (ref 70–99)
Glucose-Capillary: 295 mg/dL — ABNORMAL HIGH (ref 70–99)
Glucose-Capillary: 214 mg/dL — ABNORMAL HIGH (ref 70–99)
Glucose-Capillary: 178 mg/dL — ABNORMAL HIGH (ref 70–99)

## 2023-07-30 LAB — ECHOCARDIOGRAM COMPLETE
Area-P 1/2: 7.02 cm2
Calc EF: 61.4 %
Height: 68 in
MV VTI: 1.82 cm2
S' Lateral: 3.1 cm
Single Plane A2C EF: 63.7 %
Single Plane A4C EF: 63.8 %
Weight: 3132.3 [oz_av]

## 2023-07-30 LAB — BASIC METABOLIC PANEL
Anion gap: 9 (ref 5–15)
BUN: 24 mg/dL — ABNORMAL HIGH (ref 6–20)
CO2: 23 mmol/L (ref 22–32)
Calcium: 8.6 mg/dL — ABNORMAL LOW (ref 8.9–10.3)
Chloride: 103 mmol/L (ref 98–111)
Creatinine, Ser: 1.62 mg/dL — ABNORMAL HIGH (ref 0.61–1.24)
GFR, Estimated: 52 mL/min — ABNORMAL LOW (ref >60)
Glucose, Bld: 272 mg/dL — ABNORMAL HIGH (ref 70–99)
Potassium: 4.2 mmol/L (ref 3.5–5.1)
Sodium: 135 mmol/L (ref 135–145)

## 2023-07-30 LAB — CBC
HCT: 30 % — ABNORMAL LOW (ref 39.0–52.0)
Hemoglobin: 9.6 g/dL — ABNORMAL LOW (ref 13.0–17.0)
MCH: 22.6 pg — ABNORMAL LOW (ref 26.0–34.0)
MCHC: 32 g/dL (ref 30.0–36.0)
MCV: 70.8 fL — ABNORMAL LOW (ref 80.0–100.0)
Platelets: 231 10*3/uL (ref 150–400)
RBC: 4.24 MIL/uL (ref 4.22–5.81)
RDW: 17.7 % — ABNORMAL HIGH (ref 11.5–15.5)
WBC: 13.3 10*3/uL — ABNORMAL HIGH (ref 4.0–10.5)
nRBC: 0 % (ref 0.0–0.2)

## 2023-07-30 LAB — C-REACTIVE PROTEIN: CRP: 0.9 mg/dL (ref <1.0)

## 2023-07-30 LAB — PROCALCITONIN: Procalcitonin: 0.1 ng/mL

## 2023-07-30 LAB — BRAIN NATRIURETIC PEPTIDE: B Natriuretic Peptide: 338.9 pg/mL — ABNORMAL HIGH (ref 0.0–100.0)

## 2023-07-30 MED ORDER — SODIUM CHLORIDE 0.9 % IV BOLUS
250.0000 mL | Freq: Once | INTRAVENOUS | Status: AC
  Administered 2023-07-30: 250 mL via INTRAVENOUS

## 2023-07-30 MED ORDER — INSULIN GLARGINE 100 UNIT/ML ~~LOC~~ SOLN
15.0000 [IU] | Freq: Every day | SUBCUTANEOUS | Status: DC
  Administered 2023-07-30 – 2023-07-31 (×2): 15 [IU] via SUBCUTANEOUS
  Filled 2023-07-30 (×3): qty 0.15

## 2023-07-30 MED ORDER — INSULIN ASPART 100 UNIT/ML IJ SOLN
0.0000 [IU] | Freq: Three times a day (TID) | INTRAMUSCULAR | Status: DC
  Administered 2023-07-30: 5 [IU] via SUBCUTANEOUS
  Administered 2023-07-30: 8 [IU] via SUBCUTANEOUS
  Administered 2023-07-31 (×2): 3 [IU] via SUBCUTANEOUS
  Administered 2023-07-31: 5 [IU] via SUBCUTANEOUS
  Administered 2023-08-01: 3 [IU] via SUBCUTANEOUS
  Administered 2023-08-01: 5 [IU] via SUBCUTANEOUS
  Administered 2023-08-01: 3 [IU] via SUBCUTANEOUS
  Administered 2023-08-02: 8 [IU] via SUBCUTANEOUS
  Administered 2023-08-02: 3 [IU] via SUBCUTANEOUS

## 2023-07-30 MED ORDER — ISOSORB DINITRATE-HYDRALAZINE 20-37.5 MG PO TABS
2.0000 | ORAL_TABLET | Freq: Three times a day (TID) | ORAL | Status: DC
  Administered 2023-07-30 – 2023-08-01 (×6): 2 via ORAL
  Filled 2023-07-30 (×6): qty 2

## 2023-07-30 MED ORDER — METOPROLOL TARTRATE 5 MG/5ML IV SOLN
2.5000 mg | Freq: Once | INTRAVENOUS | Status: AC
  Administered 2023-07-31: 2.5 mg via INTRAVENOUS
  Filled 2023-07-30: qty 5

## 2023-07-30 MED ORDER — INSULIN ASPART 100 UNIT/ML IJ SOLN
0.0000 [IU] | Freq: Every day | INTRAMUSCULAR | Status: DC
  Administered 2023-07-31 – 2023-08-01 (×2): 2 [IU] via SUBCUTANEOUS

## 2023-07-30 MED ORDER — PERFLUTREN LIPID MICROSPHERE
1.0000 mL | INTRAVENOUS | Status: AC | PRN
  Administered 2023-07-30: 3 mL via INTRAVENOUS

## 2023-07-30 MED ORDER — ALUM & MAG HYDROXIDE-SIMETH 200-200-20 MG/5ML PO SUSP
30.0000 mL | ORAL | Status: DC | PRN
Start: 2023-07-30 — End: 2023-08-02
  Administered 2023-07-30: 30 mL via ORAL
  Filled 2023-07-30: qty 30

## 2023-07-30 NOTE — Plan of Care (Signed)
  Problem: Metabolic: Goal: Ability to maintain appropriate glucose levels will improve Outcome: Progressing   Problem: Tissue Perfusion: Goal: Adequacy of tissue perfusion will improve Outcome: Progressing   Problem: Clinical Measurements: Goal: Will remain free from infection Outcome: Progressing Goal: Diagnostic test results will improve Outcome: Progressing Goal: Respiratory complications will improve Outcome: Progressing   Problem: Activity: Goal: Risk for activity intolerance will decrease Outcome: Progressing

## 2023-07-30 NOTE — Progress Notes (Addendum)
 PROGRESS NOTE                                                                                                                                                                                                             Patient Demographics:    Gary Conway, is a 48 y.o. male, DOB - Oct 18, 1975, MVH:[id number]  Outpatient Primary MD for the patient is Patient, No Pcp Per    LOS - 0  Admit date - 07/29/2023    Chief Complaint  Patient presents with   Shortness of Breath            Brief Narrative (HPI from H&P)   48 y.o. male with medical history significant of type 2 diabetes, benign spinal cord neoplasm (s/p resection 2015), ARDS requiring mechanical ventilation x 2 presenting to the ED with shortness of breath.  Previously he also had evidence of acute on chronic diastolic CHF on his earlier admission in November, his shortness of breath this time around is exertional in nature he also has orthopnea, CTA ruled out PE but he did have bilateral groundglass opacities.  He was placed on oxygen, started on diuretics along with steroids and antibiotics, seen by [facility] and admitted by hospitalist team for shortness of breath evaluation and treatment.   Subjective:    Gary Conway today has, No headache, No chest pain, No abdominal pain - No Nausea, No new weakness tingling or numbness, +ve orthopnea and exertional shortness of breath   Assessment  & Plan :    Acute hypoxic respiratory failure, exertional shortness of breath orthopnea, CTA showing diffuse pulmonary groundglass opacities.  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  Presentation this time suspicious for CHF  along with atypical infection is also possible, he has been placed on IV steroids 1 dose given in the ER, empiric antibiotics for atypical infection along with IV diuretics, his chest x-ray has significantly improved and he has shown clinical improvement as well wondering if CHF is playing a prominent role this time around, echo is stable with preserved EF continues to have diastolic dysfunction, PCCM on board.  Requested to sit in chair  use I-S and flutter valve for pulmonary toiletry, advance activity and titrate down oxygen.  Continue diuretics and antibiotics.  Hypertension.  Blood pressure still quite elevated, placed on Coreg, add BiDil and monitor.  Continue diuretics..   Chronic iron deficiency anemia.  Continue supplementation outpatient PCP and GI follow-up.  CKD 3B.  Baseline creatinine appears to be close to 1.5.  Monitor closely with diuretics and recent IV dye for CTA.    DM Type II.  On Lantus and sliding scale monitor and adjust.  If pulmonary places him on steroids on a scheduled basis then will require further adjustment.  Lab Results  Component Value Date   HGBA1C 6.8 (H) 07/29/2023   CBG (last 3)  Recent Labs    07/29/23 1744 07/29/23 2117 07/30/23 0839  GLUCAP 325* 401* 243*        Condition - Fair  Family Communication  :  None  Code Status :  Full  Consults  :  PCCM  PUD Prophylaxis :    Procedures  :     CTA  Breathing motion.  No segmental or larger pulmonary embolism. Severely elevated right hemidiaphragm. Tiny pleural effusions. Multifocal scattered ground-glass type lung infiltrative opacities. These have a different configuration in the opacities seen previously in November 2024. There are several enlarged mediastinal lymph nodes as well which are also increased from that examination. Please correlate with clinical presentation recommend short follow-up or additional workup as clinically directed.  TTE - 1. Left ventricular ejection fraction, by  estimation, is 55 to 60%. The left ventricle has normal function. The left ventricle has no regional wall motion abnormalities. Indeterminate diastolic filling due to E-A fusion.  2. Right ventricular systolic function is normal. The right ventricular size is normal. Tricuspid regurgitation signal is inadequate for assessing PA pressure.  3. The mitral valve is grossly normal. Trivial mitral valve regurgitation. No evidence of mitral stenosis.  4. The aortic valve is tricuspid. Aortic valve regurgitation is not visualized. No aortic stenosis is present.  5. The inferior vena cava is normal in size with greater than 50% respiratory variability, suggesting right atrial pressure of 3 mmHg.      Disposition Plan  :    Status is: Observation   DVT Prophylaxis  :    enoxaparin (LOVENOX) injection 40 mg Start: 07/29/23 1630  Lab Results  Component Value Date   PLT 231 07/30/2023    Diet :  Diet Order             Diet Carb Modified Fluid consistency: Thin; Room service appropriate? Yes  Diet effective now                    Inpatient Medications  Scheduled Meds:  carvedilol  12.5 mg Oral BID WC   enoxaparin (LOVENOX) injection  40 mg Subcutaneous Q24H   furosemide  40 mg Intravenous Daily   insulin aspart  0-15 Units Subcutaneous TID PC & HS   insulin glargine  10 Units Subcutaneous QHS   mometasone-formoterol  2 puff Inhalation BID   Continuous Infusions:  azithromycin     cefTRIAXone (ROCEPHIN)  IV     PRN Meds:.acetaminophen **OR** acetaminophen, perflutren lipid microspheres (DEFINITY) IV suspension  Antibiotics  :    Anti-infectives (From admission, onward)    Start     Dose/Rate Route Frequency Ordered Stop   07/30/23 1600  cefTRIAXone (ROCEPHIN) 2 g in sodium chloride 0.9 % 100 mL IVPB  2 g 200 mL/hr over 30 Minutes Intravenous Every 24 hours 07/29/23 1849     07/30/23 1600  azithromycin (ZITHROMAX) 500 mg in sodium chloride 0.9 % 250 mL IVPB        500  mg 250 mL/hr over 60 Minutes Intravenous Every 24 hours 07/29/23 1849     07/29/23 1615  cefTRIAXone (ROCEPHIN) 1 g in sodium chloride 0.9 % 100 mL IVPB        1 g 200 mL/hr over 30 Minutes Intravenous  Once 07/29/23 1606 07/29/23 1708   07/29/23 1615  azithromycin (ZITHROMAX) 500 mg in sodium chloride 0.9 % 250 mL IVPB        500 mg 250 mL/hr over 60 Minutes Intravenous  Once 07/29/23 1606 07/29/23 1811         Objective:   Vitals:   07/30/23 0500 07/30/23 0549 07/30/23 0743 07/30/23 0847  BP:  (!) 152/108 (!) 141/110   Pulse:  (!) 107 (!) 109   Resp:  (!) 22 (!) 26   Temp:  97.8 F (36.6 C) 98.2 F (36.8 C)   TempSrc:  Oral Oral   SpO2:  93% 97% 100%  Weight: 88.8 kg     Height:        Wt Readings from Last 3 Encounters:  07/30/23 88.8 kg  07/11/23 83.6 kg  06/24/23 84 kg     Intake/Output Summary (Last 24 hours) at 07/30/2023 0921 Last data filed at 07/30/2023 0840 Gross per 24 hour  Intake 250.05 ml  Output 900 ml  Net -649.95 ml     Physical Exam  Awake Alert, No new F.N deficits, Normal affect [place].AT,PERRAL Supple Neck, No JVD,   Symmetrical Chest wall movement, Good air movement bilaterally, +ve rales RRR,No Gallops,Rubs or new Murmurs,  +ve B.Sounds, Abd Soft, No tenderness,   No Cyanosis, Clubbing or edema          Data Review:    Recent Labs  Lab 07/29/23 0924 07/30/23 0450  WBC 10.0 13.3*  HGB 11.0* 9.6*  HCT 35.0* 30.0*  PLT 227 231  MCV 71.6* 70.8*  MCH 22.5* 22.6*  MCHC 31.4 32.0  RDW 17.5* 17.7*  LYMPHSABS 0.8  --   MONOABS 0.4  --   EOSABS 0.1  --   BASOSABS 0.0  --     Recent Labs  Lab 07/29/23 0857 07/29/23 0924 07/29/23 0943 07/29/23 1138 07/29/23 1824 07/29/23 2058 07/30/23 0450  NA  --  137  --   --   --   --  135  K  --  4.3  --   --   --   --  4.2  CL  --  105  --   --   --   --  103  CO2  --  19*  --   --   --   --  23  ANIONGAP  --  13  --   --   --   --  9  GLUCOSE  --  193*  --   --   --   --  272*  BUN   --  10  --   --   --   --  24*  CREATININE  --  1.19  --   --   --   --  1.62*  CRP  --   --   --   --  0.9  --   --   PROCALCITON  --   --   --   --  <  0.10  --  0.10  LATICACIDVEN  --   --  1.5 1.1  --   --   --   HGBA1C  --   --   --   --   --  6.8*  --   BNP 297.9*  --   --   --   --   --  338.9*  CALCIUM  --  8.9  --   --   --   --  8.6*      Recent Labs  Lab 07/29/23 0857 07/29/23 0924 07/29/23 0943 07/29/23 1138 07/29/23 1824 07/29/23 2058 07/30/23 0450  CRP  --   --   --   --  0.9  --   --   PROCALCITON  --   --   --   --  <0.10  --  0.10  LATICACIDVEN  --   --  1.5 1.1  --   --   --   HGBA1C  --   --   --   --   --  6.8*  --   BNP 297.9*  --   --   --   --   --  338.9*  CALCIUM  --  8.9  --   --   --   --  8.6*    --------------------------------------------------------------------------------------------------------------- Lab Results  Component Value Date   TRIG 229 (H) 03/25/2023    Lab Results  Component Value Date   HGBA1C 6.8 (H) 07/29/2023   No results for input(s): "TSH", "T4TOTAL", "FREET4", "T3FREE", "THYROIDAB" in the last 72 hours. No results for input(s): "VITAMINB12", "FOLATE", "FERRITIN", "TIBC", "IRON", "RETICCTPCT" in the last 72 hours. ------------------------------------------------------------------------------------------------------------------ Cardiac Enzymes No results for input(s): "CKMB", "TROPONINI", "MYOGLOBIN" in the last 168 hours.  Invalid input(s): "CK"  Micro Results Recent Results (from the past 240 hours)  Resp panel by RT-PCR (RSV, Flu A&B, Covid) Anterior Nasal Swab     Status: None   Collection Time: 07/29/23  8:58 AM   Specimen: Anterior Nasal Swab  Result Value Ref Range Status   SARS Coronavirus 2 by RT PCR NEGATIVE NEGATIVE Final   Influenza A by PCR NEGATIVE NEGATIVE Final   Influenza B by PCR NEGATIVE NEGATIVE Final    Comment: (NOTE) The Xpert Xpress SARS-CoV-2/FLU/RSV plus assay is intended as an aid in  the diagnosis of influenza from Nasopharyngeal swab specimens and should not be used as a sole basis for treatment. Nasal washings and aspirates are unacceptable for Xpert Xpress SARS-CoV-2/FLU/RSV testing.  Fact Sheet for Patients: BloggerCourse.com  Fact Sheet for Healthcare Providers: SeriousBroker.it  This test is not yet approved or cleared by the [place] FDA and has been authorized for detection and/or diagnosis of SARS-CoV-2 by FDA under an Emergency Use Authorization (EUA). This EUA will remain in effect (meaning this test can be used) for the duration of the COVID-19 declaration under Section 564(b)(1) of the Act, 21 U.S.C. section 360bbb-3(b)(1), unless the authorization is terminated or revoked.     Resp Syncytial Virus by PCR NEGATIVE NEGATIVE Final    Comment: (NOTE) Fact Sheet for Patients: BloggerCourse.com  Fact Sheet for Healthcare Providers: SeriousBroker.it  This test is not yet approved or cleared by the [place] FDA and has been authorized for detection and/or diagnosis of SARS-CoV-2 by FDA under an Emergency Use Authorization (EUA). This EUA will remain in effect (meaning this test can be used) for the duration of the COVID-19 declaration under Section 564(b)(1) of the Act, 21 U.S.C. section  360bbb-3(b)(1), unless the authorization is terminated or revoked.  Performed at [facility], [address] [place], [place] [address]   Culture, blood (routine x 2)     Status: None (Preliminary result)   Collection Time: 07/29/23  9:24 AM   Specimen: BLOOD RIGHT HAND  Result Value Ref Range Status   Specimen Description BLOOD RIGHT HAND  Final   Special Requests   Final    BOTTLES DRAWN AEROBIC ONLY Blood Culture results may not be optimal due to an inadequate volume of blood received in culture bottles   Culture   Final    NO GROWTH < 24  HOURS Performed at [facility], [address], [place], [place] [address]    Report Status PENDING  Incomplete  Culture, blood (routine x 2)     Status: None (Preliminary result)   Collection Time: 07/29/23  9:34 AM   Specimen: BLOOD LEFT HAND  Result Value Ref Range Status   Specimen Description BLOOD LEFT HAND  Final   Special Requests   Final    BOTTLES DRAWN AEROBIC AND ANAEROBIC Blood Culture results may not be optimal due to an inadequate volume of blood received in culture bottles   Culture   Final    NO GROWTH < 24 HOURS Performed at [facility], [address], [place], [place] [address]    Report Status PENDING  Incomplete  Respiratory (~20 pathogens) panel by PCR     Status: None   Collection Time: 07/29/23  6:13 PM   Specimen: Nasopharyngeal Swab; Respiratory  Result Value Ref Range Status   Adenovirus NOT DETECTED NOT DETECTED Final   Coronavirus 229E NOT DETECTED NOT DETECTED Final    Comment: (NOTE) The Coronavirus on the Respiratory Panel, DOES NOT test for the novel  Coronavirus (2019 nCoV)    Coronavirus HKU1 NOT DETECTED NOT DETECTED Final   Coronavirus NL63 NOT DETECTED NOT DETECTED Final   Coronavirus OC43 NOT DETECTED NOT DETECTED Final   Metapneumovirus NOT DETECTED NOT DETECTED Final   Rhinovirus / Enterovirus NOT DETECTED NOT DETECTED Final   Influenza A NOT DETECTED NOT DETECTED Final   Influenza B NOT DETECTED NOT DETECTED Final   Parainfluenza Virus 1 NOT DETECTED NOT DETECTED Final   Parainfluenza Virus 2 NOT DETECTED NOT DETECTED Final   Parainfluenza Virus 3 NOT DETECTED NOT DETECTED Final   Parainfluenza Virus 4 NOT DETECTED NOT DETECTED Final   Respiratory Syncytial Virus NOT DETECTED NOT DETECTED Final   Bordetella pertussis NOT DETECTED NOT DETECTED Final   Bordetella Parapertussis NOT DETECTED NOT DETECTED Final   Chlamydophila pneumoniae NOT DETECTED NOT DETECTED Final   Mycoplasma pneumoniae NOT DETECTED NOT DETECTED Final     Comment: Performed at [facility], [address], [place], [place] [address]    Radiology Report ECHOCARDIOGRAM COMPLETE Result Date: 07/30/2023    ECHOCARDIOGRAM REPORT   Patient Name:   LAZARO ISENHOWER Date of Exam: 07/30/2023 Medical Rec #:  [id number]        Height:       68.0 in Accession #:    [id number]       Weight:       195.8 lb Date of Birth:  06/14/1975        BSA:          2.025 m Patient Age:    47 years         BP:  141/110 mmHg Patient Gender: M                HR:           106 bpm. Exam Location:  Inpatient Procedure: 2D Echo, Cardiac Doppler, Color Doppler and Intracardiac            Opacification Agent (Both Spectral and Color Flow Doppler were            utilized during procedure). Indications:    Dyspnea  History:        Patient has prior history of Echocardiogram examinations, most                 recent 03/25/2023. Risk Factors:Hypertension. Shock.  Sonographer:    Vern Claude Referring Phys: [id number] PETER E BABCOCK IMPRESSIONS  1. Left ventricular ejection fraction, by estimation, is 55 to 60%. The left ventricle has normal function. The left ventricle has no regional wall motion abnormalities. Indeterminate diastolic filling due to E-A fusion.  2. Right ventricular systolic function is normal. The right ventricular size is normal. Tricuspid regurgitation signal is inadequate for assessing PA pressure.  3. The mitral valve is grossly normal. Trivial mitral valve regurgitation. No evidence of mitral stenosis.  4. The aortic valve is tricuspid. Aortic valve regurgitation is not visualized. No aortic stenosis is present.  5. The inferior vena cava is normal in size with greater than 50% respiratory variability, suggesting right atrial pressure of 3 mmHg. FINDINGS  Left Ventricle: Left ventricular ejection fraction, by estimation, is 55 to 60%. The left ventricle has normal function. The left ventricle has no regional wall motion abnormalities. Definity contrast agent was  given IV to delineate the left ventricular  endocardial borders. The left ventricular internal cavity size was normal in size. There is no left ventricular hypertrophy. Indeterminate diastolic filling due to E-A fusion. Right Ventricle: The right ventricular size is normal. No increase in right ventricular wall thickness. Right ventricular systolic function is normal. Tricuspid regurgitation signal is inadequate for assessing PA pressure. Left Atrium: Left atrial size was normal in size. Right Atrium: Right atrial size was normal in size. Pericardium: Trivial pericardial effusion is present. Mitral Valve: The mitral valve is grossly normal. Trivial mitral valve regurgitation. No evidence of mitral valve stenosis. MV peak gradient, 8.3 mmHg. The mean mitral valve gradient is 3.0 mmHg. Tricuspid Valve: The tricuspid valve is grossly normal. Tricuspid valve regurgitation is trivial. No evidence of tricuspid stenosis. Aortic Valve: The aortic valve is tricuspid. Aortic valve regurgitation is not visualized. No aortic stenosis is present. Pulmonic Valve: The pulmonic valve was grossly normal. Pulmonic valve regurgitation is not visualized. No evidence of pulmonic stenosis. Aorta: The aortic root and ascending aorta are structurally normal, with no evidence of dilitation. Venous: The inferior vena cava is normal in size with greater than 50% respiratory variability, suggesting right atrial pressure of 3 mmHg. IAS/Shunts: The atrial septum is grossly normal.  LEFT VENTRICLE PLAX 2D LVIDd:         4.60 cm      Diastology LVIDs:         3.10 cm      LV e' medial:    9.68 cm/s LV PW:         1.10 cm      LV E/e' medial:  12.8 LV IVS:        0.80 cm      LV e' lateral:   8.70 cm/s LVOT diam:  1.80 cm      LV E/e' lateral: 14.3 LV SV:         43 LV SV Index:   21 LVOT Area:     2.54 cm  LV Volumes (MOD) LV vol d, MOD A2C: 98.5 ml LV vol d, MOD A4C: 125.0 ml LV vol s, MOD A2C: 35.8 ml LV vol s, MOD A4C: 45.3 ml LV SV MOD  A2C:     62.7 ml LV SV MOD A4C:     125.0 ml LV SV MOD BP:      68.9 ml RIGHT VENTRICLE             IVC RV Basal diam:  3.50 cm     IVC diam: 1.20 cm RV S prime:     12.30 cm/s LEFT ATRIUM           Index        RIGHT ATRIUM           Index LA diam:      3.50 cm 1.73 cm/m   RA Area:     12.80 cm LA Vol (A2C): 42.4 ml 20.94 ml/m  RA Volume:   28.90 ml  14.27 ml/m LA Vol (A4C): 48.0 ml 23.70 ml/m  AORTIC VALVE             PULMONIC VALVE LVOT Vmax:   103.05 cm/s PV Vmax:       0.91 m/s LVOT Vmean:  67.050 cm/s PV Peak grad:  3.3 mmHg LVOT VTI:    0.170 m  AORTA Ao Root diam: 3.10 cm Ao Asc diam:  2.70 cm MITRAL VALVE MV Area (PHT): 7.02 cm     SHUNTS MV Area VTI:   1.82 cm     Systemic VTI:  0.17 m MV Peak grad:  8.3 mmHg     Systemic Diam: 1.80 cm MV Mean grad:  3.0 mmHg MV Vmax:       1.44 m/s MV Vmean:      82.3 cm/s MV Decel Time: 108 msec MV E velocity: 124.00 cm/s Lennie Odor MD Electronically signed by Lennie Odor MD Signature Date/Time: 07/30/2023/8:38:57 AM    Final    DG Chest Port 1 View Result Date: 07/30/2023 CLINICAL DATA:  [id number] with shortness of breath.  No chest pain. EXAM: PORTABLE CHEST 1 VIEW COMPARISON:  Portable chest yesterday at 9:07 p.m., CTA chest yesterday at 1:54 a.m. FINDINGS: 6:30 a.m. There is significant improvement in bilateral airspace disease, with scattered airspace disease remaining with a basal gradient. The opacities are considerably less widespread and confluent today consistent with improving fluid balance/edema and/or improving pneumonia. Small pleural effusions appear similar as well as elevated right hemidiaphragm. The mediastinum is stable. The cardiac size is normal. No new osseous findings. IMPRESSION: 1. Significant improvement in bilateral airspace disease, with scattered airspace disease remaining with a basal gradient. 2. The opacities are considerably less widespread and confluent today consistent with improving fluid balance/edema and/or improving  pneumonia. 3. Small pleural effusions appear similar. Electronically Signed   By: Almira Bar M.D.   On: 07/30/2023 07:25   CT Angio Chest PE W and/or Wo Contrast Result Date: 07/29/2023 CLINICAL DATA:  Shortness of breath EXAM: CT ANGIOGRAPHY CHEST WITH CONTRAST TECHNIQUE: Multidetector CT imaging of the chest was performed using the standard protocol during bolus administration of intravenous contrast. Multiplanar CT image reconstructions and MIPs were obtained to evaluate the vascular anatomy. RADIATION DOSE REDUCTION: This exam was performed according to the  departmental dose-optimization program which includes automated exposure control, adjustment of the mA and/or kV according to patient size and/or use of iterative reconstruction technique. CONTRAST:  75mL OMNIPAQUE IOHEXOL 350 MG/ML SOLN COMPARISON:  Chest x-ray 07/29/2023 and older. CT without contrast 03/31/2023 FINDINGS: Cardiovascular: Heart is nonenlarged. Thoracic aorta is normal course and caliber. Trace pericardial fluid. There is some breathing motion identified throughout the examination. This limits evaluation including nondiagnostic for small and peripheral emboli. No segmental or larger pulmonary embolism identified. Mediastinum/Nodes: Slightly patulous thoracic esophagus. Thyroid gland is unremarkable. Prominent bilateral axillary nodes are seen, less than a cm in short axis. There is some prominent hilar nodes. There are a few enlarged mediastinal nodes identified. Example to the left of the carina measures 16 x 23 mm on series 5, image 46. Focus left mediastinum at the level of the aortic arch measures 21 x 12 mm. Subcarinal node has short axis dimension of 17 mm on series 5, image 61. These are all increased from the prior CT scan of November 2024. Lungs/Pleura: Tiny bilateral pleural effusions identified. There is scattered patchy ground-glass scattered in both lungs. Infectious, inflammatory etiologies are in the differential. No  pneumothorax. Diffuse breathing motion. The opacities have a different configuration than that of November 2024. On that exam these are more consolidative areas of opacity rather than patchy ground-glass. Upper Abdomen: Adrenal glands are preserved in the upper abdomen. Malrotated, congenital kidneys. Musculoskeletal: Scattered degenerative changes of the spine. There is extensive laminectomies in the upper thoracic spine with sclerotic heterogeneous appearance to the T3 vertebral body, unchanged from previous. Please correlate with history. Patient's history of a spinal cord angioma severely elevated right hemidiaphragm. Review of the MIP images confirms the above findings. IMPRESSION: Breathing motion.  No segmental or larger pulmonary embolism. Severely elevated right hemidiaphragm. Tiny pleural effusions. Multifocal scattered ground-glass type lung infiltrative opacities. These have a different configuration in the opacities seen previously in November 2024. There are several enlarged mediastinal lymph nodes as well which are also increased from that examination. Please correlate with clinical presentation recommend short follow-up or additional workup as clinically directed. Electronically Signed   By: Karen Kays M.D.   On: 07/29/2023 16:00   DG Chest Port 1 View Result Date: 07/29/2023 CLINICAL DATA:  Shortness of breath EXAM: PORTABLE CHEST 1 VIEW COMPARISON:  07/11/2023 x-ray and older FINDINGS: The left inferior costophrenic angle is clipped off the edge of the film. Normal cardiopericardial silhouette. No pneumothorax. Vascular congestion identified with some hazy opacity seen particularly diffusely in the left hemithorax. Acute infiltrates possible. Question tiny right effusion. IMPRESSION: Hazy diffuse parenchymal and interstitial opacity seen throughout the left hemithorax. More mild changes on the right. Question tiny right effusion. Recommend short follow-up Electronically Signed   By: Karen Kays M.D.   On: 07/29/2023 10:18     Signature  -   Susa Raring M.D on 07/30/2023 at 9:21 AM   -  To page go to [email or web address]

## 2023-07-30 NOTE — Progress Notes (Signed)
 Heart Failure Nurse Navigator Progress Note  PCP: Patient, No Pcp Per PCP-Cardiologist: None Admission Diagnosis: Hypoxia, Community Acquired pneumonia,  Admitted from: work via EMS  Presentation:   Justice Britain presented with on and off walking pneumonia,shortness of breath, + 1 bilateral pitting edema,  oxygen sats were 88 % on RA upon EMS arrival. BP 200/130, HR 120, patient was intubated in October of 2024 for diffuse alveolar hemorrhage/ ARDS, most recently admitted for sepsis from influenza A and ARDS 06/17/23-06/24/23 and intubated again.BNP 338, CXR showing hazy diffuse parenchymal and interstitial opacities.CT angio chest showing multifocal scattered groundglass lung opacities, severely elevated right hemidiaphragm, several enlarged mediastinal lymph nodes likely reactive.     Patient was educated on the sign and symptoms of heart failure, daily weights, when to call his doctor or go to the ED. Diet/ fluid restrictions, patient reported to drinking Gatorade flavor packets in water daily, education on taking all medications as prescribed and attending all medical appointments. Patient verbalized his understanding of all education. A HF TOC appointment was scheduled for 08/06/2023 @ 2:45 pm.   ECHO/ LVEF: 55-60%  Clinical Course:  Past Medical History:  Diagnosis Date   Diabetes mellitus, type II (HCC) 2015   Diagnosed during 2015 admission to [facility]   Hemangioma of spine 2015   S/P excision at [facility] with pathology favoring benign Hemangioma     Social History   Socioeconomic History   Marital status: Single    Spouse name: Not on file   Number of children: Not on file   Years of education: Not on file   Highest education level: Not on file  Occupational History   Not on file  Tobacco Use   Smoking status: Never   Smokeless tobacco: Never  Vaping Use   Vaping status: Never Used  Substance and Sexual Activity   Alcohol use: Yes   Drug use: No   Sexual activity: Not  Currently  Other Topics Concern   Not on file  Social History Narrative   Not on file   Social Drivers of Health   Financial Resource Strain: Not on file  Food Insecurity: No Food Insecurity (07/29/2023)   Hunger Vital Sign    Worried About Running Out of Food in the Last Year: Never true    Ran Out of Food in the Last Year: Never true  Transportation Needs: No Transportation Needs (07/29/2023)   PRAPARE - Administrator, Civil Service (Medical): No    Lack of Transportation (Non-Medical): No  Physical Activity: Not on file  Stress: Not on file  Social Connections: Not on file   Education Assessment and Provision:  Detailed education and instructions provided on heart failure disease management including the following:  Signs and symptoms of Heart Failure When to call the physician Importance of daily weights Low sodium diet Fluid restriction Medication management Anticipated future follow-up appointments  Patient education given on each of the above topics.  Patient acknowledges understanding via teach back method and acceptance of all instructions.  Education Materials:  "Living Better With Heart Failure" Booklet, HF zone tool, & Daily Weight Tracker Tool.  Patient has scale at home: Yes Patient has pill box at home: NA    High Risk Criteria for Readmission and/or Poor Patient Outcomes: Heart failure hospital admissions (last 6 months): 0  No Show rate: 5 % Difficult social situation: No, lives with his friend.  Demonstrates medication adherence: Yes Primary Language: English Literacy level: Reading, writing, and comprehension  Barriers of Care:   Diet/ fluid restrictions Daily weights Medication costs ( applying for Medicaid)   Considerations/Referrals:   Referral made to Heart Failure Pharmacist Stewardship: Yes Referral made to Heart Failure CSW/NCM TOC: NA Referral made to Heart & Vascular TOC clinic: Yes, 08/06/2023 @ 2:45 pm  Items for  Follow-up on DC/TOC: Continued HF education Diet/ fluid restrictions/ daily weights Medication costs ( applying for Medicaid)    Rhae Hammock, BSN, RN Heart Failure Teacher, adult education Only

## 2023-07-30 NOTE — Plan of Care (Signed)

## 2023-07-30 NOTE — Evaluation (Signed)
 Physical Therapy Evaluation and Discharge Patient Details Name: Gary Conway MRN: [id number] DOB: May 28, 1976 Today's Date: 07/30/2023  History of Present Illness  Pt is a 48 y/o male presented  3/3 with Acute hypoxic respiratory failure, exertional shortness of breath orthopnea, CTA showing diffuse pulmonary groundglass opacities. PMHx:  DMII, hemangioma of spine resected with laminectomy T2-4, HTN.  Clinical Impression  Patient evaluated by Physical Therapy with no further acute PT needs identified. All education has been completed and the patient has no further questions. Is already scheduled for OP cardiopulmonary rehab. Feels close to baseline functional level. Requested mobility techs continue to work with pt while admitted to keep him functional. See below for any follow-up Physical Therapy or equipment needs. PT is signing off. Thank you for this referral.    While ambulating HR peaked at 130, SpO2 nadir at 91% on RA. Both recover within 1 min to baseline pre-activity level (HR 120s, SpO2 97% RA).            Equipment Recommendations None recommended by PT     Functional Status Assessment Patient has not had a recent decline in their functional status     Precautions / Restrictions Precautions Precautions: None Recall of Precautions/Restrictions: Intact Precaution/Restrictions Comments: Monitor O2, HR Restrictions Weight Bearing Restrictions Per Provider Order: No      Mobility  Bed Mobility Overal bed mobility: Independent                  Transfers Overall transfer level: Independent                 General transfer comment: stable, no device    Ambulation/Gait Ambulation/Gait assistance: Modified independent (Device/Increase time) Gait Distance (Feet): 150 Feet Assistive device: None Gait Pattern/deviations: Step-through pattern Gait velocity: dec Gait velocity interpretation: >2.62 ft/sec, indicative of community ambulatory   General Gait  Details: Slightly reduced gait speed, no evidence of instability or need for AD. HR peaked at 130, SpO2 nadir at 91% on RA. Both recover within 1 min to baseline pre-activity level (HR 120s, SpO2 97% RA)  Stairs            Wheelchair Mobility     Tilt Bed    Modified Rankin (Stroke Patients Only)       Balance Overall balance assessment: Modified Independent                                           Pertinent Vitals/Pain Pain Assessment Pain Assessment: No/denies pain    Home Living Family/patient expects to be discharged to:: Private residence Living Arrangements: Non-relatives/Friends Available Help at Discharge: Friend(s);Available PRN/intermittently Type of Home: Apartment Home Access: Stairs to enter   Entrance Stairs-Number of Steps: several   Home Layout: One level Home Equipment: Agricultural consultant (2 wheels) Additional Comments: Pt states all information from recent admission unchanged    Prior Function Prior Level of Function : Independent/Modified Independent             Mobility Comments: ind. Went to cardiopulmonary rehab but heart was abnormal and was rescheduled for a later date in the future. ADLs Comments: ind     Extremity/Trunk Assessment   Upper Extremity Assessment Upper Extremity Assessment: Defer to OT evaluation    Lower Extremity Assessment Lower Extremity Assessment: Overall WFL for tasks assessed       Communication   Communication Communication: No  apparent difficulties    Cognition Arousal: Alert Behavior During Therapy: WFL for tasks assessed/performed   PT - Cognitive impairments: No apparent impairments                         Following commands: Intact       Cueing Cueing Techniques: Verbal cues     General Comments General comments (skin integrity, edema, etc.): Resting HR 120s, mild dyspnea at rest, SpO2 97% on RA.    Exercises     Assessment/Plan    PT Assessment Patient  does not need any further PT services  PT Problem List         PT Treatment Interventions      PT Goals (Current goals can be found in the Care Plan section)  Acute Rehab PT Goals Patient Stated Goal: Get well, start cardiopulmonary rehab PT Goal Formulation: All assessment and education complete, DC therapy    Frequency       Co-evaluation               AM-PAC PT "6 Clicks" Mobility  Outcome Measure Help needed turning from your back to your side while in a flat bed without using bedrails?: None Help needed moving from lying on your back to sitting on the side of a flat bed without using bedrails?: None Help needed moving to and from a bed to a chair (including a wheelchair)?: None Help needed standing up from a chair using your arms (e.g., wheelchair or bedside chair)?: None Help needed to walk in hospital room?: None Help needed climbing 3-5 steps with a railing? : None 6 Click Score: 24    End of Session   Activity Tolerance: Patient tolerated treatment well Patient left: in bed;with call bell/phone within reach   PT Visit Diagnosis: Other abnormalities of gait and mobility (R26.89)    Time: 1610-9604 PT Time Calculation (min) (ACUTE ONLY): 9 min   Charges:   PT Evaluation $PT Eval Low Complexity: 1 Low   PT General Charges $$ ACUTE PT VISIT: 1 Visit         Kathlyn Sacramento, PT, DPT [facility]  Rehabilitation Services Physical Therapist Office: [phone] Website: [email or web address]   Berton Mount 07/30/2023, 10:25 AM

## 2023-07-30 NOTE — TOC Initial Note (Signed)
 Transition of Care [facility]) - Initial/Assessment Note    Patient Details  Name: Gary Conway MRN: [id number] Date of Birth: 1976/03/11  Transition of Care [facility]) CM/SW Contact:    Jessie Foot, RN Phone Number:[phone] 07/30/2023, 1:37 PM  Clinical Narrative:                 Presented for pneumonia via EMS from work. Patients mother(Gary Conway) present at bedside.PTA patient states he was independent, lives in an apartment with a friend. Transportation is provided via [employer] or his mother. Patient states he has a PCP Dulce Sellar, NP) next appointment is August 29, 2023. No HH service active. No DME.  Transportation at discharge will be his mother. Patient has requested information for assistance with finding an apartment. Case Manager will continue to follow for discharge progression.  Expected Discharge Plan: Home/Self Care Barriers to Discharge: Continued Medical Work up   Patient Goals and CMS Choice Patient states their goals for this hospitalization and ongoing recovery are:: Return home          Expected Discharge Plan and Services   Discharge Planning Services: CM Consult   Living arrangements for the past 2 months: Apartment                 DME Arranged: N/A           HH Agency: NA        Prior Living Arrangements/Services Living arrangements for the past 2 months: Apartment Lives with:: Friends Patient language and need for interpreter reviewed:: Yes Do you feel safe going back to the place where you live?: Yes      Need for Family Participation in Patient Care: No (Comment) Care giver support system in place?: No (comment)   Criminal Activity/Legal Involvement Pertinent to Current Situation/Hospitalization: No - Comment as needed  Activities of Daily Living   ADL Screening (condition at time of admission) Independently performs ADLs?: Yes (appropriate for developmental age) Is the patient deaf or have difficulty hearing?: No Does the  patient have difficulty seeing, even when wearing glasses/contacts?: No Does the patient have difficulty concentrating, remembering, or making decisions?: No  Permission Sought/Granted Permission sought to share information with : Case Manager, Family Supports Permission granted to share information with : Yes, Verbal Permission Granted  Share Information with NAME: Gary Conway     Permission granted to share info w Relationship: Mother  Permission granted to share info w Contact Information: [phone]  Emotional Assessment Appearance:: Appears stated age Attitude/Demeanor/Rapport: Engaged Affect (typically observed): Accepting, Appropriate Orientation: : Oriented to Self, Oriented to Place, Oriented to  Time, Oriented to Situation Alcohol / Substance Use: Not Applicable Psych Involvement: No (comment)  Admission diagnosis:  Hypoxia [R09.02] Mild intermittent reactive airway disease with acute exacerbation [J45.21] Multifocal pneumonia [J18.9] Community acquired pneumonia, unspecified laterality [J18.9] Patient Active Problem List   Diagnosis Date Noted   Multifocal pneumonia 07/29/2023   Chronic diastolic HF (heart failure) (HCC) 07/29/2023   Acute respiratory failure with hypoxemia (HCC) 06/17/2023   Influenza A 06/17/2023   ARDS (adult respiratory distress syndrome) (HCC) 06/17/2023   AKI (acute kidney injury) (HCC) 06/17/2023   Hemoptysis 04/05/2023   Diffuse 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   Acute respiratory failure with hypoxia (HCC) 04/02/2023   Acute hypoxemic respiratory failure (HCC) 03/25/2023   Severe sepsis (HCC) 03/22/2023   CAP (community acquired pneumonia) 03/22/2023   Diabetic ketoacidosis without coma associated with type 2 diabetes mellitus (HCC) 09/13/2019   Lactic acidosis 09/13/2019  Benign neoplasm of spinal cord (HCC) 03/27/2014   Obesity (BMI 30-39.9) 03/24/2014   DM (diabetes mellitus) (HCC) 03/24/2014   Spinal hemangioma 03/23/2014    Leukocytosis 03/23/2014   Paraplegia (HCC) 03/22/2014   Thoracic myelopathy 03/22/2014   Numbness and tingling of both legs 03/22/2014   Leg weakness, bilateral 03/22/2014   PCP:  Patient, No Pcp Per Pharmacy:   [id number] - [place] ([place] - [address] [place] [address] Phone: [phone] Fax: [phone]  [facility] [address] [place] [address] Phone: [phone] Fax: [phone]     Social Drivers of Health (SDOH) Social History: SDOH Screenings   Food Insecurity: No Food Insecurity (07/29/2023)  Housing: High Risk (07/29/2023)  Transportation Needs: No Transportation Needs (07/29/2023)  Utilities: Not At Risk (07/29/2023)  Tobacco Use: Low Risk  (07/29/2023)   SDOH Interventions:     Readmission Risk Interventions    06/24/2023    4:16 PM  Readmission Risk Prevention Plan  Transportation Screening Complete  PCP or Specialist Appt within 5-7 Days Complete  Home Care Screening Complete  Medication Review (RN CM) Referral to Pharmacy

## 2023-07-30 NOTE — Discharge Instructions (Signed)
 GET HELP ZO109 Need assistance? Not sure where to start? No matter where you live in [place], you can call 2-1-1 and a trained 2-1-1 agent will help you to find available human services resources in your community.  If you need help finding assistance with housing, food, healthcare, utility payments, and more, we can help. 2-1-1 Referral Specialists are available 24 hours a day, every day by dialing 2-1-1 or [phone] from any phone. The call is free, confidential, and available in any language.  online 2-1-1 search tool, log on [email or web address]

## 2023-07-30 NOTE — Progress Notes (Signed)
 Heart Failure Stewardship Pharmacist Progress Note   PCP: Patient, No Pcp Per PCP-Cardiologist: None    HPI:  48 y.o. male with PMHx of T2DM (history of DKA), chronic diastolic CHF, benign spinal cord injury (s/p resection in 2015), and 2 hospitalizations for ARDS requiring mechanical ventilation. Patient recently admitted from January 2025 for respiratory failure secondary to influenza.   Pt was admitted to [facility] ED on 03/03 for shortness of breath while at work. He admitted to ongoing exertional dyspnea since discharge in January 2025, but this would usually resolve with rest. This most recent episode at work did not resolve with rest, so he elected to call EMS. While en route to ED O2 sat was 88% on room air for which duonebs were given. ED was concerned for ARDS and CAP. While in ED shortness of breath improved with nebulizer, methylprednisolone 125 mg IV, and placement of 2L nasal cannula. For concern for CAP he was given Ceftriaxone 1 gram IVPB and Azithromycin 250 mg IVPB. Respiratory panel was negative. BNP elevated at 297. CXR 03/03 revealed hazy diffuse parenchymal and interstitial opacity seen throughout the left hemithorax. More mild changes on the right. CT angiogram of chest on 03/03 revealed Severely elevated right hemidiaphragm. Tiny pleural effusions. Multifocal scattered ground-glass type lung infiltrative opacities. These have a different configuration in the opacities seen previously in November 2024. No signs of pulmonary embolism. Repeat CXR 03/04 revealed significant improvement in bilateral airspace disease, with scattered airspace disease remaining with a basal gradient. Opacities less widespread compared to yesterday's reading indicating improving fluid balance/edema and/or improving pneumonia. ECHO from 03/04 revealed LVEF 55-60% (ECHO from October 2024 revealed LVEF 50-55%), no regional wall motion abnormalities of LV, unable to assess PA pressure, no evidence of mitral or  aortic stenosis, right atrial pressure of 3 mmHg.  Today patient denied shortness of breath. He did admit to some cough, but this is unchanged from yesterday. Especially when lying flat. No lower extremity edema. He insisted he had received a 90-day supply of HF medications a few months ago. [facility] and they stated he had not filled with them since 2021. No records of recent fills at any other [facility] pharmacy. He agreed to use [facility] pharmacy for discharge prescriptions and is interested in switching to a [facility] for mail-order refills. He is expecting a call from [facility] regarding his application later today or tomorrow.    Current HF Medications: Diuretic: Furosemide 40 mg IV daily Beta Blocker: Carvedilol 12.5 mg BID  Other: Isosorbide-Hydralazine 40-75 mg TID   Prior to admission HF Medications: Diuretic: Furosemide 80 mg daily (last dispensed 04/08/2023 for a 30 day-supply) Beta blocker: Carvedilol 12.5 mg BID AC (last dispensed 04/08/2023 for a 30 day supply)   Pertinent Lab Values: Serum creatinine 1.71 (1.62 03/04), BUN 23 (24 03/04), Potassium 3.2 (4.2 03/04), Sodium 134 (135 03/04) , BNP 338.9, Magnesium 2.2, A1c 6.8% (March 2025)  Vital Signs: Weight: 197.1 lbs (admission weight: 179.9 lbs) --> bed weights  Blood pressure: 100/52-141/110 (last 125/91 0842)  Heart rate: 108-125 (last was 110 at 0842) I/O: net -1.6 L yesterday; net -2.3 L since admission  Medication Assistance / Insurance Benefits Check: Does the patient have prescription insurance?  Pending Medicaid application submitted Type of insurance plan: None  Does the patient qualify for medication assistance through manufacturers or grants?   Pending  Outpatient Pharmacy:  Prior to admission outpatient pharmacy:  [facility] - [place] ([place]), [place] -  [address]   Is the patient willing to use [facility] TOC pharmacy at discharge? Yes Is the patient willing to  transition their outpatient pharmacy to utilize a [facility] outpatient pharmacy?   Yes.   Assessment: 1. Acute on chronic diastolic CHF (LVEF 55-60%), due to presumed NICM. NYHA class II symptoms. -On Lasix 40 mg IV daily. Consider transitioning to oral tomorrow. Strict I/Os and daily weights. Keep K>4 and Mg>2. -Continue Carvedilol 12.5 mg BID  -Continue BiDil 40-75 mg TID -Recommend KCl 40 mEq BID for 2 doses   Plan: 1) Medication changes recommended at this time: -Continue Lasix 40 mg IV daily. Consider transitioning to Lasix 40 mg PO BID tomorrow pending symptom improvement.  -Continue Carvedilol 12.5 mg BID -Continue BiDil 40-75 mg TID. At discharge will transition to generic Imdur and Hydralazine for affordability -Recommend KCl 40 mEq BID for 2 doses   2) Patient assistance: -Medicaid application pending   3)  Education  - To be completed prior to discharge    Sofie Rower, PharmD [employer] PGY-1

## 2023-07-30 NOTE — Inpatient Diabetes Management (Signed)
 Inpatient Diabetes Program Recommendations  AACE/ADA: New Consensus Statement on Inpatient Glycemic Control (2015)  Target Ranges:  Prepandial:   less than 140 mg/dL      Peak postprandial:   less than 180 mg/dL (1-2 hours)      Critically ill patients:  140 - 180 mg/dL   Lab Results  Component Value Date   GLUCAP 243 (H) 07/30/2023   HGBA1C 6.8 (H) 07/29/2023    Review of Glycemic Control  Latest Reference Range & Units 07/29/23 17:44 07/29/23 21:17 07/30/23 08:39  Glucose-Capillary 70 - 99 mg/dL 086 (H) 578 (H) 469 (H)   Diabetes history: DM 2 Outpatient Diabetes medications: Lantus 2 units qevening?? Listed as 35 units Daily on med rec, Humalog 0-3 units tid Current orders for Inpatient glycemic control:  Lantus 15 units Daily Novolog 0-15 units tid + hs  A1c 6.8% on 3/3 One dose of Solumedrol 125 mg given yesterday, Lantus increased from 10 units yesterday to 15 units today  Inpatient Diabetes Program Recommendations:    Pt may benefit from the addition of meal coverage insulin to prevent postprandial spikes. MD to assess during rounds.  Thanks,  Christena Deem RN, MSN, BC-ADM Inpatient Diabetes Coordinator Team Pager [phone] (8a-5p)

## 2023-07-30 NOTE — Progress Notes (Signed)
 NAME:  Gary Conway, MRN:  [id number], DOB:  [birth year], LOS: 0 ADMISSION DATE:  07/29/2023, CONSULTATION DATE:  3/3 REFERRING MD:  Stevan Born, CHIEF COMPLAINT:  recurrent respiratory failure    History of Present Illness:  48 year old male (non-smoker) w/ complicated medical hx including DM, benign spinal cord injury (s/p resection 2015), hospitalized X 2 for ARDS requiring mechanical ventilation. First time Oct 2024 w/ noted alveolitis/ DAH treated w/ steroids in addition to abx and diuresis. Just discharged 1/27 after being admitted w/ resp failure w/ ARDS requiring ventilation.  Seen in our office 2/13 still having exertional dyspnea, his CXR had cleared as of that time.  He still however noted exertional dyspnea walking into work.  He estimates he has about a mile walk from the parking lot to get to his place of work.  He works at [employer] on [address].  He does report a fair amount of dust in his workplace, but no specific packing material exposure he also reports he wears his mask regularly.  He has had a chronic cough since his discharge in January.  That cough is more active at night, exacerbated by the supine position and relieved by sit sleeping upright.  It has remained nonproductive, and has not changed in characteristic.  He has had no new fever no new chills no sick exposure no chest pain he reports a little bit of lightheadedness after exertion.  He has had no nausea no vomiting he denies reflux or heartburn.  He said no abdominal discomfort no bloating.  He does not smoke, denies vaping, denies illicit drug use.  Lives in an apartment, has no concern about his air conditioning system, has carpet but they are new and just replaced, no birds no pets no kids. On Presents to the ER 3/3 reporting that on 2/27 noted new onset of some orthopnea when he tried to lay down.  He has had no increase in lower extremity swelling however just some chronic right greater than left  trace edema but not increased.  On 3/1 developed increased worsening shortness of breath even at rest while at work and room  air pulse oximetry noted at 88% by EMS  ER eval  BNP 298, RVP neg for COVID, influenza, RSV, wbc nml, trop I neg, lactate 1.5,  Chest x-ray with bilateral infiltrates left greater than right, CT of chest showing diffuse bilateral airspace disease without significant atelectasis and only small pleural effusions.  He was admitted by the internal medicine service, started on broad-spectrum antibiotics, given a dose of Lasix, and because of the recurrence of the symptoms pulmonary was asked to evaluate Pertinent  Medical History  DAH w/ ARDS Oct 2024 requiring intubation (felt possibly hypervolemia vs inflammatory, was treated w/ steroids) recent in-patient stay 1/20 thru 1/27 for influenza A and ARDS (required intubation) benign spinal cord tumor, DM, HTN, HFpEF, CKD stage IIIa DKA Last echo 2024: EF 50-55%  Significant Hospital Events: Including procedures, antibiotic start and stop dates in addition to other pertinent events   3/3 admitted with recurrent diffuse bilateral pulmonary infiltrates  Interim History / Subjective:  Minimal change but no distress. Feels "OK".  Objective   Blood pressure (!) 141/110, pulse (!) 109, temperature 98.2 F (36.8 C), temperature source Oral, resp. rate (!) 26, height 5\' 8"  (1.727 m), weight 88.8 kg, SpO2 100%.        Intake/Output Summary (Last 24 hours) at 07/30/2023 1002 Last data filed at 07/30/2023 0840  Gross per 24 hour  Intake 250.05 ml  Output 900 ml  Net -649.95 ml   Filed Weights   07/29/23 0851 07/30/23 0500  Weight: 81.6 kg 88.8 kg    Examination: General: Pleasant 48 year old male patient sitting up in bed watching TV, in NAD HENT: [place]/AT. MMM. EOMI. Lungs: Crackles both bases, normal effort. Cardiovascular: Slightly tachy, regular, no M/R/G. Abdomen: BS x 4. Soft nontender no organomegaly. Extremities: Trace  lower extremity edema bilaterally. Neuro: A&O x 3, no deficits.   Assessment & Plan:   Recurrent dyspnea secondary to diffuse pulmonary infiltrates of unclear etiology. Hx ARDS x 2 in prior 6 months. -Does not appear to be a bacterial process, would expect his clinical exam to look much more toxic with his current CT findings Differential includes: Recurrent aspiration, he does have a history of diabetes with a nighttime cough so esophageal dysmotility certainly a consideration, pneumonitis either postviral vs viral PNA (though RVP negative, urine strep neg), autoimmune or postexposure although nothing obvious in the history.  Also consider pulmonary edema. Repeat echo 3/4 was normal. Plan Continue high dose ICS/LABA Continue empiric antibiotics Continue diuresis for goal neg balance Continue BD's F/u on esophagram to look for esophageal dysmotility F/u on urine Legionella antigen, IgA IgG and IgM We will hold off on steroids at this point He is going to need further outpatient follow-up even after resolution of acute illness  Best Practice (right click and "Reselect all SmartList Selections" daily)  Per primary    Rutherford Guys, PA - [facility] For pager details, please see AMION or use Epic chat  After 1900, please call ELINK for cross coverage needs 07/30/2023, 10:16 AM

## 2023-07-30 NOTE — Progress Notes (Signed)
  Echocardiogram 2D Echocardiogram has been performed.  Ocie Doyne RDCS 07/30/2023, 8:30 AM

## 2023-07-31 ENCOUNTER — Other Ambulatory Visit (HOSPITAL_COMMUNITY): Payer: Self-pay

## 2023-07-31 DIAGNOSIS — I5032 Chronic diastolic (congestive) heart failure: Secondary | ICD-10-CM

## 2023-07-31 DIAGNOSIS — D649 Anemia, unspecified: Secondary | ICD-10-CM

## 2023-07-31 LAB — CBC WITH DIFFERENTIAL/PLATELET
Abs Immature Granulocytes: 0.06 10*3/uL (ref 0.00–0.07)
Basophils Absolute: 0 10*3/uL (ref 0.0–0.1)
Basophils Relative: 0 %
Eosinophils Absolute: 0.2 10*3/uL (ref 0.0–0.5)
Eosinophils Relative: 3 %
HCT: 27.4 % — ABNORMAL LOW (ref 39.0–52.0)
Hemoglobin: 8.7 g/dL — ABNORMAL LOW (ref 13.0–17.0)
Immature Granulocytes: 1 %
Lymphocytes Relative: 7 %
Lymphs Abs: 0.6 10*3/uL — ABNORMAL LOW (ref 0.7–4.0)
MCH: 22.6 pg — ABNORMAL LOW (ref 26.0–34.0)
MCHC: 31.8 g/dL (ref 30.0–36.0)
MCV: 71.2 fL — ABNORMAL LOW (ref 80.0–100.0)
Monocytes Absolute: 0.6 10*3/uL (ref 0.1–1.0)
Monocytes Relative: 6 %
Neutro Abs: 7.6 10*3/uL (ref 1.7–7.7)
Neutrophils Relative %: 83 %
Platelets: 190 10*3/uL (ref 150–400)
RBC: 3.85 MIL/uL — ABNORMAL LOW (ref 4.22–5.81)
RDW: 18.4 % — ABNORMAL HIGH (ref 11.5–15.5)
WBC: 9.1 10*3/uL (ref 4.0–10.5)
nRBC: 0 % (ref 0.0–0.2)

## 2023-07-31 LAB — BASIC METABOLIC PANEL
Anion gap: 7 (ref 5–15)
BUN: 23 mg/dL — ABNORMAL HIGH (ref 6–20)
CO2: 22 mmol/L (ref 22–32)
Calcium: 8 mg/dL — ABNORMAL LOW (ref 8.9–10.3)
Chloride: 105 mmol/L (ref 98–111)
Creatinine, Ser: 1.71 mg/dL — ABNORMAL HIGH (ref 0.61–1.24)
GFR, Estimated: 48 mL/min — ABNORMAL LOW (ref >60)
Glucose, Bld: 374 mg/dL — ABNORMAL HIGH (ref 70–99)
Potassium: 3.2 mmol/L — ABNORMAL LOW (ref 3.5–5.1)
Sodium: 134 mmol/L — ABNORMAL LOW (ref 135–145)

## 2023-07-31 LAB — FOLATE: Folate: 5.9 ng/mL — ABNORMAL LOW (ref >5.9)

## 2023-07-31 LAB — GLUCOSE, CAPILLARY
Glucose-Capillary: 225 mg/dL — ABNORMAL HIGH (ref 70–99)
Glucose-Capillary: 176 mg/dL — ABNORMAL HIGH (ref 70–99)
Glucose-Capillary: 191 mg/dL — ABNORMAL HIGH (ref 70–99)
Glucose-Capillary: 241 mg/dL — ABNORMAL HIGH (ref 70–99)

## 2023-07-31 LAB — VITAMIN B12: Vitamin B-12: 183 pg/mL (ref 180–914)

## 2023-07-31 LAB — BRAIN NATRIURETIC PEPTIDE: B Natriuretic Peptide: 205.6 pg/mL — ABNORMAL HIGH (ref 0.0–100.0)

## 2023-07-31 LAB — MAGNESIUM: Magnesium: 2.2 mg/dL (ref 1.7–2.4)

## 2023-07-31 LAB — C-REACTIVE PROTEIN: CRP: 1.6 mg/dL — ABNORMAL HIGH (ref <1.0)

## 2023-07-31 MED ORDER — POTASSIUM CHLORIDE CRYS ER 20 MEQ PO TBCR
40.0000 meq | EXTENDED_RELEASE_TABLET | Freq: Four times a day (QID) | ORAL | Status: AC
  Administered 2023-07-31 (×2): 40 meq via ORAL
  Filled 2023-07-31 (×3): qty 2

## 2023-07-31 MED ORDER — CYANOCOBALAMIN 1000 MCG/ML IJ SOLN
1000.0000 ug | Freq: Every day | INTRAMUSCULAR | Status: DC
  Administered 2023-07-31 – 2023-08-02 (×3): 1000 ug via SUBCUTANEOUS
  Filled 2023-07-31 (×3): qty 1

## 2023-07-31 MED ORDER — FOLIC ACID 1 MG PO TABS
1.0000 mg | ORAL_TABLET | Freq: Every day | ORAL | Status: DC
  Administered 2023-07-31 – 2023-08-02 (×3): 1 mg via ORAL
  Filled 2023-07-31 (×3): qty 1

## 2023-07-31 MED ORDER — POTASSIUM CHLORIDE CRYS ER 20 MEQ PO TBCR
20.0000 meq | EXTENDED_RELEASE_TABLET | Freq: Once | ORAL | Status: AC
  Administered 2023-07-31: 20 meq via ORAL
  Filled 2023-07-31: qty 1

## 2023-07-31 NOTE — Progress Notes (Signed)
 NAME:  Gary Conway, MRN:  [id number], DOB:  04-21-1976, LOS: 1 ADMISSION DATE:  07/29/2023, CONSULTATION DATE:  3/3 REFERRING MD:  Stevan Born, CHIEF COMPLAINT:  recurrent respiratory failure    History of Present Illness:  48 year old male (non-smoker) w/ complicated medical hx including DM, benign spinal cord injury (s/p resection 2015), hospitalized X 2 for ARDS requiring mechanical ventilation. First time Oct 2024 w/ noted alveolitis/ DAH treated w/ steroids in addition to abx and diuresis. Just discharged 1/27 after being admitted w/ resp failure w/ ARDS requiring ventilation.  Seen in our office 2/13 still having exertional dyspnea, his CXR had cleared as of that time.  He still however noted exertional dyspnea walking into work.  He estimates he has about a mile walk from the parking lot to get to his place of work.  He works at [employer] on [address].  He does report a fair amount of dust in his workplace, but no specific packing material exposure he also reports he wears his mask regularly.  He has had a chronic cough since his discharge in January.  That cough is more active at night, exacerbated by the supine position and relieved by sit sleeping upright.  It has remained nonproductive, and has not changed in characteristic.  He has had no new fever no new chills no sick exposure no chest pain he reports a little bit of lightheadedness after exertion.  He has had no nausea no vomiting he denies reflux or heartburn.  He said no abdominal discomfort no bloating.  He does not smoke, denies vaping, denies illicit drug use.  Lives in an apartment, has no concern about his air conditioning system, has carpet but they are new and just replaced, no birds no pets no kids. On Presents to the ER 3/3 reporting that on 2/27 noted new onset of some orthopnea when he tried to lay down.  He has had no increase in lower extremity swelling however just some chronic right greater than left  trace edema but not increased.  On 3/1 developed increased worsening shortness of breath even at rest while at work and room  air pulse oximetry noted at 88% by EMS  ER eval  BNP 298, RVP neg for COVID, influenza, RSV, wbc nml, trop I neg, lactate 1.5,  Chest x-ray with bilateral infiltrates left greater than right, CT of chest showing diffuse bilateral airspace disease without significant atelectasis and only small pleural effusions.  He was admitted by the internal medicine service, started on broad-spectrum antibiotics, given a dose of Lasix, and because of the recurrence of the symptoms pulmonary was asked to evaluate Pertinent  Medical History  DAH w/ ARDS Oct 2024 requiring intubation (felt possibly hypervolemia vs inflammatory, was treated w/ steroids) recent in-patient stay 1/20 thru 1/27 for influenza A and ARDS (required intubation) benign spinal cord tumor, DM, HTN, HFpEF, CKD stage IIIa DKA Last echo 2024: EF 50-55%  Significant Hospital Events: Including procedures, antibiotic start and stop dates in addition to other pertinent events   3/3 admitted with recurrent diffuse bilateral pulmonary infiltrates  Interim History / Subjective:  On room air Still has dyspnea but improved  Tolerating inhalers Discussed lack of insurance and awaiting enrollment with medicaid  Objective   Blood pressure 107/64, pulse (!) 104, temperature 98.8 F (37.1 C), temperature source Oral, resp. rate 18, height 5\' 8"  (1.727 m), weight 89.4 kg, SpO2 98%.        Intake/Output Summary (Last  24 hours) at 07/31/2023 1430 Last data filed at 07/31/2023 1351 Gross per 24 hour  Intake 980 ml  Output 1400 ml  Net -420 ml   Filed Weights   07/29/23 0851 07/30/23 0500 07/31/23 0500  Weight: 81.6 kg 88.8 kg 89.4 kg    Physical Exam: General: Well-appearing, no acute distress HENT: [place], AT Eyes: EOMI, no scleral icterus Respiratory: Clear to auscultation bilaterally.  No crackles, wheezing or  rales Cardiovascular: RRR, -M/R/G, no JVD Extremities:-Edema,-tenderness Neuro: AAO x4, CNII-XII grossly intact Psych: Normal mood, normal affect   Assessment & Plan:   Recurrent dyspnea secondary to diffuse pulmonary infiltrates  Hx ARDs x 2 in the last 6 months requiring intubation, latter admission for influenza   Reviewed chest imaging which demonstrated improved infiltrates between hospitalizations including resolution on 07/11/23 on outpatient pulmonary visit. No history of asthma/recurrent bronchitis. May be related to post-infectious asthma increasing risk of recurrent respiratory infections with influenza contributing to inflammation. Consider aspiration risk/reflux. May benefit from maintenance bronchodilators for symptomatic relief and will need further evaluation with outpatient PFTs +/- CT Chest.    CXR this admission improved with diuresis suggestive of edema Barium study normal Echo overall unremarkable with indeterminate diastolic filling RVP neg Urine strep neg   -Continue high dose ICS/LABA. Awaiting medicaid enrollment  >If able to discharge with Dulera 200-5 mg 2 puffs BID, will need 30 day supply  >If not, please prescribe Wixela 250 mg 1 puff BID to [facility]. Patient instructed to apply for [email or web address] to apply discount -Agree gentle diuresis -Low threshold to DC abx -Check immunoglobulins -F/u urine legionella -Will need ongoing outpatient work-up including considering of high resolution CT, PFTs. Would also need referral to Pulmonary rehab once insurance approved  Follow-up scheduled for 08/21/23 with Pulmonary  Discussed plan with primary team Pulmonary available as needed.   Care Time: 75 min  Mechele Collin, M.D. [facility] 07/31/2023 4:54 PM   See Amion for personal pager For hours between 7 PM to 7 AM, please call Elink for urgent questions

## 2023-07-31 NOTE — Plan of Care (Signed)
  Problem: Coping: Goal: Ability to adjust to condition or change in health will improve Outcome: Progressing   Problem: Health Behavior/Discharge Planning: Goal: Ability to identify and utilize available resources and services will improve Outcome: Progressing Goal: Ability to manage health-related needs will improve Outcome: Progressing   Problem: Nutritional: Goal: Maintenance of adequate nutrition will improve Outcome: Progressing   Problem: Skin Integrity: Goal: Risk for impaired skin integrity will decrease Outcome: Progressing   Problem: Education: Goal: Knowledge of General Education information will improve Description: Including pain rating scale, medication(s)/side effects and non-pharmacologic comfort measures Outcome: Progressing   Problem: Clinical Measurements: Goal: Diagnostic test results will improve Outcome: Progressing Goal: Respiratory complications will improve Outcome: Progressing

## 2023-07-31 NOTE — Progress Notes (Signed)
   07/30/23 2323  Assess: MEWS Score  Temp 98.1 F (36.7 C)  BP (!) 100/52  MAP (mmHg) 66  Pulse Rate (!) 125  ECG Heart Rate (!) 126  Resp (!) 21  Level of Consciousness Alert  SpO2 95 %  O2 Device Room Air  Assess: MEWS Score  MEWS Temp 0  MEWS Systolic 1  MEWS Pulse 2  MEWS RR 1  MEWS LOC 0  MEWS Score 4  MEWS Score Color Red  Assess: if the MEWS score is Yellow or Red  Were vital signs accurate and taken at a resting state? Yes  Does the patient meet 2 or more of the SIRS criteria? No  MEWS guidelines implemented  Yes, red  Treat  MEWS Interventions Considered administering scheduled or prn medications/treatments as ordered  Take Vital Signs  Increase Vital Sign Frequency  Red: Q1hr x2, continue Q4hrs until patient remains green for 12hrs  Escalate  MEWS: Escalate Red: Discuss with charge nurse and notify provider. Consider notifying RRT. If remains red for 2 hours consider need for higher level of care  Notify: Charge Nurse/RN  Name of Charge Nurse/RN Notified Sydney,RN  Provider Notification  Provider Name/Title Dr,Mansy  Date Provider Notified 07/30/23  Time Provider Notified 2332  Method of Notification Page (secure chat)  Notification Reason Other (Comment) (HR at 120's with patient experiencing chest palpitatipn)  Provider response See new orders (250cc NS bolus,2.5mg  iv metoprolol.)  Date of Provider Response 07/30/23  Time of Provider Response 2334  Assess: SIRS CRITERIA  SIRS Temperature  0  SIRS Respirations  1  SIRS Pulse 1  SIRS WBC 0  SIRS Score Sum  2   EKG done and placed on patient chart.

## 2023-07-31 NOTE — Progress Notes (Addendum)
 PROGRESS NOTE                                                                                                                                                                                                             Patient Demographics:    Gary Conway, is a 48 y.o. male, DOB - 05-20-1976, WUJ:[id number]  Outpatient Primary MD for the patient is Dulce Sellar, NP    LOS - 1  Admit date - 07/29/2023    Chief Complaint  Patient presents with   Shortness of Breath            Brief Narrative (HPI from H&P)   48 y.o. male with medical history significant of type 2 diabetes, benign spinal cord neoplasm (s/p resection 2015), ARDS requiring mechanical ventilation x 2 presenting to the ED with shortness of breath.  Previously he also had evidence of acute on chronic diastolic CHF on his earlier admission in November, his shortness of breath this time around is exertional in nature he also has orthopnea, CTA ruled out PE but he did have bilateral groundglass opacities.  He was placed on oxygen, started on diuretics along with steroids and antibiotics, seen by [facility] and admitted by hospitalist team for shortness of breath evaluation and treatment.   Subjective:    Justice Britain today for dyspnea has improved, cough as well has improved, but still present with laying flat and activity    Assessment  & Plan :    Acute hypoxic respiratory failure, exertional shortness of breath orthopnea, CTA showing diffuse pulmonary groundglass opacities.  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  Presentation this time suspicious for CHF along with atypical infection is also  possible, he has been placed on IV steroids 1 dose given in the ER, empiric antibiotics for atypical infection along with IV diuretics, his chest x-ray has significantly improved and he has shown clinical improvement as well wondering if CHF is playing a prominent role this time around, echo is stable with preserved EF continues to have diastolic dysfunction, PCCM on board. -Patient was encouraged use incentive spirometry and flutter valve -on IV Lasix, -2.3 L  since admission we will hold Lasix today given worsening renal function -Was encouraged to use incentive spirometry and flutter valve -Continue with antibiotics -Modified barium study within normal limit. -2D echo with a preserved EF, with indeterminant diastolic filling -Retroviral panel is negative  Hypertension.  Blood pressure still quite elevated, placed on Coreg, add BiDil and monitor.  Continue diuretics.Marland Kitchen   EMEA of chronic kidney disease  B12 deficiency  Folic deficiency -Patient with baseline anemia, low iron, but elevated ferritin, discussed with patient, never had colonoscopy or endoscopy, discussed to follow-up with PCP regarding referral for colonoscopy. -Will start on IM supplement for low B12 level. -Start on folic acid replacement as well  Hypokalemia -Secondary to IV diuresis, replaced  AKI on CKD 3B.  Baseline creatinine appears to be close to 1.5.  Monitor closely with diuretics and recent IV dye for CTA.   -Creatinine is up to 1.7 today, will hold Lasix  DM Type II.  On Lantus and sliding scale monitor and adjust.  If pulmonary places him on steroids on a scheduled basis then will require further adjustment.  Lab Results  Component Value Date   HGBA1C 6.8 (H) 07/29/2023   CBG (last 3)  Recent Labs    07/30/23 2134 07/31/23 0748 07/31/23 1119  GLUCAP 178* 225* 176*        Condition - Fair  Family Communication  :  None  Code Status :  Full  Consults  :  PCCM  PUD Prophylaxis :    Procedures   :     CTA  Breathing motion.  No segmental or larger pulmonary embolism. Severely elevated right hemidiaphragm. Tiny pleural effusions. Multifocal scattered ground-glass type lung infiltrative opacities. These have a different configuration in the opacities seen previously in November 2024. There are several enlarged mediastinal lymph nodes as well which are also increased from that examination. Please correlate with clinical presentation recommend short follow-up or additional workup as clinically directed.  TTE - 1. Left ventricular ejection fraction, by estimation, is 55 to 60%. The left ventricle has normal function. The left ventricle has no regional wall motion abnormalities. Indeterminate diastolic filling due to E-A fusion.  2. Right ventricular systolic function is normal. The right ventricular size is normal. Tricuspid regurgitation signal is inadequate for assessing PA pressure.  3. The mitral valve is grossly normal. Trivial mitral valve regurgitation. No evidence of mitral stenosis.  4. The aortic valve is tricuspid. Aortic valve regurgitation is not visualized. No aortic stenosis is present.  5. The inferior vena cava is normal in size with greater than 50% respiratory variability, suggesting right atrial pressure of 3 mmHg.      Disposition Plan  :    Status is: Observation   DVT Prophylaxis  :    enoxaparin (LOVENOX) injection 40 mg Start: 07/29/23 1630  Lab Results  Component Value Date   PLT 190 07/31/2023    Diet :  Diet Order             Diet Carb Modified Fluid consistency: Thin; Room service appropriate? Yes  Diet effective now                    Inpatient Medications  Scheduled Meds:  carvedilol  12.5 mg Oral BID WC   cyanocobalamin  1,000 mcg Subcutaneous Daily   enoxaparin (LOVENOX) injection  40 mg Subcutaneous Q24H   folic acid  1 mg Oral Daily   furosemide  40 mg Intravenous Daily   insulin  aspart  0-15 Units Subcutaneous TID WC   insulin aspart   0-5 Units Subcutaneous QHS   insulin glargine  15 Units Subcutaneous Daily   isosorbide-hydrALAZINE  2 tablet Oral TID   mometasone-formoterol  2 puff Inhalation BID   Continuous Infusions:  azithromycin 500 mg (07/30/23 1659)   cefTRIAXone (ROCEPHIN)  IV 2 g (07/30/23 1608)   PRN Meds:.acetaminophen **OR** acetaminophen, alum & mag hydroxide-simeth  Antibiotics  :    Anti-infectives (From admission, onward)    Start     Dose/Rate Route Frequency Ordered Stop   07/30/23 1600  cefTRIAXone (ROCEPHIN) 2 g in sodium chloride 0.9 % 100 mL IVPB        2 g 200 mL/hr over 30 Minutes Intravenous Every 24 hours 07/29/23 1849     07/30/23 1600  azithromycin (ZITHROMAX) 500 mg in sodium chloride 0.9 % 250 mL IVPB        500 mg 250 mL/hr over 60 Minutes Intravenous Every 24 hours 07/29/23 1849     07/29/23 1615  cefTRIAXone (ROCEPHIN) 1 g in sodium chloride 0.9 % 100 mL IVPB        1 g 200 mL/hr over 30 Minutes Intravenous  Once 07/29/23 1606 07/29/23 1708   07/29/23 1615  azithromycin (ZITHROMAX) 500 mg in sodium chloride 0.9 % 250 mL IVPB        500 mg 250 mL/hr over 60 Minutes Intravenous  Once 07/29/23 1606 07/29/23 1811         Objective:   Vitals:   07/31/23 0800 07/31/23 0836 07/31/23 0842 07/31/23 1215  BP: (!) 125/91  (!) 125/91 107/64  Pulse: (!) 110  (!) 110 (!) 104  Resp: (!) 28 20  18   Temp: 99 F (37.2 C)   98.8 F (37.1 C)  TempSrc: Oral   Oral  SpO2: 94%   98%  Weight:      Height:        Wt Readings from Last 3 Encounters:  07/31/23 89.4 kg  07/11/23 83.6 kg  06/24/23 84 kg     Intake/Output Summary (Last 24 hours) at 07/31/2023 1230 Last data filed at 07/31/2023 0900 Gross per 24 hour  Intake 740 ml  Output 1400 ml  Net -660 ml     Physical Exam  Awake Alert, Oriented X 3, No new F.N deficits, Normal affect Symmetrical Chest wall movement, Good air movement bilaterally, tattered Rales bilaterally RRR,No Gallops,Rubs or new Murmurs, No Parasternal  Heave +ve B.Sounds, Abd Soft, No tenderness, No rebound - guarding or rigidity. No Cyanosis, Clubbing or edema, No new Rash or bruise       Data Review:    Recent Labs  Lab 07/29/23 0924 07/30/23 0450 07/31/23 0512  WBC 10.0 13.3* 9.1  HGB 11.0* 9.6* 8.7*  HCT 35.0* 30.0* 27.4*  PLT 227 231 190  MCV 71.6* 70.8* 71.2*  MCH 22.5* 22.6* 22.6*  MCHC 31.4 32.0 31.8  RDW 17.5* 17.7* 18.4*  LYMPHSABS 0.8  --  0.6*  MONOABS 0.4  --  0.6  EOSABS 0.1  --  0.2  BASOSABS 0.0  --  0.0    Recent Labs  Lab 07/29/23 0857 07/29/23 0924 07/29/23 0943 07/29/23 1138 07/29/23 1824 07/29/23 2058 07/30/23 0450 07/31/23 0512  NA  --  137  --   --   --   --  135 134*  K  --  4.3  --   --   --   --  4.2 3.2*  CL  --  105  --   --   --   --  103 105  CO2  --  19*  --   --   --   --  23 22  ANIONGAP  --  13  --   --   --   --  9 7  GLUCOSE  --  193*  --   --   --   --  272* 374*  BUN  --  10  --   --   --   --  24* 23*  CREATININE  --  1.19  --   --   --   --  1.62* 1.71*  CRP  --   --   --   --  0.9  --   --  1.6*  PROCALCITON  --   --   --   --  <0.10  --  0.10  --   LATICACIDVEN  --   --  1.5 1.1  --   --   --   --   HGBA1C  --   --   --   --   --  6.8*  --   --   BNP 297.9*  --   --   --   --   --  338.9* 205.6*  MG  --   --   --   --   --   --   --  2.2  CALCIUM  --  8.9  --   --   --   --  8.6* 8.0*      Recent Labs  Lab 07/29/23 0857 07/29/23 0924 07/29/23 0943 07/29/23 1138 07/29/23 1824 07/29/23 2058 07/30/23 0450 07/31/23 0512  CRP  --   --   --   --  0.9  --   --  1.6*  PROCALCITON  --   --   --   --  <0.10  --  0.10  --   LATICACIDVEN  --   --  1.5 1.1  --   --   --   --   HGBA1C  --   --   --   --   --  6.8*  --   --   BNP 297.9*  --   --   --   --   --  338.9* 205.6*  MG  --   --   --   --   --   --   --  2.2  CALCIUM  --  8.9  --   --   --   --  8.6* 8.0*     --------------------------------------------------------------------------------------------------------------- Lab Results  Component Value Date   TRIG 229 (H) 03/25/2023    Lab Results  Component Value Date   HGBA1C 6.8 (H) 07/29/2023   No results for input(s): "TSH", "T4TOTAL", "FREET4", "T3FREE", "THYROIDAB" in the last 72 hours. Recent Labs    07/31/23 0512  VITAMINB12 183  FOLATE 5.9*   ------------------------------------------------------------------------------------------------------------------ Cardiac Enzymes No results for input(s): "CKMB", "TROPONINI", "MYOGLOBIN" in the last 168 hours.  Invalid input(s): "CK"  Micro Results Recent Results (from the past 240 hours)  Resp panel by RT-PCR (RSV, Flu A&B, Covid) Anterior Nasal Swab     Status: None   Collection Time: 07/29/23  8:58 AM   Specimen: Anterior Nasal Swab  Result Value Ref Range Status   SARS Coronavirus 2 by RT PCR NEGATIVE NEGATIVE Final   Influenza A by PCR NEGATIVE  NEGATIVE Final   Influenza B by PCR NEGATIVE NEGATIVE Final    Comment: (NOTE) The Xpert Xpress SARS-CoV-2/FLU/RSV plus assay is intended as an aid in the diagnosis of influenza from Nasopharyngeal swab specimens and should not be used as a sole basis for treatment. Nasal washings and aspirates are unacceptable for Xpert Xpress SARS-CoV-2/FLU/RSV testing.  Fact Sheet for Patients: BloggerCourse.com  Fact Sheet for Healthcare Providers: SeriousBroker.it  This test is not yet approved or cleared by the [place] FDA and has been authorized for detection and/or diagnosis of SARS-CoV-2 by FDA under an Emergency Use Authorization (EUA). This EUA will remain in effect (meaning this test can be used) for the duration of the COVID-19 declaration under Section 564(b)(1) of the Act, 21 U.S.C. section 360bbb-3(b)(1), unless the authorization is terminated or revoked.     Resp  Syncytial Virus by PCR NEGATIVE NEGATIVE Final    Comment: (NOTE) Fact Sheet for Patients: BloggerCourse.com  Fact Sheet for Healthcare Providers: SeriousBroker.it  This test is not yet approved or cleared by the [place] FDA and has been authorized for detection and/or diagnosis of SARS-CoV-2 by FDA under an Emergency Use Authorization (EUA). This EUA will remain in effect (meaning this test can be used) for the duration of the COVID-19 declaration under Section 564(b)(1) of the Act, 21 U.S.C. section 360bbb-3(b)(1), unless the authorization is terminated or revoked.  Performed at [facility], [address] [place], [place] [address]   Culture, blood (routine x 2)     Status: None (Preliminary result)   Collection Time: 07/29/23  9:24 AM   Specimen: BLOOD RIGHT HAND  Result Value Ref Range Status   Specimen Description BLOOD RIGHT HAND  Final   Special Requests   Final    BOTTLES DRAWN AEROBIC ONLY Blood Culture results may not be optimal due to an inadequate volume of blood received in culture bottles   Culture   Final    NO GROWTH 2 DAYS Performed at [facility], [address] [place], [place] [address]    Report Status PENDING  Incomplete  Culture, blood (routine x 2)     Status: None (Preliminary result)   Collection Time: 07/29/23  9:34 AM   Specimen: BLOOD LEFT HAND  Result Value Ref Range Status   Specimen Description BLOOD LEFT HAND  Final   Special Requests   Final    BOTTLES DRAWN AEROBIC AND ANAEROBIC Blood Culture results may not be optimal due to an inadequate volume of blood received in culture bottles   Culture   Final    NO GROWTH 2 DAYS Performed at [facility], [address], [place], [place] [address]    Report Status PENDING  Incomplete  Respiratory (~20 pathogens) panel by PCR     Status: None   Collection Time: 07/29/23  6:13 PM   Specimen: Nasopharyngeal Swab;  Respiratory  Result Value Ref Range Status   Adenovirus NOT DETECTED NOT DETECTED Final   Coronavirus 229E NOT DETECTED NOT DETECTED Final    Comment: (NOTE) The Coronavirus on the Respiratory Panel, DOES NOT test for the novel  Coronavirus (2019 nCoV)    Coronavirus HKU1 NOT DETECTED NOT DETECTED Final   Coronavirus NL63 NOT DETECTED NOT DETECTED Final   Coronavirus OC43 NOT DETECTED NOT DETECTED Final   Metapneumovirus NOT DETECTED NOT DETECTED Final   Rhinovirus / Enterovirus NOT DETECTED NOT DETECTED Final   Influenza A NOT DETECTED NOT DETECTED Final  Influenza B NOT DETECTED NOT DETECTED Final   Parainfluenza Virus 1 NOT DETECTED NOT DETECTED Final   Parainfluenza Virus 2 NOT DETECTED NOT DETECTED Final   Parainfluenza Virus 3 NOT DETECTED NOT DETECTED Final   Parainfluenza Virus 4 NOT DETECTED NOT DETECTED Final   Respiratory Syncytial Virus NOT DETECTED NOT DETECTED Final   Bordetella pertussis NOT DETECTED NOT DETECTED Final   Bordetella Parapertussis NOT DETECTED NOT DETECTED Final   Chlamydophila pneumoniae NOT DETECTED NOT DETECTED Final   Mycoplasma pneumoniae NOT DETECTED NOT DETECTED Final    Comment: Performed at [facility], [address] [place], [place] [address]    Radiology Report DG ESOPHAGUS W DOUBLE CM (HD) Result Date: 07/30/2023 CLINICAL DATA:  Patient with a history of recurrent dyspnea secondary to diffuse pulmonary infiltrates of unclear etiology. EXAM: ESOPHAGUS/BARIUM SWALLOW/TABLET STUDY TECHNIQUE: Combined double and single contrast examination was performed using effervescent crystals, high-density barium, and thin liquid barium. This exam was performed by Alwyn Ren NP, and was supervised and interpreted by Dr. Loreta Ave. FLUOROSCOPY: Radiation Exposure Index (as provided by the fluoroscopic device): 26.60 mGy Kerma COMPARISON:  None Available. FINDINGS: Swallowing: Appears normal. No vestibular penetration or aspiration seen. Pharynx:  Unremarkable. Esophagus: Normal appearance. Esophageal motility: Within normal limits. Hiatal Hernia: None. Gastroesophageal reflux: None visualized. Ingested 13mm barium tablet: Passed normally. Other: None. IMPRESSION: Normal barium study. Electronically Signed   By: Gilmer Mor D.O.   On: 07/30/2023 13:55   ECHOCARDIOGRAM COMPLETE Result Date: 07/30/2023    ECHOCARDIOGRAM REPORT   Patient Name:   RAEQUAN VANSCHAICK Date of Exam: 07/30/2023 Medical Rec #:  [id number]        Height:       68.0 in Accession #:    [id number]       Weight:       195.8 lb Date of Birth:  03-11-1976        BSA:          2.025 m Patient Age:    47 years         BP:           141/110 mmHg Patient Gender: M                HR:           106 bpm. Exam Location:  Inpatient Procedure: 2D Echo, Cardiac Doppler, Color Doppler and Intracardiac            Opacification Agent (Both Spectral and Color Flow Doppler were            utilized during procedure). Indications:    Dyspnea  History:        Patient has prior history of Echocardiogram examinations, most                 recent 03/25/2023. Risk Factors:Hypertension. Shock.  Sonographer:    Vern Claude Referring Phys: [id number] PETER E BABCOCK IMPRESSIONS  1. Left ventricular ejection fraction, by estimation, is 55 to 60%. The left ventricle has normal function. The left ventricle has no regional wall motion abnormalities. Indeterminate diastolic filling due to E-A fusion.  2. Right ventricular systolic function is normal. The right ventricular size is normal. Tricuspid regurgitation signal is inadequate for assessing PA pressure.  3. The mitral valve is grossly normal. Trivial mitral valve regurgitation. No evidence of mitral stenosis.  4. The aortic valve is tricuspid. Aortic valve regurgitation is not visualized. No aortic stenosis is present.  5. The inferior  vena cava is normal in size with greater than 50% respiratory variability, suggesting right atrial pressure of 3 mmHg. FINDINGS  Left  Ventricle: Left ventricular ejection fraction, by estimation, is 55 to 60%. The left ventricle has normal function. The left ventricle has no regional wall motion abnormalities. Definity contrast agent was given IV to delineate the left ventricular  endocardial borders. The left ventricular internal cavity size was normal in size. There is no left ventricular hypertrophy. Indeterminate diastolic filling due to E-A fusion. Right Ventricle: The right ventricular size is normal. No increase in right ventricular wall thickness. Right ventricular systolic function is normal. Tricuspid regurgitation signal is inadequate for assessing PA pressure. Left Atrium: Left atrial size was normal in size. Right Atrium: Right atrial size was normal in size. Pericardium: Trivial pericardial effusion is present. Mitral Valve: The mitral valve is grossly normal. Trivial mitral valve regurgitation. No evidence of mitral valve stenosis. MV peak gradient, 8.3 mmHg. The mean mitral valve gradient is 3.0 mmHg. Tricuspid Valve: The tricuspid valve is grossly normal. Tricuspid valve regurgitation is trivial. No evidence of tricuspid stenosis. Aortic Valve: The aortic valve is tricuspid. Aortic valve regurgitation is not visualized. No aortic stenosis is present. Pulmonic Valve: The pulmonic valve was grossly normal. Pulmonic valve regurgitation is not visualized. No evidence of pulmonic stenosis. Aorta: The aortic root and ascending aorta are structurally normal, with no evidence of dilitation. Venous: The inferior vena cava is normal in size with greater than 50% respiratory variability, suggesting right atrial pressure of 3 mmHg. IAS/Shunts: The atrial septum is grossly normal.  LEFT VENTRICLE PLAX 2D LVIDd:         4.60 cm      Diastology LVIDs:         3.10 cm      LV e' medial:    9.68 cm/s LV PW:         1.10 cm      LV E/e' medial:  12.8 LV IVS:        0.80 cm      LV e' lateral:   8.70 cm/s LVOT diam:     1.80 cm      LV E/e' lateral:  14.3 LV SV:         43 LV SV Index:   21 LVOT Area:     2.54 cm  LV Volumes (MOD) LV vol d, MOD A2C: 98.5 ml LV vol d, MOD A4C: 125.0 ml LV vol s, MOD A2C: 35.8 ml LV vol s, MOD A4C: 45.3 ml LV SV MOD A2C:     62.7 ml LV SV MOD A4C:     125.0 ml LV SV MOD BP:      68.9 ml RIGHT VENTRICLE             IVC RV Basal diam:  3.50 cm     IVC diam: 1.20 cm RV S prime:     12.30 cm/s LEFT ATRIUM           Index        RIGHT ATRIUM           Index LA diam:      3.50 cm 1.73 cm/m   RA Area:     12.80 cm LA Vol (A2C): 42.4 ml 20.94 ml/m  RA Volume:   28.90 ml  14.27 ml/m LA Vol (A4C): 48.0 ml 23.70 ml/m  AORTIC VALVE             PULMONIC VALVE  LVOT Vmax:   103.05 cm/s PV Vmax:       0.91 m/s LVOT Vmean:  67.050 cm/s PV Peak grad:  3.3 mmHg LVOT VTI:    0.170 m  AORTA Ao Root diam: 3.10 cm Ao Asc diam:  2.70 cm MITRAL VALVE MV Area (PHT): 7.02 cm     SHUNTS MV Area VTI:   1.82 cm     Systemic VTI:  0.17 m MV Peak grad:  8.3 mmHg     Systemic Diam: 1.80 cm MV Mean grad:  3.0 mmHg MV Vmax:       1.44 m/s MV Vmean:      82.3 cm/s MV Decel Time: 108 msec MV E velocity: 124.00 cm/s Lennie Odor MD Electronically signed by Lennie Odor MD Signature Date/Time: 07/30/2023/8:38:57 AM    Final    DG Chest Port 1 View Result Date: 07/30/2023 CLINICAL DATA:  [id number] with shortness of breath.  No chest pain. EXAM: PORTABLE CHEST 1 VIEW COMPARISON:  Portable chest yesterday at 9:07 p.m., CTA chest yesterday at 1:54 a.m. FINDINGS: 6:30 a.m. There is significant improvement in bilateral airspace disease, with scattered airspace disease remaining with a basal gradient. The opacities are considerably less widespread and confluent today consistent with improving fluid balance/edema and/or improving pneumonia. Small pleural effusions appear similar as well as elevated right hemidiaphragm. The mediastinum is stable. The cardiac size is normal. No new osseous findings. IMPRESSION: 1. Significant improvement in bilateral airspace disease,  with scattered airspace disease remaining with a basal gradient. 2. The opacities are considerably less widespread and confluent today consistent with improving fluid balance/edema and/or improving pneumonia. 3. Small pleural effusions appear similar. Electronically Signed   By: Almira Bar M.D.   On: 07/30/2023 07:25   CT Angio Chest PE W and/or Wo Contrast Result Date: 07/29/2023 CLINICAL DATA:  Shortness of breath EXAM: CT ANGIOGRAPHY CHEST WITH CONTRAST TECHNIQUE: Multidetector CT imaging of the chest was performed using the standard protocol during bolus administration of intravenous contrast. Multiplanar CT image reconstructions and MIPs were obtained to evaluate the vascular anatomy. RADIATION DOSE REDUCTION: This exam was performed according to the departmental dose-optimization program which includes automated exposure control, adjustment of the mA and/or kV according to patient size and/or use of iterative reconstruction technique. CONTRAST:  75mL OMNIPAQUE IOHEXOL 350 MG/ML SOLN COMPARISON:  Chest x-ray 07/29/2023 and older. CT without contrast 03/31/2023 FINDINGS: Cardiovascular: Heart is nonenlarged. Thoracic aorta is normal course and caliber. Trace pericardial fluid. There is some breathing motion identified throughout the examination. This limits evaluation including nondiagnostic for small and peripheral emboli. No segmental or larger pulmonary embolism identified. Mediastinum/Nodes: Slightly patulous thoracic esophagus. Thyroid gland is unremarkable. Prominent bilateral axillary nodes are seen, less than a cm in short axis. There is some prominent hilar nodes. There are a few enlarged mediastinal nodes identified. Example to the left of the carina measures 16 x 23 mm on series 5, image 46. Focus left mediastinum at the level of the aortic arch measures 21 x 12 mm. Subcarinal node has short axis dimension of 17 mm on series 5, image 61. These are all increased from the prior CT scan of November  2024. Lungs/Pleura: Tiny bilateral pleural effusions identified. There is scattered patchy ground-glass scattered in both lungs. Infectious, inflammatory etiologies are in the differential. No pneumothorax. Diffuse breathing motion. The opacities have a different configuration than that of November 2024. On that exam these are more consolidative areas of opacity rather than patchy ground-glass. Upper Abdomen:  Adrenal glands are preserved in the upper abdomen. Malrotated, congenital kidneys. Musculoskeletal: Scattered degenerative changes of the spine. There is extensive laminectomies in the upper thoracic spine with sclerotic heterogeneous appearance to the T3 vertebral body, unchanged from previous. Please correlate with history. Patient's history of a spinal cord angioma severely elevated right hemidiaphragm. Review of the MIP images confirms the above findings. IMPRESSION: Breathing motion.  No segmental or larger pulmonary embolism. Severely elevated right hemidiaphragm. Tiny pleural effusions. Multifocal scattered ground-glass type lung infiltrative opacities. These have a different configuration in the opacities seen previously in November 2024. There are several enlarged mediastinal lymph nodes as well which are also increased from that examination. Please correlate with clinical presentation recommend short follow-up or additional workup as clinically directed. Electronically Signed   By: Karen Kays M.D.   On: 07/29/2023 16:00     Signature  -   Huey Bienenstock M.D on 07/31/2023 at 12:30 PM   -  To page go to [email or web address]

## 2023-08-01 LAB — CBC WITH DIFFERENTIAL/PLATELET
Abs Immature Granulocytes: 0.03 10*3/uL (ref 0.00–0.07)
Basophils Absolute: 0 10*3/uL (ref 0.0–0.1)
Basophils Relative: 0 %
Eosinophils Absolute: 0.3 10*3/uL (ref 0.0–0.5)
Eosinophils Relative: 4 %
HCT: 30.7 % — ABNORMAL LOW (ref 39.0–52.0)
Hemoglobin: 9.5 g/dL — ABNORMAL LOW (ref 13.0–17.0)
Immature Granulocytes: 0 %
Lymphocytes Relative: 9 %
Lymphs Abs: 0.7 10*3/uL (ref 0.7–4.0)
MCH: 22.6 pg — ABNORMAL LOW (ref 26.0–34.0)
MCHC: 30.9 g/dL (ref 30.0–36.0)
MCV: 73.1 fL — ABNORMAL LOW (ref 80.0–100.0)
Monocytes Absolute: 0.8 10*3/uL (ref 0.1–1.0)
Monocytes Relative: 10 %
Neutro Abs: 5.6 10*3/uL (ref 1.7–7.7)
Neutrophils Relative %: 77 %
Platelets: 191 10*3/uL (ref 150–400)
RBC: 4.2 MIL/uL — ABNORMAL LOW (ref 4.22–5.81)
RDW: 18.4 % — ABNORMAL HIGH (ref 11.5–15.5)
WBC: 7.4 10*3/uL (ref 4.0–10.5)
nRBC: 0 % (ref 0.0–0.2)

## 2023-08-01 LAB — IGM: IgM (Immunoglobulin M), Srm: 88 mg/dL (ref 20–172)

## 2023-08-01 LAB — IGG: IgG (Immunoglobin G), Serum: 1041 mg/dL (ref 603–1613)

## 2023-08-01 LAB — GLUCOSE, CAPILLARY
Glucose-Capillary: 154 mg/dL — ABNORMAL HIGH (ref 70–99)
Glucose-Capillary: 195 mg/dL — ABNORMAL HIGH (ref 70–99)
Glucose-Capillary: 219 mg/dL — ABNORMAL HIGH (ref 70–99)
Glucose-Capillary: 225 mg/dL — ABNORMAL HIGH (ref 70–99)

## 2023-08-01 LAB — C-REACTIVE PROTEIN: CRP: 4 mg/dL — ABNORMAL HIGH (ref <1.0)

## 2023-08-01 LAB — BASIC METABOLIC PANEL
Anion gap: 9 (ref 5–15)
BUN: 17 mg/dL (ref 6–20)
CO2: 23 mmol/L (ref 22–32)
Calcium: 8.5 mg/dL — ABNORMAL LOW (ref 8.9–10.3)
Chloride: 108 mmol/L (ref 98–111)
Creatinine, Ser: 1.31 mg/dL — ABNORMAL HIGH (ref 0.61–1.24)
GFR, Estimated: >60 mL/min (ref >60)
Glucose, Bld: 200 mg/dL — ABNORMAL HIGH (ref 70–99)
Potassium: 3.8 mmol/L (ref 3.5–5.1)
Sodium: 140 mmol/L (ref 135–145)

## 2023-08-01 LAB — PROCALCITONIN: Procalcitonin: 0.13 ng/mL

## 2023-08-01 LAB — BRAIN NATRIURETIC PEPTIDE: B Natriuretic Peptide: 305.8 pg/mL — ABNORMAL HIGH (ref 0.0–100.0)

## 2023-08-01 LAB — LEGIONELLA PNEUMOPHILA SEROGP 1 UR AG: L. pneumophila Serogp 1 Ur Ag: POSITIVE — AB

## 2023-08-01 LAB — MAGNESIUM: Magnesium: 2.1 mg/dL (ref 1.7–2.4)

## 2023-08-01 LAB — IGA: IgA: 150 mg/dL (ref 90–386)

## 2023-08-01 MED ORDER — AZITHROMYCIN 500 MG PO TABS
500.0000 mg | ORAL_TABLET | Freq: Every day | ORAL | Status: DC
  Administered 2023-08-01 – 2023-08-02 (×2): 500 mg via ORAL
  Filled 2023-08-01 (×2): qty 1

## 2023-08-01 MED ORDER — CARVEDILOL 25 MG PO TABS
25.0000 mg | ORAL_TABLET | Freq: Two times a day (BID) | ORAL | Status: DC
  Administered 2023-08-01 – 2023-08-02 (×2): 25 mg via ORAL
  Filled 2023-08-01 (×2): qty 1

## 2023-08-01 MED ORDER — INSULIN GLARGINE 100 UNIT/ML ~~LOC~~ SOLN
25.0000 [IU] | Freq: Every day | SUBCUTANEOUS | Status: DC
  Administered 2023-08-01 – 2023-08-02 (×2): 25 [IU] via SUBCUTANEOUS
  Filled 2023-08-01 (×2): qty 0.25

## 2023-08-01 MED ORDER — FUROSEMIDE 40 MG PO TABS
40.0000 mg | ORAL_TABLET | Freq: Every day | ORAL | Status: DC
  Administered 2023-08-01 – 2023-08-02 (×2): 40 mg via ORAL
  Filled 2023-08-01 (×2): qty 1

## 2023-08-01 MED ORDER — ISOSORB DINITRATE-HYDRALAZINE 20-37.5 MG PO TABS
1.0000 | ORAL_TABLET | Freq: Three times a day (TID) | ORAL | Status: DC
  Administered 2023-08-01 – 2023-08-02 (×3): 1 via ORAL
  Filled 2023-08-01 (×3): qty 1

## 2023-08-01 NOTE — Progress Notes (Signed)
 Heart Failure Stewardship Pharmacist Progress Note   PCP: Dulce Sellar, NP PCP-Cardiologist: None    HPI:  48 y.o. male with PMHx of T2DM (history of DKA), chronic diastolic CHF, benign spinal cord injury (s/p resection in 2015), and 2 hospitalizations for ARDS requiring mechanical ventilation. Patient recently admitted from January 2025 for respiratory failure secondary to influenza.   Pt was admitted to [facility] ED on 03/03 for shortness of breath while at work. He admitted to ongoing exertional dyspnea since discharge in January 2025, but this would usually resolve with rest. This most recent episode at work did not resolve with rest, so he elected to call EMS. While en route to ED O2 sat was 88% on room air for which duonebs were given. ED was concerned for ARDS and CAP. While in ED shortness of breath improved with nebulizer, methylprednisolone 125 mg IV, and placement of 2L nasal cannula. For concern for CAP he was given Ceftriaxone 1 gram IVPB and Azithromycin 250 mg IVPB. Respiratory panel was negative. BNP elevated at 297. CXR 03/03 revealed hazy diffuse parenchymal and interstitial opacity seen throughout the left hemithorax. More mild changes on the right. CT angiogram of chest on 03/03 revealed Severely elevated right hemidiaphragm. Tiny pleural effusions. Multifocal scattered ground-glass type lung infiltrative opacities. These have a different configuration in the opacities seen previously in November 2024. No signs of pulmonary embolism. Repeat CXR 03/04 revealed significant improvement in bilateral airspace disease, with scattered airspace disease remaining with a basal gradient. Opacities less widespread compared to yesterday's reading indicating improving fluid balance/edema and/or improving pneumonia. ECHO from 03/04 revealed LVEF 55-60% (ECHO from October 2024 revealed LVEF 50-55%), no regional wall motion abnormalities of LV, unable to assess PA pressure, no evidence of mitral or  aortic stenosis, right atrial pressure of 3 mmHg.  Furosemide 40 mg IV daily stopped 03/05 for concern for AKI. Scr today decreased at 1.31. Today he reported no shortness of breath, cough or lower extremity edema. He did admit to orthopnea. He was unable to lay flat to sleep last night. He has not yet heard a response from medicaid.   Current HF Medications: Beta Blocker: Carvedilol 12.5 mg BID  Other: Isosorbide-Hydralazine 40-75 mg TID   Prior to admission HF Medications: Diuretic: Furosemide 80 mg daily (last dispensed 04/08/2023 for a 30 day-supply) Beta blocker: Carvedilol 12.5 mg BID AC (last dispensed 04/08/2023 for a 30 day supply)   Pertinent Lab Values: Serum creatinine 1.31 (1.71 03/05), BUN 17 (23 03/05), Potassium 3.8 (3.6 03/05), Sodium 140 (134 03/05) , BNP  205.6 (338.9 at admission), Magnesium 2.1 (2.2 03/05), A1c 6.8% (March 2025)  Vital Signs: Weight: 197.1 lbs (admission weight: 179.9 lbs) --> bed weights; no weight from 03/06 Blood pressure: 95/57-134/96 (last 135/97 at 0800)  Heart rate: 104-125 (last was 118 at 0800) I/O: net -0.58 L yesterday; net -2.6 L since admission  Medication Assistance / Insurance Benefits Check: Does the patient have prescription insurance?  Pending Medicaid application submitted Type of insurance plan: None  Does the patient qualify for medication assistance through manufacturers or grants?   Pending  Outpatient Pharmacy:  Prior to admission outpatient pharmacy:  [facility] - [place] ([place]), [place] - [address]   Is the patient willing to use [facility] pharmacy at discharge? Yes Is the patient willing to transition their outpatient pharmacy to utilize a [facility] outpatient pharmacy?   Yes.   Assessment: 1. Acute on chronic diastolic CHF (LVEF 55-60%), due  to presumed NICM. NYHA class II symptoms. - Lasix 40 mg IV daily stopped for AKI. Transitioned to PO diuretics. Strict I/Os and daily weights. Keep K>4  and Mg>2. -Consider increasing Carvedilol to 25 mg BID for persistent tachycardia and due to patient being more euvolemic.  -Consider Spironolactone 12.5 mg daily -Continue BiDil 40-75 mg TID   Plan: 1) Medication changes recommended at this time: -Transitioned to oral lasix 40 mg every day  -Consider increasing Carvedilol to 25 mg BID  -Continue BiDil 40-75 mg TID. At discharge will transition to generic Imdur and Hydralazine for affordability  2) Patient assistance: -Medicaid application pending   3)  Education  - To be completed prior to discharge    Sofie Rower, PharmD [employer] PGY-1

## 2023-08-01 NOTE — Progress Notes (Signed)
   08/01/23 1404  Mobility  Activity Ambulated independently in hallway  Level of Assistance Standby assist, set-up cues, supervision of patient - no hands on  Assistive Device None  Distance Ambulated (ft) 300 ft  Activity Response Tolerated fair  Mobility Referral Yes  Mobility visit 1 Mobility  Mobility Specialist Start Time (ACUTE ONLY) 1349  Mobility Specialist Stop Time (ACUTE ONLY) 1404  Mobility Specialist Time Calculation (min) (ACUTE ONLY) 15 min   Mobility Specialist: Progress Note  Pre-Mobility:      HR 104, SpO2 94% RA During Mobility: HR 120-134, SpO2 93-96% RA Post-Mobility:    HR 114, SpO2 97% RA  Pt agreeable to mobility session - received in bed. C/o feeling winded with exertion. Returned to EOB with all needs met - call bell within reach.   Barnie Mort, BS Mobility Specialist Please contact via SecureChat or  Rehab office at [phone].

## 2023-08-01 NOTE — Plan of Care (Signed)

## 2023-08-01 NOTE — Progress Notes (Addendum)
 PROGRESS NOTE                                                                                                                                                                                                             Patient Demographics:    Gary Conway, is a 48 y.o. male, DOB - May 28, 1976, ZOX:[id number]  Outpatient Primary MD for the patient is Dulce Sellar, NP    LOS - 2  Admit date - 07/29/2023    Chief Complaint  Patient presents with   Shortness of Breath            Brief Narrative (HPI from H&P)   48 y.o. male with medical history significant of type 2 diabetes, benign spinal cord neoplasm (s/p resection 2015), ARDS requiring mechanical ventilation x 2 presenting to the ED with shortness of breath.  Previously he also had evidence of acute on chronic diastolic CHF on his earlier admission in November, his shortness of breath this time around is exertional in nature he also has orthopnea, CTA ruled out PE but he did have bilateral groundglass opacities.  He was placed on oxygen, started on diuretics along with steroids and antibiotics, seen by [facility] and admitted by hospitalist team for shortness of breath evaluation and treatment.   Subjective:    Gary Conway today reports dyspnea has improved, denies fever, cough or shortness of breath.      Assessment  & Plan :    Acute hypoxic respiratory failure Bilateral pulmonary infiltrate/legionella pneumonia Hx ARDs x 2 in the last 6 months requiring intubation, latter admission for influenza Acute on chronic heart failure with a preserved EF Elevated right hemidiaphragm Presentation this time suspicious for CHF along with atypical infection is also possible, he has been placed on IV steroids 1 dose given in the ER, empiric antibiotics for atypical infection along with IV diuretics, his chest x-ray has significantly improved and he has shown clinical  improvement as well wondering if CHF is playing a prominent role this time around, echo is stable with preserved EF continues to have diastolic dysfunction, PCCM on board. -Patient was encouraged use incentive spirometry and flutter valve -on IV Lasix, -2.6 L since admission, IV Lasix held yesterday due to worsening renal function, creatinine has improved today, will start on p.o. Lasix 40 mg daily. -Was encouraged to use incentive  spirometry and flutter valve -Continue with antibiotics, will DC Rocephin and keep on azithromycin x 7 days for Legionella pneumonia -Modified barium study within normal limit. -2D echo with a preserved EF, with indeterminant diastolic filling -Respiratory viral panel is negative -Pulmonary to follow-up as an outpatient  Hypertension.  -Blood pressure improved on Coreg and BiDil   Anemia of chronic kidney disease  B12 deficiency  Folic deficiency -Patient with baseline anemia, low iron, but elevated ferritin, discussed with patient, never had colonoscopy or endoscopy, discussed to follow-up with PCP regarding referral for colonoscopy. -Will start on IM supplement for low B12 level. -Start on folic acid replacement as well  Hypokalemia -Secondary to IV diuresis, replaced  AKI on CKD 3B.  Baseline creatinine appears to be close to 1.5.  Monitor closely with diuretics and recent IV dye for CTA.   -Creatinine down to 1.3 today, will resume on Lasix  DM Type II.  On Lantus and sliding scale monitor and adjust. -CBG remains elevated, will increase Semglee to 25 units.  Lab Results  Component Value Date   HGBA1C 6.8 (H) 07/29/2023   CBG (last 3)  Recent Labs    07/31/23 1946 08/01/23 0818 08/01/23 1136  GLUCAP 241* 154* 195*        Condition - Fair  Family Communication  :  None  Code Status :  Full  Consults  :  PCCM  PUD Prophylaxis :    Procedures  :     CTA  Breathing motion.  No segmental or larger pulmonary embolism. Severely elevated  right hemidiaphragm. Tiny pleural effusions. Multifocal scattered ground-glass type lung infiltrative opacities. These have a different configuration in the opacities seen previously in November 2024. There are several enlarged mediastinal lymph nodes as well which are also increased from that examination. Please correlate with clinical presentation recommend short follow-up or additional workup as clinically directed.  TTE - 1. Left ventricular ejection fraction, by estimation, is 55 to 60%. The left ventricle has normal function. The left ventricle has no regional wall motion abnormalities. Indeterminate diastolic filling due to E-A fusion.  2. Right ventricular systolic function is normal. The right ventricular size is normal. Tricuspid regurgitation signal is inadequate for assessing PA pressure.  3. The mitral valve is grossly normal. Trivial mitral valve regurgitation. No evidence of mitral stenosis.  4. The aortic valve is tricuspid. Aortic valve regurgitation is not visualized. No aortic stenosis is present.  5. The inferior vena cava is normal in size with greater than 50% respiratory variability, suggesting right atrial pressure of 3 mmHg.      Disposition Plan  :    Status is: Observation   DVT Prophylaxis  :    enoxaparin (LOVENOX) injection 40 mg Start: 07/29/23 1630  Lab Results  Component Value Date   PLT 191 08/01/2023    Diet :  Diet Order             Diet Carb Modified Fluid consistency: Thin; Room service appropriate? Yes  Diet effective now                    Inpatient Medications  Scheduled Meds:  azithromycin  500 mg Oral Daily   carvedilol  12.5 mg Oral BID WC   cyanocobalamin  1,000 mcg Subcutaneous Daily   enoxaparin (LOVENOX) injection  40 mg Subcutaneous Q24H   folic acid  1 mg Oral Daily   insulin aspart  0-15 Units Subcutaneous TID WC   insulin aspart  0-5 Units Subcutaneous QHS   insulin glargine  25 Units Subcutaneous Daily    isosorbide-hydrALAZINE  2 tablet Oral TID   mometasone-formoterol  2 puff Inhalation BID   Continuous Infusions:  cefTRIAXone (ROCEPHIN)  IV 2 g (07/31/23 1709)   PRN Meds:.acetaminophen **OR** acetaminophen, alum & mag hydroxide-simeth  Antibiotics  :    Anti-infectives (From admission, onward)    Start     Dose/Rate Route Frequency Ordered Stop   08/01/23 1130  azithromycin (ZITHROMAX) tablet 500 mg        500 mg Oral Daily 08/01/23 1036     07/30/23 1600  cefTRIAXone (ROCEPHIN) 2 g in sodium chloride 0.9 % 100 mL IVPB        2 g 200 mL/hr over 30 Minutes Intravenous Every 24 hours 07/29/23 1849 08/04/23 1559   07/30/23 1600  azithromycin (ZITHROMAX) 500 mg in sodium chloride 0.9 % 250 mL IVPB  Status:  Discontinued        500 mg 250 mL/hr over 60 Minutes Intravenous Every 24 hours 07/29/23 1849 08/01/23 1036   07/29/23 1615  cefTRIAXone (ROCEPHIN) 1 g in sodium chloride 0.9 % 100 mL IVPB        1 g 200 mL/hr over 30 Minutes Intravenous  Once 07/29/23 1606 07/29/23 1708   07/29/23 1615  azithromycin (ZITHROMAX) 500 mg in sodium chloride 0.9 % 250 mL IVPB        500 mg 250 mL/hr over 60 Minutes Intravenous  Once 07/29/23 1606 07/29/23 1811         Objective:   Vitals:   07/31/23 2326 08/01/23 0400 08/01/23 0800 08/01/23 1136  BP: (!) 129/91 (!) 134/96 (!) 135/97 106/63  Pulse: (!) 109 (!) 123 (!) 118 (!) 110  Resp: 20 18 18 20   Temp: 98.3 F (36.8 C) 98.3 F (36.8 C) 98.6 F (37 C) 98.4 F (36.9 C)  TempSrc: Oral Oral Oral Oral  SpO2: 92% 96% 96% 90%  Weight:      Height:        Wt Readings from Last 3 Encounters:  07/31/23 89.4 kg  07/11/23 83.6 kg  06/24/23 84 kg     Intake/Output Summary (Last 24 hours) at 08/01/2023 1257 Last data filed at 08/01/2023 1251 Gross per 24 hour  Intake 720 ml  Output 2150 ml  Net -1430 ml     Physical Exam  Awake Alert, Oriented X 3, No new F.N deficits, Normal affect Symmetrical Chest wall movement, good air entry  bilaterally, no wheezing RRR,No Gallops,Rubs or new Murmurs, No Parasternal Heave +ve B.Sounds, Abd Soft, No tenderness, No rebound - guarding or rigidity. No Cyanosis, Clubbing or edema, No new Rash or bruise        Data Review:    Recent Labs  Lab 07/29/23 0924 07/30/23 0450 07/31/23 0512 08/01/23 0545  WBC 10.0 13.3* 9.1 7.4  HGB 11.0* 9.6* 8.7* 9.5*  HCT 35.0* 30.0* 27.4* 30.7*  PLT 227 231 190 191  MCV 71.6* 70.8* 71.2* 73.1*  MCH 22.5* 22.6* 22.6* 22.6*  MCHC 31.4 32.0 31.8 30.9  RDW 17.5* 17.7* 18.4* 18.4*  LYMPHSABS 0.8  --  0.6* 0.7  MONOABS 0.4  --  0.6 0.8  EOSABS 0.1  --  0.2 0.3  BASOSABS 0.0  --  0.0 0.0    Recent Labs  Lab 07/29/23 0857 07/29/23 1610 07/29/23 9604 07/29/23 1138 07/29/23 1824 07/29/23 2058 07/30/23 0450 07/31/23 0512 08/01/23 0545 08/01/23 0731  NA  --  137  --   --   --   --  135 134* 140  --   K  --  4.3  --   --   --   --  4.2 3.2* 3.8  --   CL  --  105  --   --   --   --  103 105 108  --   CO2  --  19*  --   --   --   --  23 22 23   --   ANIONGAP  --  13  --   --   --   --  9 7 9   --   GLUCOSE  --  193*  --   --   --   --  272* 374* 200*  --   BUN  --  10  --   --   --   --  24* 23* 17  --   CREATININE  --  1.19  --   --   --   --  1.62* 1.71* 1.31*  --   CRP  --   --   --   --  0.9  --   --  1.6* 4.0*  --   PROCALCITON  --   --   --   --  <0.10  --  0.10  --  0.13  --   LATICACIDVEN  --   --  1.5 1.1  --   --   --   --   --   --   HGBA1C  --   --   --   --   --  6.8*  --   --   --   --   BNP 297.9*  --   --   --   --   --  338.9* 205.6*  --  305.8*  MG  --   --   --   --   --   --   --  2.2 2.1  --   CALCIUM  --  8.9  --   --   --   --  8.6* 8.0* 8.5*  --       Recent Labs  Lab 07/29/23 0857 07/29/23 0924 07/29/23 0943 07/29/23 1138 07/29/23 1824 07/29/23 2058 07/30/23 0450 07/31/23 0512 08/01/23 0545 08/01/23 0731  CRP  --   --   --   --  0.9  --   --  1.6* 4.0*  --   PROCALCITON  --   --   --   --  <0.10  --   0.10  --  0.13  --   LATICACIDVEN  --   --  1.5 1.1  --   --   --   --   --   --   HGBA1C  --   --   --   --   --  6.8*  --   --   --   --   BNP 297.9*  --   --   --   --   --  338.9* 205.6*  --  305.8*  MG  --   --   --   --   --   --   --  2.2 2.1  --   CALCIUM  --  8.9  --   --   --   --  8.6* 8.0* 8.5*  --     --------------------------------------------------------------------------------------------------------------- Lab Results  Component Value Date   TRIG 229 (H) 03/25/2023    Lab Results  Component Value  Date   HGBA1C 6.8 (H) 07/29/2023   No results for input(s): "TSH", "T4TOTAL", "FREET4", "T3FREE", "THYROIDAB" in the last 72 hours. Recent Labs    07/31/23 0512  VITAMINB12 183  FOLATE 5.9*   ------------------------------------------------------------------------------------------------------------------ Cardiac Enzymes No results for input(s): "CKMB", "TROPONINI", "MYOGLOBIN" in the last 168 hours.  Invalid input(s): "CK"  Micro Results Recent Results (from the past 240 hours)  Resp panel by RT-PCR (RSV, Flu A&B, Covid) Anterior Nasal Swab     Status: None   Collection Time: 07/29/23  8:58 AM   Specimen: Anterior Nasal Swab  Result Value Ref Range Status   SARS Coronavirus 2 by RT PCR NEGATIVE NEGATIVE Final   Influenza A by PCR NEGATIVE NEGATIVE Final   Influenza B by PCR NEGATIVE NEGATIVE Final    Comment: (NOTE) The Xpert Xpress SARS-CoV-2/FLU/RSV plus assay is intended as an aid in the diagnosis of influenza from Nasopharyngeal swab specimens and should not be used as a sole basis for treatment. Nasal washings and aspirates are unacceptable for Xpert Xpress SARS-CoV-2/FLU/RSV testing.  Fact Sheet for Patients: BloggerCourse.com  Fact Sheet for Healthcare Providers: SeriousBroker.it  This test is not yet approved or cleared by the [place] FDA and has been authorized for detection and/or  diagnosis of SARS-CoV-2 by FDA under an Emergency Use Authorization (EUA). This EUA will remain in effect (meaning this test can be used) for the duration of the COVID-19 declaration under Section 564(b)(1) of the Act, 21 U.S.C. section 360bbb-3(b)(1), unless the authorization is terminated or revoked.     Resp Syncytial Virus by PCR NEGATIVE NEGATIVE Final    Comment: (NOTE) Fact Sheet for Patients: BloggerCourse.com  Fact Sheet for Healthcare Providers: SeriousBroker.it  This test is not yet approved or cleared by the [place] FDA and has been authorized for detection and/or diagnosis of SARS-CoV-2 by FDA under an Emergency Use Authorization (EUA). This EUA will remain in effect (meaning this test can be used) for the duration of the COVID-19 declaration under Section 564(b)(1) of the Act, 21 U.S.C. section 360bbb-3(b)(1), unless the authorization is terminated or revoked.  Performed at [facility], [address], [place], [place] [address]   Culture, blood (routine x 2)     Status: None (Preliminary result)   Collection Time: 07/29/23  9:24 AM   Specimen: BLOOD RIGHT HAND  Result Value Ref Range Status   Specimen Description BLOOD RIGHT HAND  Final   Special Requests   Final    BOTTLES DRAWN AEROBIC ONLY Blood Culture results may not be optimal due to an inadequate volume of blood received in culture bottles   Culture   Final    NO GROWTH 2 DAYS Performed at [facility], [address] [place], [place] [address]    Report Status PENDING  Incomplete  Culture, blood (routine x 2)     Status: None (Preliminary result)   Collection Time: 07/29/23  9:34 AM   Specimen: BLOOD LEFT HAND  Result Value Ref Range Status   Specimen Description BLOOD LEFT HAND  Final   Special Requests   Final    BOTTLES DRAWN AEROBIC AND ANAEROBIC Blood Culture results may not be optimal due to an inadequate volume of blood  received in culture bottles   Culture   Final    NO GROWTH 2 DAYS Performed at [facility], [address] [place], [place] [address]    Report Status PENDING  Incomplete  Respiratory (~20 pathogens) panel  by PCR     Status: None   Collection Time: 07/29/23  6:13 PM   Specimen: Nasopharyngeal Swab; Respiratory  Result Value Ref Range Status   Adenovirus NOT DETECTED NOT DETECTED Final   Coronavirus 229E NOT DETECTED NOT DETECTED Final    Comment: (NOTE) The Coronavirus on the Respiratory Panel, DOES NOT test for the novel  Coronavirus (2019 nCoV)    Coronavirus HKU1 NOT DETECTED NOT DETECTED Final   Coronavirus NL63 NOT DETECTED NOT DETECTED Final   Coronavirus OC43 NOT DETECTED NOT DETECTED Final   Metapneumovirus NOT DETECTED NOT DETECTED Final   Rhinovirus / Enterovirus NOT DETECTED NOT DETECTED Final   Influenza A NOT DETECTED NOT DETECTED Final   Influenza B NOT DETECTED NOT DETECTED Final   Parainfluenza Virus 1 NOT DETECTED NOT DETECTED Final   Parainfluenza Virus 2 NOT DETECTED NOT DETECTED Final   Parainfluenza Virus 3 NOT DETECTED NOT DETECTED Final   Parainfluenza Virus 4 NOT DETECTED NOT DETECTED Final   Respiratory Syncytial Virus NOT DETECTED NOT DETECTED Final   Bordetella pertussis NOT DETECTED NOT DETECTED Final   Bordetella Parapertussis NOT DETECTED NOT DETECTED Final   Chlamydophila pneumoniae NOT DETECTED NOT DETECTED Final   Mycoplasma pneumoniae NOT DETECTED NOT DETECTED Final    Comment: Performed at [facility], [address], [place], [place] [address]    Radiology Report DG ESOPHAGUS W DOUBLE CM (HD) Result Date: 07/30/2023 CLINICAL DATA:  Patient with a history of recurrent dyspnea secondary to diffuse pulmonary infiltrates of unclear etiology. EXAM: ESOPHAGUS/BARIUM SWALLOW/TABLET STUDY TECHNIQUE: Combined double and single contrast examination was performed using effervescent crystals, high-density barium, and thin liquid barium. This  exam was performed by Alwyn Ren NP, and was supervised and interpreted by Dr. Loreta Ave. FLUOROSCOPY: Radiation Exposure Index (as provided by the fluoroscopic device): 26.60 mGy Kerma COMPARISON:  None Available. FINDINGS: Swallowing: Appears normal. No vestibular penetration or aspiration seen. Pharynx: Unremarkable. Esophagus: Normal appearance. Esophageal motility: Within normal limits. Hiatal Hernia: None. Gastroesophageal reflux: None visualized. Ingested 13mm barium tablet: Passed normally. Other: None. IMPRESSION: Normal barium study. Electronically Signed   By: Gilmer Mor D.O.   On: 07/30/2023 13:55     Signature  -   Huey Bienenstock M.D on 08/01/2023 at 12:57 PM   -  To page go to [email or web address]

## 2023-08-02 ENCOUNTER — Other Ambulatory Visit (HOSPITAL_COMMUNITY): Payer: Self-pay

## 2023-08-02 DIAGNOSIS — J9600 Acute respiratory failure, unspecified whether with hypoxia or hypercapnia: Secondary | ICD-10-CM | POA: Diagnosis present

## 2023-08-02 HISTORY — DX: Acute respiratory failure, unspecified whether with hypoxia or hypercapnia: J96.00

## 2023-08-02 LAB — CBC WITH DIFFERENTIAL/PLATELET
Abs Immature Granulocytes: 0.03 10*3/uL (ref 0.00–0.07)
Basophils Absolute: 0 10*3/uL (ref 0.0–0.1)
Basophils Relative: 0 %
Eosinophils Absolute: 0.3 10*3/uL (ref 0.0–0.5)
Eosinophils Relative: 5 %
HCT: 28.7 % — ABNORMAL LOW (ref 39.0–52.0)
Hemoglobin: 9.1 g/dL — ABNORMAL LOW (ref 13.0–17.0)
Immature Granulocytes: 1 %
Lymphocytes Relative: 15 %
Lymphs Abs: 0.9 10*3/uL (ref 0.7–4.0)
MCH: 22.6 pg — ABNORMAL LOW (ref 26.0–34.0)
MCHC: 31.7 g/dL (ref 30.0–36.0)
MCV: 71.2 fL — ABNORMAL LOW (ref 80.0–100.0)
Monocytes Absolute: 0.5 10*3/uL (ref 0.1–1.0)
Monocytes Relative: 9 %
Neutro Abs: 4.3 10*3/uL (ref 1.7–7.7)
Neutrophils Relative %: 70 %
Platelets: 193 10*3/uL (ref 150–400)
RBC: 4.03 MIL/uL — ABNORMAL LOW (ref 4.22–5.81)
RDW: 17.8 % — ABNORMAL HIGH (ref 11.5–15.5)
WBC: 6.1 10*3/uL (ref 4.0–10.5)
nRBC: 0 % (ref 0.0–0.2)

## 2023-08-02 LAB — BASIC METABOLIC PANEL
Anion gap: 7 (ref 5–15)
BUN: 17 mg/dL (ref 6–20)
CO2: 24 mmol/L (ref 22–32)
Calcium: 8.2 mg/dL — ABNORMAL LOW (ref 8.9–10.3)
Chloride: 105 mmol/L (ref 98–111)
Creatinine, Ser: 1.35 mg/dL — ABNORMAL HIGH (ref 0.61–1.24)
GFR, Estimated: >60 mL/min (ref >60)
Glucose, Bld: 235 mg/dL — ABNORMAL HIGH (ref 70–99)
Potassium: 3.7 mmol/L (ref 3.5–5.1)
Sodium: 136 mmol/L (ref 135–145)

## 2023-08-02 LAB — GLUCOSE, CAPILLARY
Glucose-Capillary: 163 mg/dL — ABNORMAL HIGH (ref 70–99)
Glucose-Capillary: 266 mg/dL — ABNORMAL HIGH (ref 70–99)

## 2023-08-02 LAB — PROCALCITONIN: Procalcitonin: <0.1 ng/mL

## 2023-08-02 LAB — MAGNESIUM: Magnesium: 2 mg/dL (ref 1.7–2.4)

## 2023-08-02 LAB — C-REACTIVE PROTEIN: CRP: 4.2 mg/dL — ABNORMAL HIGH (ref <1.0)

## 2023-08-02 LAB — TSH: TSH: 1.156 u[IU]/mL (ref 0.350–4.500)

## 2023-08-02 LAB — BRAIN NATRIURETIC PEPTIDE: B Natriuretic Peptide: 127.9 pg/mL — ABNORMAL HIGH (ref 0.0–100.0)

## 2023-08-02 MED ORDER — ISOSORBIDE MONONITRATE ER 30 MG PO TB24
30.0000 mg | ORAL_TABLET | Freq: Every day | ORAL | 0 refills | Status: DC
  Filled 2023-08-02: qty 30, 30d supply, fill #0

## 2023-08-02 MED ORDER — FOLIC ACID 1 MG PO TABS
1.0000 mg | ORAL_TABLET | Freq: Every day | ORAL | 0 refills | Status: DC
  Filled 2023-08-02: qty 30, 30d supply, fill #0

## 2023-08-02 MED ORDER — FERROUS SULFATE 325 (65 FE) MG PO TABS
325.0000 mg | ORAL_TABLET | Freq: Every day | ORAL | 0 refills | Status: DC
  Filled 2023-08-02: qty 30, 30d supply, fill #0

## 2023-08-02 MED ORDER — AZITHROMYCIN 500 MG PO TABS
500.0000 mg | ORAL_TABLET | Freq: Every day | ORAL | 0 refills | Status: DC
  Filled 2023-08-02: qty 3, 3d supply, fill #0

## 2023-08-02 MED ORDER — LANTUS SOLOSTAR 100 UNIT/ML ~~LOC~~ SOPN
30.0000 [IU] | PEN_INJECTOR | Freq: Every day | SUBCUTANEOUS | 0 refills | Status: DC
  Filled 2023-08-02: qty 9, 30d supply, fill #0

## 2023-08-02 MED ORDER — ALBUTEROL SULFATE HFA 108 (90 BASE) MCG/ACT IN AERS
2.0000 | INHALATION_SPRAY | Freq: Four times a day (QID) | RESPIRATORY_TRACT | 0 refills | Status: DC | PRN
  Filled 2023-08-02: qty 6.7, 25d supply, fill #0

## 2023-08-02 MED ORDER — CARVEDILOL 25 MG PO TABS
25.0000 mg | ORAL_TABLET | Freq: Two times a day (BID) | ORAL | 0 refills | Status: DC
Start: 2023-08-02 — End: 2023-10-11
  Filled 2023-08-02: qty 30, 15d supply, fill #0

## 2023-08-02 MED ORDER — HYDRALAZINE HCL 50 MG PO TABS
50.0000 mg | ORAL_TABLET | Freq: Three times a day (TID) | ORAL | 0 refills | Status: DC
  Filled 2023-08-02: qty 90, 30d supply, fill #0

## 2023-08-02 MED ORDER — VITAMIN B-12 1000 MCG PO TABS
1000.0000 ug | ORAL_TABLET | Freq: Every day | ORAL | 0 refills | Status: DC
  Filled 2023-08-02: qty 30, 30d supply, fill #0

## 2023-08-02 MED ORDER — FUROSEMIDE 80 MG PO TABS
80.0000 mg | ORAL_TABLET | Freq: Every day | ORAL | 0 refills | Status: DC
  Filled 2023-08-02: qty 30, 30d supply, fill #0

## 2023-08-02 MED ORDER — MOMETASONE FURO-FORMOTEROL FUM 200-5 MCG/ACT IN AERO
2.0000 | INHALATION_SPRAY | Freq: Two times a day (BID) | RESPIRATORY_TRACT | 0 refills | Status: DC
  Filled 2023-08-02: qty 13, 30d supply, fill #0

## 2023-08-02 MED ORDER — ISOSORB DINITRATE-HYDRALAZINE 20-37.5 MG PO TABS
1.0000 | ORAL_TABLET | Freq: Three times a day (TID) | ORAL | 0 refills | Status: DC
  Filled 2023-08-02: qty 90, 30d supply, fill #0

## 2023-08-02 NOTE — Progress Notes (Signed)
 DISCHARGE NOTE HOME Gary Conway to be discharged Home per MD order. Discussed prescriptions and follow up appointments with the patient. Prescriptions given to patient; medication list explained in detail. Patient verbalized understanding.  Skin clean, dry and intact without evidence of skin break down, no evidence of skin tears noted. IV catheter discontinued intact. Site without signs and symptoms of complications. Dressing and pressure applied. Pt denies pain at the site currently. No complaints noted.  Patient free of lines, drains, and wounds.   An After Visit Summary (AVS) was printed and given to the patient. Patient escorted via wheelchair, and discharged home via private auto.  Velia Meyer, RN

## 2023-08-02 NOTE — Discharge Summary (Signed)
 Physician Discharge Summary  Gary Conway ZOX:[id number] DOB: Jul 22, 1975 DOA: 07/29/2023  PCP: Dulce Sellar, NP  Admit date: 07/29/2023 Discharge date: 08/02/2023  Admitted From: (Home) Disposition:  (Home )  Recommendations for Outpatient Follow-up:  Follow up with PCP  Patient was instructed to keep up his follow-up appointment with cardiology 3/11, pulmonary 3/26, and PCP/3. Patient was provided all meds at time of discharge from hospital spittle including insulin, medications, inhaler including Dulera x 2, albuterol . Patient will need iron deficiency anemia workup as an outpatient including colonoscopy. Follow-up B12 level and folic acid as an outpatient   Diet recommendation: Heart Healthy / Carb Modified  Brief/Interim Summary:  48 y.o. male with medical history significant of type 2 diabetes, benign spinal cord neoplasm (s/p resection 2015), ARDS requiring mechanical ventilation x 2 presenting to the ED with shortness of breath.  Previously he also had evidence of acute on chronic diastolic CHF on his earlier admission in November, his shortness of breath this time around is exertional in nature he also has orthopnea, CTA ruled out PE but he did have bilateral groundglass opacities.  He was placed on oxygen, started on diuretics along with steroids and antibiotics, seen by [facility] and admitted by hospitalist team for shortness of breath evaluation and treatment.    Acute hypoxic respiratory failure Multifocal pneumonia due to Legionella Hx ARDs x 2 in the last 6 months requiring intubation, latter admission for influenza Acute on chronic heart failure with a preserved EF Elevated right hemidiaphragm Presentation this time suspicious for CHF along with atypical infection due to Legionella pneumonia, received some IV steroids initially, but then has been discontinued, patient was kept on antibiotics, Legionella antigen came back positive, so antibiotics narrowed to erythromycin,  to finish total of 7 days.   -Significant evidence of volume overload, so patient was on IV diuresis, -2.6 L during hospital stay, to resume home Lasix at time of discharge, to follow-up with cardiology appointment has been scheduled by CHF team 08/06/2023. -He was encouraged to check incentive spirometer and flutter valve with him and keep using them -Pulmonary to arrange outpatient follow-up ointment scheduled for 3/27, patient was provided with Dulera (x 2 inhalers) -Respiratory viral panel is negative -Urinary antigen is positive   Hypertension.  -Patient has been adjusted given sinus tachycardia(TSH within normal limits), his Coreg dose has been increased, blood pressure has been elevated so he was started on BiDil as well.    Anemia of chronic kidney disease  B12 deficiency  Folic deficiency -Patient with baseline anemia, low iron, but elevated ferritin, discussed with patient, never had colonoscopy or endoscopy, discussed to follow-up with PCP regarding referral for colonoscopy. -Receive IM B12 during hospital stay, will be discharged on p.o. supplement -Started on folic acid replacement as well   Hypokalemia -Secondary to IV diuresis, replaced   AKI on CKD 3B.  Baseline creatinine appears to be close to 1.5.  Monitor closely with diuretics and recent IV dye for CTA.   -Closely as he is on Lasix   DM Type II.  Resume home regimen on discharge  Discharge Diagnoses:  Principal Problem:   Multifocal pneumonia Active Problems:   DM (diabetes mellitus) (HCC)   Chronic diastolic HF (heart failure) (HCC)   Acute respiratory failure [facility])    Discharge Instructions  Discharge Instructions     Diet - low sodium heart healthy   Complete by: As directed    Discharge instructions   Complete by: As directed    Follow  with Primary MD Dulce Sellar, NP scheduled appointment/07/2023, follow-up with cardiology on 08/06/2023, follow on pulmonary on 08/21/2023  Get CBC, CMP, 2 view  Chest X ray checked  by Primary MD next visit.    Activity: As tolerated with Full fall precautions use walker/cane & assistance as needed   Disposition Home    Diet: Heart Healthy/carb modified  For Heart failure patients - Check your Weight same time everyday, if you gain over 2 pounds, or you develop in leg swelling, experience more shortness of breath or chest pain, call your Primary MD immediately. Follow Cardiac Low Salt Diet and 1.5 lit/day fluid restriction.   On your next visit with your primary care physician please Get Medicines reviewed and adjusted.   Please request your Prim.MD to go over all Hospital Tests and Procedure/Radiological results at the follow up, please get all Hospital records sent to your Prim MD by signing hospital release before you go home.   If you experience worsening of your admission symptoms, develop shortness of breath, life threatening emergency, suicidal or homicidal thoughts you must seek medical attention immediately by calling 911 or calling your MD immediately  if symptoms less severe.  You Must read complete instructions/literature along with all the possible adverse reactions/side effects for all the Medicines you take and that have been prescribed to you. Take any new Medicines after you have completely understood and accpet all the possible adverse reactions/side effects.   Do not drive, operating heavy machinery, perform activities at heights, swimming or participation in water activities or provide baby sitting services if your were admitted for syncope or siezures until you have seen by Primary MD or a Neurologist and advised to do so again.  Do not drive when taking Pain medications.    Do not take more than prescribed Pain, Sleep and Anxiety Medications  Special Instructions: If you have smoked or chewed Tobacco  in the last 2 yrs please stop smoking, stop any regular Alcohol  and or any Recreational drug use.  Wear Seat belts  while driving.   Please note  You were cared for by a hospitalist during your hospital stay. If you have any questions about your discharge medications or the care you received while you were in the hospital after you are discharged, you can call the unit and asked to speak with the hospitalist on call if the hospitalist that took care of you is not available. Once you are discharged, your primary care physician will handle any further medical issues. Please note that NO REFILLS for any discharge medications will be authorized once you are discharged, as it is imperative that you return to your primary care physician (or establish a relationship with a primary care physician if you do not have one) for your aftercare needs so that they can reassess your need for medications and monitor your lab values.   Increase activity slowly   Complete by: As directed       Allergies as of 08/02/2023       Reactions   Food Hives, Itching, Rash   Mayonnaise - itching, rash, hives   Mustard Hives, Itching, Rash        Medication List     STOP taking these medications    HumaLOG KwikPen 100 UNIT/ML KwikPen Generic drug: insulin lispro   ibuprofen 200 MG tablet Commonly known as: ADVIL       TAKE these medications    Accu-Chek Guide w/Device Kit Use to test blood sugar  in the morning, at noon, and at bedtime.   albuterol 108 (90 Base) MCG/ACT inhaler Commonly known as: VENTOLIN HFA Inhale 2 puffs into the lungs every 6 (six) hours as needed for wheezing or shortness of breath.   azithromycin 500 MG tablet Commonly known as: ZITHROMAX Take 1 tablet (500 mg total) by mouth daily.   BD Pen Needle Nano U/F 32G X 4 MM Misc Generic drug: Insulin Pen Needle For 4 times a day insulin Subcutaneously.   blood glucose meter kit and supplies Kit Dispense based on patient and insurance preference. Use up to four times daily as directed. (FOR ICD-9 250.00, 250.01). For QAC - HS accuchecks.    carvedilol 25 MG tablet Commonly known as: COREG Take 1 tablet (25 mg total) by mouth 2 (two) times daily with a meal. What changed:  medication strength how much to take   cyanocobalamin 1000 MCG tablet Commonly known as: VITAMIN B12 Take 1 tablet (1,000 mcg total) by mouth daily.   ferrous sulfate 325 (65 FE) MG tablet Take 1 tablet (325 mg total) by mouth daily with breakfast. What changed: when to take this   folic acid 1 MG tablet Commonly known as: FOLVITE Take 1 tablet (1 mg total) by mouth daily.   furosemide 80 MG tablet Commonly known as: LASIX Take 1 tablet (80 mg total) by mouth daily. What changed: when to take this   hydrALAZINE 50 MG tablet Commonly known as: APRESOLINE Take 1 tablet (50 mg total) by mouth 3 (three) times daily.   isosorbide mononitrate 30 MG 24 hr tablet Commonly known as: IMDUR Take 1 tablet (30 mg total) by mouth daily.   Lantus SoloStar 100 UNIT/ML Solostar Pen Generic drug: insulin glargine Inject 30 Units into the skin daily. What changed: how much to take   mometasone-formoterol 200-5 MCG/ACT Aero Commonly known as: DULERA Inhale 2 puffs into the lungs 2 (two) times daily.        Follow-up Information     [facility]. Go in 7 day(s).   Specialty: Cardiology Why: Hospital follow up 08/06/2023 @ 2:45 pm PLEASE bring a current medication list to appointment FREE valet parking, Entrance C, Off [employer] information: [address] [place] [address] [phone]        Glenford Bayley, NP Follow up on 08/21/2023.   Specialty: Pulmonary Disease Why: 11:30 am Contact information: [address] [place] [address] [phone]         Dulce Sellar, NP Follow up on 08/29/2023.   Specialty: Family Medicine Why: 11 am Contact information: [address] [place] [address] [phone]                 Allergies  Allergen Reactions   Food Hives, Itching and Rash    Mayonnaise - itching, rash, hives   Mustard Hives, Itching and Rash    Consultations: Pulmonary   Procedures/Studies: DG ESOPHAGUS W DOUBLE CM (HD) Result Date: 07/30/2023 CLINICAL DATA:  Patient with a history of recurrent dyspnea secondary to diffuse pulmonary infiltrates of unclear etiology. EXAM: ESOPHAGUS/BARIUM SWALLOW/TABLET STUDY TECHNIQUE: Combined double and single contrast examination was performed using effervescent crystals, high-density barium, and thin liquid barium. This exam was performed by Alwyn Ren NP, and was supervised and interpreted by Dr. Loreta Ave. FLUOROSCOPY: Radiation Exposure Index (as provided by the fluoroscopic device): 26.60 mGy Kerma COMPARISON:  None Available. FINDINGS: Swallowing: Appears normal. No vestibular penetration  or aspiration seen. Pharynx: Unremarkable. Esophagus: Normal appearance. Esophageal motility: Within normal limits. Hiatal Hernia: None. Gastroesophageal reflux: None visualized. Ingested 13mm barium tablet: Passed normally. Other: None. IMPRESSION: Normal barium study. Electronically Signed   By: Gilmer Mor D.O.   On: 07/30/2023 13:55   ECHOCARDIOGRAM COMPLETE Result Date: 07/30/2023    ECHOCARDIOGRAM REPORT   Patient Name:   JOSHOA SHAWLER Date of Exam: 07/30/2023 Medical Rec #:  [id number]        Height:       68.0 in Accession #:    [id number]       Weight:       195.8 lb Date of Birth:  [birth year]        BSA:          2.025 m Patient Age:    47 years         BP:           141/110 mmHg Patient Gender: M                HR:           106 bpm. Exam Location:  Inpatient Procedure: 2D Echo, Cardiac Doppler, Color Doppler and Intracardiac            Opacification Agent (Both Spectral and Color Flow Doppler were            utilized during procedure). Indications:    Dyspnea  History:        Patient has prior history of Echocardiogram examinations, most                  recent 03/25/2023. Risk Factors:Hypertension. Shock.  Sonographer:    Vern Claude Referring Phys: [id number] BABCOCK IMPRESSIONS  1. Left ventricular ejection fraction, by estimation, is 55 to 60%. The left ventricle has normal function. The left ventricle has no regional wall motion abnormalities. Indeterminate diastolic filling due to E-A fusion.  2. Right ventricular systolic function is normal. The right ventricular size is normal. Tricuspid regurgitation signal is inadequate for assessing PA pressure.  3. The mitral valve is grossly normal. Trivial mitral valve regurgitation. No evidence of mitral stenosis.  4. The aortic valve is tricuspid. Aortic valve regurgitation is not visualized. No aortic stenosis is present.  5. The inferior vena cava is normal in size with greater than 50% respiratory variability, suggesting right atrial pressure of 3 mmHg. FINDINGS  Left Ventricle: Left ventricular ejection fraction, by estimation, is 55 to 60%. The left ventricle has normal function. The left ventricle has no regional wall motion abnormalities. Definity contrast agent was given IV to delineate the left ventricular  endocardial borders. The left ventricular internal cavity size was normal in size. There is no left ventricular hypertrophy. Indeterminate diastolic filling due to E-A fusion. Right Ventricle: The right ventricular size is normal. No increase in right ventricular wall thickness. Right ventricular systolic function is normal. Tricuspid regurgitation signal is inadequate for assessing PA pressure. Left Atrium: Left atrial size was normal in size. Right Atrium: Right atrial size was normal in size. Pericardium: Trivial pericardial effusion is present. Mitral Valve: The mitral valve is grossly normal. Trivial mitral valve regurgitation. No evidence of mitral valve stenosis. MV peak gradient, 8.3 mmHg. The mean mitral valve gradient is 3.0 mmHg. Tricuspid Valve: The tricuspid valve is grossly normal.  Tricuspid valve regurgitation is trivial. No evidence of tricuspid stenosis. Aortic Valve: The aortic valve is tricuspid. Aortic valve regurgitation is not visualized. No aortic  stenosis is present. Pulmonic Valve: The pulmonic valve was grossly normal. Pulmonic valve regurgitation is not visualized. No evidence of pulmonic stenosis. Aorta: The aortic root and ascending aorta are structurally normal, with no evidence of dilitation. Venous: The inferior vena cava is normal in size with greater than 50% respiratory variability, suggesting right atrial pressure of 3 mmHg. IAS/Shunts: The atrial septum is grossly normal.  LEFT VENTRICLE PLAX 2D LVIDd:         4.60 cm      Diastology LVIDs:         3.10 cm      LV e' medial:    9.68 cm/s LV PW:         1.10 cm      LV E/e' medial:  12.8 LV IVS:        0.80 cm      LV e' lateral:   8.70 cm/s LVOT diam:     1.80 cm      LV E/e' lateral: 14.3 LV SV:         43 LV SV Index:   21 LVOT Area:     2.54 cm  LV Volumes (MOD) LV vol d, MOD A2C: 98.5 ml LV vol d, MOD A4C: 125.0 ml LV vol s, MOD A2C: 35.8 ml LV vol s, MOD A4C: 45.3 ml LV SV MOD A2C:     62.7 ml LV SV MOD A4C:     125.0 ml LV SV MOD BP:      68.9 ml RIGHT VENTRICLE             IVC RV Basal diam:  3.50 cm     IVC diam: 1.20 cm RV S prime:     12.30 cm/s LEFT ATRIUM           Index        RIGHT ATRIUM           Index LA diam:      3.50 cm 1.73 cm/m   RA Area:     12.80 cm LA Vol (A2C): 42.4 ml 20.94 ml/m  RA Volume:   28.90 ml  14.27 ml/m LA Vol (A4C): 48.0 ml 23.70 ml/m  AORTIC VALVE             PULMONIC VALVE LVOT Vmax:   103.05 cm/s PV Vmax:       0.91 m/s LVOT Vmean:  67.050 cm/s PV Peak grad:  3.3 mmHg LVOT VTI:    0.170 m  AORTA Ao Root diam: 3.10 cm Ao Asc diam:  2.70 cm MITRAL VALVE MV Area (PHT): 7.02 cm     SHUNTS MV Area VTI:   1.82 cm     Systemic VTI:  0.17 m MV Peak grad:  8.3 mmHg     Systemic Diam: 1.80 cm MV Mean grad:  3.0 mmHg MV Vmax:       1.44 m/s MV Vmean:      82.3 cm/s MV Decel Time: 108  msec MV E velocity: 124.00 cm/s Lennie Odor MD Electronically signed by Lennie Odor MD Signature Date/Time: 07/30/2023/8:38:57 AM    Final    DG Chest Port 1 View Result Date: 07/30/2023 CLINICAL DATA:  [id number] with shortness of breath.  No chest pain. EXAM: PORTABLE CHEST 1 VIEW COMPARISON:  Portable chest yesterday at 9:07 p.m., CTA chest yesterday at 1:54 a.m. FINDINGS: 6:30 a.m. There is significant improvement in bilateral airspace disease, with scattered airspace disease remaining with a basal gradient. The  opacities are considerably less widespread and confluent today consistent with improving fluid balance/edema and/or improving pneumonia. Small pleural effusions appear similar as well as elevated right hemidiaphragm. The mediastinum is stable. The cardiac size is normal. No new osseous findings. IMPRESSION: 1. Significant improvement in bilateral airspace disease, with scattered airspace disease remaining with a basal gradient. 2. The opacities are considerably less widespread and confluent today consistent with improving fluid balance/edema and/or improving pneumonia. 3. Small pleural effusions appear similar. Electronically Signed   By: Almira Bar M.D.   On: 07/30/2023 07:25   CT Angio Chest PE W and/or Wo Contrast Result Date: 07/29/2023 CLINICAL DATA:  Shortness of breath EXAM: CT ANGIOGRAPHY CHEST WITH CONTRAST TECHNIQUE: Multidetector CT imaging of the chest was performed using the standard protocol during bolus administration of intravenous contrast. Multiplanar CT image reconstructions and MIPs were obtained to evaluate the vascular anatomy. RADIATION DOSE REDUCTION: This exam was performed according to the departmental dose-optimization program which includes automated exposure control, adjustment of the mA and/or kV according to patient size and/or use of iterative reconstruction technique. CONTRAST:  75mL OMNIPAQUE IOHEXOL 350 MG/ML SOLN COMPARISON:  Chest x-ray 07/29/2023 and older. CT  without contrast 03/31/2023 FINDINGS: Cardiovascular: Heart is nonenlarged. Thoracic aorta is normal course and caliber. Trace pericardial fluid. There is some breathing motion identified throughout the examination. This limits evaluation including nondiagnostic for small and peripheral emboli. No segmental or larger pulmonary embolism identified. Mediastinum/Nodes: Slightly patulous thoracic esophagus. Thyroid gland is unremarkable. Prominent bilateral axillary nodes are seen, less than a cm in short axis. There is some prominent hilar nodes. There are a few enlarged mediastinal nodes identified. Example to the left of the carina measures 16 x 23 mm on series 5, image 46. Focus left mediastinum at the level of the aortic arch measures 21 x 12 mm. Subcarinal node has short axis dimension of 17 mm on series 5, image 61. These are all increased from the prior CT scan of November 2024. Lungs/Pleura: Tiny bilateral pleural effusions identified. There is scattered patchy ground-glass scattered in both lungs. Infectious, inflammatory etiologies are in the differential. No pneumothorax. Diffuse breathing motion. The opacities have a different configuration than that of November 2024. On that exam these are more consolidative areas of opacity rather than patchy ground-glass. Upper Abdomen: Adrenal glands are preserved in the upper abdomen. Malrotated, congenital kidneys. Musculoskeletal: Scattered degenerative changes of the spine. There is extensive laminectomies in the upper thoracic spine with sclerotic heterogeneous appearance to the T3 vertebral body, unchanged from previous. Please correlate with history. Patient's history of a spinal cord angioma severely elevated right hemidiaphragm. Review of the MIP images confirms the above findings. IMPRESSION: Breathing motion.  No segmental or larger pulmonary embolism. Severely elevated right hemidiaphragm. Tiny pleural effusions. Multifocal scattered ground-glass type lung  infiltrative opacities. These have a different configuration in the opacities seen previously in November 2024. There are several enlarged mediastinal lymph nodes as well which are also increased from that examination. Please correlate with clinical presentation recommend short follow-up or additional workup as clinically directed. Electronically Signed   By: Karen Kays M.D.   On: 07/29/2023 16:00   DG Chest Port 1 View Result Date: 07/29/2023 CLINICAL DATA:  Shortness of breath EXAM: PORTABLE CHEST 1 VIEW COMPARISON:  07/11/2023 x-ray and older FINDINGS: The left inferior costophrenic angle is clipped off the edge of the film. Normal cardiopericardial silhouette. No pneumothorax. Vascular congestion identified with some hazy opacity seen particularly diffusely in the left hemithorax.  Acute infiltrates possible. Question tiny right effusion. IMPRESSION: Hazy diffuse parenchymal and interstitial opacity seen throughout the left hemithorax. More mild changes on the right. Question tiny right effusion. Recommend short follow-up Electronically Signed   By: Karen Kays M.D.   On: 07/29/2023 10:18   DG Chest 2 View Result Date: 07/11/2023 CLINICAL DATA:  Pneumonia, flu, and ARDS. EXAM: CHEST - 2 VIEW COMPARISON:  Chest radiographs 06/19/2023, 06/17/2023 (multiple studies), 06/28/2022, 03/26/2023, 03/22/2023 FINDINGS: Interval extubation and interval removal of nasogastric tube. Cardiac silhouette and mediastinal contours are within limits. The lungs are clear. This actually is an improvement from all the above-listed comparison radiographs when pulmonary opacities were visualized. Mild elevation the right hemidiaphragm is unchanged. No pleural effusion or pneumothorax. No acute skeletal abnormality. Mild multilevel degenerative disc changes of the thoracic spine. IMPRESSION: 1. Interval extubation and interval removal of nasogastric tube. 2. Resolution of the prior bilateral airspace opacities. No acute  cardiopulmonary process. Electronically Signed   By: Neita Garnet M.D.   On: 07/11/2023 14:12     Subjective:  Still reports dyspnea, mainly with activity, but no hypoxia, ambulated 300 feet in the hallway with mobility specialist today where he remained oxygen saturation 92 to 96% on room air. Discharge Exam: Vitals:   08/02/23 0700 08/02/23 0754  BP:  (!) 142/114  Pulse: (!) 101 (!) 108  Resp: (!) 22 (!) 23  Temp:  98.4 F (36.9 C)  SpO2: 95% 97%   Vitals:   08/02/23 0445 08/02/23 0500 08/02/23 0700 08/02/23 0754  BP: (!) 152/111   (!) 142/114  Pulse: (!) 114 (!) 115 (!) 101 (!) 108  Resp: (!) 21 (!) 32 (!) 22 (!) 23  Temp: 98.7 F (37.1 C)   98.4 F (36.9 C)  TempSrc: Oral   Oral  SpO2: 97% 99% 95% 97%  Weight:      Height:        General: Pt is alert, awake, not in acute distress Cardiovascular: RRR, S1/S2 +, no rubs, no gallops Respiratory: CTA bilaterally, no wheezing, no rhonchi Abdominal: Soft, NT, ND, bowel sounds + Extremities: no edema, no cyanosis    The results of significant diagnostics from this hospitalization (including imaging, microbiology, ancillary and laboratory) are listed below for reference.     Microbiology: Recent Results (from the past 240 hours)  Resp panel by RT-PCR (RSV, Flu A&B, Covid) Anterior Nasal Swab     Status: None   Collection Time: 07/29/23  8:58 AM   Specimen: Anterior Nasal Swab  Result Value Ref Range Status   SARS Coronavirus 2 by RT PCR NEGATIVE NEGATIVE Final   Influenza A by PCR NEGATIVE NEGATIVE Final   Influenza B by PCR NEGATIVE NEGATIVE Final    Comment: (NOTE) The Xpert Xpress SARS-CoV-2/FLU/RSV plus assay is intended as an aid in the diagnosis of influenza from Nasopharyngeal swab specimens and should not be used as a sole basis for treatment. Nasal washings and aspirates are unacceptable for Xpert Xpress SARS-CoV-2/FLU/RSV testing.  Fact Sheet for  Patients: BloggerCourse.com  Fact Sheet for Healthcare Providers: SeriousBroker.it  This test is not yet approved or cleared by the [place] FDA and has been authorized for detection and/or diagnosis of SARS-CoV-2 by FDA under an Emergency Use Authorization (EUA). This EUA will remain in effect (meaning this test can be used) for the duration of the COVID-19 declaration under Section 564(b)(1) of the Act, 21 U.S.C. section 360bbb-3(b)(1), unless the authorization is terminated or revoked.  Resp Syncytial Virus by PCR NEGATIVE NEGATIVE Final    Comment: (NOTE) Fact Sheet for Patients: BloggerCourse.com  Fact Sheet for Healthcare Providers: SeriousBroker.it  This test is not yet approved or cleared by the [place] FDA and has been authorized for detection and/or diagnosis of SARS-CoV-2 by FDA under an Emergency Use Authorization (EUA). This EUA will remain in effect (meaning this test can be used) for the duration of the COVID-19 declaration under Section 564(b)(1) of the Act, 21 U.S.C. section 360bbb-3(b)(1), unless the authorization is terminated or revoked.  Performed at [facility], [address], [place], [place] [address]   Culture, blood (routine x 2)     Status: None (Preliminary result)   Collection Time: 07/29/23  9:24 AM   Specimen: BLOOD RIGHT HAND  Result Value Ref Range Status   Specimen Description BLOOD RIGHT HAND  Final   Special Requests   Final    BOTTLES DRAWN AEROBIC ONLY Blood Culture results may not be optimal due to an inadequate volume of blood received in culture bottles   Culture   Final    NO GROWTH 3 DAYS Performed at [facility], [address], [place], [place] [address]    Report Status PENDING  Incomplete  Culture, blood (routine x 2)     Status: None (Preliminary result)   Collection Time: 07/29/23  9:34 AM    Specimen: BLOOD LEFT HAND  Result Value Ref Range Status   Specimen Description BLOOD LEFT HAND  Final   Special Requests   Final    BOTTLES DRAWN AEROBIC AND ANAEROBIC Blood Culture results may not be optimal due to an inadequate volume of blood received in culture bottles   Culture   Final    NO GROWTH 3 DAYS Performed at [facility], [address] [place], [place] [address]    Report Status PENDING  Incomplete  Respiratory (~20 pathogens) panel by PCR     Status: None   Collection Time: 07/29/23  6:13 PM   Specimen: Nasopharyngeal Swab; Respiratory  Result Value Ref Range Status   Adenovirus NOT DETECTED NOT DETECTED Final   Coronavirus 229E NOT DETECTED NOT DETECTED Final    Comment: (NOTE) The Coronavirus on the Respiratory Panel, DOES NOT test for the novel  Coronavirus (2019 nCoV)    Coronavirus HKU1 NOT DETECTED NOT DETECTED Final   Coronavirus NL63 NOT DETECTED NOT DETECTED Final   Coronavirus OC43 NOT DETECTED NOT DETECTED Final   Metapneumovirus NOT DETECTED NOT DETECTED Final   Rhinovirus / Enterovirus NOT DETECTED NOT DETECTED Final   Influenza A NOT DETECTED NOT DETECTED Final   Influenza B NOT DETECTED NOT DETECTED Final   Parainfluenza Virus 1 NOT DETECTED NOT DETECTED Final   Parainfluenza Virus 2 NOT DETECTED NOT DETECTED Final   Parainfluenza Virus 3 NOT DETECTED NOT DETECTED Final   Parainfluenza Virus 4 NOT DETECTED NOT DETECTED Final   Respiratory Syncytial Virus NOT DETECTED NOT DETECTED Final   Bordetella pertussis NOT DETECTED NOT DETECTED Final   Bordetella Parapertussis NOT DETECTED NOT DETECTED Final   Chlamydophila pneumoniae NOT DETECTED NOT DETECTED Final   Mycoplasma pneumoniae NOT DETECTED NOT DETECTED Final    Comment: Performed at [facility], [address] [place], [place] [address]     Labs: BNP (last 3 results) Recent Labs    07/31/23 0512 08/01/23 0731 08/02/23 0544  BNP 205.6* 305.8* 127.9*   Basic Metabolic  Panel: Recent Labs  Lab 07/29/23 0924 07/30/23 0450 07/31/23 0512 08/01/23 0545 08/02/23 0544  NA 137 135 134* 140 136  K 4.3 4.2 3.2* 3.8 3.7  CL 105 103 105 108 105  CO2 19* 23 22 23 24   GLUCOSE 193* 272* 374* 200* 235*  BUN 10 24* 23* 17 17  CREATININE 1.19 1.62* 1.71* 1.31* 1.35*  CALCIUM 8.9 8.6* 8.0* 8.5* 8.2*  MG  --   --  2.2 2.1 2.0   Liver Function Tests: No results for input(s): "AST", "ALT", "ALKPHOS", "BILITOT", "PROT", "ALBUMIN" in the last 168 hours. No results for input(s): "LIPASE", "AMYLASE" in the last 168 hours. No results for input(s): "AMMONIA" in the last 168 hours. CBC: Recent Labs  Lab 07/29/23 0924 07/30/23 0450 07/31/23 0512 08/01/23 0545 08/02/23 0544  WBC 10.0 13.3* 9.1 7.4 6.1  NEUTROABS 8.7*  --  7.6 5.6 4.3  HGB 11.0* 9.6* 8.7* 9.5* 9.1*  HCT 35.0* 30.0* 27.4* 30.7* 28.7*  MCV 71.6* 70.8* 71.2* 73.1* 71.2*  PLT 227 231 190 191 193   Cardiac Enzymes: No results for input(s): "CKTOTAL", "CKMB", "CKMBINDEX", "TROPONINI" in the last 168 hours. BNP: Invalid input(s): "POCBNP" CBG: Recent Labs  Lab 08/01/23 0818 08/01/23 1136 08/01/23 1639 08/01/23 1915 08/02/23 0801  GLUCAP 154* 195* 219* 225* 163*   D-Dimer No results for input(s): "DDIMER" in the last 72 hours. Hgb A1c No results for input(s): "HGBA1C" in the last 72 hours. Lipid Profile No results for input(s): "CHOL", "HDL", "LDLCALC", "TRIG", "CHOLHDL", "LDLDIRECT" in the last 72 hours. Thyroid function studies Recent Labs    08/02/23 0544  TSH 1.156   Anemia work up Recent Labs    07/31/23 0512  VITAMINB12 183  FOLATE 5.9*   Urinalysis    Component Value Date/Time   COLORURINE YELLOW 06/17/2023 1205   APPEARANCEUR HAZY (A) 06/17/2023 1205   LABSPEC 1.010 06/17/2023 1205   PHURINE 5.0 06/17/2023 1205   GLUCOSEU >=500 (A) 06/17/2023 1205   HGBUR SMALL (A) 06/17/2023 1205   BILIRUBINUR NEGATIVE 06/17/2023 1205   KETONESUR NEGATIVE 06/17/2023 1205    PROTEINUR 30 (A) 06/17/2023 1205   UROBILINOGEN 0.2 03/23/2014 1600   NITRITE NEGATIVE 06/17/2023 1205   LEUKOCYTESUR NEGATIVE 06/17/2023 1205   Sepsis Labs Recent Labs  Lab 07/30/23 0450 07/31/23 0512 08/01/23 0545 08/02/23 0544  WBC 13.3* 9.1 7.4 6.1   Microbiology Recent Results (from the past 240 hours)  Resp panel by RT-PCR (RSV, Flu A&B, Covid) Anterior Nasal Swab     Status: None   Collection Time: 07/29/23  8:58 AM   Specimen: Anterior Nasal Swab  Result Value Ref Range Status   SARS Coronavirus 2 by RT PCR NEGATIVE NEGATIVE Final   Influenza A by PCR NEGATIVE NEGATIVE Final   Influenza B by PCR NEGATIVE NEGATIVE Final    Comment: (NOTE) The Xpert Xpress SARS-CoV-2/FLU/RSV plus assay is intended as an aid in the diagnosis of influenza from Nasopharyngeal swab specimens and should not be used as a sole basis for treatment. Nasal washings and aspirates are unacceptable for Xpert Xpress SARS-CoV-2/FLU/RSV testing.  Fact Sheet for Patients: BloggerCourse.com  Fact Sheet for Healthcare Providers: SeriousBroker.it  This test is not yet approved or cleared by the [place] FDA and has been authorized for detection and/or diagnosis of SARS-CoV-2 by FDA under an Emergency Use Authorization (EUA). This EUA will remain in effect (meaning this test can be used) for the duration of the COVID-19 declaration under Section 564(b)(1) of the Act, 21 U.S.C. section 360bbb-3(b)(1),  unless the authorization is terminated or revoked.     Resp Syncytial Virus by PCR NEGATIVE NEGATIVE Final    Comment: (NOTE) Fact Sheet for Patients: BloggerCourse.com  Fact Sheet for Healthcare Providers: SeriousBroker.it  This test is not yet approved or cleared by the [place] FDA and has been authorized for detection and/or diagnosis of SARS-CoV-2 by FDA under an Emergency Use  Authorization (EUA). This EUA will remain in effect (meaning this test can be used) for the duration of the COVID-19 declaration under Section 564(b)(1) of the Act, 21 U.S.C. section 360bbb-3(b)(1), unless the authorization is terminated or revoked.  Performed at [facility], [address], [place], [place] [address]   Culture, blood (routine x 2)     Status: None (Preliminary result)   Collection Time: 07/29/23  9:24 AM   Specimen: BLOOD RIGHT HAND  Result Value Ref Range Status   Specimen Description BLOOD RIGHT HAND  Final   Special Requests   Final    BOTTLES DRAWN AEROBIC ONLY Blood Culture results may not be optimal due to an inadequate volume of blood received in culture bottles   Culture   Final    NO GROWTH 3 DAYS Performed at [facility], [address] [place], [place] [address]    Report Status PENDING  Incomplete  Culture, blood (routine x 2)     Status: None (Preliminary result)   Collection Time: 07/29/23  9:34 AM   Specimen: BLOOD LEFT HAND  Result Value Ref Range Status   Specimen Description BLOOD LEFT HAND  Final   Special Requests   Final    BOTTLES DRAWN AEROBIC AND ANAEROBIC Blood Culture results may not be optimal due to an inadequate volume of blood received in culture bottles   Culture   Final    NO GROWTH 3 DAYS Performed at [facility], [address], [place], [place] [address]    Report Status PENDING  Incomplete  Respiratory (~20 pathogens) panel by PCR     Status: None   Collection Time: 07/29/23  6:13 PM   Specimen: Nasopharyngeal Swab; Respiratory  Result Value Ref Range Status   Adenovirus NOT DETECTED NOT DETECTED Final   Coronavirus 229E NOT DETECTED NOT DETECTED Final    Comment: (NOTE) The Coronavirus on the Respiratory Panel, DOES NOT test for the novel  Coronavirus (2019 nCoV)    Coronavirus HKU1 NOT DETECTED NOT DETECTED Final   Coronavirus NL63 NOT DETECTED NOT DETECTED Final   Coronavirus OC43 NOT DETECTED  NOT DETECTED Final   Metapneumovirus NOT DETECTED NOT DETECTED Final   Rhinovirus / Enterovirus NOT DETECTED NOT DETECTED Final   Influenza A NOT DETECTED NOT DETECTED Final   Influenza B NOT DETECTED NOT DETECTED Final   Parainfluenza Virus 1 NOT DETECTED NOT DETECTED Final   Parainfluenza Virus 2 NOT DETECTED NOT DETECTED Final   Parainfluenza Virus 3 NOT DETECTED NOT DETECTED Final   Parainfluenza Virus 4 NOT DETECTED NOT DETECTED Final   Respiratory Syncytial Virus NOT DETECTED NOT DETECTED Final   Bordetella pertussis NOT DETECTED NOT DETECTED Final   Bordetella Parapertussis NOT DETECTED NOT DETECTED Final   Chlamydophila pneumoniae NOT DETECTED NOT DETECTED Final   Mycoplasma pneumoniae NOT DETECTED NOT DETECTED Final    Comment: Performed at [facility], [address] [place], [place] [address]     Time coordinating discharge: Over 30 minutes  SIGNED:   Huey Bienenstock, MD  [facility] 08/02/2023, 11:02  AM Pager   If 7PM-7AM, please contact night-coverage [email or web address]

## 2023-08-02 NOTE — Progress Notes (Signed)
 Heart Failure Stewardship Pharmacist Progress Note   PCP: Dulce Sellar, NP PCP-Cardiologist: None    HPI:  48 y.o. male with PMHx of T2DM (history of DKA), chronic diastolic CHF, benign spinal cord injury (s/p resection in 2015), and 2 hospitalizations for ARDS requiring mechanical ventilation. Patient recently admitted from January 2025 for respiratory failure secondary to influenza.   Pt was admitted to [facility] ED on 03/03 for shortness of breath while at work. He admitted to ongoing exertional dyspnea since discharge in January 2025, but this would usually resolve with rest. This most recent episode at work did not resolve with rest, so he elected to call EMS. While en route to ED O2 sat was 88% on room air for which duonebs were given. ED was concerned for ARDS and CAP. While in ED shortness of breath improved with nebulizer, methylprednisolone 125 mg IV, and placement of 2L nasal cannula. For concern for CAP he was given Ceftriaxone 1 gram IVPB and Azithromycin 250 mg IVPB. Respiratory panel was negative. BNP elevated at 297. CXR 03/03 revealed hazy diffuse parenchymal and interstitial opacity seen throughout the left hemithorax. More mild changes on the right. CT angiogram of chest on 03/03 revealed Severely elevated right hemidiaphragm. Tiny pleural effusions. Multifocal scattered ground-glass type lung infiltrative opacities. These have a different configuration in the opacities seen previously in November 2024. No signs of pulmonary embolism. Repeat CXR 03/04 revealed significant improvement in bilateral airspace disease, with scattered airspace disease remaining with a basal gradient. Opacities less widespread compared to yesterday's reading indicating improving fluid balance/edema and/or improving pneumonia. ECHO from 03/04 revealed LVEF 55-60% (ECHO from October 2024 revealed LVEF 50-55%), no regional wall motion abnormalities of LV, unable to assess PA pressure, no evidence of mitral or  aortic stenosis, right atrial pressure of 3 mmHg.  Patient is reporting no shortness of breath but still unable to lay flat without difficulty breathing. No LE edema on exam. Planning for discharge today but patient states he feels nervous about this given the fluid still in his chest and thinks he should remain in the hospital. Discussed with patient about discharge lasix dose being double the dose he received yesterday and he has follow up scheduled with the HF clinic on Tuesday. Advised to call the clinic on Monday if he feels he needs to be seen sooner. Patient states he feels more comfortable knowing the changes and quick follow up. Medicaid status still pending.   Current HF Medications: Diuretic: furosemide 40 mg PO daily Beta Blocker: Carvedilol 25 mg BID  Other: Isosorbide-Hydralazine 20/37.5 mg TID   Prior to admission HF Medications: Diuretic: Furosemide 80 mg daily (last dispensed 04/08/2023 for a 30 day-supply) Beta blocker: Carvedilol 12.5 mg BID AC (last dispensed 04/08/2023 for a 30 day supply)   Pertinent Lab Values: Serum creatinine 1.35, BUN 17, Potassium 3.7, Sodium 136, BNP  205.6 (338.9 at admission), Magnesium 2.0, A1c 6.8% (March 2025)  Vital Signs: Weight: 194 lbs (admission weight: 179.9 lbs) --> bed weights Blood pressure: 150/100s Heart rate: 100-110s I/O: net -2.3 L yesterday; net -5.8 L since admission  Medication Assistance / Insurance Benefits Check: Does the patient have prescription insurance?  Pending Medicaid application submitted Type of insurance plan: None  Outpatient Pharmacy:  Prior to admission outpatient pharmacy:  [facility] [place] - [address]   Is the patient willing to use [facility] TOC pharmacy at discharge? Yes Is the patient willing to transition their outpatient pharmacy to  utilize a [employer] outpatient pharmacy?   Yes.   Assessment: 1. Acute on chronic diastolic CHF (LVEF 55-60%), due to  presumed NICM. NYHA class II symptoms. - IV lasix stopped for AKI. Transitioned to furosemide 40 mg daily on 3/6. Plan discharge for 80 mg daily. Strict I/Os and daily weights. Keep K>4 and Mg>2. -Continue carvedilol 25 mg BID -Consider adding spironolactone 25 mg daily at follow up -Continue BiDil 20/37.5 mg TID - Medicaid is not active yet. Consider transitioning to hydralazine 50 mg TID + Imdur 30 mg daily at discharge.    Plan: 1) Medication changes recommended at this time: -Add spironolactone 25 mg daily at follow up -Transition BiDil to hydralazine 50 mg TID + Imdur 30 mg daily at discharge - Increase furosemide to 80 mg daily for discharge  2) Patient assistance: -Medicaid application pending   3)  Education  - Patient has been educated on current HF medications and potential additions to HF medication regimen - Patient verbalizes understanding that over the next few months, these medication doses may change and more medications may be added to optimize HF regimen - Patient has been educated on basic disease state pathophysiology and goals of therapy    Sharen Hones, PharmD, BCPS Heart Failure Stewardship Pharmacist Phone [phone]

## 2023-08-02 NOTE — TOC Transition Note (Signed)
 Transition of Care [facility]) - Discharge Note   Patient Details  Name: Gary Conway MRN: [id number] Date of Birth: Jul 15, 1975  Transition of Care [facility]) CM/SW Contact:  Lockie Pares, RN Phone Number: 08/02/2023, 10:20 AM   Clinical Narrative:    Patient discharging today MATCH medication assistance done, patient uninsured MATCH MEDICATION ASSISTANCE CARD Pharmacies please call: [phone] for claim processing assistance.  Rx BIN: [id number] Rx Group: [id number] Rx PCN: [id number] Relationship Code: 1 person Code: [id number]  Patient ID (MRN): Gary Conway [id number]    Patient Name Gary Conway      Patient DOB: 04/28/1976    Discharge Date: 03/07//2025    Expiration Date: 08/11/2023 (must be filled within 7 days of discharge)  Dear  Gary Conway You have been approved to have the prescriptions written by your discharging physician filled through our [facility] (Medication Assistance Through [facility]) Conway. This Conway allows for a one-time (no refills) 34-day supply of selected medications for a low copay amount.  The copay is $3.00 per prescription. For instance, if you have one prescription, you will pay $3.00; for two prescriptions, you pay $6.00; for three prescriptions, you pay $9.00; and so on. Only certain pharmacies are participating in this Conway with [facility]. You will need to select one of the pharmacies from the attached lists and take your prescriptions, this letter, and your photo ID to one of the participating pharmacies.  We are excited that you are able to use the [facility] Conway to get your medications. These prescriptions must be filled within 7 days of hospital discharge or they will no longer be valid for the [facility] Conway. Should you have any problems with your prescriptions please contact your case management team member at [phone] for [place] or [phone] for [facility].  Thank you, Gary Conway  [facility] [place].  [employer] [facility]  [facility] Pharmacies [facility] (continued) [facility] (continue [address] [place]  [address] [place], [place] [address], [employer] [address] [place], [place] [address] [place] [address] [place], [address], [place] Transitions of Care - [facility] [address] [place] [facility]S [address], [place] [address] [facility] [address], [place] [address], [place] [address], [place] [address] [place] [address], [place], [address] [place], [place] [address] [place] [address] [place], [place] [address] [place] [place] [address] [place], [place] [address] [place], [place] [address] [place], [place] [address], [place] [address] [place], [place] [address] [place], [place] [address], [facility] [address], [place] [address], [place] [address]00 [place], [place] [address]5 [place], [place] [address] [place], [place] [address], [place] [address], [place] [address], [place] [address], [place], [place] [address] [place], [place] [address], [address] [place], [place] [address] [place] [address] [place] [address], [place] [facility] [address], [place] [address],  [address] [place], [place] [address] [place], [place] [address]3 [place], [place] [address] [place], [place] [address]01 [place], [place] [address], [place] [address]703 [place], [place]  [address] [place] [address], [facility] [address], [place] 1478 [place], [place] [address] [place], [place] 2130 [place], [place] [address] [place], [place] [address], [place] [address] [place], [place] [address] [place], [place] [address] [place], [place] [address] [place] [address], [place] [address], [place] [address] [place] [address] [place], [place] [employer] [address] [place] [address], [place] [address] [place], [place] [address] [place], [place] [address] [place], [place] [address], [place] 3605 [place], [place] [address] [place], [place] [address] [place], [place] [address] [place], [place] [address] [place], [place] [address], [place] [address] [place] [address] [place] [address],  Other [facility] [address] [place], [place] [facility] [place] [address] [facility] [address], [place]   Final next level of care: Home/Self Care Barriers to Discharge: No Barriers Identified   Patient Goals and CMS Choice Patient states their goals for this hospitalization and ongoing recovery are:: Return home          Discharge Placement                       Discharge Plan and Services Additional resources added to the After Visit Summary for     Discharge Planning Services: CM Consult            DME Arranged: N/A           HH Agency: NA        Social Drivers of Conway (SDOH) Interventions SDOH Screenings   Food Insecurity: No Food Insecurity (07/29/2023)  Housing: Low Risk  (07/30/2023)  Recent Concern: Housing - High Risk (07/29/2023)  Transportation Needs: No Transportation Needs (07/29/2023)  Utilities: Not At Risk (07/29/2023)  Alcohol Screen: Low Risk  (07/30/2023)  Financial Resource Strain: High Risk (07/30/2023)  Tobacco Use: Low Risk  (07/29/2023)     Readmission Risk Interventions    06/24/2023    4:16 PM  Readmission Risk Prevention Plan   Transportation Screening Complete  PCP or Specialist Appt within 5-7 Days Complete  Home Care Screening Complete  Medication Review (RN CM) Referral to Pharmacy

## 2023-08-02 NOTE — Plan of Care (Signed)

## 2023-08-02 NOTE — Progress Notes (Addendum)
   08/02/23 1000  Mobility  Activity Ambulated independently in hallway  Level of Assistance Standby assist, set-up cues, supervision of patient - no hands on  Assistive Device None  Distance Ambulated (ft) 300 ft  Activity Response Tolerated fair  Mobility Referral Yes  Mobility visit 1 Mobility  Mobility Specialist Start Time (ACUTE ONLY) 0840  Mobility Specialist Stop Time (ACUTE ONLY) 0847  Mobility Specialist Time Calculation (min) (ACUTE ONLY) 7 min   Mobility Specialist: Progress Note  During Mobility: HR 125-130, SpO2 92-96% RA  Pt agreeable to mobility session - received in bed. Pt with cough and expressed his concerns about his SOB and trouble catching his breath in the mornings and during the night when he wakes up. MD, RN, RRT notified via secure chat.  Returned to EOB with all needs met - call bell within reach.   Barnie Mort, BS Mobility Specialist Please contact via SecureChat or  Rehab office at [phone].

## 2023-08-03 LAB — CULTURE, BLOOD (ROUTINE X 2)
Culture: NO GROWTH
Culture: NO GROWTH

## 2023-08-05 NOTE — Progress Notes (Incomplete)
 HEART & VASCULAR TRANSITION OF CARE CONSULT NOTE    Referring Physician: Dulce Sellar, NP   Chief Complaint: Chronic HFpEF  HPI: Referred to clinic by chronic diastolic HF for heart failure consultation.   Gary Conway is a 48 y.o. male with chronic diastolic heart failure, HTN, benign spinal cord tumor (s/p resection 2015), ARDS requiring ventilation (10/24 and 1/25), DM and hx of DAH (11/24).   10/25 admitted with DKA, bilateral PNA 2/2 CAP> ARDS. Intubated. Diuresed and weaned off steroids. Was able to be extubated. Discharged with close pulmonary follow up.   Last admission was 3/25 for SOB. Found to have acute hypoxic respiratory failure and multifocal PNA d/t legionella. Completed 7 days of erythromycin. He also had HFpEF exacerbation and was diuresed with IV lasix at this time.  Echo showed EF 55-60%, LV with no RWMA, RV normal, trivial MR.   Today he presented for AHF [facility] clinic visit. Overall feeling ok. Denies palpitations, CP, dizziness, edema, or PND/Orthopnea. SOB with activity, now gets winded with short distances. Previously walked miles at a time with no issues. He was able to lay flat for the first last night. Appetite ok. No fever or chills. Does not weight at home. Taking all medications. Denies ETOH, tobacco or drug use. Drinks on the weekends when he goes out. Works at [employer]. Eats a lot of canned soups and grilled chicken at home. Constantly drinks water. Grandmother and aunt (on moms side) and father had CHF.    Past Medical History:  Diagnosis Date   Diabetes mellitus, type II (HCC) 2015   Diagnosed during 2015 admission to [facility]   Hemangioma of spine 2015   S/P excision at [facility] with pathology favoring benign Hemangioma    Current Outpatient Medications  Medication Sig Dispense Refill   albuterol (VENTOLIN HFA) 108 (90 Base) MCG/ACT inhaler Inhale 2 puffs into the lungs every 6 (six) hours as needed for wheezing or shortness of  breath. 6.7 g 0   azithromycin (ZITHROMAX) 500 MG tablet Take 1 tablet (500 mg total) by mouth daily. 3 tablet 0   blood glucose meter kit and supplies KIT Dispense based on patient and insurance preference. Use up to four times daily as directed. (FOR ICD-9 250.00, 250.01). For QAC - HS accuchecks. 1 each 1   Blood Glucose Monitoring Suppl (BLOOD GLUCOSE MONITOR SYSTEM) w/Device KIT Use to test blood sugar in the morning, at noon, and at bedtime. 1 kit 0   carvedilol (COREG) 25 MG tablet Take 1 tablet (25 mg total) by mouth 2 (two) times daily with a meal. 30 tablet 0   cyanocobalamin (VITAMIN B12) 1000 MCG tablet Take 1 tablet (1,000 mcg total) by mouth daily. 30 tablet 0   ferrous sulfate 325 (65 FE) MG tablet Take 1 tablet (325 mg total) by mouth daily with breakfast. 30 tablet 0   folic acid (FOLVITE) 1 MG tablet Take 1 tablet (1 mg total) by mouth daily. 30 tablet 0   furosemide (LASIX) 80 MG tablet Take 1 tablet (80 mg total) by mouth daily. 30 tablet 0   hydrALAZINE (APRESOLINE) 50 MG tablet Take 1 tablet (50 mg total) by mouth 3 (three) times daily. 90 tablet 0   insulin glargine (LANTUS SOLOSTAR) 100 UNIT/ML Solostar Pen Inject 30 Units into the skin daily. 9 mL 0   Insulin Pen Needle 32G X 4 MM MISC For 4 times a day insulin Subcutaneously. 100 each 0   isosorbide mononitrate (IMDUR)  30 MG 24 hr tablet Take 1 tablet (30 mg total) by mouth daily. 30 tablet 0   mometasone-formoterol (DULERA) 200-5 MCG/ACT AERO Inhale 2 puffs into the lungs 2 (two) times daily. 13 g 0   No current facility-administered medications for this encounter.    Allergies  Allergen Reactions   Food Hives, Itching and Rash    Mayonnaise - itching, rash, hives   Mustard Hives, Itching and Rash     Social History   Socioeconomic History   Marital status: Single    Spouse name: Not on file   Number of children: 0   Years of education: Not on file   Highest education level: High school graduate   Occupational History   Not on file  Tobacco Use   Smoking status: Never   Smokeless tobacco: Never  Vaping Use   Vaping status: Never Used  Substance and Sexual Activity   Alcohol use: Yes    Comment: social   Drug use: No   Sexual activity: Not Currently  Other Topics Concern   Not on file  Social History Narrative   Not on file   Social Drivers of Health   Financial Resource Strain: High Risk (07/30/2023)   Overall Financial Resource Strain (CARDIA)    Difficulty of Paying Living Expenses: Hard  Food Insecurity: No Food Insecurity (07/29/2023)   Hunger Vital Sign    Worried About Running Out of Food in the Last Year: Never true    Ran Out of Food in the Last Year: Never true  Transportation Needs: No Transportation Needs (07/29/2023)   PRAPARE - Administrator, Civil Service (Medical): No    Lack of Transportation (Non-Medical): No  Physical Activity: Not on file  Stress: Not on file  Social Connections: Not on file  Intimate Partner Violence: Not At Risk (07/29/2023)   Humiliation, Afraid, Rape, and Kick questionnaire    Fear of Current or Ex-Partner: No    Emotionally Abused: No    Physically Abused: No    Sexually Abused: No    Family History  Problem Relation Age of Onset   Breast cancer Mother    Vitals:   08/06/23 1419  BP: 110/64  Pulse: (!) 104  SpO2: 98%  Weight: 87.4 kg (192 lb 9.6 oz)  Height: 5\' 8"  (1.727 m)   PHYSICAL EXAM: General:  well appearing.  No respiratory difficulty HEENT: normal Neck: supple. JVD flat.  Cor: PMI nondisplaced. Regular rate & rhythm. No rubs, gallops or murmurs. Lungs: clear, diminished BLL Extremities: no cyanosis, clubbing, rash, edema  Neuro: alert & oriented x 3. Moves all 4 extremities w/o difficulty. Affect pleasant.   Wt Readings from Last 3 Encounters:  08/06/23 87.4 kg (192 lb 9.6 oz)  08/01/23 88.3 kg (194 lb 10.7 oz)  07/11/23 83.6 kg (184 lb 3.2 oz)    ECG: ST 103 bpm (Personally reviewed)     ASSESSMENT & PLAN: Chronic diastolic heart failure - Echo 8/29: EF 55-60%, LV with no RWMA, RV normal, trivial MR.  NYHA IIIa, limited by deconditioning 2/2 recent intubation periods GDMT  Diuretic- Continue lasix 80 mg daily BB- Continue coreg 25 mg BID  Ace/ARB/ARNI- Continue hydral/Imdur. Plan for eventual Entresto MRA - Start spiro 12.5 mg daily. BMET/BNP today. Repeat BMET 1 week.  SGLT2i- will hold off on SGLT2i at this time with history of DKA - With history of CHF in family may benefit from [facility] down the road.  - Discussed cutting back  on canned soup and fluid intake.   Chronic hypoxic respiratory failure - Patient with hx of ARDS 10/24 and 1/25 requiring prolonged intubation - Later presented again with Influenza - Also with hx of DAH during last bronch - Follows with pulmonology OP  HTN - BP stable today - Continue hydral/Imdur/ Coreg - Plan to stop hydral/imdur and switch to Entresto at next visit if renal function is stable  CKD - SCr 3/25 last 1.35 - BMET today  DM II - Hgb A1c 6.8 3/25  Provided patient with scale and pill box today.   Referred to HFSW (PCP, Medications, Transportation, ETOH Abuse, Drug Abuse, Insurance, Financial ):  No Refer to Pharmacy:  No Refer to Home Health:  No Refer to Advanced Heart Failure Clinic: Yes or no  Refer to General Cardiology: Yes or No  Will reevaluate at [facility] f/u. Plan to start [facility]. Follow up in 3 weeks to try to add more GDMT. Would do ReDS clip at next visit as volume is difficult to gauge on him.

## 2023-08-06 ENCOUNTER — Ambulatory Visit (HOSPITAL_COMMUNITY)
Admit: 2023-08-06 | Discharge: 2023-08-06 | Disposition: A | Discharge status: Home / Self Care | Payer: MEDICAID | Source: Ambulatory Visit | Attending: Internal Medicine | Admitting: Internal Medicine

## 2023-08-06 ENCOUNTER — Other Ambulatory Visit (HOSPITAL_COMMUNITY): Payer: Self-pay

## 2023-08-06 ENCOUNTER — Encounter (HOSPITAL_COMMUNITY): Payer: Self-pay

## 2023-08-06 ENCOUNTER — Other Ambulatory Visit: Payer: Self-pay

## 2023-08-06 VITALS — BP 110/64 | HR 104 | Ht 68.0 in | Wt 192.6 lb

## 2023-08-06 DIAGNOSIS — N189 Chronic kidney disease, unspecified: Secondary | ICD-10-CM | POA: Insufficient documentation

## 2023-08-06 DIAGNOSIS — I5032 Chronic diastolic (congestive) heart failure: Secondary | ICD-10-CM

## 2023-08-06 DIAGNOSIS — Z79899 Other long term (current) drug therapy: Secondary | ICD-10-CM | POA: Insufficient documentation

## 2023-08-06 DIAGNOSIS — I5033 Acute on chronic diastolic (congestive) heart failure: Secondary | ICD-10-CM | POA: Insufficient documentation

## 2023-08-06 DIAGNOSIS — E1165 Type 2 diabetes mellitus with hyperglycemia: Secondary | ICD-10-CM

## 2023-08-06 DIAGNOSIS — N182 Chronic kidney disease, stage 2 (mild): Secondary | ICD-10-CM

## 2023-08-06 DIAGNOSIS — Z794 Long term (current) use of insulin: Secondary | ICD-10-CM | POA: Insufficient documentation

## 2023-08-06 DIAGNOSIS — J9611 Chronic respiratory failure with hypoxia: Secondary | ICD-10-CM | POA: Insufficient documentation

## 2023-08-06 DIAGNOSIS — I10 Essential (primary) hypertension: Secondary | ICD-10-CM

## 2023-08-06 DIAGNOSIS — Z8249 Family history of ischemic heart disease and other diseases of the circulatory system: Secondary | ICD-10-CM | POA: Insufficient documentation

## 2023-08-06 DIAGNOSIS — E1122 Type 2 diabetes mellitus with diabetic chronic kidney disease: Secondary | ICD-10-CM | POA: Insufficient documentation

## 2023-08-06 DIAGNOSIS — I130 Hypertensive heart and chronic kidney disease with heart failure and stage 1 through stage 4 chronic kidney disease, or unspecified chronic kidney disease: Secondary | ICD-10-CM | POA: Insufficient documentation

## 2023-08-06 LAB — BASIC METABOLIC PANEL
Anion gap: 13 (ref 5–15)
BUN: 12 mg/dL (ref 6–20)
CO2: 21 mmol/L — ABNORMAL LOW (ref 22–32)
Calcium: 8.6 mg/dL — ABNORMAL LOW (ref 8.9–10.3)
Chloride: 101 mmol/L (ref 98–111)
Creatinine, Ser: 1.48 mg/dL — ABNORMAL HIGH (ref 0.61–1.24)
GFR, Estimated: 58 mL/min — ABNORMAL LOW (ref >60)
Glucose, Bld: 291 mg/dL — ABNORMAL HIGH (ref 70–99)
Potassium: 3.9 mmol/L (ref 3.5–5.1)
Sodium: 135 mmol/L (ref 135–145)

## 2023-08-06 LAB — BRAIN NATRIURETIC PEPTIDE: B Natriuretic Peptide: 133.8 pg/mL — ABNORMAL HIGH (ref 0.0–100.0)

## 2023-08-06 MED ORDER — SPIRONOLACTONE 25 MG PO TABS: 12.5000 mg | ORAL_TABLET | Freq: Every day | ORAL | 3 refills | Status: DC

## 2023-08-06 NOTE — Patient Instructions (Addendum)
 Medication Changes:  START SPIRONOLACTONE 12.5mg  once daily   Lab Work:  Labs done today, your results will be available in MyChart, we will contact you for abnormal readings.  THEN PLEASE RETURN FOR LABS AS SCHEDULED IN 7- 10 DAYS   Follow-Up in: 3 WEEKS AS SCHEDULED WITH TOC CLINIC  At the Advanced Heart Failure Clinic, you and your health needs are our priority. We have a designated team specialized in the treatment of Heart Failure. This Care Team includes your primary Heart Failure Specialized Cardiologist (physician), Advanced Practice Providers (APPs- Physician Assistants and Nurse Practitioners), and Pharmacist who all work together to provide you with the care you need, when you need it.   You may see any of the following providers on your designated Care Team at your next follow up:  Dr. Arvilla Meres Dr. Marca Ancona Dr. Dorthula Nettles Dr. Theresia Bough Tonye Becket, NP Robbie Lis, [place] [facility] [place], [place] Brynda Peon, NP [place] Lee, NP Karle Plumber, PharmD   Please be sure to bring in all your medications bottles to every appointment.   Need to Contact us:  If you have any questions or concerns before your next appointment please send [place] a message through [employer] or call our office at [phone].    TO LEAVE A MESSAGE FOR THE NURSE SELECT OPTION 2, PLEASE LEAVE A MESSAGE INCLUDING: YOUR NAME DATE OF BIRTH CALL BACK NUMBER REASON FOR CALL**this is important as we prioritize the call backs  YOU WILL RECEIVE A CALL BACK THE SAME DAY AS LONG AS YOU CALL BEFORE 4:00 PM

## 2023-08-06 NOTE — Progress Notes (Signed)
 Heart and Vascular Center Transitions of Care Clinic Heart Failure Pharmacist Encounter  PCP: Dulce Sellar, NP PCP-Cardiologist: None  HPI:   48 y.o. male with PMHx of T2DM (history of DKA), chronic diastolic CHF, benign spinal cord injury (s/p resection in 2015), and 2 hospitalizations for ARDS requiring mechanical ventilation. Patient recently admitted from January 2025 for respiratory failure secondary to influenza.   Pt was admitted to [facility] ED on 03/03 for shortness of breath while at work. He admitted to ongoing exertional dyspnea since discharge in January 2025, but this would usually resolve with rest. This most recent episode at work did not resolve with rest, so he elected to call EMS. While en route to ED O2 sat was 88% on room air for which duonebs were given. ED was concerned for ARDS and CAP. While in ED shortness of breath improved with nebulizer, methylprednisolone 125 mg IV, and placement of 2L nasal cannula. For concern for CAP he was given Ceftriaxone 1 gram IVPB and Azithromycin 250 mg IVPB. Respiratory panel was negative. BNP elevated at 297. CXR 03/03 revealed hazy diffuse parenchymal and interstitial opacity seen throughout the left hemithorax. More mild changes on the right. CT angiogram of chest on 03/03 revealed Severely elevated right hemidiaphragm. Tiny pleural effusions. Multifocal scattered ground-glass type lung infiltrative opacities. These have a different configuration in the opacities seen previously in November 2024. No signs of pulmonary embolism. Repeat CXR 03/04 revealed significant improvement in bilateral airspace disease, with scattered airspace disease remaining with a basal gradient. Opacities less widespread compared to yesterday's reading indicating improving fluid balance/edema and/or improving pneumonia. ECHO from 03/04 revealed LVEF 55-60% (ECHO from October 2024 revealed LVEF 50-55%), no regional wall motion abnormalities of LV, unable to assess PA  pressure, no evidence of mitral or aortic stenosis, right atrial pressure of 3 mmHg.   Prior to discharge patient expressed concerns over returning home due to residual fluid in his chest. He was reassured that with doubling of lasix dose prior to discharge he should be stable to return home. He was encouraged to call the clinic on Monday if he felt he needed to be seen sooner. He was discharged on Carvedilol 25 mg BID, furosemide 80 mg every day, Imdur 30 mg every day, and Hydralazine 50 mg TID all of which were picked up from [facility] pharmacy at [facility].   Today, Gary Conway presents to the Heart Failure TOC Clinic for follow up. He reported no orthopnea. He has noticed DOE when walking short distances. He has been adherent to his medications. He does not have a pill box at home or a functional scale. His appetite is okay. He consumes mostly grilled chicken and canned soups. He tries to make sure the soups are low in sodium. He denied palpitations. He has a family history of heart failure in his grandmother, aunt, and possibly father. He has not yet heard if he has been approved for Medicaid. He is agreeable to start Spironolactone.    HF Medications: Diuretic: Furosemide 80 mg QD Beta Blocker: Carvedilol 25 mg BID Other: Imdur 30 mg every day + Hydralazine 50 mg TID  Has the patient been experiencing any side effects to the medications prescribed?  no  Does the patient have any problems obtaining medications due to transportation or finances?   no  Understanding of regimen: good Understanding of indications: good Potential of compliance: good Patient understands to avoid NSAIDs. Patient understands to avoid decongestants.   Pertinent Lab Values: Serum creatinine 1.35,  BUN 17, Potassium 3.7, Sodium 136, BNP 305.8, Magnesium 2  Vital Signs: Weight: 192.6 lbs (discharge weight: 194 lbs) Blood pressure: 110/64  Heart rate: 104   Medication Assistance / Insurance Benefits Check: Does the  patient have prescription insurance?  Pending Type of insurance plan: [place] Medicaid application awaiting approval    Outpatient Pharmacy:  Current outpatient pharmacy: [facility] [address] Was the [facility] pharmacy used to supply discharge medications? yes  If TOC pharmacy was used, were the refills transferred out to current pharmacy yet? no  Is the patient willing to transition their outpatient pharmacy to utilize a [facility] outpatient pharmacy with or without mail order?   Yes  Assessment: 1) Chronic diastolic HFpEF (EF 55-60%). NYHA class II symptoms. - Continue daily weights and low salt diet -Continue Furosemide 80 mg QD -Continue Carvedilol 25 mg BID -Continue Imdur 30 mg every day and Hydralazine 50 mg TID -Repeat BMP and BNP ordered today. Results pending.  -He will be given a pill box and new scale today  Plan: 1) Medication changes: - Start Spironolactone 12.5 mg daily  2) Patient Assistance: -Medicaid application still processing. Unable to find any information on [place] -Will continue to follow insurance status -Spironolactone on $5 drug list at [facility]   3) Follow up: -Repeat BMP for new start Spironolactone appointment on 08/16/2023 - Next appointment with TOC on 08/27/2023  Sofie Rower, PharmD [facility] PGY-1

## 2023-08-15 NOTE — Progress Notes (Incomplete)
 HEART IMPACT TRANSITIONS OF CARE    PCP: Dulce Sellar, NP   Chief Complaint: HFpEF  HPI: Referred to clinic by chronic diastolic HF for heart failure consultation.    Gary Conway is a 48 y.o. male with chronic diastolic heart failure, HTN, benign spinal cord tumor (s/p resection 2015), ARDS requiring ventilation (10/24 and 1/25), DM and hx of DAH (11/24).    10/25 admitted with DKA, bilateral PNA 2/2 CAP> ARDS. Intubated. Diuresed and weaned off steroids. Was able to be extubated. Discharged with close pulmonary follow up.    Last admission was 3/25 for SOB. Found to have acute hypoxic respiratory failure and multifocal PNA d/t legionella. Completed 7 days of erythromycin. He also had HFpEF exacerbation and was diuresed with IV lasix at this time.  Echo showed EF 55-60%, LV with no RWMA, RV normal, trivial MR.   Today he returns for [facility] follow up. Overall feeling ***. Denies palpitations, CP, dizziness, edema, or PND/Orthopnea. *** SOB. Appetite ok. No fever or chills. Weight at home *** pounds. Taking all medications. Denies ETOH, tobacco or drug use.    ROS: All systems negative except as listed in HPI, PMH and Problem List.  SH:  Social History   Socioeconomic History   Marital status: Single    Spouse name: Not on file   Number of children: 0   Years of education: Not on file   Highest education level: High school graduate  Occupational History   Not on file  Tobacco Use   Smoking status: Never   Smokeless tobacco: Never  Vaping Use   Vaping status: Never Used  Substance and Sexual Activity   Alcohol use: Yes    Comment: social   Drug use: No   Sexual activity: Not Currently  Other Topics Concern   Not on file  Social History Narrative   Not on file   Social Drivers of Health   Financial Resource Strain: High Risk (07/30/2023)   Overall Financial Resource Strain (CARDIA)    Difficulty of Paying Living Expenses: Hard  Food Insecurity: No Food  Insecurity (07/29/2023)   Hunger Vital Sign    Worried About Running Out of Food in the Last Year: Never true    Ran Out of Food in the Last Year: Never true  Transportation Needs: No Transportation Needs (07/29/2023)   PRAPARE - Administrator, Civil Service (Medical): No    Lack of Transportation (Non-Medical): No  Physical Activity: Not on file  Stress: Not on file  Social Connections: Not on file  Intimate Partner Violence: Not At Risk (07/29/2023)   Humiliation, Afraid, Rape, and Kick questionnaire    Fear of Current or Ex-Partner: No    Emotionally Abused: No    Physically Abused: No    Sexually Abused: No    FH:  Family History  Problem Relation Age of Onset   Breast cancer Mother     Past Medical History:  Diagnosis Date   Diabetes mellitus, type II (HCC) 2015   Diagnosed during 2015 admission to [facility]   Hemangioma of spine 2015   S/P excision at [facility] with pathology favoring benign Hemangioma    Current Outpatient Medications  Medication Sig Dispense Refill   albuterol (VENTOLIN HFA) 108 (90 Base) MCG/ACT inhaler Inhale 2 puffs into the lungs every 6 (six) hours as needed for wheezing or shortness of breath. 6.7 g 0   azithromycin (ZITHROMAX) 500 MG tablet Take 1 tablet (500 mg total)  by mouth daily. 3 tablet 0   blood glucose meter kit and supplies KIT Dispense based on patient and insurance preference. Use up to four times daily as directed. (FOR ICD-9 250.00, 250.01). For QAC - HS accuchecks. 1 each 1   Blood Glucose Monitoring Suppl (BLOOD GLUCOSE MONITOR SYSTEM) w/Device KIT Use to test blood sugar in the morning, at noon, and at bedtime. 1 kit 0   carvedilol (COREG) 25 MG tablet Take 1 tablet (25 mg total) by mouth 2 (two) times daily with a meal. 30 tablet 0   cyanocobalamin (VITAMIN B12) 1000 MCG tablet Take 1 tablet (1,000 mcg total) by mouth daily. 30 tablet 0   ferrous sulfate 325 (65 FE) MG tablet Take 1 tablet (325 mg total) by mouth daily with  breakfast. 30 tablet 0   folic acid (FOLVITE) 1 MG tablet Take 1 tablet (1 mg total) by mouth daily. 30 tablet 0   furosemide (LASIX) 80 MG tablet Take 1 tablet (80 mg total) by mouth daily. 30 tablet 0   hydrALAZINE (APRESOLINE) 50 MG tablet Take 1 tablet (50 mg total) by mouth 3 (three) times daily. 90 tablet 0   insulin glargine (LANTUS SOLOSTAR) 100 UNIT/ML Solostar Pen Inject 30 Units into the skin daily. 9 mL 0   Insulin Pen Needle 32G X 4 MM MISC For 4 times a day insulin Subcutaneously. 100 each 0   isosorbide mononitrate (IMDUR) 30 MG 24 hr tablet Take 1 tablet (30 mg total) by mouth daily. 30 tablet 0   mometasone-formoterol (DULERA) 200-5 MCG/ACT AERO Inhale 2 puffs into the lungs 2 (two) times daily. 13 g 0   spironolactone (ALDACTONE) 25 MG tablet Take 0.5 tablets (12.5 mg total) by mouth daily. 45 tablet 3   No current facility-administered medications for this visit.    There were no vitals filed for this visit.  PHYSICAL EXAM: General:  *** appearing.  No respiratory difficulty HEENT: normal Neck: supple. JVD *** cm.  Cor: PMI nondisplaced. Regular rate & rhythm. No rubs, gallops or murmurs. Lungs: clear Abdomen: soft, nontender, nondistended. Good bowel sounds. Extremities: no cyanosis, clubbing, rash, edema  Neuro: alert & oriented x 3. Moves all 4 extremities w/o difficulty. Affect pleasant.   ECG: ST 103 bpm (Personally reviewed from 08/06/23)     ASSESSMENT & PLAN: Chronic diastolic heart failure - Echo 1/61: EF 55-60%, LV with no RWMA, RV normal, trivial MR.  NYHA IIIa, limited by deconditioning 2/2 recent intubation periods GDMT  Diuretic- Continue lasix 80 mg daily BB- Continue coreg 25 mg BID  Ace/ARB/ARNI- Continue hydral/Imdur. Plan for eventual Entresto MRA - Start spiro 12.5 mg daily. BMET/BNP today. Repeat BMET 1 week.  SGLT2i- will hold off on SGLT2i at this time with history of DKA - With history of CHF in family may benefit from [facility] down the  road.  - Discussed cutting back on canned soup and fluid intake.    Chronic hypoxic respiratory failure - Patient with hx of ARDS 10/24 and 1/25 requiring prolonged intubation - Later presented again with Influenza - Also with hx of DAH during last bronch - Follows with pulmonology OP   HTN - BP stable today - Continue hydral/Imdur/ Coreg - Plan to stop hydral/imdur and switch to Entresto at next visit if renal function is stable ***   CKD - SCr 3/25 last 1.48 - BMET today   DM II - Hgb A1c 6.8 3/25   Referred to HFSW (PCP, Medications, Transportation, ETOH Abuse, Drug  Abuse, Insurance, Surveyor, quantity ):  No Refer to Pharmacy:  No Refer to Home Health:  No Refer to Advanced Heart Failure Clinic: Yes or no  Refer to General Cardiology: Yes or No  Follow up ***

## 2023-08-16 ENCOUNTER — Ambulatory Visit (HOSPITAL_COMMUNITY)
Admission: RE | Admit: 2023-08-16 | Discharge: 2023-08-16 | Disposition: A | Discharge status: Home / Self Care | Payer: MEDICAID | Source: Ambulatory Visit | Attending: Cardiology | Admitting: Cardiology

## 2023-08-16 DIAGNOSIS — I5032 Chronic diastolic (congestive) heart failure: Secondary | ICD-10-CM | POA: Insufficient documentation

## 2023-08-16 LAB — BASIC METABOLIC PANEL
Anion gap: 7 (ref 5–15)
BUN: 14 mg/dL (ref 6–20)
CO2: 28 mmol/L (ref 22–32)
Calcium: 9.6 mg/dL (ref 8.9–10.3)
Chloride: 103 mmol/L (ref 98–111)
Creatinine, Ser: 1.18 mg/dL (ref 0.61–1.24)
GFR, Estimated: >60 mL/min (ref >60)
Glucose, Bld: 226 mg/dL — ABNORMAL HIGH (ref 70–99)
Potassium: 3.7 mmol/L (ref 3.5–5.1)
Sodium: 138 mmol/L (ref 135–145)

## 2023-08-21 ENCOUNTER — Inpatient Hospital Stay (HOSPITAL_BASED_OUTPATIENT_CLINIC_OR_DEPARTMENT_OTHER): Payer: Self-pay | Admitting: Primary Care

## 2023-08-26 ENCOUNTER — Encounter (HOSPITAL_BASED_OUTPATIENT_CLINIC_OR_DEPARTMENT_OTHER): Payer: Self-pay

## 2023-08-26 ENCOUNTER — Telehealth (HOSPITAL_COMMUNITY): Payer: Self-pay

## 2023-08-26 NOTE — Telephone Encounter (Signed)
 Called to confirm/remind patient of their appointment at the Advanced Heart Failure Clinic on 08/27/2023 1:30.   Appointment:   [] Confirmed  [x] Left mess   [] No answer/No voice mail  [] Phone not in service  Patient reminded to bring all medications and/or complete list.  Confirmed patient has transportation. Gave directions, instructed to utilize valet parking.

## 2023-08-27 ENCOUNTER — Encounter (HOSPITAL_COMMUNITY): Payer: Self-pay

## 2023-08-27 ENCOUNTER — Ambulatory Visit (HOSPITAL_COMMUNITY)
Admission: RE | Admit: 2023-08-27 | Discharge: 2023-08-27 | Disposition: A | Discharge status: Home / Self Care | Payer: MEDICAID | Source: Ambulatory Visit | Attending: Internal Medicine | Admitting: Internal Medicine

## 2023-08-27 ENCOUNTER — Other Ambulatory Visit (HOSPITAL_COMMUNITY): Payer: Self-pay

## 2023-08-27 VITALS — BP 160/110 | HR 107 | Ht 68.0 in | Wt 196.4 lb

## 2023-08-27 DIAGNOSIS — I130 Hypertensive heart and chronic kidney disease with heart failure and stage 1 through stage 4 chronic kidney disease, or unspecified chronic kidney disease: Secondary | ICD-10-CM | POA: Insufficient documentation

## 2023-08-27 DIAGNOSIS — Z794 Long term (current) use of insulin: Secondary | ICD-10-CM | POA: Insufficient documentation

## 2023-08-27 DIAGNOSIS — Z5986 Financial insecurity: Secondary | ICD-10-CM | POA: Insufficient documentation

## 2023-08-27 DIAGNOSIS — E1122 Type 2 diabetes mellitus with diabetic chronic kidney disease: Secondary | ICD-10-CM | POA: Insufficient documentation

## 2023-08-27 DIAGNOSIS — I10 Essential (primary) hypertension: Secondary | ICD-10-CM

## 2023-08-27 DIAGNOSIS — Z8249 Family history of ischemic heart disease and other diseases of the circulatory system: Secondary | ICD-10-CM | POA: Insufficient documentation

## 2023-08-27 DIAGNOSIS — E1165 Type 2 diabetes mellitus with hyperglycemia: Secondary | ICD-10-CM

## 2023-08-27 DIAGNOSIS — J9611 Chronic respiratory failure with hypoxia: Secondary | ICD-10-CM | POA: Insufficient documentation

## 2023-08-27 DIAGNOSIS — Z79899 Other long term (current) drug therapy: Secondary | ICD-10-CM | POA: Insufficient documentation

## 2023-08-27 DIAGNOSIS — I5032 Chronic diastolic (congestive) heart failure: Secondary | ICD-10-CM | POA: Insufficient documentation

## 2023-08-27 DIAGNOSIS — N182 Chronic kidney disease, stage 2 (mild): Secondary | ICD-10-CM

## 2023-08-27 DIAGNOSIS — N189 Chronic kidney disease, unspecified: Secondary | ICD-10-CM | POA: Insufficient documentation

## 2023-08-27 LAB — BRAIN NATRIURETIC PEPTIDE: B Natriuretic Peptide: 196.5 pg/mL — ABNORMAL HIGH (ref 0.0–100.0)

## 2023-08-27 LAB — BASIC METABOLIC PANEL WITH GFR
Anion gap: 10 (ref 5–15)
BUN: 11 mg/dL (ref 6–20)
CO2: 21 mmol/L — ABNORMAL LOW (ref 22–32)
Calcium: 8.7 mg/dL — ABNORMAL LOW (ref 8.9–10.3)
Chloride: 109 mmol/L (ref 98–111)
Creatinine, Ser: 1.12 mg/dL (ref 0.61–1.24)
GFR, Estimated: >60 mL/min (ref >60)
Glucose, Bld: 128 mg/dL — ABNORMAL HIGH (ref 70–99)
Potassium: 3.8 mmol/L (ref 3.5–5.1)
Sodium: 140 mmol/L (ref 135–145)

## 2023-08-27 MED ORDER — ENTRESTO 49-51 MG PO TABS: 1.0000 | ORAL_TABLET | Freq: Two times a day (BID) | ORAL | 6 refills | Status: DC

## 2023-08-27 NOTE — Patient Instructions (Signed)
 Stop hydralazine. Stop Imdur. Start Entresto 49/51 mg twice daily. Free 30 day coupon provided. Referral sent to General Cardiology - you may call them at number below to schedule your first appointment. Labs today - will call you if abnormal. Please call us at [phone] if any questions or concerns prior to your next visit.

## 2023-08-27 NOTE — Progress Notes (Signed)
 ReDS Vest / Clip - 08/27/23 1327       ReDS Vest / Clip   Station Marker D    Ruler Value 34    ReDS Value Range Low volume    ReDS Actual Value 34

## 2023-08-29 ENCOUNTER — Ambulatory Visit: Payer: Self-pay | Admitting: Internal Medicine

## 2023-08-29 ENCOUNTER — Ambulatory Visit: Payer: Self-pay | Admitting: Family

## 2023-08-29 ENCOUNTER — Encounter: Payer: Self-pay | Admitting: Internal Medicine

## 2023-08-29 VITALS — BP 160/122 | HR 102 | Ht 68.0 in | Wt 194.2 lb

## 2023-08-29 DIAGNOSIS — R0609 Other forms of dyspnea: Secondary | ICD-10-CM

## 2023-08-29 DIAGNOSIS — J4540 Moderate persistent asthma, uncomplicated: Secondary | ICD-10-CM

## 2023-08-29 DIAGNOSIS — Z8709 Personal history of other diseases of the respiratory system: Secondary | ICD-10-CM

## 2023-08-29 NOTE — Patient Instructions (Addendum)
 It was a pleasure to see you today!  Please schedule follow up with myself in 6 months.  If my schedule is not open yet, we will contact you with a reminder closer to that time. Please call [phone] if you haven't heard from [place] a month before, and always call us sooner if issues or concerns arise. You can also send [place] a message through MyChart, but but aware that this is not to be used for urgent issues and it may take up to 5-7 days to receive a reply. Please be aware that you will likely be able to view your results before I have a chance to respond to them. Please give [place] 5 business days to respond to any non-urgent results.    Before your next visit I would like you to have: Full set of PFTs - 1 hour  VISIT SUMMARY:  Today, we discussed your ongoing breathing difficulties, history of recurrent pneumonia, heart failure, and diabetes management. We reviewed your current medications and made plans for further testing and vaccinations to help manage your conditions.  YOUR PLAN:  -ASTHMA: Asthma is a condition where your airways narrow and swell, making it difficult to breathe. Although you don't have a history of asthma, your symptoms may be residual effects from past respiratory infections. You will continue using your Dulera inhaler twice daily and your albuterol inhaler as needed, up to four times a day. We discussed the importance of infection prevention, including getting vaccinated, avoiding sick contacts, using a mask, and practicing good hand hygiene. You will receive the pneumonia vaccine in September when you turn 48. We will plan for baseline pulmonary function testing in six months to better understand your lung function.  -HEART FAILURE WITH PRESERVED EJECTION FRACTION (HFPEF): Heart failure with preserved ejection fraction means your heart has difficulty relaxing and filling with blood properly, often due to high blood pressure. You will continue taking Lasix as prescribed to  manage fluid buildup. It's important to monitor your blood pressure and ensure you take your blood pressure medications as directed. We will recheck your blood pressure before you leave the office today.   INSTRUCTIONS:  Please schedule a follow-up appointment in six months. If you experience worsening breathing or have any concerns, contact us immediately.

## 2023-08-29 NOTE — Progress Notes (Signed)
 Gary Conway    [id number]    Feb 13, 1976  Primary Care Physician:Hudnell, Judeth Cornfield, NP Date of Appointment: 08/29/2023 Established Patient Visit  Chief complaint:   Chief Complaint  Patient presents with   Follow-up    Breathing issue, started last night. Getting better     HPI Gary Conway is a 48 y.o. man, never smoker who had prolonged hospitalization twice both in 2024 and in January 2025 for respiratory failure influenza pneumonia severe ARDS and sepsis with shock.  He required intubation, treatment for heart failure exacerbation, also treatment for DKA.  He saw Blase Mess in follow-up and was doing well and was weaned off his oxygen.  He was post to have PFTs but those are not yet ordered.  Interval Updates:  Discussed the use of AI scribe software for clinical note transcription with the patient, who gave verbal consent to proceed.  History of Present Illness     I have reviewed the patient's family social and past medical history and updated as appropriate.   Past Medical History:  Diagnosis Date   Diabetes mellitus, type II (HCC) 2015   Diagnosed during 2015 admission to [facility]   Hemangioma of spine 2015   S/P excision at [facility] with pathology favoring benign Hemangioma    Past Surgical History:  Procedure Laterality Date   LAMINECTOMY N/A 03/22/2014   Procedure: THORACIC two - four  LAMINECTOMY FOR TUMOR;  Surgeon: Temple Pacini, MD;  Location: [facility];  Service: Neurosurgery;  Laterality: N/A;  Throacic two  - four laminectomy for tumor   LEG SURGERY      Family History  Problem Relation Age of Onset   Breast cancer Mother     Social History   Occupational History   Not on file  Tobacco Use   Smoking status: Never   Smokeless tobacco: Never  Vaping Use   Vaping status: Never Used  Substance and Sexual Activity   Alcohol use: Yes    Comment: social   Drug use: No   Sexual activity: Not Currently     Physical  Exam: Blood pressure (!) 160/122, pulse (!) 102, height 5\' 8"  (1.727 m), weight 194 lb 3.2 oz (88.1 kg), SpO2 98%.  Gen:      No acute distress, fatigued ENT:  no nasal polyps, mucus membranes moist Lungs:    No increased respiratory effort, symmetric chest wall excursion, clear to auscultation bilaterally, no wheezes or crackles CV:         Regular rate and rhythm; no murmurs, rubs, or gallops.  No pedal edema   Data Reviewed: Imaging: I have personally reviewed the chest xrays July 30 2023 - bilateral patchy airspace disease with small bilateral pleural effusions, improved from previous.   PFTs:      No data to display           Labs: Lab Results  Component Value Date   NA 140 08/27/2023   K 3.8 08/27/2023   CO2 21 (L) 08/27/2023   GLUCOSE 128 (H) 08/27/2023   BUN 11 08/27/2023   CREATININE 1.12 08/27/2023   CALCIUM 8.7 (L) 08/27/2023   GFRNONAA >60 08/27/2023   Lab Results  Component Value Date   WBC 6.1 08/02/2023   HGB 9.1 (L) 08/02/2023   HCT 28.7 (L) 08/02/2023   MCV 71.2 (L) 08/02/2023   PLT 193 08/02/2023  Absolute eosinophil count 300 in March 2025 while hospitalized on steroids  Immunization  status:  There is no immunization history on file for this patient.  External Records Personally Reviewed: pulmonary, hospital stay  Assessment:  Moderate persistent asthma, improved control, following recent: ARDS secondary to influenza Legionella pneumonia Chronic HFPEF  Moderate persistent asthma, improved control. Dyspnea during exertion, possibly residual asthma post-respiratory infections. No childhood or family history. Baseline pulmonary function testing planned. - Continue Dulera inhaler, two puffs morning and night. - Continue albuterol inhaler as needed, up to four times daily. - Emphasized infection prevention: vaccination, avoiding sick contacts, mask use, hand hygiene. - Administer pneumonia vaccine in September at age 10. - Plan baseline  pulmonary function testing in six months.  Heart failure with preserved ejection fraction (HFpEF) Diastolic dysfunction likely due to long-standing hypertension. Blood pressure elevated, possibly white coat syndrome. - Continue Lasix as prescribed. - Monitor blood pressure, ensure adherence to antihypertensives. - Recheck blood pressure before leaving office.   I spent 30 minutes on 08/29/2023 in care of this patient including face to face time and non-face to face time spent charting, review of outside records, and coordination of care.   Return to Care: Return in about 6 months (around 02/28/2024) for follow up with PFT and same day appointment.   Durel Salts, MD Pulmonary and Critical Care Medicine [facility] Office:[phone]

## 2023-08-31 ENCOUNTER — Other Ambulatory Visit: Payer: Self-pay

## 2023-09-02 ENCOUNTER — Other Ambulatory Visit: Payer: Self-pay

## 2023-09-03 ENCOUNTER — Other Ambulatory Visit: Payer: Self-pay

## 2023-09-03 ENCOUNTER — Other Ambulatory Visit (HOSPITAL_BASED_OUTPATIENT_CLINIC_OR_DEPARTMENT_OTHER): Payer: Self-pay

## 2023-09-05 ENCOUNTER — Other Ambulatory Visit (HOSPITAL_BASED_OUTPATIENT_CLINIC_OR_DEPARTMENT_OTHER): Payer: Self-pay

## 2023-09-13 ENCOUNTER — Telehealth: Payer: Self-pay | Admitting: Internal Medicine

## 2023-09-13 NOTE — Telephone Encounter (Signed)
 Copied from CRM [id number]. Topic: Clinical - Medication Refill >> Sep 13, 2023  1:21 PM Hilton Lucky wrote: Medication: Patient states needs them all refilled, could not provide specific names.   Has the patient contacted their pharmacy? No (Agent: If no, request that the patient contact the pharmacy for the refill. If patient does not wish to contact the pharmacy document the reason why and proceed with request.) (Agent: If yes, when and what did the pharmacy advise?)  Is this the correct pharmacy for this prescription? Yes If no, delete pharmacy and type the correct one.  This is the patient's preferred pharmacy:  [id number] - [place] ([place]), [place] - [address] [place] ([place]) [place] [address] Phone: [phone] Fax: [phone]   Has the prescription been filled recently? No  Is the patient out of the medication? Yes  Has the patient been seen for an appointment in the last year OR does the patient have an upcoming appointment? Yes  Can we respond through MyChart? Yes  Agent: Please be advised that Rx refills may take up to 3 business days. We ask that you follow-up with your pharmacy.

## 2023-09-13 NOTE — Telephone Encounter (Signed)
 Pt has NP establish appt scheduled for 05/22, requesting refills on all medications. This RN LVM advising pt he would need to contact previous provider for 30 day script until NP appt.

## 2023-10-04 ENCOUNTER — Encounter (HOSPITAL_COMMUNITY): Payer: Self-pay

## 2023-10-11 ENCOUNTER — Other Ambulatory Visit (HOSPITAL_COMMUNITY): Payer: Self-pay

## 2023-10-11 ENCOUNTER — Ambulatory Visit (INDEPENDENT_AMBULATORY_CARE_PROVIDER_SITE_OTHER): Payer: Self-pay | Admitting: Family

## 2023-10-11 VITALS — BP 158/100 | HR 94 | Temp 98.2°F | Ht 66.5 in | Wt 196.4 lb

## 2023-10-11 DIAGNOSIS — Z8701 Personal history of pneumonia (recurrent): Secondary | ICD-10-CM

## 2023-10-11 DIAGNOSIS — I152 Hypertension secondary to endocrine disorders: Secondary | ICD-10-CM

## 2023-10-11 DIAGNOSIS — I5032 Chronic diastolic (congestive) heart failure: Secondary | ICD-10-CM

## 2023-10-11 DIAGNOSIS — E1169 Type 2 diabetes mellitus with other specified complication: Secondary | ICD-10-CM

## 2023-10-11 DIAGNOSIS — J4541 Moderate persistent asthma with (acute) exacerbation: Secondary | ICD-10-CM

## 2023-10-11 DIAGNOSIS — D508 Other iron deficiency anemias: Secondary | ICD-10-CM

## 2023-10-11 DIAGNOSIS — E669 Obesity, unspecified: Secondary | ICD-10-CM

## 2023-10-11 DIAGNOSIS — D649 Anemia, unspecified: Secondary | ICD-10-CM | POA: Insufficient documentation

## 2023-10-11 DIAGNOSIS — Z794 Long term (current) use of insulin: Secondary | ICD-10-CM

## 2023-10-11 DIAGNOSIS — R29898 Other symptoms and signs involving the musculoskeletal system: Secondary | ICD-10-CM

## 2023-10-11 DIAGNOSIS — E1159 Type 2 diabetes mellitus with other circulatory complications: Secondary | ICD-10-CM

## 2023-10-11 DIAGNOSIS — E1122 Type 2 diabetes mellitus with diabetic chronic kidney disease: Secondary | ICD-10-CM

## 2023-10-11 DIAGNOSIS — J45909 Unspecified asthma, uncomplicated: Secondary | ICD-10-CM | POA: Insufficient documentation

## 2023-10-11 MED ORDER — CARVEDILOL 25 MG PO TABS
25.0000 mg | ORAL_TABLET | Freq: Two times a day (BID) | ORAL | 1 refills | Status: DC
  Filled 2023-10-11 (×2): qty 60, 30d supply, fill #0
  Filled 2023-11-05: qty 60, 30d supply, fill #1

## 2023-10-11 MED ORDER — FUROSEMIDE 40 MG PO TABS
40.0000 mg | ORAL_TABLET | Freq: Every morning | ORAL | 5 refills | Status: AC
  Filled 2023-10-11 (×2): qty 30, 30d supply, fill #0
  Filled 2023-11-05: qty 30, 30d supply, fill #1
  Filled 2023-12-25 – 2024-01-09 (×2): qty 30, 30d supply, fill #2
  Filled 2024-04-13: qty 30, 30d supply, fill #3

## 2023-10-11 MED ORDER — SPIRONOLACTONE 25 MG PO TABS
12.5000 mg | ORAL_TABLET | Freq: Every day | ORAL | 3 refills | Status: DC
  Filled 2023-10-11 (×2): qty 45, 90d supply, fill #0
  Filled 2023-11-05: qty 45, 90d supply, fill #1
  Filled 2023-12-25 – 2024-01-09 (×2): qty 45, 90d supply, fill #2

## 2023-10-11 MED ORDER — VITAMIN B-12 1000 MCG PO TABS
1000.0000 ug | ORAL_TABLET | Freq: Every day | ORAL | 5 refills | Status: AC
  Filled 2023-10-11 (×2): qty 30, 30d supply, fill #0
  Filled 2023-11-05: qty 30, 30d supply, fill #1
  Filled 2023-12-25 – 2024-01-09 (×2): qty 30, 30d supply, fill #2
  Filled 2024-04-13: qty 30, 30d supply, fill #3

## 2023-10-11 MED ORDER — ENTRESTO 49-51 MG PO TABS
1.0000 | ORAL_TABLET | Freq: Two times a day (BID) | ORAL | 6 refills | Status: DC
  Filled 2023-10-11 (×2): qty 60, 30d supply, fill #0
  Filled 2023-11-05 – 2023-12-25 (×3): qty 60, 30d supply, fill #1

## 2023-10-11 MED ORDER — FOLIC ACID 1 MG PO TABS
1.0000 mg | ORAL_TABLET | Freq: Every day | ORAL | 11 refills | Status: AC
  Filled 2023-10-11 (×2): qty 30, 30d supply, fill #0
  Filled 2023-11-05: qty 30, 30d supply, fill #1
  Filled 2023-12-25 – 2024-01-09 (×2): qty 30, 30d supply, fill #2
  Filled 2024-04-13: qty 30, 30d supply, fill #3

## 2023-10-11 MED ORDER — FERROUS SULFATE 325 (65 FE) MG PO TBEC
325.0000 mg | DELAYED_RELEASE_TABLET | Freq: Every day | ORAL | 5 refills | Status: DC
  Filled 2023-10-11 (×2): qty 30, 30d supply, fill #0
  Filled 2023-11-05: qty 30, 30d supply, fill #1
  Filled 2023-12-25 – 2024-01-09 (×2): qty 30, 30d supply, fill #2
  Filled 2024-04-13: qty 30, 30d supply, fill #3

## 2023-10-11 MED ORDER — ALBUTEROL SULFATE HFA 108 (90 BASE) MCG/ACT IN AERS
2.0000 | INHALATION_SPRAY | Freq: Four times a day (QID) | RESPIRATORY_TRACT | 5 refills | Status: AC | PRN
  Filled 2023-10-11 (×2): qty 6.7, 25d supply, fill #0
  Filled 2023-11-05: qty 6.7, 25d supply, fill #1
  Filled 2023-12-25 – 2024-01-09 (×2): qty 6.7, 25d supply, fill #2

## 2023-10-11 NOTE — Progress Notes (Signed)
 New Patient Office Visit  Subjective:  Patient ID: Gary Conway, male    DOB: [birth year]  Age: 48 y.o. MRN: [id number]  CC:  Chief Complaint  Patient presents with   Establish Care    HPI Xaivier Conway presents for establishing care today.  Discussed the use of AI scribe software for clinical note transcription with the patient, who gave verbal consent to proceed.  History of Present Illness Gary Conway is a 48 year old male with diabetes and congestive heart failure who presents for establishment of care following recent hospitalizations for sepsis and respiratory failure.  He has had multiple hospitalizations since October for pneumonia, sepsis, and respiratory failure, with stays of 15 days, 10 days, and another episode later. He was recently diagnosed with congestive heart failure and is in contact with cardiology but has issues with refilling carvedilol  and Lasix . He uses a short-acting inhaler for dyspnea during long walks at work. He experiences foot swelling, attributed to prolonged standing. Diabetes is managed with Lantus  insulin , 20 to 30 units twice daily. Blood sugar levels range from 115 to 120 mg/dL. Metformin  was discontinued during his October hospitalization due to high blood sugar levels. He suspects rheumatoid arthritis due to maternal history and experiences hand tightness. He works full-time in a physically demanding job and is [employer] options. He is trying to obtain Medicaid for insurance coverage. He takes iron supplements for anemia and feels cold since his recent illness. No current ankle swelling. No smoking history.  Assessment & Plan Congestive Heart Failure - Newly diagnosed. Has had f/u w/Cards, but still not started meds d/t pharmacy problem. Emphasized medication adherence and fluid management. Discussed work adjustments and potential temporary leave. - Resend carvedilol  25mg  twice daily, Entresto  49-51mg  bid. - Resend Lasix ,  start with 80mg  in the morning x1w, ok to cut in half when back to work. - Write letter to be out of work for next week & light duty with breaks for up to 3 weeks. - Advise fluid intake up to two liters daily, avoid caffeine. - F/U with cardiology as planned  HTN -  Chronic w/T2DM, BP high today, pt was unable to get meds after hospital due to confusion with pharmacy. Resending meds today. - Resend Carvedilol  25mg  bid - Resend Lasix  80mg  qam, reducing to 40mg  qam when back to work. - Discussed low sodium diet, daily 2L water intake, exercise as able - F/U in 1 month  Type 2 Diabetes Mellitus - Managed with Lantus  insulin . Last A1C 6.8 07/2023. Hx of Metformin , no longer effective. Blood glucose generally well-controlled. Emphasized maintaining levels under 150 mg/dL postprandial. - Continue Lantus  insulin , 20-30 units twice daily, no refill needed today. - Monitor blood glucose regularly, fasting goal <120, postprandial <150. - Adjust Lantus  if levels exceed 150 mg/dL, increasing insulin  by 2u qd  - F/U in 1 month  Anemia - Mild anemia contributing to fatigue. Discussed iron supplementation and side effects. - Refill Ferrous sulfate , change to ER, advised on possible SE. - Refill Folate, B-12 supplements. - Advise high fiber diet and adequate hydration. - Recheck labs in 3 mos  Reactive airway - Recent PNA x2 episodes/Resp failure/Sepsis requiring hospitalization x2, most recent d/c 08/02/2023, no current symptoms other than SOB, walks several miles to work daily. Discussed vaccination importance. Will hold off on Dulera  for now, doing ok, lungs clear on exam. - Administer pneumonia vaccine when appropriate. - Refill Albuterol  inhaler, continue prn use. - F/U in 1 month  Subjective:  Outpatient Medications Prior to Visit  Medication Sig Dispense Refill   blood glucose meter kit and supplies KIT Dispense based on patient and insurance preference. Use up to four times daily as  directed. (FOR ICD-9 250.00, 250.01). For QAC - HS accuchecks. 1 each 1   Blood Glucose Monitoring Suppl (BLOOD GLUCOSE MONITOR SYSTEM) w/Device KIT Use to test blood sugar in the morning, at noon, and at bedtime. 1 kit 0   insulin  glargine (LANTUS  SOLOSTAR) 100 UNIT/ML Solostar Pen Inject 30 Units into the skin daily. 9 mL 0   Insulin  Pen Needle 32G X 4 MM MISC For 4 times a day insulin  Subcutaneously. 100 each 0   mometasone -formoterol  (DULERA ) 200-5 MCG/ACT AERO Inhale 2 puffs into the lungs 2 (two) times daily. 13 g 0   albuterol  (VENTOLIN  HFA) 108 (90 Base) MCG/ACT inhaler Inhale 2 puffs into the lungs every 6 (six) hours as needed for wheezing or shortness of breath. 6.7 g 0   furosemide  (LASIX ) 80 MG tablet Take 1 tablet (80 mg total) by mouth daily. 30 tablet 0   spironolactone  (ALDACTONE ) 25 MG tablet Take 0.5 tablets (12.5 mg total) by mouth daily. 45 tablet 3   azithromycin  (ZITHROMAX ) 500 MG tablet Take 1 tablet (500 mg total) by mouth daily. 3 tablet 0   carvedilol  (COREG ) 25 MG tablet Take 1 tablet (25 mg total) by mouth 2 (two) times daily with a meal. (Patient not taking: Reported on 10/11/2023) 30 tablet 0   cyanocobalamin  (VITAMIN B12) 1000 MCG tablet Take 1 tablet (1,000 mcg total) by mouth daily. (Patient not taking: Reported on 10/11/2023) 30 tablet 0   ferrous sulfate  325 (65 FE) MG tablet Take 1 tablet (325 mg total) by mouth daily with breakfast. (Patient not taking: Reported on 10/11/2023) 30 tablet 0   folic acid  (FOLVITE ) 1 MG tablet Take 1 tablet (1 mg total) by mouth daily. (Patient not taking: Reported on 10/11/2023) 30 tablet 0   sacubitril -valsartan  (ENTRESTO ) 49-51 MG Take 1 tablet by mouth 2 (two) times daily. (Patient not taking: Reported on 10/11/2023) 60 tablet 6   No facility-administered medications prior to visit.   Past Medical History:  Diagnosis Date   Acute hypoxemic respiratory failure (HCC) 03/25/2023   Acute respiratory failure (HCC) 08/02/2023   Acute  respiratory failure with hypoxemia (HCC) 06/17/2023   Acute respiratory failure with hypoxia (HCC) 04/02/2023   ARDS (adult respiratory distress syndrome) (HCC) 06/17/2023   CAP (community acquired pneumonia) 03/22/2023   Diabetes mellitus, type II (HCC) 2015   Diagnosed during 2015 admission to [facility]   Diabetic ketoacidosis without coma associated with type 2 diabetes mellitus (HCC) 09/13/2019   Hemangioma of spine 2015   S/P excision at [facility] with pathology favoring benign Hemangioma   Influenza A 06/17/2023   Numbness and tingling of both legs 03/22/2014   Paraplegia (HCC) 03/22/2014   Severe sepsis (HCC) 03/22/2023   Past Surgical History:  Procedure Laterality Date   LAMINECTOMY N/A 03/22/2014   Procedure: THORACIC two - four  LAMINECTOMY FOR TUMOR;  Surgeon: Baruch Bosch, MD;  Location: [facility];  Service: Neurosurgery;  Laterality: N/A;  Throacic two  - four laminectomy for tumor   LEG SURGERY      Objective:   Today's Vitals: BP (!) 158/100 (BP Location: Left Arm, Patient Position: Sitting, Cuff Size: Large)   Pulse 94   Temp 98.2 F (36.8 C) (Temporal)   Ht 5' 6.5" (1.689 m)   Wt 196 lb  6.1 oz (89.1 kg)   SpO2 96%   BMI 31.22 kg/m   Physical Exam Vitals and nursing note reviewed.  Constitutional:      General: He is not in acute distress.    Appearance: Normal appearance.  HENT:     Head: Normocephalic.  Cardiovascular:     Rate and Rhythm: Normal rate and regular rhythm.  Pulmonary:     Effort: Pulmonary effort is normal.     Breath sounds: Normal breath sounds.  Musculoskeletal:        General: Normal range of motion.     Cervical back: Normal range of motion.  Skin:    General: Skin is warm and dry.  Neurological:     Mental Status: He is alert and oriented to person, place, and time.  Psychiatric:        Mood and Affect: Mood normal.     Meds ordered this encounter  Medications   spironolactone  (ALDACTONE ) 25 MG tablet    Sig: Take 0.5  tablets (12.5 mg total) by mouth daily.    Dispense:  45 tablet    Refill:  3    Supervising Provider:   ANDY, CAMILLE L [2031]   sacubitril -valsartan  (ENTRESTO ) 49-51 MG    Sig: Take 1 tablet by mouth 2 (two) times daily.    Dispense:  60 tablet    Refill:  6    Supervising Provider:   ANDY, CAMILLE L [2031]   furosemide  (LASIX ) 40 MG tablet    Sig: Take 1 tablet (40 mg total) by mouth every morning.    Dispense:  30 tablet    Refill:  5    Supervising Provider:   ANDY, CAMILLE L [2031]   albuterol  (VENTOLIN  HFA) 108 (90 Base) MCG/ACT inhaler    Sig: Inhale 2 puffs into the lungs every 6 (six) hours as needed for wheezing or shortness of breath.    Dispense:  6.7 g    Refill:  5    Supervising Provider:   ANDY, CAMILLE L [2031]   carvedilol  (COREG ) 25 MG tablet    Sig: Take 1 tablet (25 mg total) by mouth 2 (two) times daily with a meal.    Dispense:  60 tablet    Refill:  1    Supervising Provider:   ANDY, CAMILLE L [2031]   cyanocobalamin  (VITAMIN B12) 1000 MCG tablet    Sig: Take 1 tablet (1,000 mcg total) by mouth daily.    Dispense:  30 tablet    Refill:  5    Supervising Provider:   ANDY, CAMILLE L [2031]   folic acid  (FOLVITE ) 1 MG tablet    Sig: Take 1 tablet (1 mg total) by mouth daily.    Dispense:  30 tablet    Refill:  11    Supervising Provider:   ANDY, CAMILLE L [2031]   ferrous sulfate  325 (65 FE) MG EC tablet    Sig: Take 1 tablet (325 mg total) by mouth daily with breakfast.    Dispense:  30 tablet    Refill:  5    Supervising Provider:   ANDY, CAMILLE L [2031]   *With further workup and treatment planned, risk to pt's morbidity is high with possible prolonged functional impairment and poor health outcomes.    Versa Gore, NP

## 2023-10-11 NOTE — Patient Instructions (Addendum)
 Welcome to [facility], It was a pleasure meeting you today!    As discussed, I have sent your refills to your pharmacy.  Let me know when you have the FMLA paperwork for me to complete. Be sure to keep your Pulmonary and Cardiology follow up appointments in the future.  Please schedule a 1 month follow up visit today.    PLEASE NOTE: If you had any LAB tests please let us  know if you have not heard back within a few days. You may see your results on MyChart before we have a chance to review them but we will give you a call once they are reviewed by us . If we ordered any REFERRALS today, please let us  know if you have not heard from their office within the next week.  Let us  know through MyChart if you are needing REFILLS, or have your pharmacy send us  the request. You can also use MyChart to communicate with me or any office staff.

## 2023-10-12 ENCOUNTER — Other Ambulatory Visit (HOSPITAL_BASED_OUTPATIENT_CLINIC_OR_DEPARTMENT_OTHER): Payer: Self-pay

## 2023-10-12 ENCOUNTER — Encounter: Payer: Self-pay | Admitting: Family

## 2023-10-12 DIAGNOSIS — E1159 Type 2 diabetes mellitus with other circulatory complications: Secondary | ICD-10-CM | POA: Insufficient documentation

## 2023-10-12 DIAGNOSIS — Z86018 Personal history of other benign neoplasm: Secondary | ICD-10-CM | POA: Insufficient documentation

## 2023-10-12 DIAGNOSIS — Z8701 Personal history of pneumonia (recurrent): Secondary | ICD-10-CM | POA: Insufficient documentation

## 2023-10-12 NOTE — Assessment & Plan Note (Signed)
 Chronic w/T2DM, BP high today, pt was unable to get meds after hospital due to confusion with pharmacy. Resending meds today. - Resend Carvedilol  25mg  bid - Resend Lasix  80mg  qam, reducing to 40mg  qam when back to work. - Discussed low sodium diet, daily 2L water intake, exercise as able - F/U in 1 month

## 2023-10-12 NOTE — Assessment & Plan Note (Signed)
 Newly diagnosed. Has had f/u w/Cards, but still not started meds d/t pharmacy problem. Emphasized medication adherence and fluid management. Discussed work adjustments and potential temporary leave. - Resend carvedilol  25mg  twice daily, Entresto  49-51mg  bid. - Resend Lasix , start with 80mg  in the morning x1w, ok to cut in half when back to work. - Write letter to be out of work for next week & light duty with breaks for up to 3 weeks. - Advise fluid intake up to two liters daily, avoid caffeine. - F/U with cardiology as planned

## 2023-10-12 NOTE — Assessment & Plan Note (Signed)
 Managed with Lantus  insulin . Last A1C 6.8 07/2023. Hx of Metformin , no longer effective. Blood glucose generally well-controlled. Emphasized maintaining levels under 150 mg/dL postprandial. - Continue Lantus  insulin , 20-30 units twice daily, no refill needed today. - Monitor blood glucose regularly, fasting goal <120, postprandial <150. - Adjust Lantus  if levels exceed 150 mg/dL, increasing insulin  by 2u qd  - F/U in 1 month

## 2023-10-12 NOTE — Assessment & Plan Note (Signed)
 Mild anemia contributing to fatigue. Discussed iron supplementation and side effects. - Refill Ferrous sulfate , change to ER, advised on possible SE. - Refill Folate, B-12 supplements. - Advise high fiber diet and adequate hydration. - Recheck labs in 3 mos

## 2023-10-12 NOTE — Assessment & Plan Note (Signed)
 Recurrent pneumonia x 2 episodes requiring hospitalization, most recent d/c 08/02/2023, no current symptoms other than SOB, walks several miles to work daily. Discussed vaccination importance. Will hold off on Dulera  for now, doing ok. - Administer pneumonia vaccine when appropriate. - Refill Albuterol  inhaler, continue prn use. - F/U in 1 month

## 2023-10-17 ENCOUNTER — Ambulatory Visit: Payer: Self-pay | Admitting: Family

## 2023-10-22 ENCOUNTER — Other Ambulatory Visit (HOSPITAL_BASED_OUTPATIENT_CLINIC_OR_DEPARTMENT_OTHER): Payer: Self-pay

## 2023-11-06 ENCOUNTER — Other Ambulatory Visit: Payer: Self-pay

## 2023-11-06 ENCOUNTER — Other Ambulatory Visit (HOSPITAL_COMMUNITY): Payer: Self-pay

## 2023-11-08 ENCOUNTER — Ambulatory Visit (INDEPENDENT_AMBULATORY_CARE_PROVIDER_SITE_OTHER): Payer: Self-pay | Admitting: Family

## 2023-11-08 ENCOUNTER — Other Ambulatory Visit (HOSPITAL_COMMUNITY): Payer: Self-pay

## 2023-11-08 ENCOUNTER — Encounter: Payer: Self-pay | Admitting: Family

## 2023-11-08 VITALS — BP 160/123 | HR 91 | Temp 98.2°F | Ht 66.5 in | Wt 198.0 lb

## 2023-11-08 DIAGNOSIS — I152 Hypertension secondary to endocrine disorders: Secondary | ICD-10-CM

## 2023-11-08 DIAGNOSIS — Z7984 Long term (current) use of oral hypoglycemic drugs: Secondary | ICD-10-CM

## 2023-11-08 DIAGNOSIS — E669 Obesity, unspecified: Secondary | ICD-10-CM

## 2023-11-08 DIAGNOSIS — E1159 Type 2 diabetes mellitus with other circulatory complications: Secondary | ICD-10-CM

## 2023-11-08 DIAGNOSIS — I5032 Chronic diastolic (congestive) heart failure: Secondary | ICD-10-CM

## 2023-11-08 DIAGNOSIS — E1169 Type 2 diabetes mellitus with other specified complication: Secondary | ICD-10-CM

## 2023-11-08 LAB — POCT GLYCOSYLATED HEMOGLOBIN (HGB A1C): Hemoglobin A1C: 8.9 % — AB (ref 4.0–5.6)

## 2023-11-08 MED ORDER — LANTUS SOLOSTAR 100 UNIT/ML ~~LOC~~ SOPN: 36.0000 [IU] | PEN_INJECTOR | Freq: Every day | SUBCUTANEOUS | Status: DC

## 2023-11-08 MED ORDER — METFORMIN HCL ER 500 MG PO TB24: 500.0000 mg | ORAL_TABLET | Freq: Two times a day (BID) | ORAL | 5 refills | Status: AC

## 2023-11-08 NOTE — Progress Notes (Signed)
 Patient ID: Gary Conway, male    DOB: 10-05-1975, 48 y.o.   MRN: [id number]  Chief Complaint  Patient presents with   Hypertension   Diabetes  Discussed the use of AI scribe software for clinical note transcription with the patient, who gave verbal consent to proceed.  History of Present Illness   Gary Conway is a 48 year old male with diabetes and hypertension who presents for follow-up of his blood sugar and blood pressure management.  His recent A1c level is 8.9, indicating poor glycemic control. He monitors his blood glucose at home, with fasting levels around 100 mg/dL and evening levels sometimes exceeding 200 mg/dL. He is on 30 units of Lantus  insulin  once daily. Metformin  was discontinued during a recent hospital stay.  He has hypertension and is taking Entresto  and carvedilol  twice daily. He did not take his blood pressure medication this morning. Home blood pressure monitoring shows a recent reading of 159/118 mmHg. He has a follow-up with cardiology scheduled for September or August. Spironolactone  was continued, and hydralazine  and Endor were stopped during a recent cardiology visit. He is also taking furosemide  and possibly Indore and hydralazine . He is trying to obtain Medicaid coverage to assist with medication costs.   Assessment and Plan    Type 2 Diabetes Mellitus A1c at 8.9% indicates poor glycemic control. Awaiting Medicaid approval to order SGLT-2 which will be beneficial for renal, cardiac, and glycemic management. Goal: A1c reduction to 6% and fasting glucose below 120 mg/dL, postprandial below 782. - Increase Lantus  insulin  to 36 units once daily. - Sending Metformin  500mg  ER bid. -  Continue to monitor blood glucose levels in the morning and before supper, and report abnormal readings, parameters provided. - Notify upon Medicaid approval for potential new medication. - F/U in 1 month  Hypertension Blood pressure at 159/118 mmHg indicates poor control.  Did not take am meds. Coordination with cardiology needed for potential amlodipine  restart or current Entresto  dose adjustment, Coreg  at max dose. - Adhere to current antihypertensive regimen: Entresto , carvedilol , and furosemide . - Record BP readings for next 4 days and call Tuesday with readings. - Consult cardiology for blood pressure management recommendations and potential medication adjustments. - F/U in 1 month  Heart Failure Under cardiology care. Coordination with cardiology needed for ongoing management and potential medication adjustments. - Continue current heart failure management regimen. - Coordinate with cardiology for ongoing management and potential medication adjustments.     Subjective:    Outpatient Medications Prior to Visit  Medication Sig Dispense Refill   albuterol  (VENTOLIN  HFA) 108 (90 Base) MCG/ACT inhaler Inhale 2 puffs into the lungs every 6 (six) hours as needed for wheezing or shortness of breath. 6.7 g 5   blood glucose meter kit and supplies KIT Dispense based on patient and insurance preference. Use up to four times daily as directed. (FOR ICD-9 250.00, 250.01). For QAC - HS accuchecks. 1 each 1   Blood Glucose Monitoring Suppl (BLOOD GLUCOSE MONITOR SYSTEM) w/Device KIT Use to test blood sugar in the morning, at noon, and at bedtime. 1 kit 0   carvedilol  (COREG ) 25 MG tablet Take 1 tablet (25 mg total) by mouth 2 (two) times daily with a meal. 60 tablet 1   cyanocobalamin  (VITAMIN B12) 1000 MCG tablet Take 1 tablet (1,000 mcg total) by mouth daily. 30 tablet 5   ferrous sulfate  325 (65 FE) MG EC tablet Take 1 tablet (325 mg total) by mouth daily with breakfast. 30 tablet 5  folic acid  (FOLVITE ) 1 MG tablet Take 1 tablet (1 mg total) by mouth daily. 30 tablet 11   furosemide  (LASIX ) 40 MG tablet Take 1 tablet (40 mg total) by mouth every morning. 30 tablet 5   insulin  glargine (LANTUS  SOLOSTAR) 100 UNIT/ML Solostar Pen Inject 30 Units into the skin daily. 9  mL 0   Insulin  Pen Needle 32G X 4 MM MISC For 4 times a day insulin  Subcutaneously. 100 each 0   mometasone -formoterol  (DULERA ) 200-5 MCG/ACT AERO Inhale 2 puffs into the lungs 2 (two) times daily. 13 g 0   sacubitril -valsartan  (ENTRESTO ) 49-51 MG Take 1 tablet by mouth 2 (two) times daily. 60 tablet 6   spironolactone  (ALDACTONE ) 25 MG tablet Take 0.5 tablets (12.5 mg total) by mouth daily. 45 tablet 3   No facility-administered medications prior to visit.   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   CAP (community acquired pneumonia) 03/22/2023   Diabetes mellitus, type II (HCC) 2015   Diagnosed during 2015 admission to [facility]   Diabetic ketoacidosis without coma associated with type 2 diabetes mellitus (HCC) 09/13/2019   Diffuse 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   Hemangioma of spine 2015   S/P excision at [facility] with pathology favoring benign Hemangioma   Influenza A 06/17/2023   Multifocal pneumonia 07/29/2023   Numbness and tingling of both legs 03/22/2014   Paraplegia (HCC) 03/22/2014   Severe sepsis (HCC) 03/22/2023   Spinal hemangioma 03/23/2014   Thoracic myelopathy 03/22/2014   Past Surgical History:  Procedure Laterality Date   LAMINECTOMY N/A 03/22/2014   Procedure: THORACIC two - four  LAMINECTOMY FOR TUMOR;  Surgeon: Baruch Bosch, MD;  Location: [facility] OR[facility];  Service: Neurosurgery;  Laterality: N/A;  Throacic two  - four laminectomy for tumor   LEG SURGERY     Allergies  Allergen Reactions   Food Hives, Itching and Rash    Mayonnaise - itching, rash, hives   Mustard Hives, Itching and  Rash      Objective:    Physical Exam Vitals and nursing note reviewed.  Constitutional:      General: He is not in acute distress.    Appearance: Normal appearance.  HENT:     Head: Normocephalic.   Cardiovascular:     Rate and Rhythm: Normal rate and regular rhythm.  Pulmonary:     Effort: Pulmonary effort is normal.     Breath sounds: Normal breath sounds.   Musculoskeletal:        General: Normal range of motion.     Cervical back: Normal range of motion.   Skin:    General: Skin is warm and dry.   Neurological:     Mental Status: He is alert and oriented to person, place, and time.   Psychiatric:        Mood and Affect: Mood normal.    BP (!) 159/118   Pulse 91   Temp 98.2 F (36.8 C)   Ht 5' 6.5 (1.689 m)   Wt 198 lb (89.8 kg)   SpO2 97%   BMI 31.48 kg/m  Wt Readings from Last 3 Encounters:  11/08/23 198 lb (  89.8 kg)  10/11/23 196 lb 6.1 oz (89.1 kg)  08/29/23 194 lb 3.2 oz (88.1 kg)       Versa Gore, NP

## 2023-11-08 NOTE — Patient Instructions (Addendum)
 It was very nice to see you today!   Increase your insulin  to 36 units daily. Keep checking a fasting blood sugar and before supper. Let me know if still running higher than 120 fasting or 150 at supper. Blood pressure is too high, BE SURE and take your medicine as soon as you get home.  Check your BP readings and let me know if running higher than 140/90 either number. Then I will let Cardiology know and ask for their help with your medicines.  Schedule a one month follow up visit.  PLEASE TAKE YOUR MEDICINE BEFORE YOU COME!   PLEASE NOTE:  If you had any lab tests please let us  know if you have not heard back within a few days. You may see your results on MyChart before we have a chance to review them but we will give you a call once they are reviewed by us . If we ordered any referrals today, please let us  know if you have not heard from their office within the next week.

## 2023-11-08 NOTE — Assessment & Plan Note (Signed)
 Blood pressure at 159/118 mmHg indicates poor control. Did not take am meds. Coordination with cardiology needed for potential amlodipine  restart or current Entresto  dose adjustment, Coreg  at max dose. - Adhere to current antihypertensive regimen: Entresto , carvedilol , and furosemide . - Record BP readings for next 4 days and call Tuesday with readings. - Consult cardiology for blood pressure management recommendations and potential medication adjustments. - F/U in 1 month

## 2023-11-08 NOTE — Assessment & Plan Note (Signed)
 A1c at 8.9% indicates poor glycemic control. Awaiting Medicaid approval to order SGLT-2 which will be beneficial for renal, cardiac, and glycemic management. Goal: A1c reduction to 6% and fasting glucose below 120 mg/dL, postprandial below 161. - Increase Lantus  insulin  to 36 units once daily. - Sending Metformin  500mg  ER bid. -  Continue to monitor blood glucose levels in the morning and before supper, and report abnormal readings, parameters provided. - Notify upon Medicaid approval for potential new medication. - F/U in 1 month

## 2023-12-04 ENCOUNTER — Other Ambulatory Visit (HOSPITAL_COMMUNITY): Payer: Self-pay

## 2023-12-04 ENCOUNTER — Other Ambulatory Visit: Payer: Self-pay | Admitting: Family

## 2023-12-04 ENCOUNTER — Other Ambulatory Visit: Payer: Self-pay

## 2023-12-04 ENCOUNTER — Encounter (HOSPITAL_COMMUNITY): Payer: Self-pay

## 2023-12-04 DIAGNOSIS — Z5986 Financial insecurity: Secondary | ICD-10-CM

## 2023-12-04 DIAGNOSIS — I5032 Chronic diastolic (congestive) heart failure: Secondary | ICD-10-CM

## 2023-12-04 MED ORDER — CARVEDILOL 25 MG PO TABS
25.0000 mg | ORAL_TABLET | Freq: Two times a day (BID) | ORAL | 1 refills | Status: DC
  Filled 2023-12-04: qty 60, 30d supply, fill #0
  Filled 2023-12-25 – 2024-01-09 (×2): qty 60, 30d supply, fill #1

## 2023-12-05 ENCOUNTER — Telehealth: Payer: Self-pay | Admitting: *Deleted

## 2023-12-05 NOTE — Progress Notes (Signed)
 Care Guide Pharmacy Note  12/05/2023 Name: Gary Conway MRN: [id number] DOB: 12-20-1975  Referred By: Lucius Krabbe, NP Reason for referral: Call Attempt #1 and Complex Care Management (Outreach to schedule referral with pharmacist )   Gary Conway is a 49 y.o. year old male who is a primary care patient of Lucius Krabbe, NP.  Gary Conway was referred to the pharmacist for assistance related to: med assistance  An unsuccessful telephone outreach was attempted today to contact the patient who was referred to the pharmacy team for assistance with medication assistance. Additional attempts will be made to contact the patient.  Gary Conway, CMA [place]  [facility] [place] Guide Direct Dial : [phone]  Fax: [phone] Website: [email or web address]

## 2023-12-05 NOTE — Progress Notes (Signed)
 Care Guide Pharmacy Note  12/05/2023 Name: Gary Conway MRN: [id number] DOB: 08/08/1975  Referred By: Lucius Krabbe, NP Reason for referral: Call Attempt #1 and Complex Care Management (Outreach to schedule referral with pharmacist )   Gary Conway is a 48 y.o. year old male who is a primary care patient of Lucius Krabbe, NP.  Gary Conway was referred to the pharmacist for assistance related to: med assistance   Successful contact was made with the patient to discuss pharmacy services including being ready for the pharmacist to call at least 5 minutes before the scheduled appointment time and to have medication bottles and any blood pressure readings ready for review. The patient agreed to meet with the pharmacist via telephone visit on 12/12/2023  Gary Conway, CMA [place]  [facility], [facility] Guide Direct Dial : [phone]  Fax: [phone] Website: [email or web address]

## 2023-12-06 ENCOUNTER — Other Ambulatory Visit: Payer: Self-pay

## 2023-12-06 ENCOUNTER — Encounter (HOSPITAL_COMMUNITY): Payer: Self-pay

## 2023-12-06 ENCOUNTER — Emergency Department (HOSPITAL_COMMUNITY): Payer: Self-pay

## 2023-12-06 ENCOUNTER — Emergency Department (HOSPITAL_COMMUNITY)
Admission: EM | Admit: 2023-12-06 | Discharge: 2023-12-06 | Disposition: A | Discharge status: Home / Self Care | Payer: Self-pay | Attending: Emergency Medicine | Admitting: Emergency Medicine

## 2023-12-06 DIAGNOSIS — I509 Heart failure, unspecified: Secondary | ICD-10-CM | POA: Insufficient documentation

## 2023-12-06 DIAGNOSIS — Z7984 Long term (current) use of oral hypoglycemic drugs: Secondary | ICD-10-CM | POA: Insufficient documentation

## 2023-12-06 DIAGNOSIS — R0602 Shortness of breath: Secondary | ICD-10-CM | POA: Insufficient documentation

## 2023-12-06 DIAGNOSIS — Z794 Long term (current) use of insulin: Secondary | ICD-10-CM | POA: Insufficient documentation

## 2023-12-06 DIAGNOSIS — R739 Hyperglycemia, unspecified: Secondary | ICD-10-CM

## 2023-12-06 DIAGNOSIS — D72829 Elevated white blood cell count, unspecified: Secondary | ICD-10-CM | POA: Insufficient documentation

## 2023-12-06 DIAGNOSIS — E871 Hypo-osmolality and hyponatremia: Secondary | ICD-10-CM | POA: Insufficient documentation

## 2023-12-06 DIAGNOSIS — E1165 Type 2 diabetes mellitus with hyperglycemia: Secondary | ICD-10-CM | POA: Insufficient documentation

## 2023-12-06 LAB — BASIC METABOLIC PANEL WITH GFR
Anion gap: 14 (ref 5–15)
BUN: 22 mg/dL — ABNORMAL HIGH (ref 6–20)
CO2: 21 mmol/L — ABNORMAL LOW (ref 22–32)
Calcium: 9.2 mg/dL (ref 8.9–10.3)
Chloride: 96 mmol/L — ABNORMAL LOW (ref 98–111)
Creatinine, Ser: 2.18 mg/dL — ABNORMAL HIGH (ref 0.61–1.24)
GFR, Estimated: 36 mL/min — ABNORMAL LOW (ref >60)
Glucose, Bld: 460 mg/dL — ABNORMAL HIGH (ref 70–99)
Potassium: 4.2 mmol/L (ref 3.5–5.1)
Sodium: 131 mmol/L — ABNORMAL LOW (ref 135–145)

## 2023-12-06 LAB — CBC
HCT: 38.2 % — ABNORMAL LOW (ref 39.0–52.0)
Hemoglobin: 12.6 g/dL — ABNORMAL LOW (ref 13.0–17.0)
MCH: 23.1 pg — ABNORMAL LOW (ref 26.0–34.0)
MCHC: 33 g/dL (ref 30.0–36.0)
MCV: 70 fL — ABNORMAL LOW (ref 80.0–100.0)
Platelets: 169 K/uL (ref 150–400)
RBC: 5.46 MIL/uL (ref 4.22–5.81)
RDW: 16.4 % — ABNORMAL HIGH (ref 11.5–15.5)
WBC: 10.6 K/uL — ABNORMAL HIGH (ref 4.0–10.5)
nRBC: 0 % (ref 0.0–0.2)

## 2023-12-06 LAB — CBG MONITORING, ED
Glucose-Capillary: 464 mg/dL — ABNORMAL HIGH (ref 70–99)
Glucose-Capillary: 384 mg/dL — ABNORMAL HIGH (ref 70–99)

## 2023-12-06 LAB — BRAIN NATRIURETIC PEPTIDE: B Natriuretic Peptide: 14.1 pg/mL (ref 0.0–100.0)

## 2023-12-06 LAB — TROPONIN I (HIGH SENSITIVITY): Troponin I (High Sensitivity): 12 ng/L (ref <18)

## 2023-12-06 NOTE — ED Provider Notes (Signed)
 [place] EMERGENCY DEPARTMENT AT [facility] Provider Note   CSN: [id number] Arrival date & time: 12/06/23  1725     Patient presents with: Shortness of Breath and Dizziness  HPI Gary Conway is a 48 y.o. male with PMH of CHF, type 2 diabetes presenting for shortness of breath and lightheadedness.  Started a couple of hours ago while he was running errands at the pharmacy.  States that shortness of breath came on all of a sudden with lightheadedness.  He sat down and his symptoms improved.  Denies associated chest pain.  He states overall his shortness of breath has improved considerably.  He notes some mild lower extremity swelling but no worse than usual.  States he has been taking his Lasix .  Also mention that he did not take his Lantus  as he takes it in the afternoon after work because he came here for evaluation.    Shortness of Breath Dizziness Associated symptoms: shortness of breath        Prior to Admission medications   Medication Sig Start Date End Date Taking? Authorizing Provider  albuterol  (VENTOLIN  HFA) 108 (90 Base) MCG/ACT inhaler Inhale 2 puffs into the lungs every 6 (six) hours as needed for wheezing or shortness of breath. 10/11/23   Lucius Krabbe, NP  blood glucose meter kit and supplies KIT Dispense based on patient and insurance preference. Use up to four times daily as directed. (FOR ICD-9 250.00, 250.01). For QAC - HS accuchecks. 09/14/19   Singh, Prashant K, MD  Blood Glucose Monitoring Suppl (BLOOD GLUCOSE MONITOR SYSTEM) w/Device KIT Use to test blood sugar in the morning, at noon, and at bedtime. 04/08/23   Singh, Prashant K, MD  carvedilol  (COREG ) 25 MG tablet Take 1 tablet (25 mg total) by mouth 2 (two) times daily with a meal. 12/04/23   Lucius Krabbe, NP  cyanocobalamin  (VITAMIN B12) 1000 MCG tablet Take 1 tablet (1,000 mcg total) by mouth daily. 10/11/23   Lucius Krabbe, NP  ferrous sulfate  325 (65 FE) MG EC tablet Take 1 tablet  (325 mg total) by mouth daily with breakfast. 10/11/23   Lucius Krabbe, NP  folic acid  (FOLVITE ) 1 MG tablet Take 1 tablet (1 mg total) by mouth daily. 10/11/23   Lucius Krabbe, NP  furosemide  (LASIX ) 40 MG tablet Take 1 tablet (40 mg total) by mouth every morning. 10/11/23   Lucius Krabbe, NP  insulin  glargine (LANTUS  SOLOSTAR) 100 UNIT/ML Solostar Pen Inject 36 Units into the skin daily. 11/08/23   Lucius Krabbe, NP  Insulin  Pen Needle 32G X 4 MM MISC For 4 times a day insulin  Subcutaneously. 04/08/23   Singh, Prashant K, MD  metFORMIN  (GLUCOPHAGE -XR) 500 MG 24 hr tablet Take 1 tablet (500 mg total) by mouth 2 (two) times daily. 11/08/23   Lucius Krabbe, NP  mometasone -formoterol  (DULERA ) 200-5 MCG/ACT AERO Inhale 2 puffs into the lungs 2 (two) times daily. 08/02/23   Elgergawy, Brayton RAMAN, MD  sacubitril -valsartan  (ENTRESTO ) 49-51 MG Take 1 tablet by mouth 2 (two) times daily. 10/11/23   Lucius Krabbe, NP  spironolactone  (ALDACTONE ) 25 MG tablet Take 0.5 tablets (12.5 mg total) by mouth daily. 10/11/23   Lucius Krabbe, NP    Allergies: Food and Mustard    Review of Systems  Respiratory:  Positive for shortness of breath.   Neurological:  Positive for dizziness.    Updated Vital Signs BP 91/66   Pulse 92   Temp 98.3 F (36.8 C) (Tympanic)   Resp (!) 23  Ht 5' 8 (1.727 m)   Wt 86.2 kg   SpO2 96%   BMI 28.89 kg/m   Physical Exam Vitals and nursing note reviewed.  Constitutional:      Appearance: Normal appearance.  HENT:     Head: Normocephalic and atraumatic.     Nose: Nose normal.     Mouth/Throat:     Mouth: Mucous membranes are moist.  Eyes:     General:        Right eye: No discharge.        Left eye: No discharge.     Conjunctiva/sclera: Conjunctivae normal.  Cardiovascular:     Rate and Rhythm: Normal rate and regular rhythm.     Pulses: Normal pulses.     Heart sounds: Normal heart sounds.  Pulmonary:     Effort: Pulmonary effort is  normal.     Breath sounds: Normal breath sounds and air entry. No decreased breath sounds, wheezing, rhonchi or rales.  Abdominal:     General: Abdomen is flat.     Palpations: Abdomen is soft.  Skin:    General: Skin is warm and dry.  Neurological:     General: No focal deficit present.     Mental Status: He is alert.     Comments: GCS 15. Speech is goal oriented. No deficits appreciated to CN III-XII; symmetric eyebrow raise, no facial drooping, tongue midline. Patient has equal grip strength bilaterally with 5/5 strength against resistance in all major muscle groups bilaterally. Sensation to light touch intact. Patient moves extremities without ataxia. Normal finger-nose-finger. Patient ambulatory with steady gait.  Psychiatric:        Mood and Affect: Mood normal.     (all labs ordered are listed, but only abnormal results are displayed) Labs Reviewed  BASIC METABOLIC PANEL WITH GFR - Abnormal; Notable for the following components:      Result Value   Sodium 131 (*)    Chloride 96 (*)    CO2 21 (*)    Glucose, Bld 460 (*)    BUN 22 (*)    Creatinine, Ser 2.18 (*)    GFR, Estimated 36 (*)    All other components within normal limits  CBC - Abnormal; Notable for the following components:   WBC 10.6 (*)    Hemoglobin 12.6 (*)    HCT 38.2 (*)    MCV 70.0 (*)    MCH 23.1 (*)    RDW 16.4 (*)    All other components within normal limits  CBG MONITORING, ED - Abnormal; Notable for the following components:   Glucose-Capillary 464 (*)    All other components within normal limits  CBG MONITORING, ED - Abnormal; Notable for the following components:   Glucose-Capillary 384 (*)    All other components within normal limits  BRAIN NATRIURETIC PEPTIDE  TROPONIN I (HIGH SENSITIVITY)    EKG: EKG Interpretation Date/Time:  Friday December 06 2023 17:42:58 EDT Ventricular Rate:  90 PR Interval:  146 QRS Duration:  78 QT Interval:  350 QTC Calculation: 428 R Axis:   65  Text  Interpretation: Normal sinus rhythm Normal ECG When compared with ECG of 06-Aug-2023 14:35, PREVIOUS ECG IS PRESENT Confirmed by Cleotilde Rogue ([id number]) on 12/06/2023 5:56:45 PM  Radiology: [facility] Chest 2 View Result Date: 12/06/2023 CLINICAL DATA:  Shortness of breath EXAM: CHEST - 2 VIEW COMPARISON:  CT chest 07/29/2023 FINDINGS: There is stable mild elevation of the left hemidiaphragm. The heart size and mediastinal contours are  within normal limits. Both lungs are clear. The visualized skeletal structures are unremarkable. IMPRESSION: No active cardiopulmonary disease. Electronically Signed   By: Greig Pique M.D.   On: 12/06/2023 19:26     Procedures   Medications Ordered in the ED - No data to display                                  Medical Decision Making Amount and/or Complexity of Data Reviewed Labs: ordered. Radiology: ordered.   Initial Impression and Ddx 48 year old well-appearing male presenting for shortness of breath and lightheadedness.  Exam was unremarkable.  DDx includes CHF exacerbation, COPD exacerbation, ACS, PE, stroke, electrolyte derangement, other. Patient PMH that increases complexity of ED encounter:  CHF, type 2 diabetes  Interpretation of Diagnostics - I independent reviewed and interpreted the labs as followed: hyperglycemia (460 then 384 on recheck), hyponatremia, leukocytosis  - I independently visualized the following imaging with scope of interpretation limited to determining acute life threatening conditions related to emergency care: CXR, which revealed no acute findings  - I personally reviewed and interpreted the EKG which revealed normal sinus rhythm.  Patient Reassessment and Ultimate Disposition/Management Workup was reassuring.  Suspected CHF but unlikely given no notable lower extremity edema, labs are reassuring and shortness of breath was minimal on arrival and improved on reassessment.  Also considered PE but unlikely given no chest pain and  he is PERC negative.  Workup does not support ACS.  Noted to be hyperglycemic but blood sugar was improved below 400s without intervention on recheck.  Suspect it is related to him not taking his Lantus  this afternoon.  I advised him to take it when he returned home and resume his normal schedule tomorrow.  He assured me that he did have insulin  in appropriate supplies at home.  Advised him to follow-up with PCP.  Discussed return precautions.  Discharged.  Patient management required discussion with the following services or consulting groups:  None  Complexity of Problems Addressed Acute complicated illness or Injury  Additional Data Reviewed and Analyzed Further history obtained from: Past medical history and medications listed in the EMR and Prior ED visit notes  Patient Encounter Risk Assessment Consideration of hospitalization       Final diagnoses:  Shortness of breath  Hyperglycemia    ED Discharge Orders     None          Lang Norleen POUR, PA-C 12/06/23 2221    Ruthe Cornet, DO 12/06/23 2307

## 2023-12-06 NOTE — Discharge Instructions (Addendum)
 Evaluation today was overall reassuring.  Please follow-up your PCP.  If your symptoms worsen in any way please return to the ED for further evaluation.  Your blood sugar was elevated during this counter.  Recommend that you take your insulin  when you return home and resume your normal schedule tomorrow.

## 2023-12-06 NOTE — ED Triage Notes (Signed)
 Pt to ED c/o dizziness and SHOB that just started. Reports hx of the same. Reports this occurred when at the pharmacy running errands. NAD in triage.

## 2023-12-09 ENCOUNTER — Emergency Department (HOSPITAL_COMMUNITY)
Admission: EM | Admit: 2023-12-09 | Discharge: 2023-12-09 | Disposition: A | Discharge status: Home / Self Care | Payer: Self-pay | Attending: Emergency Medicine | Admitting: Emergency Medicine

## 2023-12-09 ENCOUNTER — Encounter (HOSPITAL_COMMUNITY): Payer: Self-pay

## 2023-12-09 ENCOUNTER — Other Ambulatory Visit: Payer: Self-pay

## 2023-12-09 ENCOUNTER — Emergency Department (HOSPITAL_COMMUNITY): Payer: Self-pay

## 2023-12-09 DIAGNOSIS — I509 Heart failure, unspecified: Secondary | ICD-10-CM | POA: Insufficient documentation

## 2023-12-09 DIAGNOSIS — R739 Hyperglycemia, unspecified: Secondary | ICD-10-CM

## 2023-12-09 DIAGNOSIS — Z794 Long term (current) use of insulin: Secondary | ICD-10-CM | POA: Insufficient documentation

## 2023-12-09 DIAGNOSIS — E1165 Type 2 diabetes mellitus with hyperglycemia: Secondary | ICD-10-CM | POA: Insufficient documentation

## 2023-12-09 DIAGNOSIS — H538 Other visual disturbances: Secondary | ICD-10-CM | POA: Insufficient documentation

## 2023-12-09 LAB — COMPREHENSIVE METABOLIC PANEL WITH GFR
ALT: 15 U/L (ref 0–44)
AST: 17 U/L (ref 15–41)
Albumin: 4.4 g/dL (ref 3.5–5.0)
Alkaline Phosphatase: 73 U/L (ref 38–126)
Anion gap: 11 (ref 5–15)
BUN: 16 mg/dL (ref 6–20)
CO2: 24 mmol/L (ref 22–32)
Calcium: 10 mg/dL (ref 8.9–10.3)
Chloride: 95 mmol/L — ABNORMAL LOW (ref 98–111)
Creatinine, Ser: 1.63 mg/dL — ABNORMAL HIGH (ref 0.61–1.24)
GFR, Estimated: 51 mL/min — ABNORMAL LOW (ref >60)
Glucose, Bld: 455 mg/dL — ABNORMAL HIGH (ref 70–99)
Potassium: 4.6 mmol/L (ref 3.5–5.1)
Sodium: 130 mmol/L — ABNORMAL LOW (ref 135–145)
Total Bilirubin: 0.9 mg/dL (ref 0.0–1.2)
Total Protein: 9.1 g/dL — ABNORMAL HIGH (ref 6.5–8.1)

## 2023-12-09 LAB — I-STAT CHEM 8, ED
BUN: 22 mg/dL — ABNORMAL HIGH (ref 6–20)
Calcium, Ion: 1.19 mmol/L (ref 1.15–1.40)
Chloride: 95 mmol/L — ABNORMAL LOW (ref 98–111)
Creatinine, Ser: 1.6 mg/dL — ABNORMAL HIGH (ref 0.61–1.24)
Glucose, Bld: 459 mg/dL — ABNORMAL HIGH (ref 70–99)
HCT: 46 % (ref 39.0–52.0)
Hemoglobin: 15.6 g/dL (ref 13.0–17.0)
Potassium: 4.7 mmol/L (ref 3.5–5.1)
Sodium: 132 mmol/L — ABNORMAL LOW (ref 135–145)
TCO2: 24 mmol/L (ref 22–32)

## 2023-12-09 LAB — CBC
HCT: 42.2 % (ref 39.0–52.0)
Hemoglobin: 13.7 g/dL (ref 13.0–17.0)
MCH: 23 pg — ABNORMAL LOW (ref 26.0–34.0)
MCHC: 32.5 g/dL (ref 30.0–36.0)
MCV: 70.8 fL — ABNORMAL LOW (ref 80.0–100.0)
Platelets: 186 K/uL (ref 150–400)
RBC: 5.96 MIL/uL — ABNORMAL HIGH (ref 4.22–5.81)
RDW: 16.7 % — ABNORMAL HIGH (ref 11.5–15.5)
WBC: 9.1 K/uL (ref 4.0–10.5)
nRBC: 0 % (ref 0.0–0.2)

## 2023-12-09 LAB — I-STAT VENOUS BLOOD GAS, ED
Acid-Base Excess: 2 mmol/L (ref 0.0–2.0)
Bicarbonate: 27 mmol/L (ref 20.0–28.0)
Calcium, Ion: 1.21 mmol/L (ref 1.15–1.40)
HCT: 43 % (ref 39.0–52.0)
Hemoglobin: 14.6 g/dL (ref 13.0–17.0)
O2 Saturation: 66 %
Potassium: 4.6 mmol/L (ref 3.5–5.1)
Sodium: 131 mmol/L — ABNORMAL LOW (ref 135–145)
TCO2: 28 mmol/L (ref 22–32)
pCO2, Ven: 42.7 mmHg — ABNORMAL LOW (ref 44–60)
pH, Ven: 7.409 (ref 7.25–7.43)
pO2, Ven: 34 mmHg (ref 32–45)

## 2023-12-09 LAB — CBG MONITORING, ED
Glucose-Capillary: 439 mg/dL — ABNORMAL HIGH (ref 70–99)
Glucose-Capillary: 451 mg/dL — ABNORMAL HIGH (ref 70–99)
Glucose-Capillary: 437 mg/dL — ABNORMAL HIGH (ref 70–99)
Glucose-Capillary: 371 mg/dL — ABNORMAL HIGH (ref 70–99)

## 2023-12-09 LAB — URINALYSIS, ROUTINE W REFLEX MICROSCOPIC
Bacteria, UA: NONE SEEN
Bilirubin Urine: NEGATIVE
Glucose, UA: >=500 mg/dL — AB
Hgb urine dipstick: NEGATIVE
Ketones, ur: NEGATIVE mg/dL
Leukocytes,Ua: NEGATIVE
Nitrite: NEGATIVE
Protein, ur: NEGATIVE mg/dL
Specific Gravity, Urine: 1.021 (ref 1.005–1.030)
pH: 6 (ref 5.0–8.0)

## 2023-12-09 LAB — BETA-HYDROXYBUTYRIC ACID: Beta-Hydroxybutyric Acid: 0.11 mmol/L (ref 0.05–0.27)

## 2023-12-09 LAB — MAGNESIUM: Magnesium: 2.3 mg/dL (ref 1.7–2.4)

## 2023-12-09 MED ORDER — LACTATED RINGERS IV BOLUS
1000.0000 mL | Freq: Once | INTRAVENOUS | Status: AC
  Administered 2023-12-09: 1000 mL via INTRAVENOUS

## 2023-12-09 MED ORDER — INSULIN ASPART PROT & ASPART (70-30 MIX) 100 UNIT/ML ~~LOC~~ SUSP
8.0000 [IU] | Freq: Once | SUBCUTANEOUS | Status: AC
  Administered 2023-12-09: 8 [IU] via SUBCUTANEOUS
  Filled 2023-12-09: qty 10

## 2023-12-09 NOTE — ED Notes (Signed)
 Awaiting patient from lobby.

## 2023-12-09 NOTE — ED Provider Triage Note (Signed)
 Emergency Medicine Provider Triage Evaluation Note  Gary Conway , a 48 y.o. male  was evaluated in triage.  Pt complains of hyperglycemia, hypertension.  Patient was at his worksite earlier and complained of some blurred vision bilaterally, checked his blood pressure which she described as high, he also checked his blood sugar which came back with a high reading.  He is on metformin  and Lantus  and is compliant with these medications.  He has eaten very little today but has been very thirsty and has been complaining of oliguria.  Denies headache, abdominal pain, chest pain, shortness of breath however he does endorse a history of CHF.  Review of Systems  Positive: As above Negative: As above  Physical Exam  BP (!) 136/96 (BP Location: Right Arm)   Pulse (!) 113   Temp 98.6 F (37 C)   Resp 18   Ht 5' 8 (1.727 m)   Wt 88.5 kg   SpO2 96%   BMI 29.65 kg/m  Gen:   Awake, no distress   Resp:  Normal effort  MSK:   Moves extremities without difficulty  Other:    Medical Decision Making  Medically screening exam initiated at 5:43 PM.  Appropriate orders placed.  Luisangel Bowler was informed that the remainder of the evaluation will be completed by another provider, this initial triage assessment does not replace that evaluation, and the importance of remaining in the ED until their evaluation is complete.     Glendia Rocky SAILOR, [place] 12/09/23 1745

## 2023-12-09 NOTE — ED Triage Notes (Addendum)
 Pt came in via POV d/t his vision becoming blurry & his CBG was reading High while at work. Reports he has been taking his medications with no missed doses & feeling very thirsty. A/Ox4, denies current pain.

## 2023-12-09 NOTE — ED Notes (Signed)
 Patient transported to CT

## 2023-12-09 NOTE — ED Notes (Signed)
 Abnoral vbg reults given to erin s.pa by at

## 2023-12-09 NOTE — Discharge Instructions (Signed)
 You were seen today for blurred vision and hyperglycemia. While you were here we monitored your vitals, performed a physical exam, and labs. These were all reassuring and there is no indication for any further testing or intervention in the emergency department at this time.   Things to do:  - Follow up with your primary care provider within the next 1-2 weeks - Call the above number to schedule appointment with ophthalmologist for further evaluation of concern for diabetic retinopathy - Ensure that you are using your home insulin  every day as prescribed  Return to the emergency department if you have any new or worsening symptoms, or if you have any other serious medical concerns.

## 2023-12-09 NOTE — ED Provider Notes (Signed)
  EMERGENCY DEPARTMENT AT [facility] Provider Note   CSN: [id number] Arrival date & time: 12/09/23  1521   Patient presents with: Hyperglycemia   Gary Conway is a 48 y.o. male with PMHx of CHF and IDDM2 who presents for evaluation of blurred vision that began while at work earlier.  Patient states that when he checked his BG at work it was read as high.  He has not taken his insulin  today with breakfast, and did not take anything else today as he presented right from work.  States that he took all of his insulin  as prescribed yesterday.  Patient denies any associated chest pain, headache, or confusion associated with his blurred vision today.  Also denies recent N/V, fevers, chills.  States that he has had increased thirst from his baseline over the last several days and has been peeing more frequently though he attributes this to his Lasix , denies any abnormal appearing/smelling urine.  He states that his blurred almost completely resolved.  He does occasional heavy lifting at work but today was just packing a box when the blurred vision began. He has never seen an ophthalmologist in regards to his diabetes.     Prior to Admission medications   Medication Sig Start Date End Date Taking? Authorizing Provider  albuterol  (VENTOLIN  HFA) 108 (90 Base) MCG/ACT inhaler Inhale 2 puffs into the lungs every 6 (six) hours as needed for wheezing or shortness of breath. 10/11/23   Lucius Krabbe, NP  blood glucose meter kit and supplies KIT Dispense based on patient and insurance preference. Use up to four times daily as directed. (FOR ICD-9 250.00, 250.01). For QAC - HS accuchecks. 09/14/19   Singh, Prashant K, MD  Blood Glucose Monitoring Suppl (BLOOD GLUCOSE MONITOR SYSTEM) w/Device KIT Use to test blood sugar in the morning, at noon, and at bedtime. 04/08/23   Singh, Prashant K, MD  carvedilol  (COREG ) 25 MG tablet Take 1 tablet (25 mg total) by mouth 2 (two) times daily with a  meal. 12/04/23   Lucius Krabbe, NP  cyanocobalamin  (VITAMIN B12) 1000 MCG tablet Take 1 tablet (1,000 mcg total) by mouth daily. 10/11/23   Lucius Krabbe, NP  ferrous sulfate  325 (65 FE) MG EC tablet Take 1 tablet (325 mg total) by mouth daily with breakfast. 10/11/23   Lucius Krabbe, NP  folic acid  (FOLVITE ) 1 MG tablet Take 1 tablet (1 mg total) by mouth daily. 10/11/23   Lucius Krabbe, NP  furosemide  (LASIX ) 40 MG tablet Take 1 tablet (40 mg total) by mouth every morning. 10/11/23   Lucius Krabbe, NP  insulin  glargine (LANTUS  SOLOSTAR) 100 UNIT/ML Solostar Pen Inject 36 Units into the skin daily. 11/08/23   Lucius Krabbe, NP  Insulin  Pen Needle 32G X 4 MM MISC For 4 times a day insulin  Subcutaneously. 04/08/23   Singh, Prashant K, MD  metFORMIN  (GLUCOPHAGE -XR) 500 MG 24 hr tablet Take 1 tablet (500 mg total) by mouth 2 (two) times daily. 11/08/23   Lucius Krabbe, NP  mometasone -formoterol  (DULERA ) 200-5 MCG/ACT AERO Inhale 2 puffs into the lungs 2 (two) times daily. 08/02/23   Elgergawy, Brayton RAMAN, MD  sacubitril -valsartan  (ENTRESTO ) 49-51 MG Take 1 tablet by mouth 2 (two) times daily. 10/11/23   Lucius Krabbe, NP  spironolactone  (ALDACTONE ) 25 MG tablet Take 0.5 tablets (12.5 mg total) by mouth daily. 10/11/23   Lucius Krabbe, NP    Allergies: Food and Mustard     Updated Vital Signs BP (!) 124/92   Pulse  100   Temp 97.8 F (36.6 C) (Oral)   Resp 20   Ht 5' 8 (1.727 m)   Wt 88.5 kg   SpO2 98%   BMI 29.65 kg/m   Physical Exam Vitals reviewed.  Constitutional:      General: He is not in acute distress.    Appearance: He is not toxic-appearing or diaphoretic.  HENT:     Head: Normocephalic and atraumatic.     Nose: Nose normal.     Mouth/Throat:     Mouth: Mucous membranes are moist.     Pharynx: Oropharynx is clear. No oropharyngeal exudate.  Eyes:     General: Vision grossly intact. Gaze aligned appropriately.     Extraocular Movements:  Extraocular movements intact.     Conjunctiva/sclera: Conjunctivae normal.     Pupils: Pupils are equal, round, and reactive to light.  Cardiovascular:     Rate and Rhythm: Normal rate and regular rhythm.     Pulses: Normal pulses.     Heart sounds: No murmur heard.    No gallop.  Pulmonary:     Effort: Pulmonary effort is normal. No respiratory distress.     Breath sounds: Normal breath sounds.  Abdominal:     General: Abdomen is flat.     Palpations: Abdomen is soft.     Tenderness: There is no abdominal tenderness. There is no right CVA tenderness, left CVA tenderness or guarding.  Musculoskeletal:        General: No deformity. Normal range of motion.     Cervical back: Normal range of motion and neck supple. No rigidity or tenderness.     Right lower leg: No edema.     Left lower leg: No edema.  Skin:    General: Skin is warm and dry.     Capillary Refill: Capillary refill takes less than 2 seconds.     Findings: No rash.  Neurological:     General: No focal deficit present.     Mental Status: He is alert and oriented to person, place, and time. Mental status is at baseline.     Cranial Nerves: No cranial nerve deficit.     Sensory: No sensory deficit.     Motor: No weakness.     (all labs ordered are listed, but only abnormal results are displayed) Labs Reviewed  CBC - Abnormal; Notable for the following components:      Result Value   RBC 5.96 (*)    MCV 70.8 (*)    MCH 23.0 (*)    RDW 16.7 (*)    All other components within normal limits  URINALYSIS, ROUTINE W REFLEX MICROSCOPIC - Abnormal; Notable for the following components:   Color, Urine STRAW (*)    Glucose, UA >=500 (*)    All other components within normal limits  COMPREHENSIVE METABOLIC PANEL WITH GFR - Abnormal; Notable for the following components:   Sodium 130 (*)    Chloride 95 (*)    Glucose, Bld 455 (*)    Creatinine, Ser 1.63 (*)    Total Protein 9.1 (*)    GFR, Estimated 51 (*)    All other  components within normal limits  CBG MONITORING, ED - Abnormal; Notable for the following components:   Glucose-Capillary 439 (*)    All other components within normal limits  I-STAT VENOUS BLOOD GAS, ED - Abnormal; Notable for the following components:   pCO2, Ven 42.7 (*)    Sodium 131 (*)  All other components within normal limits  CBG MONITORING, ED - Abnormal; Notable for the following components:   Glucose-Capillary 451 (*)    All other components within normal limits  I-STAT CHEM 8, ED - Abnormal; Notable for the following components:   Sodium 132 (*)    Chloride 95 (*)    BUN 22 (*)    Creatinine, Ser 1.60 (*)    Glucose, Bld 459 (*)    All other components within normal limits  CBG MONITORING, ED - Abnormal; Notable for the following components:   Glucose-Capillary 437 (*)    All other components within normal limits  CBG MONITORING, ED - Abnormal; Notable for the following components:   Glucose-Capillary 371 (*)    All other components within normal limits  BETA-HYDROXYBUTYRIC ACID  MAGNESIUM  CBG MONITORING, ED    EKG: None  Radiology: CT Head Wo Contrast Result Date: 12/09/2023 CLINICAL DATA:  blurred vision, HTN/hyperglycemia EXAM: CT HEAD WITHOUT CONTRAST TECHNIQUE: Contiguous axial images were obtained from the base of the skull through the vertex without intravenous contrast. RADIATION DOSE REDUCTION: This exam was performed according to the departmental dose-optimization program which includes automated exposure control, adjustment of the mA and/or kV according to patient size and/or use of iterative reconstruction technique. COMPARISON:  None Available. FINDINGS: Brain: No evidence of large-territorial acute infarction. No parenchymal hemorrhage. No mass lesion. No extra-axial collection. No mass effect or midline shift. No hydrocephalus. Basilar cisterns are patent. Vascular: No hyperdense vessel. Skull: No acute fracture or focal lesion. Sinuses/Orbits:  Paranasal sinuses and mastoid air cells are clear. The orbits are unremarkable. Other: None. IMPRESSION: No acute intracranial abnormality. Electronically Signed   By: Morgane  Naveau M.D.   On: 12/09/2023 19:01    Medications Ordered in the ED  lactated ringers  bolus 1,000 mL (0 mLs Intravenous Stopped 12/09/23 2109)  insulin  aspart protamine- aspart (NOVOLOG  MIX 70/30) injection 8 Units (8 Units Subcutaneous Given 12/09/23 2006)    Clinical Course as of 12/10/23 0031  Mon Dec 09, 2023  1818 Creatinine(!): 1.60 Baseline ~1.5 per recent hospitalization [AD]  1818 Glucose(!): 459 [AD]  1831 CBC(!) unremarkable [AD]  1831 I-Stat venous blood gas, ([facility], [facility], [facility])(!) pH 7.409, overall no acute abnormalities [AD]  1904 CT Head Wo Contrast No acute intracranial abnormality. [AD]  1905 Beta-Hydroxybutyric Acid: 0.11 [AD]  1921 Glucose-Capillary(!): 437 Not improved after liter of fluids.  Will give 8 units NovoLog . [AD]  Tue Dec 10, 2023  0019 Glucose-Capillary(!): 371 [AD]    Clinical Course User Index [AD] Raoul Rake, MD    Medical Decision Making Patient with the above history presenting with transient blurred vision in the setting of undetectably high glucose reading while at work earlier today. Patient has a history of poorly-controlled BG despite endorsing compliance with his home insulin . He denies any head injury/trauma prior to onset of the blurred vision and otherwise had no associated chest pain, dizziness, headache, or AMS. By time of initial evaluation, blurred vision had resolved and patient had no focal exam abnormalities. His initial CBG was 459. Most likely etiology of his blurred vision is diabetic retinopathy given his longstanding history of poorly-controlled DM and no prior ophtho evaluation. Patient has no vision abnormalities on exam and states his symptoms have since resolved, and given that he had equal bilateral blurred vision but no specific visual field  deficit, this does not localize well to be concerning for an acute stroke, though will get CT head.  Given his  history, elevated BG, and patient-reported increased thirst recently, concerned as well for DKA. Will get BHB, VBG, and basic labs.  As above in ED course, labs are unremarkable for DKA. CT head also unremarkable. Patient given fluid bolus and 8U of novolog  for his hyperglycemia which improved. Provided patient with clinic info to schedule close f/u with ophthalmology, and reinforced importance of adherence to his home insulin  and close PCP f/u for further management of his regimen.  Amount and/or Complexity of Data Reviewed Labs: ordered. Decision-making details documented in ED Course. Radiology:  Decision-making details documented in ED Course.  Risk Prescription drug management.    Final diagnoses:  Hyperglycemia  Blurred vision    ED Discharge Orders     None          Raoul Rake, MD 12/10/23 0031    Ruthe Cornet, DO 12/10/23 1519    Curatolo, Adam, DO 01/09/24 [id number]

## 2023-12-09 NOTE — ED Provider Notes (Signed)
 Patient here with high blood sugar.  Supervised resident visit.  Lab work shows hyperglycemia but patient is not in DKA.  No source for infection or other acute process.  He says some chronic blurred vision.  Head CT is normal.  Will have him follow-up with ophthalmology and his primary care doctor.  Will get his blood sugar better at discharge.  This chart was dictated using voice recognition software.  Despite best efforts to proofread,  errors can occur which can change the documentation meaning.    Ruthe Cornet, DO 12/09/23 2045

## 2023-12-11 ENCOUNTER — Ambulatory Visit: Payer: Self-pay | Admitting: Family

## 2023-12-12 ENCOUNTER — Encounter: Payer: Self-pay | Admitting: Family

## 2023-12-12 ENCOUNTER — Other Ambulatory Visit: Payer: Self-pay | Admitting: Pharmacist

## 2023-12-12 ENCOUNTER — Ambulatory Visit (INDEPENDENT_AMBULATORY_CARE_PROVIDER_SITE_OTHER): Payer: Self-pay | Admitting: Family

## 2023-12-12 ENCOUNTER — Telehealth: Payer: Self-pay | Admitting: Pharmacist

## 2023-12-12 ENCOUNTER — Encounter: Payer: Self-pay | Admitting: Pharmacist

## 2023-12-12 ENCOUNTER — Telehealth: Payer: Self-pay

## 2023-12-12 VITALS — BP 118/82 | HR 98 | Temp 97.5°F | Ht 68.0 in | Wt 188.4 lb

## 2023-12-12 DIAGNOSIS — E1169 Type 2 diabetes mellitus with other specified complication: Secondary | ICD-10-CM

## 2023-12-12 DIAGNOSIS — Z794 Long term (current) use of insulin: Secondary | ICD-10-CM

## 2023-12-12 DIAGNOSIS — I152 Hypertension secondary to endocrine disorders: Secondary | ICD-10-CM

## 2023-12-12 DIAGNOSIS — E669 Obesity, unspecified: Secondary | ICD-10-CM

## 2023-12-12 DIAGNOSIS — E1159 Type 2 diabetes mellitus with other circulatory complications: Secondary | ICD-10-CM

## 2023-12-12 DIAGNOSIS — Z7984 Long term (current) use of oral hypoglycemic drugs: Secondary | ICD-10-CM

## 2023-12-12 DIAGNOSIS — Z6830 Body mass index (BMI) 30.0-30.9, adult: Secondary | ICD-10-CM | POA: Insufficient documentation

## 2023-12-12 LAB — LIPID PANEL
Cholesterol: 208 mg/dL — ABNORMAL HIGH (ref 0–200)
HDL: 32.2 mg/dL — ABNORMAL LOW (ref >39.00)
NonHDL: 176.05
Total CHOL/HDL Ratio: 6
Triglycerides: 542 mg/dL — ABNORMAL HIGH (ref 0.0–149.0)
VLDL: 108.4 mg/dL — ABNORMAL HIGH (ref 0.0–40.0)

## 2023-12-12 LAB — MICROALBUMIN / CREATININE URINE RATIO
Creatinine,U: 72.3 mg/dL
Microalb Creat Ratio: 41.7 mg/g — ABNORMAL HIGH (ref 0.0–30.0)
Microalb, Ur: 3 mg/dL — ABNORMAL HIGH (ref 0.0–1.9)

## 2023-12-12 LAB — COMPREHENSIVE METABOLIC PANEL WITH GFR
ALT: 20 U/L (ref 0–53)
AST: 18 U/L (ref 0–37)
Albumin: 4.3 g/dL (ref 3.5–5.2)
Alkaline Phosphatase: 78 U/L (ref 39–117)
BUN: 25 mg/dL — ABNORMAL HIGH (ref 6–23)
CO2: 29 meq/L (ref 19–32)
Calcium: 9.6 mg/dL (ref 8.4–10.5)
Chloride: 88 meq/L — ABNORMAL LOW (ref 96–112)
Creatinine, Ser: 1.84 mg/dL — ABNORMAL HIGH (ref 0.40–1.50)
GFR: 42.42 mL/min — ABNORMAL LOW (ref >60.00)
Glucose, Bld: 540 mg/dL (ref 70–99)
Potassium: 4.4 meq/L (ref 3.5–5.1)
Sodium: 127 meq/L — ABNORMAL LOW (ref 135–145)
Total Bilirubin: 0.7 mg/dL (ref 0.2–1.2)
Total Protein: 7.5 g/dL (ref 6.0–8.3)

## 2023-12-12 LAB — LDL CHOLESTEROL, DIRECT: Direct LDL: 42 mg/dL

## 2023-12-12 MED ORDER — LANTUS SOLOSTAR 100 UNIT/ML ~~LOC~~ SOPN: 20.0000 [IU] | PEN_INJECTOR | Freq: Two times a day (BID) | SUBCUTANEOUS | Status: DC

## 2023-12-12 NOTE — Assessment & Plan Note (Signed)
 Persistent hyperglycemia with blurry vision, thirst,  despite Lantus  insulin  36u qd and metformin . Seen in ED twice in last week. Work is very hot, sweating continuously. Possible insufficient endogenous insulin  production. High blood sugar may contribute to fatigue. - Increase Lantus  to 20 units in the morning and 20 units at night after meals. - Check C-peptide level to assess endogenous insulin  production. - Check CMP and lipid panel today, pt is fasting - Given phone # to call for free retinal eye screening - Advise hydration: 2.5 liters of water daily, avoid overhydration. - Monitor blood sugar levels before meals and before supper and record. - Advise low-carbohydrate diet, avoid white bread and high-glycemic foods. - Consider discontinuing metformin  if C-peptide indicates type 1 diabetes. - Monitor for potential overnight hypoglycemia after insulin  adjustment. - Will call to get readings in 2-3 days

## 2023-12-12 NOTE — Telephone Encounter (Signed)
 Attempt was made to contact patient by phone today by Clinical Pharmacist regarding initial consult for medication access assessment Unable to reach patient. LM on VM with my contact number [phone].

## 2023-12-12 NOTE — Progress Notes (Signed)
 Patient ID: Gary Conway, male    DOB: Jul 07, 1975, 48 y.o.   MRN: [id number]  Chief Complaint  Patient presents with   Hypertension   Diabetes  7a-3:30p work - works inside, but hot conditions -   Discussed the use of AI scribe software for clinical note transcription with the patient, who gave verbal consent to proceed.  History of Present Illness Gary Conway is a 48 year old male with diabetes who presents with high blood sugar and symptoms of dehydration.  Hyperglycemia and dehydration - Home glucose monitor consistently reads 'high' over the past week - Extreme thirst and symptoms of dehydration, possibly worsened by hot weather - Drinks two to three bottles of water at work but continues to sweat profusely - May require increased fluid intake - Feels sleepy, attributing this to high blood sugar levels  Syncope and visual disturbance - Two recent emergency room visits for fainting and blurry vision - Both episodes associated with elevated blood glucose and blood pressure readings  Peripheral neuropathy - Persistent numbness and tingling in both feet - Some relief with use of extra cushion foot insoles  Antihyperglycemic and antihypertensive medication adherence - Takes 36 units of insulin  once daily, usually around 3 PM - Takes metformin  twice daily - Blood sugar remains uncontrolled despite current regimen - Has not received Entresto  due to insurance issues and is in the process of switching to [employer] - Has not taken blood pressure medication today  Occupational and environmental factors - Work environment is hot, contributing to dehydration and excessive sweating - Brings bottled water to work due to concerns about workplace water supply cleanliness  Assessment & Plan Diabetes Mellitus with Hyperglycemia Persistent hyperglycemia with blurry vision, thirst,  despite Lantus  insulin  36u qd and metformin . Seen in ED twice in last week. Work is very hot,  sweating continuously. Possible insufficient endogenous insulin  production. High blood sugar may contribute to fatigue. - Increase Lantus  to 20 units in the morning and 20 units at night after meals. - Check C-peptide level to assess endogenous insulin  production. - Check CMP and lipid panel today, pt is fasting - Given phone # to call for free retinal eye screening - Advise hydration: 2.5 liters of water daily, avoid overhydration. - Monitor blood sugar levels before meals and before supper and record. - Advise low-carbohydrate diet, avoid white bread and high-glycemic foods. - Consider discontinuing metformin  if C-peptide indicates type 1 diabetes. - Monitor for potential overnight hypoglycemia after insulin  adjustment. - Work note provided, go back in 2 days - Will call to get readings in 2-3 days  Diabetic Neuropathy Persistent numbness and tingling in feet, worsened by high blood sugar. Has to wear steel toe boots for work. Emphasized blood sugar control to alleviate symptoms. - Recommend special foot insoles for cushioning- reports started wearing new cushion insoles recently which are helping some.  - Advise close monitoring of blood sugar levels to reduce neuropathic symptoms.  Hypertension Blood pressure well-controlled today without acute symptoms.  Follow-up Close monitoring of blood sugar levels and overall health status required. - Instruct to check blood sugar levels at supper time and in the morning before insulin . - Plan follow-up to review blood sugar readings and adjust treatment as necessary.  Subjective:    Outpatient Medications Prior to Visit  Medication Sig Dispense Refill   albuterol  (VENTOLIN  HFA) 108 (90 Base) MCG/ACT inhaler Inhale 2 puffs into the lungs every 6 (six) hours as needed for wheezing or shortness of breath. 6.7  g 5   blood glucose meter kit and supplies KIT Dispense based on patient and insurance preference. Use up to four times daily as  directed. (FOR ICD-9 250.00, 250.01). For QAC - HS accuchecks. 1 each 1   Blood Glucose Monitoring Suppl (BLOOD GLUCOSE MONITOR SYSTEM) w/Device KIT Use to test blood sugar in the morning, at noon, and at bedtime. 1 kit 0   carvedilol  (COREG ) 25 MG tablet Take 1 tablet (25 mg total) by mouth 2 (two) times daily with a meal. 60 tablet 1   cyanocobalamin  (VITAMIN B12) 1000 MCG tablet Take 1 tablet (1,000 mcg total) by mouth daily. 30 tablet 5   ferrous sulfate  325 (65 FE) MG EC tablet Take 1 tablet (325 mg total) by mouth daily with breakfast. 30 tablet 5   folic acid  (FOLVITE ) 1 MG tablet Take 1 tablet (1 mg total) by mouth daily. 30 tablet 11   furosemide  (LASIX ) 40 MG tablet Take 1 tablet (40 mg total) by mouth every morning. 30 tablet 5   insulin  glargine (LANTUS  SOLOSTAR) 100 UNIT/ML Solostar Pen Inject 36 Units into the skin daily.     Insulin  Pen Needle 32G X 4 MM MISC For 4 times a day insulin  Subcutaneously. 100 each 0   metFORMIN  (GLUCOPHAGE -XR) 500 MG 24 hr tablet Take 1 tablet (500 mg total) by mouth 2 (two) times daily. 30 tablet 5   mometasone -formoterol  (DULERA ) 200-5 MCG/ACT AERO Inhale 2 puffs into the lungs 2 (two) times daily. 13 g 0   sacubitril -valsartan  (ENTRESTO ) 49-51 MG Take 1 tablet by mouth 2 (two) times daily. 60 tablet 6   spironolactone  (ALDACTONE ) 25 MG tablet Take 0.5 tablets (12.5 mg total) by mouth daily. 45 tablet 3   No facility-administered medications prior to visit.   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   Body mass index (BMI) 30.0-30.9, adult 12/12/2023   CAP (community acquired  pneumonia) 03/22/2023   Diabetes mellitus, type II (HCC) 2015   Diagnosed during 2015 admission [facility]   Diabetic ketoacidosis without coma associated with type 2 diabetes mellitus (HCC) 09/13/2019   Diffuse 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   Hemangioma of spine 2015   S/P excision at [facility] with pathology favoring benign Hemangioma   Influenza A 06/17/2023   Multifocal pneumonia 07/29/2023   Numbness and tingling of both legs 03/22/2014   Obesity 12/12/2023   Paraplegia (HCC) 03/22/2014   Severe sepsis (HCC) 03/22/2023   Spinal hemangioma 03/23/2014   Thoracic myelopathy 03/22/2014   Past Surgical History:  Procedure Laterality Date   LAMINECTOMY N/A 03/22/2014   Procedure: THORACIC two - four  LAMINECTOMY FOR TUMOR;  Surgeon: Victory DELENA Gunnels, MD;  Location: [facility];  Service: Neurosurgery;  Laterality: N/A;  Throacic two  - four laminectomy for tumor   LEG SURGERY     Allergies  Allergen Reactions   Mustard Hives, Itching and Rash      Objective:    Physical Exam Vitals and nursing note reviewed.  Constitutional:      General: He is not in acute distress.    Appearance: Normal appearance.  HENT:     Head:  Normocephalic.  Cardiovascular:     Rate and Rhythm: Normal rate and regular rhythm.  Pulmonary:     Effort: Pulmonary effort is normal.     Breath sounds: Normal breath sounds.  Musculoskeletal:        General: Normal range of motion.     Cervical back: Normal range of motion.  Skin:    General: Skin is warm and dry.  Neurological:     Mental Status: He is alert and oriented to person, place, and time.  Psychiatric:        Mood and Affect: Mood normal.    BP 118/82 (BP Location: Left Arm, Patient Position: Sitting, Cuff Size: Large)   Pulse 98   Temp (!) 97.5 F (36.4 C) (Temporal)   Ht 5' 8 (1.727 m)   Wt 188 lb 6 oz (85.4 kg)   SpO2 98%   BMI 28.64 kg/m  Wt Readings from Last 3 Encounters:  12/12/23 188 lb 6 oz (85.4 kg)  12/09/23 195  lb (88.5 kg)  12/06/23 190 lb 0.6 oz (86.2 kg)       Lucius Krabbe, NP

## 2023-12-12 NOTE — Telephone Encounter (Signed)
 call Eston and let him know high sugar reading - did he take his Lantus ? What is his BS now? see if he can check and let us  know - if still higher than 500 he needs to go to the ER. Let me know.

## 2023-12-12 NOTE — Telephone Encounter (Signed)
 I called pt and Let him know of critical glucose reading. Also let pt know Corean Comment, NP highly recommends he check his blood sugar when he gets home. Advised pt if blood sugar is 500 or higher, Pt needs to go to the ER immediately due to this may cause a diabetic coma, Pt verbalized understanding to all recommendations.

## 2023-12-12 NOTE — Telephone Encounter (Signed)
 CRITICAL VALUE STICKER  CRITICAL VALUE: Glucose 540  RECEIVER (on-site recipient of call): Claria Salt, CMA  DATE & TIME NOTIFIED: 12/12/2023 @ 4:05  PM  MESSENGER (representative from lab): [email or web address]  MD NOTIFIED: Corean Comment, NP  TIME OF NOTIFICATION: 4:10 PM  RESPONSE: Noted

## 2023-12-13 ENCOUNTER — Other Ambulatory Visit: Payer: Self-pay

## 2023-12-13 ENCOUNTER — Emergency Department (HOSPITAL_COMMUNITY): Payer: Self-pay

## 2023-12-13 ENCOUNTER — Encounter (HOSPITAL_COMMUNITY): Payer: Self-pay | Admitting: *Deleted

## 2023-12-13 ENCOUNTER — Emergency Department (HOSPITAL_COMMUNITY)
Admission: EM | Admit: 2023-12-13 | Discharge: 2023-12-13 | Disposition: A | Discharge status: Home / Self Care | Payer: Self-pay | Attending: Emergency Medicine | Admitting: Emergency Medicine

## 2023-12-13 DIAGNOSIS — Z794 Long term (current) use of insulin: Secondary | ICD-10-CM | POA: Insufficient documentation

## 2023-12-13 DIAGNOSIS — E1169 Type 2 diabetes mellitus with other specified complication: Secondary | ICD-10-CM

## 2023-12-13 DIAGNOSIS — Z713 Dietary counseling and surveillance: Secondary | ICD-10-CM | POA: Insufficient documentation

## 2023-12-13 DIAGNOSIS — Z7984 Long term (current) use of oral hypoglycemic drugs: Secondary | ICD-10-CM | POA: Insufficient documentation

## 2023-12-13 DIAGNOSIS — I509 Heart failure, unspecified: Secondary | ICD-10-CM | POA: Insufficient documentation

## 2023-12-13 DIAGNOSIS — E1165 Type 2 diabetes mellitus with hyperglycemia: Secondary | ICD-10-CM | POA: Insufficient documentation

## 2023-12-13 DIAGNOSIS — Z7189 Other specified counseling: Secondary | ICD-10-CM

## 2023-12-13 LAB — COMPREHENSIVE METABOLIC PANEL WITH GFR
ALT: 16 U/L (ref 0–44)
AST: 17 U/L (ref 15–41)
Albumin: 3.9 g/dL (ref 3.5–5.0)
Alkaline Phosphatase: 66 U/L (ref 38–126)
Anion gap: 12 (ref 5–15)
BUN: 28 mg/dL — ABNORMAL HIGH (ref 6–20)
CO2: 23 mmol/L (ref 22–32)
Calcium: 9.4 mg/dL (ref 8.9–10.3)
Chloride: 92 mmol/L — ABNORMAL LOW (ref 98–111)
Creatinine, Ser: 1.8 mg/dL — ABNORMAL HIGH (ref 0.61–1.24)
GFR, Estimated: 46 mL/min — ABNORMAL LOW (ref >60)
Glucose, Bld: 591 mg/dL (ref 70–99)
Potassium: 5 mmol/L (ref 3.5–5.1)
Sodium: 127 mmol/L — ABNORMAL LOW (ref 135–145)
Total Bilirubin: 1 mg/dL (ref 0.0–1.2)
Total Protein: 7.3 g/dL (ref 6.5–8.1)

## 2023-12-13 LAB — BETA-HYDROXYBUTYRIC ACID
Beta-Hydroxybutyric Acid: 0.14 mmol/L (ref 0.05–0.27)
Beta-Hydroxybutyric Acid: 0.22 mmol/L (ref 0.05–0.27)

## 2023-12-13 LAB — I-STAT VENOUS BLOOD GAS, ED
Acid-Base Excess: 0 mmol/L (ref 0.0–2.0)
Bicarbonate: 26.9 mmol/L (ref 20.0–28.0)
Calcium, Ion: 1.16 mmol/L (ref 1.15–1.40)
HCT: 45 % (ref 39.0–52.0)
Hemoglobin: 15.3 g/dL (ref 13.0–17.0)
O2 Saturation: 63 %
Potassium: 4.8 mmol/L (ref 3.5–5.1)
Sodium: 128 mmol/L — ABNORMAL LOW (ref 135–145)
TCO2: 28 mmol/L (ref 22–32)
pCO2, Ven: 50.8 mmHg (ref 44–60)
pH, Ven: 7.333 (ref 7.25–7.43)
pO2, Ven: 35 mmHg (ref 32–45)

## 2023-12-13 LAB — CBC WITH DIFFERENTIAL/PLATELET
Abs Immature Granulocytes: 0.03 K/uL (ref 0.00–0.07)
Basophils Absolute: 0 K/uL (ref 0.0–0.1)
Basophils Relative: 0 %
Eosinophils Absolute: 0.1 K/uL (ref 0.0–0.5)
Eosinophils Relative: 1 %
HCT: 39.6 % (ref 39.0–52.0)
Hemoglobin: 13.1 g/dL (ref 13.0–17.0)
Immature Granulocytes: 0 %
Lymphocytes Relative: 15 %
Lymphs Abs: 1.1 K/uL (ref 0.7–4.0)
MCH: 23.5 pg — ABNORMAL LOW (ref 26.0–34.0)
MCHC: 33.1 g/dL (ref 30.0–36.0)
MCV: 71.1 fL — ABNORMAL LOW (ref 80.0–100.0)
Monocytes Absolute: 0.5 K/uL (ref 0.1–1.0)
Monocytes Relative: 6 %
Neutro Abs: 5.9 K/uL (ref 1.7–7.7)
Neutrophils Relative %: 78 %
Platelets: 182 K/uL (ref 150–400)
RBC: 5.57 MIL/uL (ref 4.22–5.81)
RDW: 15.9 % — ABNORMAL HIGH (ref 11.5–15.5)
WBC: 7.6 K/uL (ref 4.0–10.5)
nRBC: 0 % (ref 0.0–0.2)

## 2023-12-13 LAB — URINALYSIS, ROUTINE W REFLEX MICROSCOPIC
Bilirubin Urine: NEGATIVE
Glucose, UA: >=500 mg/dL — AB
Hgb urine dipstick: NEGATIVE
Ketones, ur: NEGATIVE mg/dL
Leukocytes,Ua: NEGATIVE
Nitrite: NEGATIVE
Protein, ur: NEGATIVE mg/dL
Specific Gravity, Urine: 1.015 (ref 1.005–1.030)
pH: 6 (ref 5.0–8.0)

## 2023-12-13 LAB — C-PEPTIDE: C-Peptide: 6.65 ng/mL — ABNORMAL HIGH (ref 0.80–3.85)

## 2023-12-13 LAB — CBG MONITORING, ED
Glucose-Capillary: 566 mg/dL (ref 70–99)
Glucose-Capillary: 519 mg/dL (ref 70–99)

## 2023-12-13 MED ORDER — BASAGLAR TEMPO PEN 100 UNIT/ML ~~LOC~~ SOPN: 20.0000 [IU] | PEN_INJECTOR | Freq: Two times a day (BID) | SUBCUTANEOUS | 1 refills | Status: DC

## 2023-12-13 MED ORDER — LACTATED RINGERS IV BOLUS
1000.0000 mL | Freq: Once | INTRAVENOUS | Status: AC
  Administered 2023-12-13: 1000 mL via INTRAVENOUS

## 2023-12-13 MED ORDER — INSULIN ASPART 100 UNIT/ML IJ SOLN
10.0000 [IU] | Freq: Once | INTRAMUSCULAR | Status: AC
  Administered 2023-12-13: 10 [IU] via SUBCUTANEOUS

## 2023-12-13 NOTE — ED Provider Notes (Signed)
 [place] EMERGENCY DEPARTMENT AT [facility] Provider Note   CSN: [id number] Arrival date & time: 12/13/23  1228     Patient presents with: No chief complaint on file.   Gary Conway is a 48 y.o. male with h/o CHF, DM presents the emerged from today for evaluation of elevated blood sugar.  Patient has been seen multiple times this past week for elevated blood sugars.  Was seen at his primary care provider yesterday and had his insulin  changed.  He was told to come in due to the elevation of his blood sugar again.  Patient reports he has been using 3 of his insulin  but is unable to tell me of what measuring unit, possibly insulin  units?  I see on his previous chart he was on 36 units that was changed to 20 units twice daily.  He currently has no complaints and was just told to come in because of his elevated blood sugar.  No chest pain, shortness of breath, recent illnesses.  He reports he has been using his insulin  for the past few days and has been compliant with his other medications.  HPI     Prior to Admission medications   Medication Sig Start Date End Date Taking? Authorizing Provider  albuterol  (VENTOLIN  HFA) 108 (90 Base) MCG/ACT inhaler Inhale 2 puffs into the lungs every 6 (six) hours as needed for wheezing or shortness of breath. 10/11/23   Lucius Krabbe, NP  blood glucose meter kit and supplies KIT Dispense based on patient and insurance preference. Use up to four times daily as directed. (FOR ICD-9 250.00, 250.01). For QAC - HS accuchecks. 09/14/19   Singh, Prashant K, MD  Blood Glucose Monitoring Suppl (BLOOD GLUCOSE MONITOR SYSTEM) w/Device KIT Use to test blood sugar in the morning, at noon, and at bedtime. 04/08/23   Singh, Prashant K, MD  carvedilol  (COREG ) 25 MG tablet Take 1 tablet (25 mg total) by mouth 2 (two) times daily with a meal. 12/04/23   Lucius Krabbe, NP  cyanocobalamin  (VITAMIN B12) 1000 MCG tablet Take 1 tablet (1,000 mcg total) by mouth  daily. 10/11/23   Lucius Krabbe, NP  ferrous sulfate  325 (65 FE) MG EC tablet Take 1 tablet (325 mg total) by mouth daily with breakfast. 10/11/23   Lucius Krabbe, NP  folic acid  (FOLVITE ) 1 MG tablet Take 1 tablet (1 mg total) by mouth daily. 10/11/23   Lucius Krabbe, NP  furosemide  (LASIX ) 40 MG tablet Take 1 tablet (40 mg total) by mouth every morning. 10/11/23   Lucius Krabbe, NP  insulin  glargine (LANTUS  SOLOSTAR) 100 UNIT/ML Solostar Pen Inject 20 Units into the skin 2 (two) times daily. Take after breakfast and supper. 12/12/23   Lucius Krabbe, NP  Insulin  Pen Needle 32G X 4 MM MISC For 4 times a day insulin  Subcutaneously. 04/08/23   Singh, Prashant K, MD  metFORMIN  (GLUCOPHAGE -XR) 500 MG 24 hr tablet Take 1 tablet (500 mg total) by mouth 2 (two) times daily. 11/08/23   Lucius Krabbe, NP  mometasone -formoterol  (DULERA ) 200-5 MCG/ACT AERO Inhale 2 puffs into the lungs 2 (two) times daily. 08/02/23   Elgergawy, Brayton RAMAN, MD  sacubitril -valsartan  (ENTRESTO ) 49-51 MG Take 1 tablet by mouth 2 (two) times daily. 10/11/23   Lucius Krabbe, NP  spironolactone  (ALDACTONE ) 25 MG tablet Take 0.5 tablets (12.5 mg total) by mouth daily. 10/11/23   Lucius Krabbe, NP    Allergies: Mustard    Review of Systems  Constitutional:  Negative for chills and  fever.  Respiratory:  Negative for shortness of breath.   Cardiovascular:  Negative for chest pain.  Gastrointestinal:  Negative for abdominal pain, nausea and vomiting.  Genitourinary:  Negative for dysuria.    Updated Vital Signs BP 109/82   Pulse 80   Temp 98.2 F (36.8 C)   Resp 18   Ht 5' 8 (1.727 m)   Wt 85.3 kg   SpO2 98%   BMI 28.59 kg/m   Physical Exam Vitals and nursing note reviewed.  Constitutional:      General: He is not in acute distress.    Appearance: He is not ill-appearing or toxic-appearing.  HENT:     Mouth/Throat:     Mouth: Mucous membranes are moist.  Eyes:     General: No scleral  icterus. Cardiovascular:     Rate and Rhythm: Normal rate.  Pulmonary:     Effort: Pulmonary effort is normal. No respiratory distress.  Skin:    General: Skin is warm and dry.  Neurological:     Mental Status: He is alert.     (all labs ordered are listed, but only abnormal results are displayed) Labs Reviewed  CBC WITH DIFFERENTIAL/PLATELET - Abnormal; Notable for the following components:      Result Value   MCV 71.1 (*)    MCH 23.5 (*)    RDW 15.9 (*)    All other components within normal limits  COMPREHENSIVE METABOLIC PANEL WITH GFR - Abnormal; Notable for the following components:   Sodium 127 (*)    Chloride 92 (*)    Glucose, Bld 591 (*)    BUN 28 (*)    Creatinine, Ser 1.80 (*)    GFR, Estimated 46 (*)    All other components within normal limits  I-STAT VENOUS BLOOD GAS, ED - Abnormal; Notable for the following components:   Sodium 128 (*)    All other components within normal limits  BETA-HYDROXYBUTYRIC ACID  BETA-HYDROXYBUTYRIC ACID  URINALYSIS, ROUTINE W REFLEX MICROSCOPIC  CBG MONITORING, ED    EKG: None  Radiology: DG Chest 2 View Result Date: 12/13/2023 CLINICAL DATA:  Cough for 1 week. EXAM: CHEST - 2 VIEW COMPARISON:  December 06, 2023. FINDINGS: The heart size and mediastinal contours are within normal limits. Both lungs are clear. Stable elevated right hemidiaphragm. The visualized skeletal structures are unremarkable. IMPRESSION: No active cardiopulmonary disease. Electronically Signed   By: Lynwood Landy Raddle M.D.   On: 12/13/2023 14:30   Procedures   Medications Ordered in the ED - No data to display  Clinical Course as of 12/13/23 1649  Fri Dec 13, 2023  1548 Spoke with Marjorie, from Diabetes Inpatient Coordinator Educator who will come down to educate the patient.  [RR]    Clinical Course User Index [RR] Bernis Ernst, PA-C                               Medical Decision Making Risk Prescription drug management.   48 y.o. male presents to  the ER for evaluation of elevated blood sugar. Differential diagnosis includes but is not limited to hyperglycemia, DKA, HHS, infectious process. Vital signs unremarkable. Physical exam as noted above.   On previous chart evaluation, patient's been seen multiple times in the ER this past week for hyperglycemia however does not meet criteria for DKA.  Will obtain labs today.  He presents today for hyperglycemia but is asymptomatic.  Reports compliancy to his  insulin  but reports he has been taking 3, I am assuming units, but he was unable to further clarify this.  Given that diabetes and patient educators here today, I did consult and spoke with Marjorie who will come down with a demo pen for the patient to assess his actual use of his insulin .  Will obtain DKA labs in the meantime.  I independently reviewed and interpreted the patient's labs.  Urinalysis shows greater than 5 and glucose with rare bacteria but no ketones are present.  CBC without leukocytosis or anemia.  CMP shows glucose of 591, pseudohyponatremia the sodium of 127, creatinine 1.80 around patient's baseline with a BUN of 28.  No other electrolyte or LFT abnormality.  VBG shows a pH of 7.33 with a beta-hydroxybutyrate acid within normal limits.    Chest x-ray shows no acute cardiopulmonary process.  Marjorie from diabetes education came to assess the patient.  He is only been giving himself 3 units of insulin  when he is prescribed 36 units of insulin  daily.  I think this is likely the cause of his elevated blood sugar.  After speaking with Marjorie, RN, he cannot afford his current insulin  regimen and has given him a coupon for Basaglar  with [employer].  She reports this is a 1-1 transition and it cost him $35 a month which he reports he can afford.  I will send him in a new prescription for this.  I have sent his PCP a message alerting her to the change.  Patient does not meet criteria for HHS.  This is likely hyperglycemia.  His  glucose has not improved some however nondeficient is expected that given that he has eaten here because of his extended stay.  He was given insulin  here as well as fluids.  Will discharge him home to continue using his insulin .  We discussed the importance of stopping the use of Lantus  and continuing to use the Basaglar .  Discussed monitoring his blood sugar at home as well as going with PCP.  He does not many criteria for DKA or HHS.  Do not think he meets any admission criteria.  Hopefully with the education provided in the emergency department as well as any medications, his blood sugars will be in better control.  We discussed the results of the labs/imaging. The plan is using the insulin , monitor blood sugars closely, follow-up PCP. We discussed strict return precautions and red flag symptoms. The patient verbalized their understanding and agrees to the plan. The patient is stable and being discharged home in good condition.  I discussed this case with my attending physician who cosigned this note including patient's presenting symptoms, physical exam, and planned diagnostics and interventions. Attending physician stated agreement with plan or made changes to plan which were implemented.   Attending physician assessed patient at bedside.  Portions of this report may have been transcribed using voice recognition software. Every effort was made to ensure accuracy; however, inadvertent computerized transcription errors may be present.    Final diagnoses:  Hyperglycemia due to diabetes mellitus [facility])  Encounter for diabetes education    ED Discharge Orders          Ordered    Insulin  Glargine w/ Trans Port (BASAGLAR  TEMPO PEN) 100 UNIT/ML SOPN  2 times daily        12/13/23 2004               Bernis Ernst, PA-C 12/13/23 2356    Emil Share, DO 12/16/23 [phone]

## 2023-12-13 NOTE — Discharge Instructions (Addendum)
 You were seen in the emerged from today for evaluation of your high blood sugar.  I think this is likely from not using the Lantus  pen accurately.  I am glad that our diabetes educator was able to come and talk with you today.  I am changing your insulin .  Please STOP using the Lantus . Instead, you will be using the Basaglar  and give yourself 20 units in the morning and 20 units at night. Bring the coupon that was given to you for medication cost assistance. Please make sure to follow up with your PCP and monitor your blood sugars closely. Please make sure that you are keeping a log for your next appointments. I have included additional information for you to review. If you have any concerns, new or worsening symptoms, please return to the ER for re-evaluation.   Contact a health care provider if: You have diabetes and have trouble keeping your blood sugar in the right range. Your blood sugar is at or above 240 mg/dL (86.6 mmol/L) for 2 days in a row. You have high blood sugar often. You have signs of illness, such as: Nausea or vomiting. A headache. A fever. You can't stop vomiting. Get help right away if: Your blood sugar monitor reads high even when you're taking insulin . You have trouble breathing. These symptoms may be an emergency. Call 911 right away. Do not wait to see if the symptoms will go away. Do not drive yourself to the hospital.

## 2023-12-13 NOTE — ED Triage Notes (Signed)
 States he went to his doctor yest for check up and his blood sugar was high, states she told him to come to the ED if it went up again.

## 2023-12-13 NOTE — Inpatient Diabetes Management (Signed)
 Inpatient Diabetes Program Recommendations  AACE/ADA: New Consensus Statement on Inpatient Glycemic Control (2015)  Target Ranges:  Prepandial:   less than 140 mg/dL      Peak postprandial:   less than 180 mg/dL (1-2 hours)      Critically ill patients:  140 - 180 mg/dL   Lab Results  Component Value Date   GLUCAP 371 (H) 12/09/2023   HGBA1C 8.9 (A) 11/08/2023    Review of Glycemic Control  Diabetes history: DM2 Outpatient Diabetes medications: Lantus  36 units daily (only taking 3 units daily) Current orders for Inpatient glycemic control:   Inpatient Diabetes Program Recommendations:   Spoke with patient about his diabetes. States that he was diagnosed a few years ago, but just started taking insulin  last year. Had the patient show coordinator how he was dialing the dosage on an insulin  pen; patient dialed to 3 units. Has been prescribed Lantus  36 units daily.  Patient was able to return demonstration of dialing 36 units on insulin  pen successfully.  Discussed taking the insulin  once per day, same time each day, and rotating sites for injections. States that he has not been able to purchase the insulin . Was given a [employer] coupon for Basaglar  for $35/month to take to the pharmacy. Will be given a prescription for Basalgar 36 units daily per discharge orders. Patient does have a home blood glucose meter and strips. Suggested that he check his blood sugars at least x2 per day and record to take with him to the PCP appointment.  Marjorie Lunger RN BSN CDE Diabetes Coordinator Pager: [phone]  8am-5pm

## 2023-12-13 NOTE — ED Provider Triage Note (Signed)
 Emergency Medicine Provider Triage Evaluation Note  Gary Conway , a 48 y.o. male  was evaluated in triage.  Pt complains of hyperglycemia.  Review of Systems  Positive:  Negative:   Physical Exam  BP 123/82 (BP Location: Left Arm)   Pulse 100   Temp 98.2 F (36.8 C)   Resp 17   Ht 5' 8 (1.727 m)   Wt 85.3 kg   SpO2 99%   BMI 28.59 kg/m  Gen:   Awake, no distress   Resp:  Normal effort  MSK:   Moves extremities without difficulty  Other:    Medical Decision Making  Medically screening exam initiated at 1:21 PM.  Appropriate orders placed.  Gary Conway was informed that the remainder of the evaluation will be completed by another provider, this initial triage assessment does not replace that evaluation, and the importance of remaining in the ED until their evaluation is complete.  Hyperglycemia of 580 this morning. Patient on patient taken 8 units of insulin  daily and has had DM for 3 years. PCP saw patient yesterday but did not change DM management. PCP told patient to come to ED if hyperglycemia does not resolve.   Patient endorses increased urination and thirst. Endorses chronic chest pain from CHF which he states has gotten better recently. Endorses intermittent coughing starting around 3 days ago. Denies recent fever, SOB, nausea, vomiting, diarrhea.   Gary Conway F, [place] 12/13/23 1326

## 2023-12-16 ENCOUNTER — Other Ambulatory Visit (HOSPITAL_COMMUNITY): Payer: Self-pay

## 2023-12-16 ENCOUNTER — Other Ambulatory Visit: Payer: Self-pay | Admitting: Family

## 2023-12-16 ENCOUNTER — Telehealth: Payer: Self-pay | Admitting: *Deleted

## 2023-12-16 ENCOUNTER — Telehealth: Payer: Self-pay

## 2023-12-16 ENCOUNTER — Ambulatory Visit: Payer: Self-pay | Admitting: Family

## 2023-12-16 DIAGNOSIS — E781 Pure hyperglyceridemia: Secondary | ICD-10-CM

## 2023-12-16 MED ORDER — FENOFIBRATE 145 MG PO TABS
145.0000 mg | ORAL_TABLET | Freq: Every day | ORAL | 5 refills | Status: DC
  Filled 2023-12-16 – 2024-01-09 (×3): qty 30, 30d supply, fill #0
  Filled 2024-04-13: qty 30, 30d supply, fill #1

## 2023-12-16 MED ORDER — FENOFIBRATE 145 MG PO TABS
145.0000 mg | ORAL_TABLET | Freq: Every day | ORAL | 5 refills | Status: DC
  Filled 2023-12-16: qty 30, 30d supply, fill #0

## 2023-12-16 NOTE — Telephone Encounter (Signed)
-----   Message from Lucius Krabbe sent at 12/16/2023 11:30 AM EDT ----- Regarding: pharmacy I sent Jomarie a lab message today, ordered new Fenofibrate  and the pharmacy sent back a message stating he has to get his meds from the [facility] outpatient pharmacy [facility] - this is only for patients picking up meds after being a patient. Please provide the address for the outpatient pharmacy. Also I received a note from the ER - he went in again due to high BS - they found out he has only been giving himself 3 units of insulin , not 30!  So now I am worried he will get too much insulin  since I increased his dose. VERY important he check his CBGs and let me know what they are.  Also remind him to check on his Medicaid status. Thx ----- Message ----- From: Lucius Krabbe, NP Sent: 12/16/2023  11:35 AM EDT To: Team Hudnell Subject: pharmacy                                       I sent Erikson a lab message today, ordered new Fenofibrate  and the pharmacy sent back a message stating he has to get his meds from the [facility] not [facility] - this is only for patients picking up meds after being a patient. Please provide the address for the outpatient pharmacy. Also remind him to check on his Medicaid status and let us  know what his CBG readings are! Thx

## 2023-12-16 NOTE — Progress Notes (Unsigned)
 Complex Care Management Care Guide Note  12/16/2023 Name: Gary Conway MRN: [id number] DOB: 05-10-1976  Gary Conway is a 48 y.o. year old male who is a primary care patient of Hudnell, Corean, NP and is actively engaged with the care management team. I reached out to Blase Shirah by phone today to assist with re-scheduling  with the Pharmacist.  Follow up plan: Unsuccessful telephone outreach attempt made. A HIPAA compliant phone message was left for the patient providing contact information and requesting a return call.  Thedford Franks, CMA [place]  [facility], [facility] Guide Direct Dial : [phone]  Fax: [phone] Website: [email or web address]

## 2023-12-16 NOTE — Telephone Encounter (Signed)
 I reached out to pt and lvm in regards.

## 2023-12-17 NOTE — Progress Notes (Signed)
 Complex Care Management Care Guide Note  12/17/2023 Name: Gary Conway MRN: [id number] DOB: 1976-03-03  Gary Conway is a 48 y.o. year old male who is a primary care patient of Hudnell, Corean, NP and is actively engaged with the care management team. I reached out to Gary Conway by phone today to assist with re-scheduling  with the Pharmacist.  Follow up plan: Unsuccessful telephone outreach attempt made. A HIPAA compliant phone message was left for the patient providing contact information and requesting a return call. No further outreach attempts will be made due to inability to maintain patient contact.   Gary Conway, CMA [place]  [facility], [facility] Guide Direct Dial : [phone]  Fax: [phone] Website: [email or web address]

## 2023-12-20 ENCOUNTER — Other Ambulatory Visit (HOSPITAL_COMMUNITY): Payer: Self-pay

## 2023-12-20 ENCOUNTER — Other Ambulatory Visit: Payer: Self-pay | Admitting: Pharmacist

## 2023-12-20 NOTE — Progress Notes (Signed)
 12/20/2023 Name: Gary Conway MRN: [id number] DOB: 03/07/1976  Chief Complaint  Patient presents with   Diabetes   Medication Management    Gary Conway is a 48 y.o. year old male who presented for a telephone visit.   They were referred to the pharmacist by a quality report for assistance in managing diabetes and medication access.    Subjective:  Care Team: Primary Care Provider: Lucius Krabbe, NP ; Next Scheduled Visit: 12/2023  Medication Access/Adherence  Current Pharmacy:  [facility] [address] [place] [address] Phone: [phone] Fax: [phone]  [facility] [address] [place] [address] Phone: [phone] Fax: [phone]   Patient reports affordability concerns with their medications: Yes  - he currently has not insurance.  Patient reports access/transportation concerns to their pharmacy: No  Patient reports adherence concerns with their medications:  No      Diabetes:  Current medications: Basaglar  - med list has 20 units twice a day but patient states he was told at the ER to increase to 36 units twice a day.  Metformin  500mg  twice a day.    Current glucose readings: per patient 160 to 170's since last insulin  increase with occasional reading in the 200's  Patient denies hypoglycemic s/sx including no dizziness, shakiness, sweating. Patient denies hyperglycemic symptoms including polyuria, polydipsia, polyphagia.    Current medication access support: none   Objective:  Lab Results  Component Value Date   HGBA1C 8.9 (A) 11/08/2023    Lab Results  Component Value Date   CREATININE 1.80 (H) 12/13/2023   BUN 28 (H) 12/13/2023   NA 128 (L) 12/13/2023   K 4.8 12/13/2023   CL 92 (L) 12/13/2023   CO2 23 12/13/2023    Lab Results  Component Value Date   CHOL 208 (H) 12/12/2023   HDL 32.20 (L) 12/12/2023   LDLDIRECT 42.0 12/12/2023    TRIG (H) 12/12/2023    542.0 Triglyceride is over 400; calculations on Lipids are invalid.   CHOLHDL 6 12/12/2023    Medications Reviewed Today     Reviewed by Carla Milling, RPH-CPP (Pharmacist) on 12/20/23 at 1614  Med List Status: <None>   Medication Order Taking? Sig Documenting Provider Last Dose Status Informant  albuterol  (VENTOLIN  HFA) 108 (90 Base) MCG/ACT inhaler [id number]  Inhale 2 puffs into the lungs every 6 (six) hours as needed for wheezing or shortness of breath. Lucius Krabbe, NP  Active   blood glucose meter kit and supplies KIT [id number]  Dispense based on patient and insurance preference. Use up to four times daily as directed. (FOR ICD-9 250.00, 250.01). For QAC - HS accuchecks. Singh, Prashant K, MD  Active Self, Pharmacy Records  Blood Glucose Monitoring Suppl (BLOOD GLUCOSE MONITOR SYSTEM) w/Device KIT [id number]  Use to test blood sugar in the morning, at noon, and at bedtime. Singh, Prashant K, MD  Active Self, Pharmacy Records  carvedilol  (COREG ) 25 MG tablet [id number]  Take 1 tablet (25 mg total) by mouth 2 (two) times daily with a meal. Lucius Krabbe, NP  Active   cyanocobalamin  (VITAMIN B12) 1000 MCG tablet [id number]  Take 1 tablet (1,000 mcg total) by mouth daily. Lucius Krabbe, NP  Active   fenofibrate  (TRICOR ) 145 MG tablet [id number]  Take 1 tablet (145 mg total) by mouth daily. Lucius Krabbe, NP  Active   ferrous sulfate  325 (65 FE) MG EC tablet [id number]  Take 1 tablet (325 mg  total) by mouth daily with breakfast. Lucius Krabbe, NP  Active   folic acid  (FOLVITE ) 1 MG tablet [id number]  Take 1 tablet (1 mg total) by mouth daily. Lucius Krabbe, NP  Active   furosemide  (LASIX ) 40 MG tablet [id number]  Take 1 tablet (40 mg total) by mouth every morning. Lucius Krabbe, NP  Active   Insulin  Glargine w/ Trans Port (BASAGLAR  TEMPO PEN) 100 UNIT/ML SOPN [id number] Yes Inject 20 Units into the skin 2 (two) times daily. Take after breakfast  and dinner. Bernis Ernst, PA-C  Active   Insulin  Pen Needle 32G X 4 MM MISC [id number]  For 4 times a day insulin  Subcutaneously. Singh, Prashant K, MD  Active Self, Pharmacy Records  metFORMIN  (GLUCOPHAGE -XR) 500 MG 24 hr tablet [id number] Yes Take 1 tablet (500 mg total) by mouth 2 (two) times daily. Lucius Krabbe, NP  Active   mometasone -formoterol  (DULERA ) 200-5 MCG/ACT AERO [id number]  Inhale 2 puffs into the lungs 2 (two) times daily. Elgergawy, Brayton RAMAN, MD  Active   sacubitril -valsartan  (ENTRESTO ) 49-51 MG [id number]  Take 1 tablet by mouth 2 (two) times daily. Lucius Krabbe, NP  Active   spironolactone  (ALDACTONE ) 25 MG tablet [id number]  Take 0.5 tablets (12.5 mg total) by mouth daily. Lucius Krabbe, NP  Active               Assessment/Plan:   Diabetes:Currently uncontrolled - Reviewed long term cardiovascular and renal outcomes of uncontrolled blood sugar - Reviewed goal A1c, goal fasting, and goal 2 hour post prandial glucose - Recommend to continue current regimen for diabetes  - Recommend to check glucose 1 to 2 times a day. Would like to try Continuous Glucose Monitor but since patient does not have insurance cost if prohibitive.  - Meets financial criteria for Basaglar  patient assistance program through [place]. Will collaborate with provider, CPhT, and patient to pursue assistance.   He also should qualify for patient assistance for Entresto . I could not find an assistance program for Dulera  but we can change to Breo and try to get thru [employer] medication assistance program.     Follow Up Plan: 1 month  Madelin Ray, PharmD Clinical Pharmacist [facility]  Population Health [phone]

## 2023-12-23 ENCOUNTER — Telehealth: Payer: Self-pay

## 2023-12-23 NOTE — Telephone Encounter (Signed)
 PAP: Patient assistance application for Basaglar through [employer] has been mailed to pt's home address on file. Provider portion of application will be faxed to provider's office.

## 2023-12-23 NOTE — Telephone Encounter (Signed)
 PAP: Patient assistance application for Entresto through [employer] has been mailed to pt's home address on file. Provider portion of application will be faxed to provider's office.

## 2023-12-25 ENCOUNTER — Other Ambulatory Visit (HOSPITAL_COMMUNITY): Payer: Self-pay

## 2023-12-25 NOTE — Progress Notes (Signed)
 12/25/2023 - Addendum In regards to applying for Breo / Dulera  patient assistance program. Provider noted in May 2025   Will hold off on Dulera  for now, doing ok, lungs clear on exam. Administer pneumonia vaccine when appropriate.Refill Albuterol  inhaler, continue prn use.  Will hold off on Breo patient assistance program for now. If PCP would like patient to restart maintenance inhaler, can start application process.   Madelin Ray, PharmD Clinical Pharmacist [facility]  Population Health [phone]

## 2023-12-26 ENCOUNTER — Other Ambulatory Visit (HOSPITAL_COMMUNITY): Payer: Self-pay

## 2023-12-26 ENCOUNTER — Other Ambulatory Visit: Payer: Self-pay

## 2023-12-26 NOTE — Telephone Encounter (Signed)
 Received provider portion of PAP application for Entresto  [employer]) will follow up with patient.

## 2023-12-26 NOTE — Telephone Encounter (Signed)
 Received Provider page for PAP Application for Basaglar  ([employer]) will follow up with patient soon.

## 2024-01-06 ENCOUNTER — Other Ambulatory Visit (HOSPITAL_COMMUNITY): Payer: Self-pay

## 2024-01-07 NOTE — Telephone Encounter (Signed)
 Reached out to patient via telephone in contacts- cannot connect at this time. Will try again soon.

## 2024-01-09 ENCOUNTER — Other Ambulatory Visit (HOSPITAL_COMMUNITY): Payer: Self-pay

## 2024-01-13 ENCOUNTER — Encounter: Payer: Self-pay | Admitting: Family

## 2024-01-14 ENCOUNTER — Encounter: Payer: Self-pay | Admitting: Family

## 2024-01-14 ENCOUNTER — Other Ambulatory Visit (HOSPITAL_COMMUNITY): Payer: Self-pay

## 2024-01-14 ENCOUNTER — Ambulatory Visit (INDEPENDENT_AMBULATORY_CARE_PROVIDER_SITE_OTHER): Payer: Self-pay | Admitting: Family

## 2024-01-14 VITALS — BP 133/94 | HR 102 | Temp 98.1°F | Ht 68.0 in | Wt 191.2 lb

## 2024-01-14 DIAGNOSIS — E1169 Type 2 diabetes mellitus with other specified complication: Secondary | ICD-10-CM

## 2024-01-14 DIAGNOSIS — E1159 Type 2 diabetes mellitus with other circulatory complications: Secondary | ICD-10-CM

## 2024-01-14 DIAGNOSIS — L600 Ingrowing nail: Secondary | ICD-10-CM

## 2024-01-14 DIAGNOSIS — E669 Obesity, unspecified: Secondary | ICD-10-CM

## 2024-01-14 DIAGNOSIS — I152 Hypertension secondary to endocrine disorders: Secondary | ICD-10-CM

## 2024-01-14 MED ORDER — BASAGLAR KWIKPEN 100 UNIT/ML ~~LOC~~ SOPN
26.0000 [IU] | PEN_INJECTOR | Freq: Two times a day (BID) | SUBCUTANEOUS | 5 refills | Status: DC
  Filled 2024-01-14 – 2024-01-21 (×2): qty 15, 29d supply, fill #0
  Filled 2024-04-24: qty 15, 29d supply, fill #1

## 2024-01-14 NOTE — Assessment & Plan Note (Signed)
 Hypertension with recent high blood pressure. Inconsistent medication adherence with Entresto  and carvedilol . - Instruct to take blood pressure medications as prescribed. - Advised to take medications before appointments for accurate readings. - Encourage follow-up with cardiology in September. - F/U in 1 month

## 2024-01-14 NOTE — Assessment & Plan Note (Signed)
 Type 2 diabetes with elevated fasting glucose at 180 mg/dL. Difficulty affording Basaglar  insulin . Current regimen with Novolog  is suboptimal. - Send correct prescription for Basaglar  kwikpen to pharmacy. - Call pharmacy and find out correct name of fast acting insulin  and dose - Instruct to monitor blood glucose levels, especially fasting, and report if higher than 130 or preprandial higher than 150. - Coordinate with pharmacist Tammy to address any cost issues and explore sample availability. - Advised to maintain hydration to prevent cramping. - F/U in 1 month

## 2024-01-14 NOTE — Progress Notes (Signed)
 Patient ID: Gary Conway, male    DOB: [birth year], 48 y.o.   MRN: [id number]  Chief Complaint  Patient presents with   Diabetes   Nail Problem    Pt c/o nail problem on bilateral big toes.    Hypertension  Discussed the use of AI scribe software for clinical note transcription with the patient, who gave verbal consent to proceed.  History of Present Illness Gary Conway is a 48 year old male with diabetes who presents for medication management, toenail problem, Diabetes and ER follow-up.  Diabetes mellitus management - Seen in ED on 7/18 for - Unable to afford Basaglar  insulin , with a quoted cost of $900 - Currently using Novolog  insulin  a new RX obtained from his ED visit & picked up from pharmacy - Administers Novolog  three times daily: 2 units in the morning, 6 units at lunch, and 10 units at dinner per dose instructions - Fasting blood glucose was 180 mg/dL yesterday morning  Muscle cramping - Experiences cramping - Increased hydration to alleviate symptoms  Peripheral neuropathy and nail discomfort - Burning sensation and feeling of hangnails in both great toenails - Discomfort affects ability to wear steel-toed shoes  Antihypertensive medication adherence - Takes Entresto  & Coreg  for blood pressure twice daily - Did not take blood pressure medication today, but did take it yesterday, high BP in office  Insurance and medication access barriers - Awaiting Medicaid approval and working with a caseworker to [employer] - Informed that income is too high for Medicaid but exploring other options  Assessment & Plan Type 2 diabetes mellitus Uncontrolled -  elevated fasting glucose at 180 mg/dL. Difficulty affording Basaglar  insulin . Current regimen with Novolog  is suboptimal. - Send correct prescription for Basaglar  kwikpen to pharmacy. - Call pharmacy and find out correct name of fast acting insulin  and dose - Instruct to monitor blood glucose levels, especially  fasting, and report if higher than 130 or preprandial higher than 150. - Coordinate with pharmacist Tammy to address any cost issues and explore sample availability. - Advised to maintain hydration to prevent cramping. - F/U in 1 month  Hypertension Hypertension with recent high blood pressure. Inconsistent medication adherence with Entresto  and carvedilol . - Instruct to take blood pressure medications as prescribed. - Advised to take medications before appointments for accurate readings. - Encourage follow-up with cardiology in September. - F/U in 1 month  Onychocryptosis (ingrown toenails) Onychocryptosis with burning and hangnail sensation in both toenails. Previous self-management may have worsened condition. - Refer to podiatrist for evaluation and management. - Advised to soak feet in warm water with Epsom salt for 20 minutes daily. - Instruct to cut toenails straight across to prevent future issues.  Subjective:    Outpatient Medications Prior to Visit  Medication Sig Dispense Refill   albuterol  (VENTOLIN  HFA) 108 (90 Base) MCG/ACT inhaler Inhale 2 puffs into the lungs every 6 (six) hours as needed for wheezing or shortness of breath. 6.7 g 5   blood glucose meter kit and supplies KIT Dispense based on patient and insurance preference. Use up to four times daily as directed. (FOR ICD-9 250.00, 250.01). For QAC - HS accuchecks. 1 each 1   Blood Glucose Monitoring Suppl (BLOOD GLUCOSE MONITOR SYSTEM) w/Device KIT Use to test blood sugar in the morning, at noon, and at bedtime. 1 kit 0   carvedilol  (COREG ) 25 MG tablet Take 1 tablet (25 mg total) by mouth 2 (two) times daily with a meal. 60 tablet 1   cyanocobalamin  (  VITAMIN B12) 1000 MCG tablet Take 1 tablet (1,000 mcg total) by mouth daily. 30 tablet 5   fenofibrate  (TRICOR ) 145 MG tablet Take 1 tablet (145 mg total) by mouth daily. 30 tablet 5   ferrous sulfate  325 (65 FE) MG EC tablet Take 1 tablet (325 mg total) by mouth daily  with breakfast. 30 tablet 5   folic acid  (FOLVITE ) 1 MG tablet Take 1 tablet (1 mg total) by mouth daily. 30 tablet 11   furosemide  (LASIX ) 40 MG tablet Take 1 tablet (40 mg total) by mouth every morning. 30 tablet 5   Insulin  Pen Needle 32G X 4 MM MISC For 4 times a day insulin  Subcutaneously. 100 each 0   metFORMIN  (GLUCOPHAGE -XR) 500 MG 24 hr tablet Take 1 tablet (500 mg total) by mouth 2 (two) times daily. 30 tablet 5   mometasone -formoterol  (DULERA ) 200-5 MCG/ACT AERO Inhale 2 puffs into the lungs 2 (two) times daily. 13 g 0   sacubitril -valsartan  (ENTRESTO ) 49-51 MG Take 1 tablet by mouth 2 (two) times daily. 60 tablet 6   spironolactone  (ALDACTONE ) 25 MG tablet Take 0.5 tablets (12.5 mg total) by mouth daily. 45 tablet 3   Insulin  Glargine w/ Trans Port (BASAGLAR  TEMPO PEN) 100 UNIT/ML SOPN Inject 20 Units into the skin 2 (two) times daily. Take after breakfast and dinner. (Patient not taking: Reported on 01/14/2024) 12 mL 1   No facility-administered medications prior to visit.   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   Body mass index (BMI) 30.0-30.9, adult 12/12/2023   CAP (community acquired pneumonia) 03/22/2023   Diabetes mellitus, type II (HCC) 2015   Diagnosed during 2015 admission to [facility]   Diabetic ketoacidosis without coma associated with type 2 diabetes mellitus (HCC) 09/13/2019   Diffuse 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   Hemangioma of spine 2015   S/P excision at [facility] with pathology favoring benign Hemangioma   Influenza A 06/17/2023   Multifocal pneumonia 07/29/2023   Numbness and  tingling of both legs 03/22/2014   Obesity 12/12/2023   Paraplegia (HCC) 03/22/2014   Severe sepsis (HCC) 03/22/2023   Spinal hemangioma 03/23/2014   Thoracic myelopathy 03/22/2014   Past Surgical History:  Procedure Laterality Date   LAMINECTOMY N/A 03/22/2014   Procedure: THORACIC two - four  LAMINECTOMY FOR TUMOR;  Surgeon: Victory DELENA Gunnels, MD;  Location: [facility];  Service: Neurosurgery;  Laterality: N/A;  Throacic two  - four laminectomy for tumor   LEG SURGERY     Allergies  Allergen Reactions   Mustard Hives, Itching and Rash      Objective:    Physical Exam Vitals and nursing note reviewed.  Constitutional:      General: He is not in acute distress.    Appearance: Normal appearance.  HENT:     Head: Normocephalic.  Cardiovascular:     Rate and Rhythm: Normal rate and regular rhythm.  Pulmonary:     Effort: Pulmonary effort is normal.     Breath sounds: Normal breath sounds.  Musculoskeletal:        General: Normal range of motion.     Cervical back: Normal range  of motion.  Feet:     Right foot:     Skin integrity: Dry skin present.     Toenail Condition: Right toenails are ingrown.     Left foot:     Skin integrity: Dry skin present.     Toenail Condition: Left toenails are ingrown.  Skin:    General: Skin is warm and dry.     Comments: Swelling and mild redness noted on left great toenail, mild swelling on right great toenail, darkened nail noted on both nails.  Neurological:     Mental Status: He is alert and oriented to person, place, and time.  Psychiatric:        Mood and Affect: Mood normal.    BP (!) 133/94 (BP Location: Left Arm, Patient Position: Sitting, Cuff Size: Large)   Pulse (!) 102   Temp 98.1 F (36.7 C) (Temporal)   Ht 5' 8 (1.727 m)   Wt 191 lb 4 oz (86.8 kg)   SpO2 97%   BMI 29.08 kg/m  Wt Readings from Last 3 Encounters:  01/14/24 191 lb 4 oz (86.8 kg)  12/13/23 188 lb (85.3 kg)  12/12/23 188 lb 6 oz (85.4 kg)       Lucius Krabbe, NP

## 2024-01-14 NOTE — Patient Instructions (Addendum)
 It was very nice to see you today!   Let me know if the Basaglar  insulin  is still too expensive. Inject 26 units twice a day. Keep using the fast insulin  (Novolog ) as ordered but you have to check your blood sugar in the mornings before eating & # should be less than 130 - also need to check in the evenings and should be less than 150-160. If you are still getting high readings you have to call and let me know!  Please schedule a 4 week follow up visit today.      PLEASE NOTE:  If you had any lab tests please let us  know if you have not heard back within a few days. You may see your results on MyChart before we have a chance to review them but we will give you a call once they are reviewed by us . If we ordered any referrals today, please let us  know if you have not heard from their office within the next week.

## 2024-01-15 NOTE — Telephone Encounter (Signed)
 Reached out to patient again seeking affirmation if PAP application was received- no answer or v/m. Keep getting your call cannot be completed

## 2024-01-15 NOTE — Telephone Encounter (Signed)
 Reached out again to find out if PAP application ws received for Basaglar  ([facility]) no answer or v/m get recording only.

## 2024-01-16 ENCOUNTER — Other Ambulatory Visit (HOSPITAL_COMMUNITY): Payer: Self-pay

## 2024-01-20 ENCOUNTER — Other Ambulatory Visit (HOSPITAL_COMMUNITY): Payer: Self-pay

## 2024-01-21 ENCOUNTER — Other Ambulatory Visit (HOSPITAL_COMMUNITY): Payer: Self-pay

## 2024-01-21 ENCOUNTER — Other Ambulatory Visit: Payer: Self-pay | Admitting: Pharmacist

## 2024-01-21 ENCOUNTER — Telehealth: Payer: Self-pay | Admitting: Pharmacist

## 2024-01-21 NOTE — Telephone Encounter (Signed)
 Attempt was made to contact patient by phone today for follow up by Clinical Pharmacist regarding diabetes and medication access.  Unable to reach patient. LM on VM with my contact number [phone].  I did review last office notes from PCP. States that he has not been taking Basaglar  due to cost.  Consulted with [facility] to see if he can use the $35 coupon card for Basaglar  (there is an option for patient's without insurance). They were able to process and he can get Basaglar  for $35. Included this information in VM I left for patient.  Our medication assistance team has also mailed him application for patient assistance program for both Basaglar  and Entresto  but they have not been able to reach Gary Conway for follow up.

## 2024-01-23 ENCOUNTER — Other Ambulatory Visit (HOSPITAL_COMMUNITY): Payer: Self-pay

## 2024-02-05 ENCOUNTER — Ambulatory Visit: Payer: MEDICAID | Attending: Cardiovascular Disease | Admitting: Cardiology

## 2024-02-05 ENCOUNTER — Telehealth: Payer: Self-pay

## 2024-02-05 ENCOUNTER — Encounter: Payer: Self-pay | Admitting: Cardiology

## 2024-02-05 ENCOUNTER — Other Ambulatory Visit (HOSPITAL_COMMUNITY): Payer: Self-pay

## 2024-02-05 VITALS — BP 119/83 | HR 110 | Resp 97 | Ht 68.0 in | Wt 194.2 lb

## 2024-02-05 DIAGNOSIS — E1165 Type 2 diabetes mellitus with hyperglycemia: Secondary | ICD-10-CM

## 2024-02-05 DIAGNOSIS — I5032 Chronic diastolic (congestive) heart failure: Secondary | ICD-10-CM

## 2024-02-05 DIAGNOSIS — J9611 Chronic respiratory failure with hypoxia: Secondary | ICD-10-CM

## 2024-02-05 DIAGNOSIS — I10 Essential (primary) hypertension: Secondary | ICD-10-CM

## 2024-02-05 MED ORDER — METOPROLOL SUCCINATE ER 50 MG PO TB24
50.0000 mg | ORAL_TABLET | Freq: Every morning | ORAL | 3 refills | Status: AC
  Filled 2024-02-05 (×2): qty 90, 90d supply, fill #0
  Filled 2024-05-03: qty 90, 90d supply, fill #1

## 2024-02-05 NOTE — Patient Instructions (Signed)
 Medication Instructions:  Stop Carvedilol  Start Toprol  XL 50 mg every morning Continue all other medications  *If you need a refill on your cardiac medications before your next appointment, please call your pharmacy*  Lab Work: None ordered  Testing/Procedures: None ordered  Follow-Up: At [facility], you and your health needs are our priority.  As part of our continuing mission to provide you with exceptional heart care, our providers are all part of one team.  This team includes your primary Cardiologist (physician) and Advanced Practice Providers or APPs (Physician Assistants and Nurse Practitioners) who all work together to provide you with the care you need, when you need it.  Your next appointment:  6 months   Call in Nov to schedule March appointment     Provider:  Dr.Tolia           Schedule appointment with Pharm D  [facility]   We recommend signing up for the patient portal called MyChart.  Sign up information is provided on this After Visit Summary.  MyChart is used to connect with patients for Virtual Visits (Telemedicine).  Patients are able to view lab/test results, encounter notes, upcoming appointments, etc.  Non-urgent messages can be sent to your provider as well.   To learn more about what you can do with MyChart, go to [email or web address].

## 2024-02-05 NOTE — Progress Notes (Signed)
 Cardiology Office Note:    NAME:  Gary Conway    MRN: [id number] DOB:  Oct 15, 1975   PCP:  Lucius Krabbe, NP  Former Cardiology Providers: Beckey Coe, NP Primary Cardiologist:  Madonna Large, DO, [facility] (established care 02/05/2024) Electrophysiologist:  None   Referring MD: Coe Beckey CROME, NP  Reason of Consult: Chronic diastolic heart failure  Chief Complaint  Patient presents with   Chronic diastolic HF (heart failure)    New Patient (Initial Visit)    History of Present Illness:    Gary Conway is a 48 y.o. African-American male whose past medical history and cardiovascular risk factors includes: Uncontrolled diabetes mellitus type 2, hypertension, hyperlipidemia, hypertriglyceridemia HFpEF, benign spinal cord tumor (s/p resection 2015), ARDS requiring ventilation (10/24 and 1/25), Hx of DAH (11/24). He is being seen today for the evaluation of HFpEF at the request of Coe Beckey CROME, NP.  In March 2025 patient was admitted for shortness of breath and was noted to have acute hypoxic respiratory failure with multifocal pneumonia secondary to Legionella.  He completed antibiotics.  A component of his hypoxic respiratory failure was secondary to HFpEF exacerbation.  He was started on IV Lasix  and echocardiogram at that time illustrated preserved LVEF of 55 to 60%, no regional wall motion abnormalities, and trivial MR.  He was seen by advanced heart failure clinic in April 2025 his GDMT was uptitrated.  He was recommended to have a left and right heart catheterization at some point given his family history of CHF.  Patient is referred from advanced heart failure clinic to establish care with general cardiology.  He experiences intermittent shortness of breath, particularly during physical exertion, such as walking to his job. He feels winded after a six-minute walk. No chest pain is present.  Occasional swelling in the foot occurs with prolonged standing or sitting. The swelling  is intermittent.  He has had two episodes of waking up at night gasping for air, with the most recent episode occurring a few nights ago.  Current Medications: Current Meds  Medication Sig   albuterol  (VENTOLIN  HFA) 108 (90 Base) MCG/ACT inhaler Inhale 2 puffs into the lungs every 6 (six) hours as needed for wheezing or shortness of breath.   blood glucose meter kit and supplies KIT Dispense based on patient and insurance preference. Use up to four times daily as directed. (FOR ICD-9 250.00, 250.01). For QAC - HS accuchecks.   Blood Glucose Monitoring Suppl (BLOOD GLUCOSE MONITOR SYSTEM) w/Device KIT Use to test blood sugar in the morning, at noon, and at bedtime.   cyanocobalamin  (VITAMIN B12) 1000 MCG tablet Take 1 tablet (1,000 mcg total) by mouth daily.   fenofibrate  (TRICOR ) 145 MG tablet Take 1 tablet (145 mg total) by mouth daily.   ferrous sulfate  325 (65 FE) MG EC tablet Take 1 tablet (325 mg total) by mouth daily with breakfast.   folic acid  (FOLVITE ) 1 MG tablet Take 1 tablet (1 mg total) by mouth daily.   furosemide  (LASIX ) 40 MG tablet Take 1 tablet (40 mg total) by mouth every morning.   Insulin  Glargine (BASAGLAR  KWIKPEN) 100 UNIT/ML Inject 26 Units into the skin 2 (two) times daily.   Insulin  Pen Needle 32G X 4 MM MISC For 4 times a day insulin  Subcutaneously.   metFORMIN  (GLUCOPHAGE -XR) 500 MG 24 hr tablet Take 1 tablet (500 mg total) by mouth 2 (two) times daily.   metoprolol  succinate (TOPROL -XL) 50 MG 24 hr tablet Take 1 tablet (50 mg total)  by mouth every morning.   mometasone -formoterol  (DULERA ) 200-5 MCG/ACT AERO Inhale 2 puffs into the lungs 2 (two) times daily.   sacubitril -valsartan  (ENTRESTO ) 49-51 MG Take 1 tablet by mouth 2 (two) times daily.   spironolactone  (ALDACTONE ) 25 MG tablet Take 0.5 tablets (12.5 mg total) by mouth daily.   [DISCONTINUED] carvedilol  (COREG ) 25 MG tablet Take 1 tablet (25 mg total) by mouth 2 (two) times daily with a meal.     Allergies:     Mustard   Past Medical History: 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   Body mass index (BMI) 30.0-30.9, adult 12/12/2023   CAP (community acquired pneumonia) 03/22/2023   Diabetes mellitus, type II (HCC) 2015   Diagnosed during 2015 admission to [facility]   Diabetic ketoacidosis without coma associated with type 2 diabetes mellitus (HCC) 09/13/2019   Diffuse 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   Hemangioma of spine 2015   S/P excision at [facility] with pathology favoring benign Hemangioma   Influenza A 06/17/2023   Multifocal pneumonia 07/29/2023   Numbness and tingling of both legs 03/22/2014   Obesity 12/12/2023   Paraplegia (HCC) 03/22/2014   Severe sepsis (HCC) 03/22/2023   Spinal hemangioma 03/23/2014   Thoracic myelopathy 03/22/2014    Past Surgical History: Past Surgical History:  Procedure Laterality Date   LAMINECTOMY N/A 03/22/2014   Procedure: THORACIC two - four  LAMINECTOMY FOR TUMOR;  Surgeon: Victory DELENA Gunnels, MD;  Location: [facility];  Service: Neurosurgery;  Laterality: N/A;  Throacic two  - four laminectomy for tumor   LEG SURGERY      Social History: Social History   Tobacco Use   Smoking status: Never   Smokeless tobacco: Never  Vaping Use   Vaping status: Never Used  Substance Use Topics   Alcohol use: Not Currently    Comment: social   Drug use: No    Family History: Family History  Problem Relation Age of Onset   Breast cancer Mother    Pancreatic cancer Father     ROS:   Review of Systems  Cardiovascular:  Positive for dyspnea on exertion, leg swelling and  paroxysmal nocturnal dyspnea. Negative for chest pain, claudication, irregular heartbeat, near-syncope, orthopnea, palpitations and syncope.  Respiratory:  Negative for shortness of breath.   Hematologic/Lymphatic: Negative for bleeding problem.    EKGs/Labs/Other Studies Reviewed:   EKG: EKG Interpretation Date/Time:  Wednesday February 05 2024 14:40:43 EDT Ventricular Rate:  113 PR Interval:  136 QRS Duration:  74 QT Interval:  334 QTC Calculation: 458 R Axis:   50  Text Interpretation: Sinus tachycardia Nonspecific T wave abnormality When compared with ECG of 09-Dec-2023 17:55, No significant change was found Confirmed by Michele Richardson [phone]) on 02/05/2024 2:58:17 PM  Echocardiogram: 03/25/2023: LVEF 50 to 55%, low normal function, right ventricular size and function normal, estimated RAP 3 mmHg.  07/30/2023  1. Left ventricular ejection fraction, by estimation, is 55 to 60%. The  left ventricle has normal function. The left ventricle has no regional  wall motion abnormalities. Indeterminate diastolic filling due to E-A  fusion.   2. Right ventricular systolic function is normal. The right  ventricular  size is normal. Tricuspid regurgitation signal is inadequate for assessing  PA pressure.   3. The mitral valve is grossly normal. Trivial mitral valve  regurgitation. No evidence of mitral stenosis.   4. The aortic valve is tricuspid. Aortic valve regurgitation is not  visualized. No aortic stenosis is present.   5. The inferior vena cava is normal in size with greater than 50%  respiratory variability, suggesting right atrial pressure of 3 mmHg.   Labs:    Latest Ref Rng & Units 12/13/2023    3:46 PM 12/13/2023    1:27 PM 12/09/2023    5:55 PM  CBC  WBC 4.0 - 10.5 K/uL  7.6    Hemoglobin 13.0 - 17.0 g/dL 84.6  86.8  85.3   Hematocrit 39.0 - 52.0 % 45.0  39.6  43.0   Platelets 150 - 400 K/uL  182         Latest Ref Rng & Units 12/13/2023    3:46 PM 12/13/2023    1:27  PM 12/12/2023   10:43 AM  BMP  Glucose 70 - 99 mg/dL  408  459   BUN 6 - 20 mg/dL  28  25   Creatinine 9.38 - 1.24 mg/dL  8.19  8.15   Sodium 864 - 145 mmol/L 128  127  127   Potassium 3.5 - 5.1 mmol/L 4.8  5.0  4.4   Chloride 98 - 111 mmol/L  92  88   CO2 22 - 32 mmol/L  23  29   Calcium  8.9 - 10.3 mg/dL  9.4  9.6       Latest Ref Rng & Units 12/13/2023    3:46 PM 12/13/2023    1:27 PM 12/12/2023   10:43 AM  CMP  Glucose 70 - 99 mg/dL  408  459   BUN 6 - 20 mg/dL  28  25   Creatinine 9.38 - 1.24 mg/dL  8.19  8.15   Sodium 864 - 145 mmol/L 128  127  127   Potassium 3.5 - 5.1 mmol/L 4.8  5.0  4.4   Chloride 98 - 111 mmol/L  92  88   CO2 22 - 32 mmol/L  23  29   Calcium  8.9 - 10.3 mg/dL  9.4  9.6   Total Protein 6.5 - 8.1 g/dL  7.3  7.5   Total Bilirubin 0.0 - 1.2 mg/dL  1.0  0.7   Alkaline Phos 38 - 126 U/L  66  78   AST 15 - 41 U/L  17  18   ALT 0 - 44 U/L  16  20     Lab Results  Component Value Date   CHOL 208 (H) 12/12/2023   HDL 32.20 (L) 12/12/2023   LDLDIRECT 42.0 12/12/2023   TRIG (H) 12/12/2023    542.0 Triglyceride is over 400; calculations on Lipids are invalid.   CHOLHDL 6 12/12/2023   No results for input(s): LIPOA in the last 8760 hours. No components found for: NTPROBNP No results for input(s): PROBNP in the last 8760 hours. Recent Labs    08/02/23 0544  TSH 1.156    Physical Exam:    Today's Vitals   02/05/24 1442  BP: 119/83  Pulse: (!) 110  Resp: (!) 97  Weight: 194 lb 3.2 oz (88.1 kg)  Height: 5' 8 (1.727 m)   Body mass index is 29.53 kg/m. Wt Readings from Last 3 Encounters:  02/11/24 195 lb 3.2 oz (88.5 kg)  02/05/24 194 lb 3.2 oz (88.1 kg)  01/14/24 191 lb 4 oz (86.8 kg)    Physical Exam  Constitutional: No distress.  hemodynamically stable  Neck: No JVD present.  Cardiovascular: Normal rate, regular rhythm, S1 normal and S2 normal. Exam reveals no gallop, no S3 and no S4.  No murmur heard. Pulmonary/Chest: Effort  normal and breath sounds normal. No stridor. He has no wheezes. He has no rales.  Musculoskeletal:        General: No edema.     Cervical back: Neck supple.  Skin: Skin is warm.     Impression & Recommendation(s):  Impression:   ICD-10-CM   1. Chronic diastolic HF (heart failure) (HCC)  I50.32 EKG 12-Lead    AMB Referral to [facility] Pharm-D    2. Chronic hypoxic respiratory failure (HCC)  J96.11     3. Primary hypertension  I10     4. Controlled type 2 diabetes mellitus with hyperglycemia, without long-term current use of insulin  (HCC)  E11.65        Recommendation(s):  Chronic diastolic HF (heart failure) (HCC) Stage C, NYHA class II/III Discontinue carvedilol , start Toprol -XL 50 mg p.o. daily, baseline tachycardia. Continue Entresto  49/51 mg p.o. twice daily. Continue spironolactone  12.5 mg p.o. daily. Continue Lasix  40 mg p.o. every morning SGLT2 inhibitors have been held off thus far given his history of DKA. Will hold off on uptitration of GDMT at this time as patient needs assistance with medication coverage.  Reached out to Pharm.D. assistance to help patient apply for patient assistance Will refer patient to Pharm.D. for uptitration of GDMT as hemodynamics and laboratory values allow  Chronic hypoxic respiratory failure (HCC) Multifactorial-history of ARDS 02/2023, 05/2023; history of influenza, diffuse alveolar hemorrhage on his prior bronchoscopy Follows with pulmonary medicine  Primary hypertension Office blood pressures are well-controlled. Does not check his blood pressures at home. Advised him to start checking ambulatory blood pressure readings as well as weight Medications as discussed above  Controlled type 2 diabetes mellitus with hyperglycemia, without long-term current use of insulin  (HCC) Reemphasize importance of glycemic control. A1c 8.9, June 2025 A1c 6.8, March 2025  Orders Placed:  Orders Placed This Encounter  Procedures   AMB Referral to  [facility] Pharm-D    Referral Priority:   Routine    Referral Type:   Consultation    Referral Reason:   Specialty Services Required    Number of Visits Requested:   1   EKG 12-Lead     Final Medication List:    Meds ordered this encounter  Medications   metoprolol  succinate (TOPROL -XL) 50 MG 24 hr tablet    Sig: Take 1 tablet (50 mg total) by mouth every morning.    Dispense:  90 tablet    Refill:  3    Medications Discontinued During This Encounter  Medication Reason   carvedilol  (COREG ) 25 MG tablet Discontinued by provider     Current Outpatient Medications:    albuterol  (VENTOLIN  HFA) 108 (90 Base) MCG/ACT inhaler, Inhale 2 puffs into the lungs every 6 (six) hours as needed for wheezing or shortness of breath., Disp: 6.7 g, Rfl: 5   blood glucose meter kit and supplies KIT, Dispense based on patient and insurance preference. Use up to four times daily as directed. (FOR ICD-9 250.00, 250.01). For QAC - HS accuchecks., Disp: 1 each, Rfl: 1   Blood Glucose Monitoring Suppl (BLOOD GLUCOSE MONITOR SYSTEM) w/Device KIT, Use to test blood sugar in the morning, at noon,  and at bedtime., Disp: 1 kit, Rfl: 0   cyanocobalamin  (VITAMIN B12) 1000 MCG tablet, Take 1 tablet (1,000 mcg total) by mouth daily., Disp: 30 tablet, Rfl: 5   fenofibrate  (TRICOR ) 145 MG tablet, Take 1 tablet (145 mg total) by mouth daily., Disp: 30 tablet, Rfl: 5   ferrous sulfate  325 (65 FE) MG EC tablet, Take 1 tablet (325 mg total) by mouth daily with breakfast., Disp: 30 tablet, Rfl: 5   folic acid  (FOLVITE ) 1 MG tablet, Take 1 tablet (1 mg total) by mouth daily., Disp: 30 tablet, Rfl: 11   furosemide  (LASIX ) 40 MG tablet, Take 1 tablet (40 mg total) by mouth every morning., Disp: 30 tablet, Rfl: 5   Insulin  Glargine (BASAGLAR  KWIKPEN) 100 UNIT/ML, Inject 26 Units into the skin 2 (two) times daily., Disp: 15 mL, Rfl: 5   Insulin  Pen Needle 32G X 4 MM MISC, For 4 times a day insulin  Subcutaneously., Disp: 100 each,  Rfl: 0   metFORMIN  (GLUCOPHAGE -XR) 500 MG 24 hr tablet, Take 1 tablet (500 mg total) by mouth 2 (two) times daily., Disp: 30 tablet, Rfl: 5   metoprolol  succinate (TOPROL -XL) 50 MG 24 hr tablet, Take 1 tablet (50 mg total) by mouth every morning., Disp: 90 tablet, Rfl: 3   mometasone -formoterol  (DULERA ) 200-5 MCG/ACT AERO, Inhale 2 puffs into the lungs 2 (two) times daily., Disp: 13 g, Rfl: 0   sacubitril -valsartan  (ENTRESTO ) 49-51 MG, Take 1 tablet by mouth 2 (two) times daily., Disp: 60 tablet, Rfl: 6   spironolactone  (ALDACTONE ) 25 MG tablet, Take 0.5 tablets (12.5 mg total) by mouth daily., Disp: 45 tablet, Rfl: 3  Consent:   N/A  Disposition:   6 months follow-up sooner if needed  His questions and concerns were addressed to his satisfaction. He voices understanding of the recommendations provided during this encounter.    Signed, Madonna Michele HAS, [facility]  A Division of [facility] [address], [place], [place] [address]

## 2024-02-05 NOTE — Telephone Encounter (Signed)
 Done.  RN is faxing them now.   Kwane Rohl Accident, DO, FACC

## 2024-02-05 NOTE — Telephone Encounter (Signed)
 Please have provider sign and date completed provider portion (see chart media). Please fax the completed form back to [phone]. Thanks in advance for your time!

## 2024-02-05 NOTE — Telephone Encounter (Signed)
 Patient completing their portion in clinic.

## 2024-02-06 NOTE — Telephone Encounter (Signed)
 Reached out to patient again and received recording  cannot connect at this time.Fpr PAP application [employer]  (lilly). Reached out to Sister in Emergency contact and left HIPAA compliant v/m for patient to return call to my direct phone number.

## 2024-02-10 ENCOUNTER — Telehealth: Payer: Self-pay | Admitting: Licensed Clinical Social Worker

## 2024-02-10 NOTE — Progress Notes (Signed)
 Heart and Vascular Care Navigation  02/10/2024  Gary Conway [birth year] [id number]  Reason for Referral: self pay, not Medicaid eligible  Engaged with patient by telephone for initial visit for Heart and Vascular Care Coordination.                                                                                                   Assessment:    LCSW received a call back from pt. Introduced self, role, reason for call. Confirmed home address, PCP, resides with mother and works full time. Shares he does not have any coverage, frustrated by being over income for Medicaid as he doesn't make much money. Discussed eligibility and options for [employer] as well as marketplace coverage if interested. Pt has been considering his current employment as physically he has continued to have a hard time completing duties and his company will usually not honor restrictions indefinitely. We discussed if he files for social security disability it can take 9+ months for processing claims. Pt also trying to work with housing authority for low income housing options as he desires to live on his own. We discussed assistance with medical bills through [facility] program and how to apply, also discussed med assistance through [facility]. Pt agreeable to this information being sent to home address. Encouraged him to call me as needed. Will also send a card about SNAP application assistance if eligible through [employer]. No additional questions at this time. Med assistance pending with assistance of RxAssistance Team                                      HRT/VAS Care Coordination     Patients Home Cardiology Office [facility]   Outpatient Care Team Social Worker   Social Worker Name: Marit Lark, [place], [phone]   Living arrangements for the past 2 months Apartment   Lives with: Parents   Patient Current Insurance Coverage Self-Pay   Patient Has Concern With  Paying Medical Bills Yes   Patient Concerns With Medical Bills ineligible for expansion Medicaid   Medical Bill Referrals: CAFA   Does Patient Have Prescription Coverage? No   Patient Prescription Assistance Programs [place] [facility]; Patient Assistance Programs   Home Assistive Devices/Equipment None   DME Agency NA   [facility] Agency NA       Social History:                                                                             SDOH Screenings   Food Insecurity: Food Insecurity Present (02/10/2024)  Housing: Low Risk  (02/10/2024)  Recent Concern: Housing - High Risk (12/11/2023)  Transportation Needs: No Transportation Needs (02/10/2024)  Utilities: Not  At Risk (02/10/2024)  Alcohol Screen: Low Risk  (12/11/2023)  Depression (PHQ2-9): Low Risk  (11/08/2023)  Financial Resource Strain: High Risk (02/10/2024)  Physical Activity: Insufficiently Active (12/11/2023)  Social Connections: Socially Isolated (12/11/2023)  Stress: Stress Concern Present (12/11/2023)  Tobacco Use: Low Risk  (02/05/2024)  Health Literacy: Adequate Health Literacy (02/10/2024)    SDOH Interventions: Financial Resources:  Surveyor, quantity Strain Interventions: Programmer, applications Provided (CAFA; [employer]; Engineer, site) DSS for financial assistance and Editor, commissioning for [employer] Program  Food Insecurity:  Food Insecurity Interventions: Programmer, applications Provided Financial planner; [employer] card)  Housing Insecurity:  Housing Interventions: Other (Comment) (pt residing with mother, has been speaking with housing authority)  Transportation:   Transportation Interventions: Intervention Not Indicated   Other Care Navigation Interventions:     Provided Pharmacy assistance resources [place] [facility], Patient Assistance Programs   Follow-up plan:   LCSW has mailed pt the following resources: my card, [employer] application, [employer], and food resources. Will f/u to ensure pt has received these and answer  any other questions he may have. Encouraged him to call me as needed.

## 2024-02-10 NOTE — Progress Notes (Unsigned)
 Patient ID: Gary Conway, male    DOB: September 01, 1975, 48 y.o.   MRN: [id number]  No chief complaint on file.           Assessment & Plan:   Subjective:    Outpatient Medications Prior to Visit  Medication Sig Dispense Refill   albuterol  (VENTOLIN  HFA) 108 (90 Base) MCG/ACT inhaler Inhale 2 puffs into the lungs every 6 (six) hours as needed for wheezing or shortness of breath. 6.7 g 5   blood glucose meter kit and supplies KIT Dispense based on patient and insurance preference. Use up to four times daily as directed. (FOR ICD-9 250.00, 250.01). For QAC - HS accuchecks. 1 each 1   Blood Glucose Monitoring Suppl (BLOOD GLUCOSE MONITOR SYSTEM) w/Device KIT Use to test blood sugar in the morning, at noon, and at bedtime. 1 kit 0   cyanocobalamin  (VITAMIN B12) 1000 MCG tablet Take 1 tablet (1,000 mcg total) by mouth daily. 30 tablet 5   fenofibrate  (TRICOR ) 145 MG tablet Take 1 tablet (145 mg total) by mouth daily. 30 tablet 5   ferrous sulfate  325 (65 FE) MG EC tablet Take 1 tablet (325 mg total) by mouth daily with breakfast. 30 tablet 5   folic acid  (FOLVITE ) 1 MG tablet Take 1 tablet (1 mg total) by mouth daily. 30 tablet 11   furosemide  (LASIX ) 40 MG tablet Take 1 tablet (40 mg total) by mouth every morning. 30 tablet 5   Insulin  Glargine (BASAGLAR  KWIKPEN) 100 UNIT/ML Inject 26 Units into the skin 2 (two) times daily. 15 mL 5   Insulin  Pen Needle 32G X 4 MM MISC For 4 times a day insulin  Subcutaneously. 100 each 0   metFORMIN  (GLUCOPHAGE -XR) 500 MG 24 hr tablet Take 1 tablet (500 mg total) by mouth 2 (two) times daily. 30 tablet 5   metoprolol  succinate (TOPROL -XL) 50 MG 24 hr tablet Take 1 tablet (50 mg total) by mouth every morning. 90 tablet 3   mometasone -formoterol  (DULERA ) 200-5 MCG/ACT AERO Inhale 2 puffs into the lungs 2 (two) times daily. 13 g 0   sacubitril -valsartan  (ENTRESTO ) 49-51 MG Take 1 tablet by mouth 2 (two) times daily. 60 tablet 6   spironolactone  (ALDACTONE ) 25 MG  tablet Take 0.5 tablets (12.5 mg total) by mouth daily. 45 tablet 3   No facility-administered medications prior to visit.   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   Body mass index (BMI) 30.0-30.9, adult 12/12/2023   CAP (community acquired pneumonia) 03/22/2023   Diabetes mellitus, type II (HCC) 2015   Diagnosed during 2015 admission to [facility]   Diabetic ketoacidosis without coma associated with type 2 diabetes mellitus (HCC) 09/13/2019   Diffuse 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   Hemangioma of spine 2015   S/P excision at [facility] with pathology favoring benign Hemangioma   Influenza A 06/17/2023   Multifocal pneumonia 07/29/2023   Numbness and tingling of both legs 03/22/2014   Obesity 12/12/2023   Paraplegia (HCC) 03/22/2014   Severe sepsis (HCC) 03/22/2023   Spinal hemangioma 03/23/2014   Thoracic myelopathy 03/22/2014   Past Surgical History:  Procedure Laterality Date   LAMINECTOMY N/A 03/22/2014  Procedure: THORACIC two - four  LAMINECTOMY FOR TUMOR;  Surgeon: Victory DELENA Gunnels, MD;  Location: [facility];  Service: Neurosurgery;  Laterality: N/A;  Throacic two  - four laminectomy for tumor   LEG SURGERY     Allergies  Allergen Reactions   Mustard Hives, Itching and Rash      Objective:    Physical Exam Vitals and nursing note reviewed.  Constitutional:      General: He is not in acute distress.    Appearance: Normal appearance.  HENT:     Head: Normocephalic.  Cardiovascular:     Rate and Rhythm: Normal rate and regular rhythm.  Pulmonary:     Effort: Pulmonary effort is normal.      Breath sounds: Normal breath sounds.  Musculoskeletal:        General: Normal range of motion.     Cervical back: Normal range of motion.  Skin:    General: Skin is warm and dry.  Neurological:     Mental Status: He is alert and oriented to person, place, and time.  Psychiatric:        Mood and Affect: Mood normal.    There were no vitals taken for this visit. Wt Readings from Last 3 Encounters:  02/05/24 194 lb 3.2 oz (88.1 kg)  01/14/24 191 lb 4 oz (86.8 kg)  12/13/23 188 lb (85.3 kg)       Gary Krabbe, NP

## 2024-02-11 ENCOUNTER — Ambulatory Visit (INDEPENDENT_AMBULATORY_CARE_PROVIDER_SITE_OTHER): Payer: Self-pay | Admitting: Family

## 2024-02-11 ENCOUNTER — Encounter: Payer: Self-pay | Admitting: Family

## 2024-02-11 VITALS — BP 147/105 | HR 88 | Temp 97.6°F | Ht 68.0 in | Wt 195.2 lb

## 2024-02-11 DIAGNOSIS — E1169 Type 2 diabetes mellitus with other specified complication: Secondary | ICD-10-CM

## 2024-02-11 DIAGNOSIS — E1159 Type 2 diabetes mellitus with other circulatory complications: Secondary | ICD-10-CM

## 2024-02-11 DIAGNOSIS — I5032 Chronic diastolic (congestive) heart failure: Secondary | ICD-10-CM

## 2024-02-11 DIAGNOSIS — I152 Hypertension secondary to endocrine disorders: Secondary | ICD-10-CM

## 2024-02-11 DIAGNOSIS — E669 Obesity, unspecified: Secondary | ICD-10-CM

## 2024-02-11 DIAGNOSIS — Z794 Long term (current) use of insulin: Secondary | ICD-10-CM

## 2024-02-11 NOTE — Assessment & Plan Note (Signed)
 Heart failure management adjusted by cardiology with metoprolol  initiation and carvedilol  discontinuation. Entresto  prescribed but not taken due to cost. Pending referral to heart failure clinic pharmacy for assistance. - Ensure Entresto  availability and adherence. Confirm availability of samples or assistance from drug company. - Follow up with heart failure clinic pharmacy for medication assistance, visit note and phone # provided for SW he spoke to on the phone to follow up if any questions. - Recheck blood pressure today and monitor regularly. - Encourage adherence to low-salt diet, drinking up to 2L fluids daily.

## 2024-02-11 NOTE — Assessment & Plan Note (Signed)
 Type 2 diabetes managed with insulin  glargine. Reports Blood sugar levels within normal range, fasting 110-120 mg/dL, no readings over 799 mg/dL. Doing much better overall. - Continue insulin  glargine (Basaglar ) 26 units twice daily. - Monitor blood sugar levels daily, focusing on fasting and pre-dinner readings. - Adjust morning insulin  dose to 28 units if evening blood sugar exceeds 150-170 mg/dL. - F/U in 3 mos

## 2024-02-11 NOTE — Assessment & Plan Note (Addendum)
 Hypertension management affected by heart failure medication changes. Current blood pressure 147/105 mmHg, improved but elevated. Coreg  switched to Metoprolol  by Cardiology.  Entresto  intended for blood pressure control but unsure if taken due to cost. - Continue Metoprolol  XL 50mg  qd. - Ensure Entresto  availability and adherence to aid in blood pressure control, will contact PharmD - Advised pt to call back if he has Entresto  in the home and taking. Will prescribe Valsartan  if no Entresto  samples to take in meantime. - Encourage adherence to low-salt diet and 2L water daily. - F/U in 3 mos

## 2024-02-11 NOTE — Patient Instructions (Addendum)
 It was very nice to see you today!  Please let me know if you do have the Entresto  (Sacubitril -valsartan ) medication at home and taking twice a day. This is for your heart and blood pressure.      PLEASE NOTE:  If you had any lab tests please let us  know if you have not heard back within a few days. You may see your results on MyChart before we have a chance to review them but we will give you a call once they are reviewed by us . If we ordered any referrals today, please let us  know if you have not heard from their office within the next week.

## 2024-02-12 ENCOUNTER — Telehealth: Payer: Self-pay | Admitting: Pharmacist

## 2024-02-12 ENCOUNTER — Telehealth: Payer: Self-pay

## 2024-02-12 NOTE — Telephone Encounter (Signed)
-----   Message from Lucius Krabbe sent at 02/11/2024  6:03 PM EDT ----- Regarding: call patient Please call and ask if he has the Entresto  medication bottle at home (make sure he is looking!). If not, I will prescribe Valsartan  to help his blood pressure until we know if the drug company will cover the cost. Let me know, thanks.

## 2024-02-12 NOTE — Telephone Encounter (Signed)
 Called [employer] patient assistance program to check on application status. Per [employer] representative they have not received a faxed application yet. Forwarding to [employer] / covering team for follow up.

## 2024-02-12 NOTE — Telephone Encounter (Signed)
 I reached out to pt and left a voicemail.

## 2024-02-12 NOTE — Telephone Encounter (Signed)
 Patient's PCP sent message for an update on Entresto  patient assistance program application. It looks like it might have been faxed to [employer] patient assistance program 02/05/2024. I called [employer] patient assistance program to check on app but per [employer], they do no have an application on file yet. Sent message to Ileana Lehmann, pharm tech that was working on application for follow up.  PCP is considering valsartan  addition just until Entresto  due to high blood pressure.   Please see patient assistance program phone visit from 02/05/2024 for Entresto  patient assistance program application process.

## 2024-02-15 ENCOUNTER — Encounter: Payer: Self-pay | Admitting: Cardiology

## 2024-02-17 ENCOUNTER — Telehealth (HOSPITAL_BASED_OUTPATIENT_CLINIC_OR_DEPARTMENT_OTHER): Payer: Self-pay | Admitting: Licensed Clinical Social Worker

## 2024-02-17 NOTE — Telephone Encounter (Signed)
 H&V Care Navigation CSW Progress Note  Clinical Social Worker received a call from pt [phone]) to f/u on our call from a few weeks ago. I inquired if pt has received assistance resources in the mail. He states he hasn't checked his mailbox but will today after wok. I asked that he do so so he could see if he had received those. Shared that they were assistance applications for medical bill assistance through [facility], an application for [employer] to assist with generic medications and information about food assistance. I clarified that I do not assist with locating housing, happy to provide any information about housing programs but that the teams he is working with through [employer] would be a better resource for what is available in his income. Cautioned if he is to stop working while applying for long term disability that there is not housing programs at this time for those without any income that have immediate availability. Pt is still considering applying for disability- we discussed he can make an appt directly with Social Security to discuss work history but ultimately eligibility is determined by a review of medical records performed by [employer]. If pt should want assistance with this process we can send formal referral if he signs release form for [facility] who assists with the disability filing process. Pt states understanding. Encouraged him to call me back to confirm if he has received those resources, if not by Wednesday I will f/u and we will figure out the best way to re-send or have pt pick up applications at clinic.  Patient is participating in a Managed Medicaid Plan:  No, self pay only  SDOH Screenings   Food Insecurity: Food Insecurity Present (02/10/2024)  Housing: Low Risk  (02/10/2024)  Recent Concern: Housing - High Risk (12/11/2023)  Transportation Needs: No Transportation Needs (02/10/2024)  Utilities: Not At Risk (02/10/2024)  Alcohol  Screen: Low Risk  (12/11/2023)  Depression (PHQ2-9): Medium Risk (02/11/2024)  Financial Resource Strain: High Risk (02/10/2024)  Physical Activity: Insufficiently Active (12/11/2023)  Social Connections: Socially Isolated (12/11/2023)  Stress: Stress Concern Present (12/11/2023)  Tobacco Use: Low Risk  (02/15/2024)  Health Literacy: Adequate Health Literacy (02/10/2024)     Marit Lark, MSW, LCSW Clinical Social Worker II [facility] Health Heart/Vascular Care Navigation  [phone]- work cell phone (preferred)

## 2024-02-18 NOTE — Telephone Encounter (Signed)
 We were waiting on the clinic portion in order to send the completed application.

## 2024-02-18 NOTE — Telephone Encounter (Signed)
 Our team has patient portion (not scanned to media due to full SSN on documents).   Per [employer], signed provider portion has not been received. Please have provider sign and date completed provider portion (see chart media). Please fax the completed form back to [phone].

## 2024-02-18 NOTE — Telephone Encounter (Signed)
 Bria with [employer] called in stating they received however it is missing MD signature, please advise

## 2024-02-18 NOTE — Telephone Encounter (Signed)
 Refaxed provider portion to company

## 2024-02-18 NOTE — Telephone Encounter (Signed)
 Gary Conway,  I signed it during the last office visit and you faxed it right?  Jirah Rider Deering, DO, FACC

## 2024-02-20 ENCOUNTER — Other Ambulatory Visit: Payer: Self-pay | Admitting: Pharmacist

## 2024-02-20 NOTE — Telephone Encounter (Signed)
 Paper work was signed and faxed by Reena Lunger RN today.

## 2024-02-20 NOTE — Progress Notes (Signed)
 02/20/2024 Name: Gary Conway MRN: [id number] DOB: 11/02/1975  Chief Complaint  Patient presents with   Medication Management    Gary Conway is a 48 y.o. year old male who presented for a telephone visit.   They were referred to the pharmacist by their PCP for assistance in managing diabetes and medication access.    Subjective:  Care Team: Primary Care Provider: Lucius Krabbe, NP ; Next Scheduled Visit: 04/2024  Medication Access/Adherence  Current Pharmacy:  [facility] [address] [place] [address] Phone: [phone] Fax: [phone]  [facility] [address] [place] [address] Phone: [phone] Fax: [phone]   Patient reports affordability concerns with their medications: Yes  Patient reports access/transportation concerns to their pharmacy: No  Patient reports adherence concerns with their medications:  Yes  - has not been taking Entresto  due to cost. Patient is in process of applying for medication assistance program but needed updated form form cardiology office. Look like provider portion of application was signed and faxed to [employer] medication assistance program today.     Diabetes:  Current medications: metformin  ER 500mg  twice a day; Basaglar  26 units twice a day (using coupon to get Basaglar  at lower cost)    Current glucose readings: 130's and 140's    Patient denies hypoglycemic s/sx including no dizziness, shakiness, sweating. Patient denies hyperglycemic symptoms including no polyuria, polydipsia, polyphagia, nocturia, neuropathy, blurred vision.   Current medication access support: coupon for Basaglar   Macrovascular and Microvascular Risk Reduction:  Statin? no; no history of statin therapy; ACEi/ARB? Shold be on Entresto  for CHF but patient assistance program application is pending Last urinary albumin /creatinine ratio:  Lab  Results  Component Value Date   MICRALBCREAT 41.7 (H) 12/12/2023   Last eye exam:   Last foot exam: No foot exam found Tobacco Use:  Tobacco Use: Low Risk  (02/15/2024)   Patient History    Smoking Tobacco Use: Never    Smokeless Tobacco Use: Never    Passive Exposure: Not on file   Heart Failure with preserved EF  (EF 55-60%):  Current medications:  ACEi/ARB/ARNI: should be on Entresto  but patient assistance program application is pending SGLT2i: no Beta blocker: yes - metoprolol  ER 50mg  each morning Mineralocorticoid Receptor Antagonist: yes - spironolactone  25mg  0.5 tablet daily  Diuretic regimen: furosemide  40mg  daily   BP Readings from Last 3 Encounters:  02/11/24 (!) 147/105  02/05/24 119/83  01/14/24 (!) 133/94    Patient denies volume overload signs or symptoms including no shortness of breath, lower extremity edema, increased use of pillows at night He does report that he has difficulty keeping up with demands at work. He works on a [employer].    Objective:  Lab Results  Component Value Date   HGBA1C 8.9 (A) 11/08/2023    Lab Results  Component Value Date   CREATININE 1.80 (H) 12/13/2023   BUN 28 (H) 12/13/2023   NA 128 (L) 12/13/2023   K 4.8 12/13/2023   CL 92 (L) 12/13/2023   CO2 23 12/13/2023    Lab Results  Component Value Date   CHOL 208 (H) 12/12/2023   HDL 32.20 (L) 12/12/2023   LDLDIRECT 42.0 12/12/2023   TRIG (H) 12/12/2023    542.0 Triglyceride is over 400; calculations on Lipids are invalid.   CHOLHDL 6 12/12/2023    Medications Reviewed Today     Reviewed by Carla Milling, RPH-CPP (Pharmacist) on 02/20/24 at  1635  Med List Status: <None>   Medication Order Taking? Sig Documenting Provider Last Dose Status Informant  albuterol  (VENTOLIN  HFA) 108 (90 Base) MCG/ACT inhaler [id number] Yes Inhale 2 puffs into the lungs every 6 (six) hours as needed for wheezing or shortness of breath. Lucius Krabbe, NP  Active   blood glucose  meter kit and supplies KIT [id number] Yes Dispense based on patient and insurance preference. Use up to four times daily as directed. (FOR ICD-9 250.00, 250.01). For QAC - HS accuchecks. Singh, Prashant K, MD  Active Self, Pharmacy Records  Blood Glucose Monitoring Suppl (BLOOD GLUCOSE MONITOR SYSTEM) w/Device KIT [id number] Yes Use to test blood sugar in the morning, at noon, and at bedtime. Singh, Prashant K, MD  Active Self, Pharmacy Records  cyanocobalamin  (VITAMIN B12) 1000 MCG tablet [id number]  Take 1 tablet (1,000 mcg total) by mouth daily. Lucius Krabbe, NP  Active   fenofibrate  (TRICOR ) 145 MG tablet [id number] Yes Take 1 tablet (145 mg total) by mouth daily. Lucius Krabbe, NP  Active   ferrous sulfate  325 (65 FE) MG EC tablet [id number] Yes Take 1 tablet (325 mg total) by mouth daily with breakfast. Lucius Krabbe, NP  Active   folic acid  (FOLVITE ) 1 MG tablet [id number] Yes Take 1 tablet (1 mg total) by mouth daily. Lucius Krabbe, NP  Active   furosemide  (LASIX ) 40 MG tablet [id number] Yes Take 1 tablet (40 mg total) by mouth every morning. Lucius Krabbe, NP  Active   Insulin  Glargine (BASAGLAR  KWIKPEN) 100 UNIT/ML [id number] Yes Inject 26 Units into the skin 2 (two) times daily. Lucius Krabbe, NP  Active   Insulin  Pen Needle 32G X 4 MM MISC [id number] Yes For 4 times a day insulin  Subcutaneously. Singh, Prashant K, MD  Active Self, Pharmacy Records  metFORMIN  (GLUCOPHAGE -XR) 500 MG 24 hr tablet [id number] Yes Take 1 tablet (500 mg total) by mouth 2 (two) times daily. Lucius Krabbe, NP  Active   metoprolol  succinate (TOPROL -XL) 50 MG 24 hr tablet [id number] Yes Take 1 tablet (50 mg total) by mouth every morning. Tolia, Sunit, DO  Active   mometasone -formoterol  (DULERA ) 200-5 MCG/ACT AERO [id number] Yes Inhale 2 puffs into the lungs 2 (two) times daily. Elgergawy, Brayton RAMAN, MD  Active   sacubitril -valsartan  (ENTRESTO ) 49-51 MG [id number]  Take 1 tablet by mouth 2 (two)  times daily.  Patient not taking: Reported on 02/20/2024   Lucius Krabbe, NP  Active   spironolactone  (ALDACTONE ) 25 MG tablet [id number] Yes Take 0.5 tablets (12.5 mg total) by mouth daily. Lucius Krabbe, NP  Active               Assessment/Plan:   Diabetes: - Currently uncontrolled; goal A1c <7%. Cardiorenal risk reduction is opportunities for improvement.. Blood pressure is not at goal <130/80. LDL is at goal.  - Reviewed goal A1c, goal fasting, and goal 2 hour post prandial glucose. Recommended to check glucose 1 to 2 times per day ar varying times of day.  - Recommend to continue Basaglar  and metformin  at current doses. SABRA  Heart Failure: - Currently not taking ARNI due to cost.  Application for Entresto  patient assistance program has been submitted (provider portion today by cardiology). May take up to 2 weeks for review and for shipment to arrive to patient.  - Will consult with PCP about prescribing valsartan  160mg  daily until Entrestro approved.    Follow Up Plan: 2 to 3 weeks   Madelin Ray, PharmD Clinical Pharmacist [facility]  Population Health [phone]

## 2024-02-21 ENCOUNTER — Telehealth: Payer: Self-pay | Admitting: Licensed Clinical Social Worker

## 2024-02-21 NOTE — Telephone Encounter (Signed)
 I scanned the provider form in media for you

## 2024-02-21 NOTE — Telephone Encounter (Signed)
 H&V Care Navigation CSW Progress Note  Clinical Social Worker contacted patient by phone to f/u on assistance applications. LCSW had advised pt they were sent and he was going to check his mail and f/u back up with me for assistance. I have received no return call. No answer today at [phone]. Will re-attempt next week, voicemail left for pt.  Patient is participating in a Managed Medicaid Plan:  No, self pay only  SDOH Screenings   Food Insecurity: Food Insecurity Present (02/10/2024)  Housing: Low Risk  (02/10/2024)  Recent Concern: Housing - High Risk (12/11/2023)  Transportation Needs: No Transportation Needs (02/10/2024)  Utilities: Not At Risk (02/10/2024)  Alcohol Screen: Low Risk  (12/11/2023)  Depression (PHQ2-9): Medium Risk (02/11/2024)  Financial Resource Strain: High Risk (02/10/2024)  Physical Activity: Insufficiently Active (12/11/2023)  Social Connections: Socially Isolated (12/11/2023)  Stress: Stress Concern Present (12/11/2023)  Tobacco Use: Low Risk  (02/15/2024)  Health Literacy: Adequate Health Literacy (02/10/2024)    Marit Lark, MSW, LCSW Clinical Social Worker II [facility] Health Heart/Vascular Care Navigation  [phone]- work cell phone (preferred)

## 2024-02-26 NOTE — Telephone Encounter (Signed)
 Are there any other resources available for the patient to get his Entresto ? Otherwise will have to change to ARB.  Please let me know.   Cressida Milford Whitefield, DO, FACC

## 2024-02-26 NOTE — Telephone Encounter (Signed)
 Patient portion was never the issue. I got paperwork from patient in clinic but it was not scanned to media due to full SSN being on the documents. Typically we send the application all together once we have the provider portion. We had some delay/confusion getting the provider portion. Just followed up and faxed the combined documents.

## 2024-02-27 ENCOUNTER — Telehealth: Payer: Self-pay | Admitting: Pharmacist

## 2024-02-27 NOTE — Telephone Encounter (Signed)
 I spoke to [employer] patient assistance program regarding Entresto  application. As of 02/26/2024 [employer] will not accept any new enrollments for Entresto  patient assistance program. They will continue to provide Entresto  until the end of 2025 to any patient already enrolled in the program.  In 2026 Entresto  will no longer be included in their list of patient assistance program medications.   Social worker has reached out to cardiologist for recommendations for alternatives to Entresto .  I think our office, was trying to get in touch with him to change to valsartan .

## 2024-02-27 NOTE — Telephone Encounter (Signed)
 Unfortunately, no. [employer] was the only resource we had for this drug.

## 2024-02-27 NOTE — Telephone Encounter (Signed)
 Looks like Entresto  assistance is not an option as of now.  Please find out how many he has left - if he has enough until his appt w/ PharmD on 03/26/2024 will continue current course.  If he has ran out will have to transition to valsartan  160mg  po qday and BMP in one week to make sure renal and potassium are stable.   Tregan Read Franklin, DO, FACC

## 2024-02-27 NOTE — Telephone Encounter (Signed)
 Per other chart notes, he does not have any Entresto . Looks like his primary care team has been trying to start him on Valsartan . Looping in pharmD pool.

## 2024-02-28 NOTE — Telephone Encounter (Signed)
 [place],  He is seeing you on 03/26/2024 - just FYI.  [employer] is no longer providing assistance for Entresto . Will change him to losartan 160 mg p.o. daily.  And will check a BMP in 1 week.  When you see him please uptitrate GDMT as hemodynamics and laboratory values.  Madonna Large, DO, [facility]  Staff: Please place the orders and update MAR.   Chaneka Trefz Montara, DO, FACC

## 2024-03-02 ENCOUNTER — Other Ambulatory Visit (HOSPITAL_COMMUNITY): Payer: Self-pay

## 2024-03-02 MED ORDER — VALSARTAN 160 MG PO TABS
160.0000 mg | ORAL_TABLET | Freq: Every day | ORAL | 5 refills | Status: DC
  Filled 2024-03-02: qty 30, 30d supply, fill #0
  Filled 2024-05-03: qty 30, 30d supply, fill #1

## 2024-03-02 NOTE — Telephone Encounter (Signed)
Valsartan sent to pharmacy

## 2024-03-02 NOTE — Telephone Encounter (Signed)
 Yes, sorry.  Gary Casasola Putnam, DO, FACC

## 2024-03-02 NOTE — Addendum Note (Signed)
 Addended by: DARRELL BRUCKNER on: 03/02/2024 02:10 PM   Modules accepted: Orders

## 2024-03-02 NOTE — Addendum Note (Signed)
 Addended by: DARRELL BRUCKNER on: 03/02/2024 11:03 AM   Modules accepted: Orders

## 2024-03-03 ENCOUNTER — Telehealth: Payer: Self-pay | Admitting: Licensed Clinical Social Worker

## 2024-03-03 NOTE — Telephone Encounter (Signed)
 H&V Care Navigation CSW Progress Note  Clinical Social Worker contacted patient by phone to f/u on assistance applications. LCSW had advised pt they were sent and he was going to check his mail and f/u back up with me for assistance. I have received no return call still today. No answer again today at [phone]. Left additional f/u message. Will re-attempt again as able.    Patient is participating in a Managed Medicaid Plan:  No  SDOH Screenings   Food Insecurity: Food Insecurity Present (02/10/2024)  Housing: Low Risk  (02/10/2024)  Recent Concern: Housing - High Risk (12/11/2023)  Transportation Needs: No Transportation Needs (02/10/2024)  Utilities: Not At Risk (02/10/2024)  Alcohol Screen: Low Risk  (12/11/2023)  Depression (PHQ2-9): Medium Risk (02/11/2024)  Financial Resource Strain: High Risk (02/10/2024)  Physical Activity: Insufficiently Active (12/11/2023)  Social Connections: Socially Isolated (12/11/2023)  Stress: Stress Concern Present (12/11/2023)  Tobacco Use: Low Risk  (02/15/2024)  Health Literacy: Adequate Health Literacy (02/10/2024)    Marit Lark, MSW, LCSW Clinical Social Worker II [facility] Health Heart/Vascular Care Navigation  [phone]- work cell phone (preferred)

## 2024-03-05 ENCOUNTER — Telehealth (HOSPITAL_BASED_OUTPATIENT_CLINIC_OR_DEPARTMENT_OTHER): Payer: Self-pay | Admitting: Licensed Clinical Social Worker

## 2024-03-05 ENCOUNTER — Other Ambulatory Visit: Payer: Self-pay | Admitting: Pharmacist

## 2024-03-05 NOTE — Telephone Encounter (Signed)
 H&V Care Navigation CSW Progress Note  Clinical Social Worker contacted patient by phone to f/u on assistance applications. I have received no return call still today. No answer again today at [phone]. At this time I have left three voicemails for pt, remain available should he re-connect with care navigation/return call.    Patient is participating in a Managed Medicaid Plan:  no, self pay only  SDOH Screenings   Food Insecurity: Food Insecurity Present (02/10/2024)  Housing: Low Risk  (02/10/2024)  Recent Concern: Housing - High Risk (12/11/2023)  Transportation Needs: No Transportation Needs (02/10/2024)  Utilities: Not At Risk (02/10/2024)  Alcohol Screen: Low Risk  (12/11/2023)  Depression (PHQ2-9): Medium Risk (02/11/2024)  Financial Resource Strain: High Risk (02/10/2024)  Physical Activity: Insufficiently Active (12/11/2023)  Social Connections: Socially Isolated (12/11/2023)  Stress: Stress Concern Present (12/11/2023)  Tobacco Use: Low Risk  (02/15/2024)  Health Literacy: Adequate Health Literacy (02/10/2024)     Marit Lark, MSW, LCSW Clinical Social Worker II [facility] Health Heart/Vascular Care Navigation  [phone]- work cell phone (preferred)

## 2024-03-05 NOTE — Progress Notes (Signed)
 03/05/2024 Name: Gary Conway MRN: [id number] DOB: 1975/11/17  Chief Complaint  Patient presents with   Medication Management    Gary Conway is a 48 y.o. year old male who presented for a telephone visit.   They were referred to the pharmacist by their PCP for assistance in managing diabetes and medication access.    Subjective:  Care Team: Primary Care Provider: Lucius Krabbe, NP ; Next Scheduled Visit: 04/2024 Cardiology Pharmacist for CHF - initial appointment 03/26/2024  Medication Access/Adherence  Current Pharmacy:  [facility] [address] [place] [address] Phone: [phone] Fax: [phone]  [facility] [address] [place] [address] Phone: [phone] Fax: [phone]   Patient reports affordability concerns with their medications: Yes  - He does not currently have insurance but he has been using [facility] - tried to use medications on the $5 / $10 / $15 list.  Applied for Entresto  patient assistance program but paperwork not received until 02/26/2024 and as of 02/26/2024, [employer] removed Entresto  from patient assistance program. Entresto  has a generic available now - current cost per GoodRx is $50 / #60 or 1 month supply or about $42 thru [employer]. Patient reports access/transportation concerns to their pharmacy: No  Patient reports adherence concerns with their medications:  Yes  - has not started valsartan  yet but plans to pick up tomorrow when he gets paid again.     Diabetes:  Current medications: metformin  ER 500mg  twice a day; Basaglar  26 units twice a day (using coupon to get Basaglar  at lower cost - $35 / month)    Current glucose readings: 120 to 140's     Patient denies hypoglycemic s/sx including no dizziness, shakiness, sweating. Patient denies hyperglycemic symptoms including no polyuria,  polydipsia, polyphagia, nocturia, neuropathy, blurred vision.   Current medication access support: coupon for Basaglar   Macrovascular and Microvascular Risk Reduction:  Statin? no; no history of statin therapy;He is taking fenofibrate  due to triglycerides > 500.  ACEi/ARB? Entresto  changed to valsartan  160mg  daily due to cost but patient has not started valsartan  yet - plans to pick up tomorrow  Last urinary albumin /creatinine ratio:  Lab Results  Component Value Date   MICRALBCREAT 41.7 (H) 12/12/2023   Last eye exam:   Last foot exam: No foot exam found Tobacco Use:  Tobacco Use: Low Risk  (02/15/2024)   Patient History    Smoking Tobacco Use: Never    Smokeless Tobacco Use: Never    Passive Exposure: Not on file   Heart Failure with preserved EF  (EF 55-60%):  Current medications:  ACEi/ARB/ARNI: valsartan  160mg  daily  SGLT2i: no Beta blocker: yes - metoprolol  ER 50mg  each morning Mineralocorticoid Receptor Antagonist: yes - spironolactone  25mg  0.5 tablet daily  Diuretic regimen: furosemide  40mg  daily   BP Readings from Last 3 Encounters:  02/11/24 (!) 147/105  02/05/24 119/83  01/14/24 (!) 133/94    Patient denies volume overload signs or symptoms including no shortness of breath, lower extremity edema, increased use of pillows at night He does report that he has difficulty keeping up with demands at work. He works on a [employer].    Objective:  Lab Results  Component Value Date   HGBA1C 8.9 (A) 11/08/2023    Lab Results  Component Value Date   CREATININE 1.80 (H) 12/13/2023   BUN 28 (H) 12/13/2023   NA 128 (L) 12/13/2023   K 4.8  12/13/2023   CL 92 (L) 12/13/2023   CO2 23 12/13/2023    Lab Results  Component Value Date   CHOL 208 (H) 12/12/2023   HDL 32.20 (L) 12/12/2023   LDLDIRECT 42.0 12/12/2023   TRIG (H) 12/12/2023    542.0 Triglyceride is over 400; calculations on Lipids are invalid.   CHOLHDL 6 12/12/2023    Medications Reviewed  Today     Reviewed by Carla Milling, RPH-CPP (Pharmacist) on 03/05/24 at 1622  Med List Status: <None>   Medication Order Taking? Sig Documenting Provider Last Dose Status Informant  albuterol  (VENTOLIN  HFA) 108 (90 Base) MCG/ACT inhaler [id number]  Inhale 2 puffs into the lungs every 6 (six) hours as needed for wheezing or shortness of breath. Lucius Krabbe, NP  Active   blood glucose meter kit and supplies KIT [id number]  Dispense based on patient and insurance preference. Use up to four times daily as directed. (FOR ICD-9 250.00, 250.01). For QAC - HS accuchecks. Singh, Prashant K, MD  Active Self, Pharmacy Records  Blood Glucose Monitoring Suppl (BLOOD GLUCOSE MONITOR SYSTEM) w/Device KIT [id number]  Use to test blood sugar in the morning, at noon, and at bedtime. Singh, Prashant K, MD  Active Self, Pharmacy Records  cyanocobalamin  (VITAMIN B12) 1000 MCG tablet [id number]  Take 1 tablet (1,000 mcg total) by mouth daily. Lucius Krabbe, NP  Active   fenofibrate  (TRICOR ) 145 MG tablet [id number]  Take 1 tablet (145 mg total) by mouth daily. Lucius Krabbe, NP  Active   ferrous sulfate  325 (65 FE) MG EC tablet [id number]  Take 1 tablet (325 mg total) by mouth daily with breakfast. Lucius Krabbe, NP  Active   folic acid  (FOLVITE ) 1 MG tablet [id number]  Take 1 tablet (1 mg total) by mouth daily. Lucius Krabbe, NP  Active   furosemide  (LASIX ) 40 MG tablet [id number]  Take 1 tablet (40 mg total) by mouth every morning. Lucius Krabbe, NP  Active   Insulin  Glargine (BASAGLAR  KWIKPEN) 100 UNIT/ML [id number]  Inject 26 Units into the skin 2 (two) times daily. Lucius Krabbe, NP  Active   Insulin  Pen Needle 32G X 4 MM MISC [id number]  For 4 times a day insulin  Subcutaneously. Singh, Prashant K, MD  Active Self, Pharmacy Records  metFORMIN  (GLUCOPHAGE -XR) 500 MG 24 hr tablet [id number]  Take 1 tablet (500 mg total) by mouth 2 (two) times daily. Lucius Krabbe, NP  Active   metoprolol   succinate (TOPROL -XL) 50 MG 24 hr tablet [id number]  Take 1 tablet (50 mg total) by mouth every morning. Tolia, Sunit, DO  Active   mometasone -formoterol  (DULERA ) 200-5 MCG/ACT AERO [id number]  Inhale 2 puffs into the lungs 2 (two) times daily. Elgergawy, Brayton RAMAN, MD  Active   spironolactone  (ALDACTONE ) 25 MG tablet [id number]  Take 0.5 tablets (12.5 mg total) by mouth daily. Lucius Krabbe, NP  Active   valsartan  (DIOVAN ) 160 MG tablet [id number]  Take 1 tablet (160 mg total) by mouth daily. Michele, Sunit, DO  Active               Assessment/Plan:   Diabetes: - Currently uncontrolled per lst A1c but home blood glucose readings are improving; goal A1c <7%. Cardiorenal risk reduction is opportunities for improvement.. Blood pressure is not at goal <130/80. LDL is at goal but triglycerides are very high - Reviewed goal A1c, goal fasting, and goal 2 hour post prandial glucose. Recommended to check glucose 1 to 2 times per day ar varying times of day.  -  Recommend to continue Basaglar  and metformin  at current doses. SABRA  Heart Failure / HTN - Currently not taking ARB but plans to start once he pick up Rx tomorrow.   - Continue metoprolol  ER 50mg  daily, spironolacton 25mg  - take 0.5 tablet daily and furosemide  40mg  daily  - Start valsartan  160mg  daily     Follow Up Plan: 2 to 3 weeks   Madelin Ray, PharmD Clinical Pharmacist [facility]  Population Health [phone]

## 2024-03-10 ENCOUNTER — Telehealth: Payer: Self-pay | Admitting: Licensed Clinical Social Worker

## 2024-03-10 ENCOUNTER — Telehealth: Payer: Self-pay | Admitting: Pharmacist

## 2024-03-10 NOTE — Telephone Encounter (Signed)
 H&V Care Navigation CSW Progress Note  Clinical Social Worker received call from pt who shares he picked up his valsartan  and wants to make sure he knows what to stop taking. I shared I would need to reach out to nurse or pharmacy team regarding recommendations to ensure safety.   Inquired if he has received the applications I sent to him in mail. Pt thinks so but is distracted at work. Encouraged him to call me when he has applications in front of him and I will be happy to help him work through them.  Patient is participating in a Managed Medicaid Plan:  No, self pay only  SDOH Screenings   Food Insecurity: Food Insecurity Present (02/10/2024)  Housing: Low Risk  (02/10/2024)  Recent Concern: Housing - High Risk (12/11/2023)  Transportation Needs: No Transportation Needs (02/10/2024)  Utilities: Not At Risk (02/10/2024)  Alcohol Screen: Low Risk  (12/11/2023)  Depression (PHQ2-9): Medium Risk (02/11/2024)  Financial Resource Strain: High Risk (02/10/2024)  Physical Activity: Insufficiently Active (12/11/2023)  Social Connections: Socially Isolated (12/11/2023)  Stress: Stress Concern Present (12/11/2023)  Tobacco Use: Low Risk  (02/15/2024)  Health Literacy: Adequate Health Literacy (02/10/2024)    Marit Lark, MSW, LCSW Clinical Social Worker [facility] Health Heart/Vascular Care Navigation  [phone]- work cell phone (preferred)

## 2024-03-10 NOTE — Telephone Encounter (Signed)
 Received the following request from Marit Lark, LCSW  Morning! Gary Conway just picked up his valsartan  and just wanted to make sure he understood what he was taking it in replacement for.  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  Tried to reach Gary Conway but got his voicemail. Left a message to that indeed valsartan  would replace his Entresto   (he has not filled Entresto  since 10/11/2023 so unless he has been given samples should not have any on hand).

## 2024-03-11 ENCOUNTER — Telehealth: Payer: Self-pay

## 2024-03-11 NOTE — Telephone Encounter (Signed)
 Copied from CRM [id number]. Topic: General - Other >> Mar 10, 2024  2:39 PM Frederich PARAS wrote: Reason for CRM: pt wants to know if he can get a drs notes in regards to letting his job know what he can and can not do. Please reach out to pt in regards to this.

## 2024-03-12 NOTE — Telephone Encounter (Signed)
 Has he asked his work if they will accept a note with restrictions? needs to do this first as he may need FMLA paperwork instead which allows him to miss days from work (up to 3 per month due to illness).

## 2024-03-13 NOTE — Telephone Encounter (Signed)
 I called pt, Pt states his work will accept a letter with restrictions. 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 Patient would like letter by Monday sent via MyChart. Please advise.

## 2024-03-17 ENCOUNTER — Encounter: Payer: Self-pay | Admitting: Family

## 2024-03-17 NOTE — Telephone Encounter (Signed)
 letter sent via MyChart.

## 2024-03-17 NOTE — Telephone Encounter (Signed)
 I reached out to pt, pt states he has received letter.

## 2024-03-19 NOTE — Telephone Encounter (Signed)
 Patient never returned PAP application or any response to phone calls or messages for Pap application for Entresto - not covered for 2026

## 2024-03-19 NOTE — Telephone Encounter (Signed)
 Patient has not responded to any phone call or messages for PAP application.

## 2024-03-23 ENCOUNTER — Telehealth: Payer: Self-pay | Admitting: Licensed Clinical Social Worker

## 2024-03-23 NOTE — Telephone Encounter (Signed)
 H&V Care Navigation CSW Progress Note  Clinical Social Worker contacted patient by phone to f/u on patient assistance forms sent to him. No answer today at [phone], left voicemail. Have attempted on 9/22, 9/26, 10/7, 10/9 and 10/14 to assist pt with completing these forms. Remain available should pt re-engage with care navigation team.  Patient is participating in a Managed Medicaid Plan:  No, self pay only  SDOH Screenings   Food Insecurity: Food Insecurity Present (02/10/2024)  Housing: Low Risk  (02/10/2024)  Recent Concern: Housing - High Risk (12/11/2023)  Transportation Needs: No Transportation Needs (02/10/2024)  Utilities: Not At Risk (02/10/2024)  Alcohol Screen: Low Risk  (12/11/2023)  Depression (PHQ2-9): Medium Risk (02/11/2024)  Financial Resource Strain: High Risk (02/10/2024)  Physical Activity: Insufficiently Active (12/11/2023)  Social Connections: Socially Isolated (12/11/2023)  Stress: Stress Concern Present (12/11/2023)  Tobacco Use: Low Risk  (02/15/2024)  Health Literacy: Adequate Health Literacy (02/10/2024)    Marit Lark, MSW, LCSW Clinical Social Worker [facility] Health Heart/Vascular Care Navigation  [phone]- work cell phone (preferred)

## 2024-03-26 ENCOUNTER — Encounter: Payer: Self-pay | Admitting: Pharmacist

## 2024-03-26 ENCOUNTER — Ambulatory Visit: Payer: MEDICAID | Attending: Internal Medicine | Admitting: Pharmacist

## 2024-03-26 VITALS — BP 124/86 | HR 82 | Ht 68.0 in | Wt 200.4 lb

## 2024-03-26 DIAGNOSIS — I5032 Chronic diastolic (congestive) heart failure: Secondary | ICD-10-CM

## 2024-03-26 DIAGNOSIS — E1165 Type 2 diabetes mellitus with hyperglycemia: Secondary | ICD-10-CM

## 2024-03-26 MED ORDER — SPIRONOLACTONE 25 MG PO TABS: 25.0000 mg | ORAL_TABLET | Freq: Every day | ORAL | Status: DC

## 2024-03-26 NOTE — Patient Instructions (Addendum)
 It was nice meeting you today  We would like your blood pressure to be less than 130/80  Please continue: Spironolactone  25mg  daily Valsartan  160mg  daily Metoprolol  50mg  daily Furosemide  40mg  daily  The medication we discussed today is called Farxiga 10mg . You would take 1 tablet daily in the morning  I will submit the patient assistance application and contact you with the result  Chris Yaron Grasse, PharmD, BCACP, CDCES, CPP [facility] [address] [place], [place] [address] Phone: [phone]; Fax: [phone] 03/26/2024 10:22 AM

## 2024-03-26 NOTE — Progress Notes (Signed)
 Patient ID: Gary Conway                 DOB: 03/25/1976                      MRN: [id number]     HPI: Gary Conway is a 48 y.o. male referred by Dr. Michele to pharmacy clinic for HF medication management. PMH is significant for CHF, T2DM, and HTN. Most recent LVEF 55-60% on 07/30/23.  Discussion with patient today included the following: cardiac medication indications, review of GDMT including reasoning behind medication titration, importance of medication adherence, and patient engagement.   Patient denies dizziness or reduced appetitie. Endorses SOB with exertion, occasional chest pain, and LE edema when standing. Frequently needs to move while at work due to swelling in feet.  Patient reports compliance with medications. Was prescribed spironolactone  12.5mg  but he did not realize this and has been taking the 25mg  tablet. Updated med list.  Reports no adverse effects. Of note, last Scr 1.80 on 12/13/23.  Unable to afford Entresto  so was switched to valsartan  160mg  daily. Takes furosemide  in afternoon after work due to frequent urination.  Patient does not use tobacco or drink alcohol.  SGLT2i had previously been held due to history of DKA. However he is now on metformin  and Basaglar  with close management from PCP.  Current CHF meds:  Furosemide  40mg  daily Spironolactone  25mg  daily Metoprolol  50mg  daily Valsartan  160mg  daily  BP goal: <130/80   Wt Readings from Last 3 Encounters:  02/11/24 195 lb 3.2 oz (88.5 kg)  02/05/24 194 lb 3.2 oz (88.1 kg)  01/14/24 191 lb 4 oz (86.8 kg)   BP Readings from Last 3 Encounters:  02/11/24 (!) 147/105  02/05/24 119/83  01/14/24 (!) 133/94   Pulse Readings from Last 3 Encounters:  02/11/24 88  02/05/24 (!) 110  01/14/24 (!) 102    Renal function: CrCl cannot be calculated (Patient's most recent lab result is older than the maximum 21 days allowed.).  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   Body mass index (BMI) 30.0-30.9, adult 12/12/2023   CAP (community acquired pneumonia) 03/22/2023   Diabetes mellitus, type II (HCC) 2015   Diagnosed during 2015 admission to [facility]   Diabetic ketoacidosis without coma associated with type 2 diabetes mellitus (HCC) 09/13/2019   Diffuse 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   Hemangioma of spine 2015   S/P excision at [facility] with pathology favoring benign Hemangioma   Influenza A 06/17/2023   Multifocal pneumonia 07/29/2023   Numbness and tingling of both legs 03/22/2014   Obesity 12/12/2023   Paraplegia (HCC) 03/22/2014   Severe sepsis (HCC) 03/22/2023   Spinal hemangioma 03/23/2014   Thoracic myelopathy 03/22/2014    Current Outpatient Medications on File Prior to Visit  Medication Sig Dispense Refill   albuterol  (VENTOLIN  HFA) 108 (90 Base) MCG/ACT inhaler Inhale 2 puffs into the lungs every 6 (six) hours as needed for wheezing or shortness of breath. 6.7 g 5   blood glucose meter kit and supplies KIT Dispense based on patient and insurance preference. Use up to four times daily as directed. (FOR ICD-9  250.00, 250.01). For QAC - HS accuchecks. 1 each 1   Blood Glucose Monitoring Suppl (BLOOD GLUCOSE MONITOR SYSTEM) w/Device KIT Use to test blood sugar in the morning, at noon, and at bedtime. 1 kit 0   cyanocobalamin  (VITAMIN B12) 1000 MCG tablet Take 1 tablet (1,000 mcg total) by mouth daily. 30 tablet 5   fenofibrate  (TRICOR ) 145 MG tablet Take 1 tablet (145 mg total) by mouth daily. 30 tablet 5   ferrous sulfate  325 (65 FE) MG EC tablet Take 1 tablet (325 mg total) by mouth daily with breakfast. 30  tablet 5   folic acid  (FOLVITE ) 1 MG tablet Take 1 tablet (1 mg total) by mouth daily. 30 tablet 11   furosemide  (LASIX ) 40 MG tablet Take 1 tablet (40 mg total) by mouth every morning. 30 tablet 5   Insulin  Glargine (BASAGLAR  KWIKPEN) 100 UNIT/ML Inject 26 Units into the skin 2 (two) times daily. 15 mL 5   Insulin  Pen Needle 32G X 4 MM MISC For 4 times a day insulin  Subcutaneously. 100 each 0   metFORMIN  (GLUCOPHAGE -XR) 500 MG 24 hr tablet Take 1 tablet (500 mg total) by mouth 2 (two) times daily. 30 tablet 5   metoprolol  succinate (TOPROL -XL) 50 MG 24 hr tablet Take 1 tablet (50 mg total) by mouth every morning. 90 tablet 3   mometasone -formoterol  (DULERA ) 200-5 MCG/ACT AERO Inhale 2 puffs into the lungs 2 (two) times daily. 13 g 0   spironolactone  (ALDACTONE ) 25 MG tablet Take 0.5 tablets (12.5 mg total) by mouth daily. 45 tablet 3   valsartan  (DIOVAN ) 160 MG tablet Take 1 tablet (160 mg total) by mouth daily. 30 tablet 5   No current facility-administered medications on file prior to visit.    Allergies  Allergen Reactions   Mustard Hives, Itching and Rash     Assessment/Plan:  1. CHF -  Patient BP in room 124/86 which is slightly above goal of <130/80. Patient almost maximized on GDMT. Would benefit from SGLT2i now that DM is being medically managed due to CHF, DM, and renal disease. Since patient unlikely to afford, will complete patient assistance application and send to manufacturer.    Continue spironolactone  25mg  daily Continue valsartan  160mg  daily Continue metoprolol  50mg  daily Start Farxiga 10mg  daily  Chris Suzanne Kho, PharmD, [place], CDCES, CPP [facility] [address] [place], [place] [address] Phone: [phone]; Fax: [phone] 03/26/2024 1:46 PM

## 2024-04-02 ENCOUNTER — Other Ambulatory Visit: Payer: Self-pay | Admitting: Pharmacist

## 2024-04-02 NOTE — Progress Notes (Signed)
 04/02/2024 Name: Gary Conway MRN: [id number] DOB: 12-17-1975  Chief Complaint  Patient presents with   Medication Management   Diabetes    Gary Conway is a 48 y.o. year old male who presented for a telephone visit.   They were referred to the pharmacist by their PCP for assistance in managing diabetes and medication access.    Subjective:  Care Team: Primary Care Provider: Lucius Krabbe, NP ; Next Scheduled Visit: 04/2024  Medication Access/Adherence  Current Pharmacy:  [facility] [address] [place] [address] Phone: [phone] Fax: [phone]  [facility] [address] [place] [address] Phone: [phone] Fax: [phone]   Patient reports affordability concerns with their medications: Yes  - He does not currently have insurance but he has been using [facility]. We try to use medications on the $5 / $10 / $15 list.  Patient reports access/transportation concerns to their pharmacy: No  Patient reports adherence concerns with their medications:  No       Diabetes:  Current medications: metformin  ER 500mg  twice a day; Basaglar  26 units twice a day (using coupon to get Basaglar  at lower cost - $35 / month)   Current glucose readings: was 130 this morning. Denies blood glucose in the 200's. Denies low blood glucose / < 70.  Report blood glucose usually 120 to 170.   Patient denies hypoglycemic s/sx including no dizziness, shakiness, sweating. Patient denies hyperglycemic symptoms including no polyuria, polydipsia, polyphagia, nocturia, neuropathy, blurred vision.   Current medication access support: coupon for Basaglar   Macrovascular and Microvascular Risk Reduction:  Statin? no; no history of statin therapy;He is taking fenofibrate  due to triglycerides > 500.  ACEi/ARB? Valsartan  160mg  daily  Last urinary albumin /creatinine ratio:  Lab  Results  Component Value Date   MICRALBCREAT 41.7 (H) 12/12/2023   Last eye exam:   Last foot exam: No foot exam found Tobacco Use:  Tobacco Use: Low Risk  (03/26/2024)   Patient History    Smoking Tobacco Use: Never    Smokeless Tobacco Use: Never    Passive Exposure: Not on file   Heart Failure with preserved EF  (EF 55-60%):  Current medications:  ACEi/ARB/ARNI: valsartan  160mg  daily  SGLT2i: no but he met with cardio clinical pharmacist 10/33/2025. 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 Had been cautious about starting SGLT2 in past when blood glucose was uncontrolled / history of DKA.  Beta blocker: yes - metoprolol  ER 50mg  each morning Mineralocorticoid Receptor Antagonist: yes - spironolactone  25mg  daily  Diuretic regimen: furosemide  40mg  daily   BP Readings from Last 3 Encounters:  03/26/24 124/86  02/11/24 (!) 147/105  02/05/24 119/83    Patient denies volume overload signs or symptoms including no shortness of breath, lower extremity edema, increased use of pillows at night He report he has been getting shortness of breath at work less recently; better able to meet physical demands of his job.    Objective:  Lab Results  Component Value Date   HGBA1C 8.9 (A) 11/08/2023    Lab Results  Component Value Date   CREATININE 1.80 (H) 12/13/2023   BUN 28 (H) 12/13/2023   NA 128 (L) 12/13/2023   K 4.8 12/13/2023   CL 92 (L) 12/13/2023   CO2 23 12/13/2023    Lab Results  Component Value Date   CHOL 208 (H) 12/12/2023   HDL 32.20 (L) 12/12/2023  LDLDIRECT 42.0 12/12/2023   TRIG (H) 12/12/2023    542.0 Triglyceride is over 400; calculations on Lipids are invalid.   CHOLHDL 6 12/12/2023    Medications Reviewed Today     Reviewed by Gary Conway (Pharmacist) on 04/02/24 at 1601  Med List Status: <None>   Medication Order Taking? Sig Documenting Provider Last Dose Status Informant  albuterol  (VENTOLIN  HFA)  108 (90 Base) MCG/ACT inhaler [id number]  Inhale 2 puffs into the lungs every 6 (six) hours as needed for wheezing or shortness of breath. Gary Krabbe, NP  Active   blood glucose meter kit and supplies KIT [id number]  Dispense based on patient and insurance preference. Use up to four times daily as directed. (FOR ICD-9 250.00, 250.01). For QAC - HS accuchecks. Gary Conway  Active Self, Pharmacy Records  Blood Glucose Monitoring Suppl (BLOOD GLUCOSE MONITOR SYSTEM) w/Device KIT [id number]  Use to test blood sugar in the morning, at noon, and at bedtime. Gary Conway  Active Self, Pharmacy Records  cyanocobalamin  (VITAMIN B12) 1000 MCG tablet [id number] Yes Take 1 tablet (1,000 mcg total) by mouth daily. Gary Krabbe, NP  Active   fenofibrate  (TRICOR ) 145 MG tablet [id number] Yes Take 1 tablet (145 mg total) by mouth daily. Gary Krabbe, NP  Active   ferrous sulfate  325 (65 FE) MG EC tablet [id number]  Take 1 tablet (325 mg total) by mouth daily with breakfast. Gary Krabbe, NP  Active   folic acid  (FOLVITE ) 1 MG tablet [id number]  Take 1 tablet (1 mg total) by mouth daily. Gary Krabbe, NP  Active   furosemide  (LASIX ) 40 MG tablet [id number] Yes Take 1 tablet (40 mg total) by mouth every morning.  Patient taking differently: Take 40 mg by mouth daily in the afternoon. In the afternoon   Gary Krabbe, NP  Active   Insulin  Glargine (BASAGLAR  KWIKPEN) 100 UNIT/ML [id number] Yes Inject 26 Units into the skin 2 (two) times daily. Gary Krabbe, NP  Active   Insulin  Pen Needle 32G X 4 MM MISC [id number]  For 4 times a day insulin  Subcutaneously. Gary Conway  Active Self, Pharmacy Records  metFORMIN  (GLUCOPHAGE -XR) 500 MG 24 hr tablet [id number] Yes Take 1 tablet (500 mg total) by mouth 2 (two) times daily. Gary Krabbe, NP  Active   metoprolol  succinate (TOPROL -XL) 50 MG 24 hr tablet [id number]  Take 1 tablet (50 mg total) by mouth every morning.  Tolia, Sunit, DO  Active   mometasone -formoterol  (DULERA ) 200-5 MCG/ACT AERO [id number]  Inhale 2 puffs into the lungs 2 (two) times daily.  Patient not taking: Reported on 04/02/2024   Gary Conway  Active   spironolactone  (ALDACTONE ) 25 MG tablet [id number] Yes Take 1 tablet (25 mg total) by mouth daily. Tolia, Sunit, DO  Active   valsartan  (DIOVAN ) 160 MG tablet [id number] Yes Take 1 tablet (160 mg total) by mouth daily. Michele, Sunit, DO  Active               Assessment/Plan:   Diabetes: - Currently uncontrolled per lst A1c but home blood glucose readings are improving; goal A1c <7%. Cardiorenal risk reduction is opportunities for improvement.. Blood pressure is not at goal <130/80. LDL is at goal but triglycerides are very high - Reviewed goal A1c, goal fasting, and goal 2 hour post prandial glucose. Recommended to check glucose 1 to 2 times per day ar varying times of day.  - Recommend to continue Basaglar  and metformin  at current  doses. . - Consider starting statin therapy in place or along with fenofibrate  depending on triglycerides when rechecked in May 12, 2024. / next PCP appointment - placed note in office visit notes.   Heart Failure / HTN - continue valsartan  160mg  daily  - Continue metoprolol  ER 50mg  daily, spironolacton 25mg  daily and furosemide  40mg  daily    Follow Up Plan: 2 to 3 months.    Madelin Ray, PharmD Clinical Pharmacist [facility]  Population Health [phone]

## 2024-04-07 ENCOUNTER — Telehealth: Payer: Self-pay | Admitting: Licensed Clinical Social Worker

## 2024-04-07 NOTE — Telephone Encounter (Signed)
 H&V Care Navigation CSW Progress Note  Clinical Social Worker reviewed chart to see if any additional f/u for patient assistance forms sent has been made. I have not received any further f/u from pt. Have attempted on 9/22, 9/26, 10/7, 10/9 and 10/14, 10/27 to assist pt with completing these forms. Remain available should pt re-engage with care navigation team.   Patient is participating in a Managed Medicaid Plan:  No, self pay only  SDOH Screenings   Food Insecurity: Food Insecurity Present (02/10/2024)  Housing: Low Risk  (02/10/2024)  Recent Concern: Housing - High Risk (12/11/2023)  Transportation Needs: No Transportation Needs (02/10/2024)  Utilities: Not At Risk (02/10/2024)  Alcohol Screen: Low Risk  (12/11/2023)  Depression (PHQ2-9): Medium Risk (02/11/2024)  Financial Resource Strain: High Risk (02/10/2024)  Physical Activity: Insufficiently Active (12/11/2023)  Social Connections: Socially Isolated (12/11/2023)  Stress: Stress Concern Present (12/11/2023)  Tobacco Use: Low Risk  (03/26/2024)  Health Literacy: Adequate Health Literacy (02/10/2024)    Gary Conway, MSW, LCSW Clinical Social Worker II [facility] Heart/Vascular Care Navigation  [phone]- work cell phone (preferred)

## 2024-04-13 ENCOUNTER — Other Ambulatory Visit (HOSPITAL_COMMUNITY): Payer: Self-pay

## 2024-05-04 ENCOUNTER — Other Ambulatory Visit (HOSPITAL_COMMUNITY): Payer: Self-pay

## 2024-05-04 ENCOUNTER — Other Ambulatory Visit: Payer: Self-pay

## 2024-05-12 ENCOUNTER — Ambulatory Visit: Payer: Self-pay | Admitting: Family

## 2024-05-14 ENCOUNTER — Ambulatory Visit: Payer: Self-pay | Admitting: Family

## 2024-05-20 ENCOUNTER — Encounter: Payer: Self-pay | Admitting: Family

## 2024-05-20 ENCOUNTER — Ambulatory Visit (INDEPENDENT_AMBULATORY_CARE_PROVIDER_SITE_OTHER): Payer: Self-pay | Admitting: Family

## 2024-05-20 ENCOUNTER — Other Ambulatory Visit (HOSPITAL_COMMUNITY): Payer: Self-pay

## 2024-05-20 VITALS — BP 162/110 | HR 97 | Temp 97.6°F | Ht 68.0 in | Wt 205.1 lb

## 2024-05-20 DIAGNOSIS — E781 Pure hyperglyceridemia: Secondary | ICD-10-CM

## 2024-05-20 DIAGNOSIS — D508 Other iron deficiency anemias: Secondary | ICD-10-CM

## 2024-05-20 DIAGNOSIS — Z59868 Other specified financial insecurity: Secondary | ICD-10-CM

## 2024-05-20 DIAGNOSIS — E669 Obesity, unspecified: Secondary | ICD-10-CM

## 2024-05-20 DIAGNOSIS — E785 Hyperlipidemia, unspecified: Secondary | ICD-10-CM

## 2024-05-20 DIAGNOSIS — I5032 Chronic diastolic (congestive) heart failure: Secondary | ICD-10-CM

## 2024-05-20 DIAGNOSIS — N183 Chronic kidney disease, stage 3 unspecified: Secondary | ICD-10-CM

## 2024-05-20 DIAGNOSIS — E1159 Type 2 diabetes mellitus with other circulatory complications: Secondary | ICD-10-CM

## 2024-05-20 DIAGNOSIS — Z794 Long term (current) use of insulin: Secondary | ICD-10-CM

## 2024-05-20 DIAGNOSIS — E1169 Type 2 diabetes mellitus with other specified complication: Secondary | ICD-10-CM

## 2024-05-20 DIAGNOSIS — I152 Hypertension secondary to endocrine disorders: Secondary | ICD-10-CM

## 2024-05-20 DIAGNOSIS — E1165 Type 2 diabetes mellitus with hyperglycemia: Secondary | ICD-10-CM

## 2024-05-20 DIAGNOSIS — Z7985 Long-term (current) use of injectable non-insulin antidiabetic drugs: Secondary | ICD-10-CM

## 2024-05-20 LAB — POCT GLYCOSYLATED HEMOGLOBIN (HGB A1C): Hemoglobin A1C: 12.8 % — AB (ref 4.0–5.6)

## 2024-05-20 MED ORDER — AMLODIPINE BESYLATE 5 MG PO TABS
5.0000 mg | ORAL_TABLET | Freq: Every evening | ORAL | 5 refills | Status: AC
  Filled 2024-05-20: qty 30, 30d supply, fill #0

## 2024-05-20 MED ORDER — VALSARTAN 160 MG PO TABS
160.0000 mg | ORAL_TABLET | Freq: Every day | ORAL | 5 refills | Status: AC
  Filled 2024-05-20: qty 30, 30d supply, fill #0

## 2024-05-20 MED ORDER — CLONIDINE HCL 0.1 MG PO TABS
0.1000 mg | ORAL_TABLET | Freq: Once | ORAL | Status: AC
  Administered 2024-05-20: 0.1 mg via ORAL

## 2024-05-20 MED ORDER — FENOFIBRATE 145 MG PO TABS
145.0000 mg | ORAL_TABLET | Freq: Every day | ORAL | 5 refills | Status: AC
  Filled 2024-05-20: qty 30, 30d supply, fill #0

## 2024-05-20 MED ORDER — BASAGLAR KWIKPEN 100 UNIT/ML ~~LOC~~ SOPN
32.0000 [IU] | PEN_INJECTOR | Freq: Two times a day (BID) | SUBCUTANEOUS | 5 refills | Status: AC
  Filled 2024-05-20: qty 15, 23d supply, fill #0

## 2024-05-20 MED ORDER — INSULIN PEN NEEDLE 32G X 4 MM MISC
0 refills | Status: DC
  Filled 2024-05-20: qty 100, 25d supply, fill #0

## 2024-05-20 MED ORDER — SPIRONOLACTONE 25 MG PO TABS
25.0000 mg | ORAL_TABLET | Freq: Every day | ORAL | 5 refills | Status: AC
  Filled 2024-05-20: qty 30, 30d supply, fill #0

## 2024-05-20 MED ORDER — FERROUS SULFATE 325 (65 FE) MG PO TBEC
325.0000 mg | DELAYED_RELEASE_TABLET | Freq: Every day | ORAL | 5 refills | Status: AC
  Filled 2024-05-20: qty 30, 30d supply, fill #0

## 2024-05-20 NOTE — Progress Notes (Unsigned)
 "  Patient ID: Gary Conway, male    DOB: Oct 01, 1975, 48 y.o.   MRN: [id number]  Chief Complaint  Patient presents with   Hypertension associated with type 2 diabetes mellitus    Follow up   Type 2 diabetes mellitus with obesity  Discussed the use of AI scribe software for clinical note transcription with the patient, who gave verbal consent to proceed.  History of Present Illness Gary Conway is a 48 year old male with diabetes and hypertension who presents for management of elevated blood sugar and blood pressure.  He reports significantly elevated blood sugars, with a recent A1c of 12%. He usually takes 26 units of insulin  twice daily but only took 17 units this morning because he is running out of insulin . He checks his glucose at home and notes fasting and lunchtime values in the low 100s. He eats both white and whole grain bread and is also taking metformin .  He treats hypertension with valsartan , spironolactone , and metoprolol . Amlodipine  was stopped previously due to low blood pressure. A cardiologist started valsartan  in October.  He is working with a child psychotherapist to obtain Medicaid and improve medication access and with a pharmacist on insurance and prescriptions, but he has had difficulty reaching him reliably. He uses a probation officer on [employer] for his medications.  Assessment & Plan Type 2 diabetes mellitus with hypertension and chronic kidney disease stage 3 A1c at 12 indicates poor glycemic control. High blood pressure risks kidney damage and stroke. Insufficient insulin  regimen. Metformin  adjustment limited by GFR 46. - Increased Basaglar  insulin  to 32 units twice a day. - Instructed to monitor blood glucose levels before breakfast and around supper. - Ordered metabolic panel to assess kidney function. - Encouraged reduction of white carbohydrates and increased water intake. - Administered clonidine  for acute hypertension. - Added amlodipine  at bedtime for  hypertension. - Coordinated with social worker for medication assistance and Medicaid enrollment.  Chronic diastolic heart failure Blood pressure management is critical to prevent heart failure exacerbation. Current medications include valsartan , spironolactone , and metoprolol . Entresto  pending approval. - Continue valsartan , spironolactone , and metoprolol . - Attempt to obtain Entresto  for heart failure management.  Iron deficiency anemia Continued management with iron supplementation. - Continue daily iron supplementation.  Patient cannot afford medications Social worker involved for assistance with medication access and Medicaid enrollment. - Coordinated with child psychotherapist for medication assistance and Medicaid enrollment. - Encouraged communication with social worker to complete necessary forms.   Subjective:    Outpatient Medications Prior to Visit  Medication Sig Dispense Refill   albuterol  (VENTOLIN  HFA) 108 (90 Base) MCG/ACT inhaler Inhale 2 puffs into the lungs every 6 (six) hours as needed for wheezing or shortness of breath. 6.7 g 5   blood glucose meter kit and supplies KIT Dispense based on patient and insurance preference. Use up to four times daily as directed. (FOR ICD-9 250.00, 250.01). For QAC - HS accuchecks. 1 each 1   Blood Glucose Monitoring Suppl (BLOOD GLUCOSE MONITOR SYSTEM) w/Device KIT Use to test blood sugar in the morning, at noon, and at bedtime. 1 kit 0   cyanocobalamin  (VITAMIN B12) 1000 MCG tablet Take 1 tablet (1,000 mcg total) by mouth daily. 30 tablet 5   fenofibrate  (TRICOR ) 145 MG tablet Take 1 tablet (145 mg total) by mouth daily. 30 tablet 5   ferrous sulfate  325 (65 FE) MG EC tablet Take 1 tablet (325 mg total) by mouth daily with breakfast. 30 tablet 5   folic acid  (  FOLVITE ) 1 MG tablet Take 1 tablet (1 mg total) by mouth daily. 30 tablet 11   furosemide  (LASIX ) 40 MG tablet Take 1 tablet (40 mg total) by mouth every morning. (Patient  taking differently: Take 40 mg by mouth daily in the afternoon. In the afternoon) 30 tablet 5   Insulin  Glargine (BASAGLAR  KWIKPEN) 100 UNIT/ML Inject 26 Units into the skin 2 (two) times daily. 15 mL 5   Insulin  Pen Needle 32G X 4 MM MISC For 4 times a day insulin  Subcutaneously. 100 each 0   metFORMIN  (GLUCOPHAGE -XR) 500 MG 24 hr tablet Take 1 tablet (500 mg total) by mouth 2 (two) times daily. 30 tablet 5   metoprolol  succinate (TOPROL -XL) 50 MG 24 hr tablet Take 1 tablet (50 mg total) by mouth every morning. 90 tablet 3   spironolactone  (ALDACTONE ) 25 MG tablet Take 1 tablet (25 mg total) by mouth daily.     valsartan  (DIOVAN ) 160 MG tablet Take 1 tablet (160 mg total) by mouth daily. 30 tablet 5   mometasone -formoterol  (DULERA ) 200-5 MCG/ACT AERO Inhale 2 puffs into the lungs 2 (two) times daily. (Patient not taking: Reported on 04/02/2024) 13 g 0   No facility-administered medications prior to visit.   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   Body mass index (BMI) 30.0-30.9, adult 12/12/2023   CAP (community acquired pneumonia) 03/22/2023   Diabetes mellitus, type II (HCC) 2015   Diagnosed during 2015 admission to [facility]   Diabetic ketoacidosis without coma associated with type 2 diabetes mellitus (HCC) 09/13/2019   Diffuse 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   Hemangioma of spine 2015   S/P excision at [facility] with pathology favoring benign Hemangioma   Influenza A 06/17/2023   Multifocal pneumonia 07/29/2023   Numbness and tingling of both legs 03/22/2014   Obesity 12/12/2023   Paraplegia  (HCC) 03/22/2014   Severe sepsis (HCC) 03/22/2023   Spinal hemangioma 03/23/2014   Thoracic myelopathy 03/22/2014   Past Surgical History:  Procedure Laterality Date   LAMINECTOMY N/A 03/22/2014   Procedure: THORACIC two - four  LAMINECTOMY FOR TUMOR;  Surgeon: Victory DELENA Gunnels, MD;  Location: [facility];  Service: Neurosurgery;  Laterality: N/A;  Throacic two  - four laminectomy for tumor   LEG SURGERY     Allergies[1]    Objective:    Physical Exam BP (!) 180/120 (BP Location: Left Arm, Patient Position: Sitting, Cuff Size: Large)   Pulse 97   Temp 97.6 F (36.4 C) (Temporal)   Ht 5' 8 (1.727 m)   Wt 205 lb 2 oz (93 kg)   SpO2 97%   BMI 31.19 kg/m  Wt Readings from Last 3 Encounters:  05/20/24 205 lb 2 oz (93 kg)  03/26/24 200 lb 6.4 oz (90.9 kg)  02/11/24 195 lb 3.2 oz (88.5 kg)       Forever Arechiga, NP       [1] Allergies Allergen Reactions   Mustard Hives, Itching and Rash  "

## 2024-05-20 NOTE — Patient Instructions (Addendum)
 It was very nice to see you today!   Your A1C is too high again up to 12.8! You have to get your blood sugar down.  Increase the insulin  to 32 units twice a day. Have to check a fasting sugar every morning - goal is < 130. Check it again before evening meal - goal is <180. Keep taking the Metformin . Your blood pressure is still too high also. Keep taking the Valsartan  and Metoprolol  in the morning. I have added Amlodipine  for the evenings.  Be sure to call all of pharmacists and social workers back that are trying to help you!  Schedule a 1 month follow up visit with me and bring in your glucometer and need to recheck your blood pressure.    PLEASE NOTE:  If you had any lab tests please let us  know if you have not heard back within a few days. You may see your results on MyChart before we have a chance to review them but we will give you a call once they are reviewed by us . If we ordered any referrals today, please let us  know if you have not heard from their office within the next week.

## 2024-05-22 ENCOUNTER — Other Ambulatory Visit (HOSPITAL_COMMUNITY): Payer: Self-pay

## 2024-05-22 DIAGNOSIS — E1165 Type 2 diabetes mellitus with hyperglycemia: Secondary | ICD-10-CM | POA: Insufficient documentation

## 2024-05-22 DIAGNOSIS — Z59868 Other specified financial insecurity: Secondary | ICD-10-CM | POA: Insufficient documentation

## 2024-05-22 DIAGNOSIS — N183 Chronic kidney disease, stage 3 unspecified: Secondary | ICD-10-CM | POA: Insufficient documentation

## 2024-05-22 MED ORDER — ROSUVASTATIN CALCIUM 5 MG PO TABS
5.0000 mg | ORAL_TABLET | Freq: Every day | ORAL | 3 refills | Status: AC
  Filled 2024-05-22: qty 90, 90d supply, fill #0

## 2024-06-01 ENCOUNTER — Other Ambulatory Visit (HOSPITAL_COMMUNITY): Payer: Self-pay

## 2024-06-02 ENCOUNTER — Other Ambulatory Visit: Payer: Self-pay | Admitting: Pharmacist

## 2024-06-17 ENCOUNTER — Ambulatory Visit: Payer: Self-pay | Admitting: Family

## 2024-06-25 ENCOUNTER — Ambulatory Visit: Payer: Self-pay | Admitting: Family

## 2024-06-25 ENCOUNTER — Encounter: Payer: Self-pay | Admitting: Family

## 2024-06-25 ENCOUNTER — Other Ambulatory Visit (HOSPITAL_COMMUNITY): Payer: Self-pay

## 2024-06-25 VITALS — BP 132/100 | HR 92 | Temp 98.2°F | Ht 68.0 in | Wt 201.4 lb

## 2024-06-25 DIAGNOSIS — Z6830 Body mass index (BMI) 30.0-30.9, adult: Secondary | ICD-10-CM

## 2024-06-25 DIAGNOSIS — E1165 Type 2 diabetes mellitus with hyperglycemia: Secondary | ICD-10-CM

## 2024-06-25 DIAGNOSIS — E1159 Type 2 diabetes mellitus with other circulatory complications: Secondary | ICD-10-CM

## 2024-06-25 DIAGNOSIS — Z7984 Long term (current) use of oral hypoglycemic drugs: Secondary | ICD-10-CM

## 2024-06-25 DIAGNOSIS — I152 Hypertension secondary to endocrine disorders: Secondary | ICD-10-CM

## 2024-06-25 DIAGNOSIS — Z794 Long term (current) use of insulin: Secondary | ICD-10-CM

## 2024-06-25 DIAGNOSIS — E669 Obesity, unspecified: Secondary | ICD-10-CM

## 2024-06-25 MED ORDER — INSULIN PEN NEEDLE 32G X 4 MM MISC
3 refills | Status: AC
  Filled 2024-06-25: qty 100, 25d supply, fill #0

## 2024-06-25 NOTE — Progress Notes (Signed)
 "  Patient ID: Gary Conway, male    DOB: [birth year], 49 y.o.   MRN: [id number]  Chief Complaint  Patient presents with   Uncontrolled type 2 diabetes mellitus with hyperglycemia    Type 2 diabetes mellitus with obesity  Discussed the use of AI scribe software for clinical note transcription with the patient, who gave verbal consent to proceed.  History of Present Illness Gary Conway is a 49 year old male with hypertension and diabetes who presents for a follow-up visit.  He takes amlodipine  in the morning, metoprolol , and valsartan  for blood pressure and denies dizziness. His diabetes appears well controlled with fasting blood sugars around 110 mg/dL and work readings around 120 mg/dL. He checks his blood sugar regularly. He uses insulin  32 units twice daily and metformin  once daily due to prior kidney concerns. He drinks about 2.5 liters of water daily. He takes Lasix  every other day without ankle or hand swelling or cough. He also takes rosuvastatin  and spironolactone . He is adherent to all medications and has no problems obtaining them from the pharmacy. He reports transportation challenges, especially with bad weather, and usually uses rideshare services to get to work.  Assessment & Plan Type 2 diabetes mellitus with obesity and hypertension Blood pressure elevated with high diastolic. Blood glucose well-controlled at home per report (no glucometer brought in). Weight decreased by 4 pounds. Current insulin  regimen effective. Metformin  dose adjusted for kidney concerns. Hydration maintained. Lasix , amlodipine , and valsartan  regimen stable. - Continue insulin  glargine 32 units twice daily. - Continue metformin  once daily. - Ensure hydration with 2.5 liters of water daily. - Continue Lasix  every other day. - Continue amlodipine  5mg   in the morning, consider moving to evening if dizziness occurs. - Continue valsartan  160mg  in the morning. - Monitor blood glucose levels and report  if falls below 80 mg/dL, or >849 or if feeling woozy. - Scheduled follow-up in 3 months for blood pressure and A1c re-evaluation.  Chronic kidney disease stage 3 Kidney function monitored with medication adjustments. Metformin  dose reduced for kidney concerns. Hydration emphasized. - Continue metformin  just once daily. - Ensure adequate hydration with 2.5 liters of water daily.  Subjective:    Outpatient Medications Prior to Visit  Medication Sig Dispense Refill   albuterol  (VENTOLIN  HFA) 108 (90 Base) MCG/ACT inhaler Inhale 2 puffs into the lungs every 6 (six) hours as needed for wheezing or shortness of breath. 6.7 g 5   amLODipine  (NORVASC ) 5 MG tablet Take 1 tablet (5 mg total) by mouth every evening. 30 tablet 5   blood glucose meter kit and supplies KIT Dispense based on patient and insurance preference. Use up to four times daily as directed. (FOR ICD-9 250.00, 250.01). For QAC - HS accuchecks. 1 each 1   Blood Glucose Monitoring Suppl (BLOOD GLUCOSE MONITOR SYSTEM) w/Device KIT Use to test blood sugar in the morning, at noon, and at bedtime. 1 kit 0   cyanocobalamin  (VITAMIN B12) 1000 MCG tablet Take 1 tablet (1,000 mcg total) by mouth daily. 30 tablet 5   fenofibrate  (TRICOR ) 145 MG tablet Take 1 tablet (145 mg total) by mouth daily. 30 tablet 5   ferrous sulfate  325 (65 FE) MG EC tablet Take 1 tablet (325 mg total) by mouth daily with breakfast. 30 tablet 5   folic acid  (FOLVITE ) 1 MG tablet Take 1 tablet (1 mg total) by mouth daily. 30 tablet 11   furosemide  (LASIX ) 40 MG tablet Take 1 tablet (40 mg total) by mouth every  morning. (Patient taking differently: Take 40 mg by mouth daily in the afternoon. In the afternoon) 30 tablet 5   Insulin  Glargine (BASAGLAR  KWIKPEN) 100 UNIT/ML Inject 32 Units into the skin 2 (two) times daily. 15 mL 5   Insulin  Pen Needle 32G X 4 MM MISC Use 4 times a day to inject insulin . 100 each 0   metFORMIN  (GLUCOPHAGE -XR) 500 MG 24 hr tablet Take 1 tablet  (500 mg total) by mouth 2 (two) times daily. 30 tablet 5   metoprolol  succinate (TOPROL -XL) 50 MG 24 hr tablet Take 1 tablet (50 mg total) by mouth every morning. 90 tablet 3   rosuvastatin  (CRESTOR ) 5 MG tablet Take 1 tablet (5 mg total) by mouth daily. 90 tablet 3   spironolactone  (ALDACTONE ) 25 MG tablet Take 1 tablet (25 mg total) by mouth daily. 30 tablet 5   valsartan  (DIOVAN ) 160 MG tablet Take 1 tablet (160 mg total) by mouth daily. 30 tablet 5   No facility-administered medications prior to visit.   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   Body mass index (BMI) 30.0-30.9, adult 12/12/2023   CAP (community acquired pneumonia) 03/22/2023   Diabetes mellitus, type II (HCC) 2015   Diagnosed during 2015 admission to [facility]   Diabetic ketoacidosis without coma associated with type 2 diabetes mellitus (HCC) 09/13/2019   Diffuse 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   Hemangioma of spine 2015   S/P excision at [facility] with pathology favoring benign Hemangioma   Influenza A 06/17/2023   Multifocal pneumonia 07/29/2023   Numbness and tingling of both legs 03/22/2014   Obesity 12/12/2023   Paraplegia (HCC) 03/22/2014   Severe sepsis (HCC) 03/22/2023   Spinal hemangioma 03/23/2014   Thoracic myelopathy 03/22/2014   Past Surgical History:  Procedure Laterality Date   LAMINECTOMY N/A 03/22/2014   Procedure: THORACIC two - four  LAMINECTOMY FOR TUMOR;  Surgeon: Victory DELENA Gunnels, MD;  Location: [facility];  Service: Neurosurgery;  Laterality: N/A;  Throacic two  - four laminectomy for tumor   LEG SURGERY     Allergies[1]     Objective:    Physical Exam Vitals and nursing note reviewed.  Constitutional:      General: He is not in acute distress.    Appearance: Normal appearance.  HENT:     Head: Normocephalic.  Cardiovascular:     Rate and Rhythm: Normal rate and regular rhythm.  Pulmonary:     Effort: Pulmonary effort is normal.     Breath sounds: Normal breath sounds.  Musculoskeletal:        General: Normal range of motion.     Cervical back: Normal range of motion.  Skin:    General: Skin is warm and dry.  Neurological:     Mental Status: He is alert and oriented to person, place, and time.  Psychiatric:        Mood and Affect: Mood normal.    BP (!) 132/100 (BP Location: Left Arm, Patient Position: Sitting, Cuff Size: Large)   Pulse 92   Temp 98.2 F (36.8 C) (Temporal)   Ht 5' 8 (1.727 m)   Wt 201 lb 6.4  oz (91.4 kg)   SpO2 98%   BMI 30.62 kg/m  Wt Readings from Last 3 Encounters:  06/25/24 201 lb 6.4 oz (91.4 kg)  05/20/24 205 lb 2 oz (93 kg)  03/26/24 200 lb 6.4 oz (90.9 kg)      Edelyn Heidel, NP     [1]  Allergies Allergen Reactions   Mustard Hives, Itching and Rash   "

## 2024-09-24 ENCOUNTER — Ambulatory Visit: Payer: Self-pay | Admitting: Family
